# Patient Record
Sex: Male | Born: 1955 | Race: White | Hispanic: No | Smoking: Former smoker
Health system: Southern US, Community
[De-identification: ages and names within clinical notes are randomized; demographics above are authoritative.]

## PROBLEM LIST (undated history)

## (undated) DIAGNOSIS — I2510 Atherosclerotic heart disease of native coronary artery without angina pectoris: Secondary | ICD-10-CM

## (undated) DIAGNOSIS — G4730 Sleep apnea, unspecified: Secondary | ICD-10-CM

## (undated) DIAGNOSIS — I509 Heart failure, unspecified: Secondary | ICD-10-CM

## (undated) DIAGNOSIS — I10 Essential (primary) hypertension: Secondary | ICD-10-CM

## (undated) DIAGNOSIS — E7800 Pure hypercholesterolemia, unspecified: Secondary | ICD-10-CM

## (undated) DIAGNOSIS — K2270 Barrett's esophagus without dysplasia: Secondary | ICD-10-CM

## (undated) DIAGNOSIS — F32A Depression, unspecified: Secondary | ICD-10-CM

## (undated) DIAGNOSIS — E039 Hypothyroidism, unspecified: Secondary | ICD-10-CM

## (undated) DIAGNOSIS — F329 Major depressive disorder, single episode, unspecified: Secondary | ICD-10-CM

## (undated) HISTORY — DX: Depression, unspecified: F32.A

## (undated) HISTORY — PX: TONSILLECTOMY: SUR1361

## (undated) HISTORY — DX: Essential (primary) hypertension: I10

## (undated) HISTORY — PX: COLONOSCOPY WITH ESOPHAGOGASTRODUODENOSCOPY (EGD): SHX5779

## (undated) HISTORY — PX: MEDIAL PARTIAL KNEE REPLACEMENT: SHX5965

## (undated) HISTORY — DX: Barrett's esophagus without dysplasia: K22.70

## (undated) HISTORY — PX: OTHER SURGICAL HISTORY: SHX169

## (undated) HISTORY — PX: COLONOSCOPY: SHX174

## (undated) HISTORY — DX: Pure hypercholesterolemia, unspecified: E78.00

## (undated) HISTORY — DX: Major depressive disorder, single episode, unspecified: F32.9

## (undated) HISTORY — DX: Hypothyroidism, unspecified: E03.9

---

## 2010-10-18 ENCOUNTER — Encounter: Payer: Self-pay | Admitting: Internal Medicine

## 2018-04-21 ENCOUNTER — Encounter (INDEPENDENT_AMBULATORY_CARE_PROVIDER_SITE_OTHER): Payer: Self-pay | Admitting: Internal Medicine

## 2018-04-21 ENCOUNTER — Ambulatory Visit (INDEPENDENT_AMBULATORY_CARE_PROVIDER_SITE_OTHER): Payer: BC Managed Care – PPO | Admitting: Internal Medicine

## 2018-04-21 ENCOUNTER — Encounter (INDEPENDENT_AMBULATORY_CARE_PROVIDER_SITE_OTHER): Payer: Self-pay | Admitting: *Deleted

## 2018-04-21 ENCOUNTER — Telehealth (INDEPENDENT_AMBULATORY_CARE_PROVIDER_SITE_OTHER): Payer: Self-pay | Admitting: *Deleted

## 2018-04-21 VITALS — BP 160/78 | HR 56 | Temp 97.9°F | Ht 70.0 in | Wt 253.8 lb

## 2018-04-21 DIAGNOSIS — K625 Hemorrhage of anus and rectum: Secondary | ICD-10-CM | POA: Insufficient documentation

## 2018-04-21 DIAGNOSIS — K2270 Barrett's esophagus without dysplasia: Secondary | ICD-10-CM | POA: Insufficient documentation

## 2018-04-21 DIAGNOSIS — Z86010 Personal history of colonic polyps: Secondary | ICD-10-CM | POA: Insufficient documentation

## 2018-04-21 HISTORY — DX: Barrett's esophagus without dysplasia: K22.70

## 2018-04-21 NOTE — Telephone Encounter (Signed)
Patient needs trilyte 

## 2018-04-21 NOTE — Patient Instructions (Signed)
The risks of bleeding, perforation and infection were reviewed with patient.  

## 2018-04-21 NOTE — Progress Notes (Signed)
   Subjective:    Patient ID: Tony Alvarez, male    DOB: Jun 07, 1956, 62 y.o.   MRN: [id number]      PCP Servando Salina FNP-C HPI Referred by [facility] for rectal bleeding. States he had rectal bleeding x 6 day and now has resolved. Saw the blood in the commode and on the toilet tissue. Denies any abdominal pain.  No change in his bowel habits. Appetite is good. No weight loss. No family hx of colon cancer.  Last colonoscopy was in 10/18/2010. Dr. Donnie Mesa. Diverticulosis. Three 3-24mm polyps in sigmoid colon.  One 4 mm polyp in rectum. One 50mm polyp in rectum.   Adenomatous. Internal hemorrhoids. Repeat  2016. 10/18/2010 EGD: Irregular z line, gastric polyp, gastric erythema. Barrett's  Married, no children. 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 Barrett's esophagus 04/21/2018  . Depression   . High cholesterol   . Hypertension   . Hypothyroid       No Known Allergies  Current Outpatient Medications on File Prior to Visit  Medication Sig Dispense Refill  . amLODipine (NORVASC) 5 MG tablet Take 5 mg by mouth daily.    Marland Kitchen buPROPion (WELLBUTRIN XL) 150 MG 24 hr tablet Take 150 mg by mouth daily.    Marland Kitchen escitalopram (LEXAPRO) 20 MG tablet Take 20 mg by mouth daily.    Marland Kitchen levothyroxine (SYNTHROID, LEVOTHROID) 125 MCG tablet Take 125 mcg by mouth daily before breakfast.    . losartan (COZAAR) 100 MG tablet Take 100 mg by mouth daily.    . pravastatin (PRAVACHOL) 20 MG tablet Take 20 mg by mouth daily.     No current facility-administered medications on file prior to visit.              Objective:   Physical Exam Blood pressure (!) 160/78, pulse (!) 56, temperature 97.9 F (36.6 C), height 5\' 10"  (1.778 m), weight 253 lb 12.8 oz (115.1 kg). Alert and oriented. Skin warm and dry. Oral mucosa is moist.   . Sclera anicteric, conjunctivae is pink. Thyroid not enlarged. No cervical  lymphadenopathy. Lungs clear. Heart regular rate and rhythm.  Abdomen is soft. Bowel sounds are positive. No hepatomegaly. No abdominal masses felt. No tenderness.  No edema to lower extremities.          Assessment & Plan:  Colon polyps adenomas per records. Needs surveillance. Rectal bleeding: Hx of Barrett's esophagus: Needs surveillance.

## 2018-04-22 MED ORDER — PEG 3350-KCL-NA BICARB-NACL 420 G PO SOLR: 4000.0000 mL | Freq: Once | ORAL | 0 refills | Status: AC

## 2018-04-28 ENCOUNTER — Ambulatory Visit (INDEPENDENT_AMBULATORY_CARE_PROVIDER_SITE_OTHER): Payer: BC Managed Care – PPO | Admitting: Internal Medicine

## 2018-05-24 ENCOUNTER — Encounter (HOSPITAL_COMMUNITY)
Admission: RE | Disposition: A | Discharge status: Home / Self Care | Payer: Self-pay | Source: Ambulatory Visit | Attending: Internal Medicine

## 2018-05-24 ENCOUNTER — Ambulatory Visit (HOSPITAL_COMMUNITY)
Admission: RE | Admit: 2018-05-24 | Discharge: 2018-05-24 | Disposition: A | Discharge status: Home / Self Care | Payer: BC Managed Care – PPO | Source: Ambulatory Visit | Attending: Internal Medicine | Admitting: Internal Medicine

## 2018-05-24 ENCOUNTER — Encounter (HOSPITAL_COMMUNITY): Payer: Self-pay | Admitting: *Deleted

## 2018-05-24 ENCOUNTER — Other Ambulatory Visit: Payer: Self-pay

## 2018-05-24 DIAGNOSIS — G4730 Sleep apnea, unspecified: Secondary | ICD-10-CM | POA: Insufficient documentation

## 2018-05-24 DIAGNOSIS — K635 Polyp of colon: Secondary | ICD-10-CM | POA: Insufficient documentation

## 2018-05-24 DIAGNOSIS — K2270 Barrett's esophagus without dysplasia: Secondary | ICD-10-CM | POA: Diagnosis not present

## 2018-05-24 DIAGNOSIS — K2970 Gastritis, unspecified, without bleeding: Secondary | ICD-10-CM | POA: Diagnosis not present

## 2018-05-24 DIAGNOSIS — F329 Major depressive disorder, single episode, unspecified: Secondary | ICD-10-CM | POA: Diagnosis not present

## 2018-05-24 DIAGNOSIS — Z79890 Hormone replacement therapy (postmenopausal): Secondary | ICD-10-CM | POA: Diagnosis not present

## 2018-05-24 DIAGNOSIS — K210 Gastro-esophageal reflux disease with esophagitis: Secondary | ICD-10-CM | POA: Diagnosis not present

## 2018-05-24 DIAGNOSIS — K317 Polyp of stomach and duodenum: Secondary | ICD-10-CM | POA: Insufficient documentation

## 2018-05-24 DIAGNOSIS — D125 Benign neoplasm of sigmoid colon: Secondary | ICD-10-CM | POA: Diagnosis not present

## 2018-05-24 DIAGNOSIS — I10 Essential (primary) hypertension: Secondary | ICD-10-CM | POA: Insufficient documentation

## 2018-05-24 DIAGNOSIS — E039 Hypothyroidism, unspecified: Secondary | ICD-10-CM | POA: Insufficient documentation

## 2018-05-24 DIAGNOSIS — Z13810 Encounter for screening for upper gastrointestinal disorder: Secondary | ICD-10-CM | POA: Diagnosis present

## 2018-05-24 DIAGNOSIS — Z8719 Personal history of other diseases of the digestive system: Secondary | ICD-10-CM | POA: Insufficient documentation

## 2018-05-24 DIAGNOSIS — Z79899 Other long term (current) drug therapy: Secondary | ICD-10-CM | POA: Insufficient documentation

## 2018-05-24 DIAGNOSIS — Z860100 Personal history of colon polyps, unspecified: Secondary | ICD-10-CM

## 2018-05-24 DIAGNOSIS — K5140 Inflammatory polyps of colon without complications: Secondary | ICD-10-CM | POA: Insufficient documentation

## 2018-05-24 DIAGNOSIS — K625 Hemorrhage of anus and rectum: Secondary | ICD-10-CM

## 2018-05-24 DIAGNOSIS — K228 Other specified diseases of esophagus: Secondary | ICD-10-CM | POA: Diagnosis not present

## 2018-05-24 DIAGNOSIS — K644 Residual hemorrhoidal skin tags: Secondary | ICD-10-CM | POA: Insufficient documentation

## 2018-05-24 DIAGNOSIS — F17210 Nicotine dependence, cigarettes, uncomplicated: Secondary | ICD-10-CM | POA: Insufficient documentation

## 2018-05-24 DIAGNOSIS — E7800 Pure hypercholesterolemia, unspecified: Secondary | ICD-10-CM | POA: Diagnosis not present

## 2018-05-24 HISTORY — PX: POLYPECTOMY: SHX5525

## 2018-05-24 HISTORY — DX: Sleep apnea, unspecified: G47.30

## 2018-05-24 HISTORY — PX: BIOPSY: SHX5522

## 2018-05-24 HISTORY — PX: COLONOSCOPY: SHX5424

## 2018-05-24 HISTORY — PX: ESOPHAGOGASTRODUODENOSCOPY: SHX5428

## 2018-05-24 SURGERY — COLONOSCOPY
Anesthesia: Moderate Sedation

## 2018-05-24 MED ORDER — MIDAZOLAM HCL 5 MG/5ML IJ SOLN
INTRAMUSCULAR | Status: DC | PRN
  Administered 2018-05-24: 2 mg via INTRAVENOUS
  Administered 2018-05-24: 3 mg via INTRAVENOUS
  Administered 2018-05-24: 2 mg via INTRAVENOUS

## 2018-05-24 MED ORDER — MIDAZOLAM HCL 5 MG/5ML IJ SOLN
INTRAMUSCULAR | Status: AC
  Filled 2018-05-24: qty 10

## 2018-05-24 MED ORDER — STERILE WATER FOR IRRIGATION IR SOLN
Status: DC | PRN
  Administered 2018-05-24: 09:00:00

## 2018-05-24 MED ORDER — SODIUM CHLORIDE 0.9 % IV SOLN
INTRAVENOUS | Status: DC
  Administered 2018-05-24: 08:00:00 via INTRAVENOUS

## 2018-05-24 MED ORDER — LIDOCAINE VISCOUS HCL 2 % MT SOLN
OROMUCOSAL | Status: AC
  Filled 2018-05-24: qty 15

## 2018-05-24 MED ORDER — LIDOCAINE VISCOUS HCL 2 % MT SOLN
OROMUCOSAL | Status: DC | PRN
  Administered 2018-05-24: 4 mL via OROMUCOSAL

## 2018-05-24 MED ORDER — PANTOPRAZOLE SODIUM 40 MG PO TBEC: 40.0000 mg | DELAYED_RELEASE_TABLET | Freq: Every day | ORAL | 5 refills | Status: DC

## 2018-05-24 MED ORDER — MEPERIDINE HCL 50 MG/ML IJ SOLN
INTRAMUSCULAR | Status: DC | PRN
  Administered 2018-05-24 (×2): 25 mg via INTRAVENOUS

## 2018-05-24 MED ORDER — MEPERIDINE HCL 50 MG/ML IJ SOLN
INTRAMUSCULAR | Status: AC
  Filled 2018-05-24: qty 1

## 2018-05-24 NOTE — Op Note (Addendum)
[facility] Patient Name: Tony Alvarez Procedure Date: 05/24/2018 8:31 AM MRN: [id number] Date of Birth: [birth year] Attending MD: Hildred Laser , MD CSN: [id number] Age: 62 Admit Type: Outpatient Procedure:                Upper GI endoscopy Indications:              Follow-up of Barrett's esophagus Providers:                Hildred Laser, MD, Otis Peak B. Sharon Seller, RN, Nelma Rothman, Technician Referring MD:             Armstead Peaks, FNP Medicines:                Lidocaine spray, Meperidine 50 mg IV, Midazolam 7                            mg IV Complications:            No immediate complications. Estimated Blood Loss:     Estimated blood loss was minimal. Procedure:                Pre-Anesthesia Assessment:                           - Prior to the procedure, a History and Physical                            was performed, and patient medications and                            allergies were reviewed. The patient's tolerance of                            previous anesthesia was also reviewed. The risks                            and benefits of the procedure and the sedation                            options and risks were discussed with the patient.                            All questions were answered, and informed consent                            was obtained. Prior Anticoagulants: The patient has                            taken no previous anticoagulant or antiplatelet                            agents. ASA Grade Assessment: II - A patient with  mild systemic disease. After reviewing the risks                            and benefits, the patient was deemed in                            satisfactory condition to undergo the procedure.                           After obtaining informed consent, the endoscope was                            passed under direct vision. Throughout the                            procedure, the  patient's blood pressure, pulse, and                            oxygen saturations were monitored continuously. The                            GIF-H190 ([id number]) was introduced through the                            mouth, and advanced to the second part of duodenum.                            The upper GI endoscopy was accomplished without                            difficulty. The patient tolerated the procedure                            well. Scope In: 6:21:30 AM Scope Out: 9:01:10 AM Total Procedure Duration: 0 hours 15 minutes 2 seconds  Findings:      The upper third of the esophagus and middle third of the esophagus were       normal.      LA Grade B (one or more mucosal breaks greater than 5 mm, not extending       between the tops of two mucosal folds) esophagitis was found 43 to 45 cm       from the incisors.      The Z-line was irregular and was found 45 cm from the incisors. Biopsies       were taken with a cold forceps for histology. The pathology specimen was       placed into Bottle Number 2.      Three small sessile polyps were found in the gastric body and in the       gastric antrum. Biopsies were taken with a cold forceps for histology.       The pathology specimen was placed into Bottle Number 1.      Patchy mild inflammation characterized by congestion (edema) and       erythema was found in the gastric body and in the gastric antrum.      The exam of  the stomach was otherwise normal.      The duodenal bulb and second portion of the duodenum were normal. Impression:               - Normal upper third of esophagus and middle third                            of esophagus.                           - LA Grade B reflux esophagitis.                           - Z-line irregular with edema and erythema at 45 cm                            from the incisors. Biopsied.                           - Three gastric polyps. Biopsied.                           - Gastritis.                            - Normal duodenal bulb and second portion of the                            duodenum. Moderate Sedation:      Moderate (conscious) sedation was administered by the endoscopy nurse       and supervised by the endoscopist. The following parameters were       monitored: oxygen saturation, heart rate, blood pressure, CO2       capnography and response to care. Total physician intraservice time was       22 minutes. Recommendation:           - Patient has a contact number available for                            emergencies. The signs and symptoms of potential                            delayed complications were discussed with the                            patient. Return to normal activities tomorrow.                            Written discharge instructions were provided to the                            patient.                           - Resume previous diet today.                           -  Continue present medications.                           - Use Protonix (pantoprazole) 40 mg PO daily today.                           - Await pathology results.                           - Anti-reflux measures. Procedure Code(s):        --- Professional ---                           [phone], Esophagogastroduodenoscopy, flexible,                            transoral; with biopsy, single or multiple                           G0500, Moderate sedation services provided by the                            same physician or other qualified health care                            professional performing a gastrointestinal                            endoscopic service that sedation supports,                            requiring the presence of an independent trained                            observer to assist in the monitoring of the                            patient's level of consciousness and physiological                            status; initial 15 minutes of intra-service time;                             patient age 77 years or older (additional time may                            be reported with [phone], as appropriate) Diagnosis Code(s):        --- Professional ---                           K22.70, Barrett's esophagus without dysplasia                           K21.0, Gastro-esophageal reflux disease with  esophagitis                           K22.8, Other specified diseases of esophagus                           K31.7, Polyp of stomach and duodenum                           K29.70, Gastritis, unspecified, without bleeding CPT copyright 2017 American Medical Association. All rights reserved. The codes documented in this report are preliminary and upon coder review may  be revised to meet current compliance requirements. Hildred Laser, MD Hildred Laser, MD 05/24/2018 9:49:36 AM This report has been signed electronically. Number of Addenda: 0

## 2018-05-24 NOTE — H&P (Signed)
Tony Alvarez is an 62 y.o. male.   Chief Complaint: Patient is here for EGD and colonoscopy. HPI: Patient is 62 year old Caucasian male with history of Barrett's esophagus who is here for surveillance EGD.  He presently does not take any medicine for acid suppression.  He is watching his diet.  He denies nausea vomiting or dysphagia.  Last month he had 6 days of rectal bleeding occurring with his bowel movement and without his bowel movements.  He passed moderate amount of fresh blood each time.  He did not have diarrhea no constipation.  He is back to having normal stools.  Last colonoscopy was in December 2011 with removal of 3 small polyps.  He has not had a follow-up exam. Family history is negative for CRC.  Past Medical History:  Diagnosis Date  . Barrett's esophagus 04/21/2018  . Depression   . High cholesterol   . Hypertension   . Hypothyroid   . Sleep apnea     Past Surgical History:  Procedure Laterality Date  . COLONOSCOPY    . COLONOSCOPY WITH ESOPHAGOGASTRODUODENOSCOPY (EGD)    . MEDIAL PARTIAL KNEE REPLACEMENT     2015  . rt hand injury    . shoulder surgery for bursitis    . TONSILLECTOMY      Family History  Problem Relation Age of Onset  . Colon cancer Neg Hx    Social History:  reports that he has been smoking cigarettes.  He has a 10.00 pack-year smoking history. He has never used smokeless tobacco. He reports that he does not drink alcohol or use drugs.  Allergies: No Known Allergies  Medications Prior to Admission  Medication Sig Dispense Refill  . amLODipine (NORVASC) 5 MG tablet Take 5 mg by mouth daily.    Marland Kitchen buPROPion (WELLBUTRIN SR) 150 MG 12 hr tablet Take 150-300 mg by mouth See admin instructions. Take 300mg  by mouth in the morning and 150mg  at lunch  0  . escitalopram (LEXAPRO) 20 MG tablet Take 20 mg by mouth daily.    Marland Kitchen levothyroxine (SYNTHROID, LEVOTHROID) 125 MCG tablet Take 125 mcg by mouth daily before breakfast.    . losartan (COZAAR) 100 MG  tablet Take 100 mg by mouth daily.    . pravastatin (PRAVACHOL) 20 MG tablet Take 20 mg by mouth daily.      No results found for this or any previous visit (from the past 48 hour(s)). No results found.  ROS  Blood pressure 136/77, pulse 69, temperature 98.1 F (36.7 C), temperature source Oral, resp. rate 13, height 5\' 10"  (1.778 m), weight 250 lb (113.4 kg), SpO2 96 %. Physical Exam  Constitutional: He appears well-developed and well-nourished.  HENT:  Mouth/Throat: Oropharynx is clear and moist.  Eyes: Conjunctivae are normal. No scleral icterus.  Neck: No thyromegaly present.  Cardiovascular: Normal rate, regular rhythm and normal heart sounds.  No murmur heard. Respiratory: Effort normal and breath sounds normal.  GI: Soft. He exhibits no distension and no mass. There is no tenderness.  Musculoskeletal: He exhibits no edema.  Lymphadenopathy:    He has no cervical adenopathy.  Neurological: He is alert.  Skin: Skin is warm and dry.     Assessment/Plan History of Barrett's esophagus. Rectal bleeding.  History of colonic polyps.  Path not available. Surveillance EGD and diagnostic colonoscopy.  Hildred Laser, MD 05/24/2018, 8:35 AM

## 2018-05-24 NOTE — Op Note (Signed)
[facility] Patient Name: Tony Alvarez Procedure Date: 05/24/2018 9:06 AM MRN: [id number] Date of Birth: February 14, 1956 Attending MD: Hildred Laser , MD CSN: [id number] Age: 62 Admit Type: Outpatient Procedure:                Colonoscopy Indications:              Rectal bleeding Providers:                Hildred Laser, MD, Jeanann Lewandowsky. Sharon Seller, RN, Nelma Rothman, Technician Referring MD:             Maple Hudson, FNP Medicines:                None other than what was given for EGD. Complications:            No immediate complications. Estimated Blood Loss:     Estimated blood loss was minimal. Procedure:                Pre-Anesthesia Assessment:                           - Prior to the procedure, a History and Physical                            was performed, and patient medications and                            allergies were reviewed. The patient's tolerance of                            previous anesthesia was also reviewed. The risks                            and benefits of the procedure and the sedation                            options and risks were discussed with the patient.                            All questions were answered, and informed consent                            was obtained. Prior Anticoagulants: The patient has                            taken no previous anticoagulant or antiplatelet                            agents. ASA Grade Assessment: II - A patient with                            mild systemic disease. After reviewing the risks  and benefits, the patient was deemed in                            satisfactory condition to undergo the procedure.                           After obtaining informed consent, the colonoscope                            was passed under direct vision. Throughout the                            procedure, the patient's blood pressure, pulse, and   oxygen saturations were monitored continuously. The                            PCF-H190DL ([id number]) scope was introduced through                            the anus and advanced to the the cecum, identified                            by appendiceal orifice and ileocecal valve. The                            colonoscopy was performed without difficulty. The                            patient tolerated the procedure well. The quality                            of the bowel preparation was excellent. The                            ileocecal valve, appendiceal orifice, and rectum                            were photographed. Scope In: 9:08:26 AM Scope Out: 9:33:42 AM Scope Withdrawal Time: 0 hours 17 minutes 37 seconds  Total Procedure Duration: 0 hours 25 minutes 16 seconds  Findings:      The perianal and digital rectal examinations were normal.      A small polyp was found in the distal sigmoid colon. The polyp was       sessile. Biopsies were taken with a cold forceps for histology. The       pathology specimen was placed into Bottle Number 3.      The exam was otherwise normal throughout the examined colon.      External hemorrhoids were found during retroflexion. The hemorrhoids       were small. Impression:               - One small polyp in the distal sigmoid colon.                            Biopsied.                           -  External hemorrhoids. Moderate Sedation:      Moderate (conscious) sedation was administered by the endoscopy nurse       and supervised by the endoscopist. The following parameters were       monitored: oxygen saturation, heart rate, blood pressure, CO2       capnography and response to care. Total physician intraservice time was       34 minutes. Recommendation:           - Patient has a contact number available for                            emergencies. The signs and symptoms of potential                            delayed complications were discussed  with the                            patient. Return to normal activities tomorrow.                            Written discharge instructions were provided to the                            patient.                           - Resume previous diet today.                           - Continue present medications.                           - Await pathology results.                           - Repeat colonoscopy is recommended. The                            colonoscopy date will be determined after pathology                            results from today's exam become available for                            review. Procedure Code(s):        --- Professional ---                           [phone], Colonoscopy, flexible; with biopsy, single                            or multiple                           G0500, Moderate sedation services provided by the  same physician or other qualified health care                            professional performing a gastrointestinal                            endoscopic service that sedation supports,                            requiring the presence of an independent trained                            observer to assist in the monitoring of the                            patient's level of consciousness and physiological                            status; initial 15 minutes of intra-service time;                            patient age 87 years or older (additional time may                            be reported with [phone], as appropriate)                           [phone], Moderate sedation services provided by the                            same physician or other qualified health care                            professional performing the diagnostic or                            therapeutic service that the sedation supports,                            requiring the presence of an independent trained                             observer to assist in the monitoring of the                            patient's level of consciousness and physiological                            status; each additional 15 minutes intraservice                            time (List separately in addition to code for  primary service) Diagnosis Code(s):        --- Professional ---                           D12.5, Benign neoplasm of sigmoid colon                           K64.4, Residual hemorrhoidal skin tags                           K62.5, Hemorrhage of anus and rectum CPT copyright 2017 American Medical Association. All rights reserved. The codes documented in this report are preliminary and upon coder review may  be revised to meet current compliance requirements. Hildred Laser, MD Hildred Laser, MD 05/24/2018 9:53:04 AM This report has been signed electronically. Number of Addenda: 0

## 2018-05-24 NOTE — Discharge Instructions (Signed)
Hemorrhoids °Hemorrhoids are swollen veins in and around the rectum or anus. There are two types of hemorrhoids: °· Internal hemorrhoids. These occur in the veins that are just inside the rectum. They may poke through to the outside and become irritated and painful. °· External hemorrhoids. These occur in the veins that are outside of the anus and can be felt as a painful swelling or hard lump near the anus. ° °Most hemorrhoids do not cause serious problems, and they can be managed with home treatments such as diet and lifestyle changes. If home treatments do not help your symptoms, procedures can be done to shrink or remove the hemorrhoids. °What are the causes? °This condition is caused by increased pressure in the anal area. This pressure may result from various things, including: °· Constipation. °· Straining to have a bowel movement. °· Diarrhea. °· Pregnancy. °· Obesity. °· Sitting for long periods of time. °· Heavy lifting or other activity that causes you to strain. °· Anal sex. ° °What are the signs or symptoms? °Symptoms of this condition include: °· Pain. °· Anal itching or irritation. °· Rectal bleeding. °· Leakage of stool (feces). °· Anal swelling. °· One or more lumps around the anus. ° °How is this diagnosed? °This condition can often be diagnosed through a visual exam. Other exams or tests may also be done, such as: °· Examination of the rectal area with a gloved hand (digital rectal exam). °· Examination of the anal canal using a small tube (anoscope). °· A blood test, if you have lost a significant amount of blood. °· A test to look inside the colon (sigmoidoscopy or colonoscopy). ° °How is this treated? °This condition can usually be treated at home. However, various procedures may be done if dietary changes, lifestyle changes, and other home treatments do not help your symptoms. These procedures can help make the hemorrhoids smaller or remove them completely. Some of these procedures involve  surgery, and others do not. Common procedures include: °· Rubber band ligation. Rubber bands are placed at the base of the hemorrhoids to cut off the blood supply to them. °· Sclerotherapy. Medicine is injected into the hemorrhoids to shrink them. °· Infrared coagulation. A type of light energy is used to get rid of the hemorrhoids. °· Hemorrhoidectomy surgery. The hemorrhoids are surgically removed, and the veins that supply them are tied off. °· Stapled hemorrhoidopexy surgery. A circular stapling device is used to remove the hemorrhoids and use staples to cut off the blood supply to them. ° °Follow these instructions at home: °Eating and drinking °· Eat foods that have a lot of fiber in them, such as whole grains, beans, nuts, fruits, and vegetables. Ask your health care provider about taking products that have added fiber (fiber supplements). °· Drink enough fluid to keep your urine clear or pale yellow. °Managing pain and swelling °· Take warm sitz baths for 20 minutes, 3-4 times a day to ease pain and discomfort. °· If directed, apply ice to the affected area. Using ice packs between sitz baths may be helpful. °? Put ice in a plastic bag. °? Place a towel between your skin and the bag. °? Leave the ice on for 20 minutes, 2-3 times a day. °General instructions °· Take over-the-counter and prescription medicines only as told by your health care provider. °· Use medicated creams or suppositories as told. °· Exercise regularly. °· Go to the bathroom when you have the urge to have a bowel movement. Do not wait. °·   Avoid straining to have bowel movements.  Keep the anal area dry and clean. Use wet toilet paper or moist towelettes after a bowel movement.  Do not sit on the toilet for long periods of time. This increases blood pooling and pain. Contact a health care provider if:  You have increasing pain and swelling that are not controlled by treatment or medicine.  You have uncontrolled bleeding.  You  have difficulty having a bowel movement, or you are unable to have a bowel movement.  You have pain or inflammation outside the area of the hemorrhoids. This information is not intended to replace advice given to you by your health care provider. Make sure you discuss any questions you have with your health care provider. Document Released: 10/24/2000 Document Revised: 03/26/2016 Document Reviewed: 07/11/2015 Elsevier Interactive Patient Education  2018 [employer]. Esophagogastroduodenoscopy Esophagogastroduodenoscopy (EGD) is a procedure to examine the lining of the esophagus, stomach, and first part of the small intestine (duodenum). This procedure is done to check for problems such as inflammation, bleeding, ulcers, or growths. During this procedure, a long, flexible, lighted tube with a camera attached (endoscope) is inserted down the throat. Tell a health care provider about:  Any allergies you have.  All medicines you are taking, including vitamins, herbs, eye drops, creams, and over-the-counter medicines.  Any problems you or family members have had with anesthetic medicines.  Any blood disorders you have.  Any surgeries you have had.  Any medical conditions you have.  Whether you are pregnant or may be pregnant. What are the risks? Generally, this is a safe procedure. However, problems may occur, including:  Infection.  Bleeding.  A tear (perforation) in the esophagus, stomach, or duodenum.  Trouble breathing.  Excessive sweating.  Spasms of the larynx.  A slowed heartbeat.  Low blood pressure.  What happens before the procedure?  Follow instructions from your health care provider about eating or drinking restrictions.  Ask your health care provider about: ? Changing or stopping your regular medicines. This is especially important if you are taking diabetes medicines or blood thinners. ? Taking medicines such as aspirin and ibuprofen. These medicines can  thin your blood. Do not take these medicines before your procedure if your health care provider instructs you not to.  Plan to have someone take you home after the procedure.  If you wear dentures, be ready to remove them before the procedure. What happens during the procedure?  To reduce your risk of infection, your health care team will wash or sanitize their hands.  An IV tube will be put in a vein in your hand or arm. You will get medicines and fluids through this tube.  You will be given one or more of the following: ? A medicine to help you relax (sedative). ? A medicine to numb the area (local anesthetic). This medicine may be sprayed into your throat. It will make you feel more comfortable and keep you from gagging or coughing during the procedure. ? A medicine for pain.  A mouth guard may be placed in your mouth to protect your teeth and to keep you from biting on the endoscope.  You will be asked to lie on your left side.  The endoscope will be lowered down your throat into your esophagus, stomach, and duodenum.  Air will be put into the endoscope. This will help your health care provider see better.  The lining of your esophagus, stomach, and duodenum will be examined.  Your health  care provider may: ? Take a tissue sample so it can be looked at in a lab (biopsy). ? Remove growths. ? Remove objects (foreign bodies) that are stuck. ? Treat any bleeding with medicines or other devices that stop tissue from bleeding. ? Widen (dilate) or stretch narrowed areas of your esophagus and stomach.  The endoscope will be taken out. The procedure may vary among health care providers and hospitals. What happens after the procedure?  Your blood pressure, heart rate, breathing rate, and blood oxygen level will be monitored often until the medicines you were given have worn off.  Do not eat or drink anything until the numbing medicine has worn off and your gag reflex has  returned. This information is not intended to replace advice given to you by your health care provider. Make sure you discuss any questions you have with your health care provider. Document Released: 02/27/2005 Document Revised: 04/03/2016 Document Reviewed: 09/20/2015 Elsevier Interactive Patient Education  2018 [employer]. Colonoscopy, Adult, Care After This sheet gives you information about how to care for yourself after your procedure. Your health care provider may also give you more specific instructions. If you have problems or questions, contact your health care provider. What can I expect after the procedure? After the procedure, it is common to have:  A small amount of blood in your stool for 24 hours after the procedure.  Some gas.  Mild abdominal cramping or bloating.  Follow these instructions at home: General instructions   For the first 24 hours after the procedure: ? Do not drive or use machinery. ? Do not sign important documents. ? Do not drink alcohol. ? Do your regular daily activities at a slower pace than normal. ? Eat soft, easy-to-digest foods. ? Rest often.  Take over-the-counter or prescription medicines only as told by your health care provider.  It is up to you to get the results of your procedure. Ask your health care provider, or the department performing the procedure, when your results will be ready. Relieving cramping and bloating  Try walking around when you have cramps or feel bloated.  Apply heat to your abdomen as told by your health care provider. Use a heat source that your health care provider recommends, such as a moist heat pack or a heating pad. ? Place a towel between your skin and the heat source. ? Leave the heat on for 20-30 minutes. ? Remove the heat if your skin turns bright red. This is especially important if you are unable to feel pain, heat, or cold. You may have a greater risk of getting burned. Eating and drinking  Drink  enough fluid to keep your urine clear or pale yellow.  Resume your normal diet as instructed by your health care provider. Avoid heavy or fried foods that are hard to digest.  Avoid drinking alcohol for as long as instructed by your health care provider. Contact a health care provider if:  You have blood in your stool 2-3 days after the procedure. Get help right away if:  You have more than a small spotting of blood in your stool.  You pass large blood clots in your stool.  Your abdomen is swollen.  You have nausea or vomiting.  You have a fever.  You have increasing abdominal pain that is not relieved with medicine. This information is not intended to replace advice given to you by your health care provider. Make sure you discuss any questions you have with your health  care provider. Document Released: 06/10/2004 Document Revised: 07/21/2016 Document Reviewed: 01/08/2016 Elsevier Interactive Patient Education  2018 [employer]. No aspirin or NSAIDs for 24 hours. Resume usual medications as before. Pantoprazole 40 mg by mouth 30 minutes before breakfast daily. Anti-reflux measures. No driving for 24 hours. Physician will call with biopsy results.

## 2018-05-28 ENCOUNTER — Encounter (HOSPITAL_COMMUNITY): Payer: Self-pay | Admitting: Internal Medicine

## 2019-01-26 ENCOUNTER — Encounter (HOSPITAL_COMMUNITY): Payer: Self-pay

## 2019-01-26 ENCOUNTER — Inpatient Hospital Stay (HOSPITAL_COMMUNITY)
Admission: EM | Admit: 2019-01-26 | Discharge: 2019-01-30 | DRG: 246 | Disposition: A | Discharge status: Home Health | Payer: BC Managed Care – PPO | Attending: Cardiovascular Disease | Admitting: Cardiovascular Disease

## 2019-01-26 ENCOUNTER — Encounter (HOSPITAL_COMMUNITY)
Admission: EM | Disposition: A | Discharge status: Home Health | Payer: Self-pay | Source: Home / Self Care | Attending: Cardiovascular Disease

## 2019-01-26 ENCOUNTER — Other Ambulatory Visit: Payer: Self-pay

## 2019-01-26 ENCOUNTER — Emergency Department (HOSPITAL_COMMUNITY): Payer: BC Managed Care – PPO

## 2019-01-26 DIAGNOSIS — I2102 ST elevation (STEMI) myocardial infarction involving left anterior descending coronary artery: Principal | ICD-10-CM | POA: Diagnosis present

## 2019-01-26 DIAGNOSIS — I5023 Acute on chronic systolic (congestive) heart failure: Secondary | ICD-10-CM

## 2019-01-26 DIAGNOSIS — K2270 Barrett's esophagus without dysplasia: Secondary | ICD-10-CM | POA: Diagnosis not present

## 2019-01-26 DIAGNOSIS — I10 Essential (primary) hypertension: Secondary | ICD-10-CM

## 2019-01-26 DIAGNOSIS — I2510 Atherosclerotic heart disease of native coronary artery without angina pectoris: Secondary | ICD-10-CM | POA: Diagnosis not present

## 2019-01-26 DIAGNOSIS — E782 Mixed hyperlipidemia: Secondary | ICD-10-CM | POA: Diagnosis present

## 2019-01-26 DIAGNOSIS — I5021 Acute systolic (congestive) heart failure: Secondary | ICD-10-CM | POA: Diagnosis present

## 2019-01-26 DIAGNOSIS — R634 Abnormal weight loss: Secondary | ICD-10-CM | POA: Diagnosis present

## 2019-01-26 DIAGNOSIS — F10231 Alcohol dependence with withdrawal delirium: Secondary | ICD-10-CM

## 2019-01-26 DIAGNOSIS — I2129 ST elevation (STEMI) myocardial infarction involving other sites: Secondary | ICD-10-CM

## 2019-01-26 DIAGNOSIS — I255 Ischemic cardiomyopathy: Secondary | ICD-10-CM

## 2019-01-26 DIAGNOSIS — Z7982 Long term (current) use of aspirin: Secondary | ICD-10-CM | POA: Diagnosis not present

## 2019-01-26 DIAGNOSIS — E039 Hypothyroidism, unspecified: Secondary | ICD-10-CM | POA: Diagnosis present

## 2019-01-26 DIAGNOSIS — E785 Hyperlipidemia, unspecified: Secondary | ICD-10-CM | POA: Diagnosis not present

## 2019-01-26 DIAGNOSIS — F1010 Alcohol abuse, uncomplicated: Secondary | ICD-10-CM

## 2019-01-26 DIAGNOSIS — I110 Hypertensive heart disease with heart failure: Secondary | ICD-10-CM | POA: Diagnosis present

## 2019-01-26 DIAGNOSIS — Z79899 Other long term (current) drug therapy: Secondary | ICD-10-CM | POA: Diagnosis not present

## 2019-01-26 DIAGNOSIS — G4730 Sleep apnea, unspecified: Secondary | ICD-10-CM | POA: Diagnosis present

## 2019-01-26 DIAGNOSIS — Z79890 Hormone replacement therapy (postmenopausal): Secondary | ICD-10-CM

## 2019-01-26 DIAGNOSIS — Z96659 Presence of unspecified artificial knee joint: Secondary | ICD-10-CM | POA: Diagnosis present

## 2019-01-26 DIAGNOSIS — R410 Disorientation, unspecified: Secondary | ICD-10-CM | POA: Diagnosis present

## 2019-01-26 DIAGNOSIS — R748 Abnormal levels of other serum enzymes: Secondary | ICD-10-CM

## 2019-01-26 DIAGNOSIS — Z634 Disappearance and death of family member: Secondary | ICD-10-CM | POA: Diagnosis not present

## 2019-01-26 DIAGNOSIS — Z86010 Personal history of colonic polyps: Secondary | ICD-10-CM

## 2019-01-26 DIAGNOSIS — F329 Major depressive disorder, single episode, unspecified: Secondary | ICD-10-CM | POA: Diagnosis present

## 2019-01-26 DIAGNOSIS — F10931 Alcohol use, unspecified with withdrawal delirium: Secondary | ICD-10-CM

## 2019-01-26 DIAGNOSIS — I213 ST elevation (STEMI) myocardial infarction of unspecified site: Secondary | ICD-10-CM | POA: Diagnosis not present

## 2019-01-26 DIAGNOSIS — F17210 Nicotine dependence, cigarettes, uncomplicated: Secondary | ICD-10-CM | POA: Diagnosis present

## 2019-01-26 DIAGNOSIS — M549 Dorsalgia, unspecified: Secondary | ICD-10-CM | POA: Diagnosis present

## 2019-01-26 DIAGNOSIS — G9340 Encephalopathy, unspecified: Secondary | ICD-10-CM | POA: Diagnosis not present

## 2019-01-26 HISTORY — PX: CORONARY/GRAFT ACUTE MI REVASCULARIZATION: CATH118305

## 2019-01-26 HISTORY — PX: LEFT HEART CATH AND CORONARY ANGIOGRAPHY: CATH118249

## 2019-01-26 HISTORY — PX: CORONARY STENT INTERVENTION: CATH118234

## 2019-01-26 LAB — CBC WITH DIFFERENTIAL/PLATELET
ABS IMMATURE GRANULOCYTES: 0.09 10*3/uL — AB (ref 0.00–0.07)
Basophils Absolute: 0.1 10*3/uL (ref 0.0–0.1)
Basophils Relative: 1 %
Eosinophils Absolute: 0.1 10*3/uL (ref 0.0–0.5)
Eosinophils Relative: 1 %
HCT: 50.8 % (ref 39.0–52.0)
Hemoglobin: 17 g/dL (ref 13.0–17.0)
Immature Granulocytes: 1 %
Lymphocytes Relative: 12 %
Lymphs Abs: 1.8 10*3/uL (ref 0.7–4.0)
MCH: 31 pg (ref 26.0–34.0)
MCHC: 33.5 g/dL (ref 30.0–36.0)
MCV: 92.5 fL (ref 80.0–100.0)
MONO ABS: 0.6 10*3/uL (ref 0.1–1.0)
Monocytes Relative: 4 %
NEUTROS ABS: 12.6 10*3/uL — AB (ref 1.7–7.7)
NEUTROS PCT: 81 %
PLATELETS: 231 10*3/uL (ref 150–400)
RBC: 5.49 MIL/uL (ref 4.22–5.81)
RDW: 13.5 % (ref 11.5–15.5)
WBC: 15.3 10*3/uL — ABNORMAL HIGH (ref 4.0–10.5)
nRBC: 0.1 % (ref 0.0–0.2)

## 2019-01-26 LAB — I-STAT TROPONIN, ED: Troponin i, poc: 3.16 ng/mL (ref 0.00–0.08)

## 2019-01-26 LAB — LIPID PANEL
CHOLESTEROL: 209 mg/dL — AB (ref 0–200)
HDL: 37 mg/dL — ABNORMAL LOW (ref >40)
LDL Cholesterol: 134 mg/dL — ABNORMAL HIGH (ref 0–99)
Total CHOL/HDL Ratio: 5.6 RATIO
Triglycerides: 190 mg/dL — ABNORMAL HIGH (ref <150)
VLDL: 38 mg/dL (ref 0–40)

## 2019-01-26 LAB — COMPREHENSIVE METABOLIC PANEL
ALT: 45 U/L — AB (ref 0–44)
ANION GAP: 12 (ref 5–15)
AST: 66 U/L — ABNORMAL HIGH (ref 15–41)
Albumin: 4.9 g/dL (ref 3.5–5.0)
Alkaline Phosphatase: 67 U/L (ref 38–126)
BUN: 14 mg/dL (ref 6–20)
CALCIUM: 10 mg/dL (ref 8.9–10.3)
CO2: 23 mmol/L (ref 22–32)
CREATININE: 1.27 mg/dL — AB (ref 0.61–1.24)
Chloride: 105 mmol/L (ref 98–111)
GFR calc non Af Amer: >60 mL/min (ref >60)
Glucose, Bld: 128 mg/dL — ABNORMAL HIGH (ref 70–99)
Potassium: 3.6 mmol/L (ref 3.5–5.1)
Sodium: 140 mmol/L (ref 135–145)
Total Bilirubin: 1.4 mg/dL — ABNORMAL HIGH (ref 0.3–1.2)
Total Protein: 8 g/dL (ref 6.5–8.1)

## 2019-01-26 LAB — TROPONIN I: Troponin I: 5.54 ng/mL (ref <0.03)

## 2019-01-26 LAB — PROTIME-INR
INR: 1 (ref 0.8–1.2)
Prothrombin Time: 13.3 seconds (ref 11.4–15.2)

## 2019-01-26 LAB — APTT: aPTT: 28 seconds (ref 24–36)

## 2019-01-26 SURGERY — CORONARY/GRAFT ACUTE MI REVASCULARIZATION
Anesthesia: LOCAL

## 2019-01-26 MED ORDER — SODIUM CHLORIDE 0.9% FLUSH: 3.0000 mL | INTRAVENOUS | Status: DC | PRN

## 2019-01-26 MED ORDER — NITROGLYCERIN 1 MG/10 ML FOR IR/CATH LAB
INTRA_ARTERIAL | Status: AC
  Filled 2019-01-26: qty 10

## 2019-01-26 MED ORDER — SODIUM CHLORIDE 0.9 % IV SOLN: 250.0000 mL | INTRAVENOUS | Status: DC | PRN

## 2019-01-26 MED ORDER — HEPARIN SODIUM (PORCINE) 1000 UNIT/ML IJ SOLN
INTRAMUSCULAR | Status: AC
  Filled 2019-01-26: qty 1

## 2019-01-26 MED ORDER — VERAPAMIL HCL 2.5 MG/ML IV SOLN
INTRAVENOUS | Status: AC
  Filled 2019-01-26: qty 2

## 2019-01-26 MED ORDER — ONDANSETRON HCL 4 MG/2ML IJ SOLN: 4.0000 mg | Freq: Four times a day (QID) | INTRAMUSCULAR | Status: DC | PRN

## 2019-01-26 MED ORDER — TICAGRELOR 90 MG PO TABS
ORAL_TABLET | ORAL | Status: DC | PRN
  Administered 2019-01-26: 180 mg via ORAL

## 2019-01-26 MED ORDER — LIDOCAINE HCL (PF) 1 % IJ SOLN
INTRAMUSCULAR | Status: DC | PRN
  Administered 2019-01-26: 2 mL

## 2019-01-26 MED ORDER — ASPIRIN 81 MG PO CHEW
324.0000 mg | CHEWABLE_TABLET | Freq: Once | ORAL | Status: AC
  Administered 2019-01-26: 324 mg via ORAL
  Filled 2019-01-26: qty 4

## 2019-01-26 MED ORDER — FENTANYL CITRATE (PF) 100 MCG/2ML IJ SOLN
INTRAMUSCULAR | Status: DC | PRN
  Administered 2019-01-26: 25 ug via INTRAVENOUS
  Administered 2019-01-26: 50 ug via INTRAVENOUS

## 2019-01-26 MED ORDER — BUPROPION HCL ER (SR) 150 MG PO TB12
150.0000 mg | ORAL_TABLET | Freq: Every day | ORAL | Status: DC
  Administered 2019-01-27 – 2019-01-30 (×4): 150 mg via ORAL
  Filled 2019-01-26 (×4): qty 1

## 2019-01-26 MED ORDER — SODIUM CHLORIDE 0.9% FLUSH
3.0000 mL | Freq: Two times a day (BID) | INTRAVENOUS | Status: DC
  Administered 2019-01-27 – 2019-01-28 (×4): 3 mL via INTRAVENOUS
  Administered 2019-01-29: 6 mL via INTRAVENOUS
  Administered 2019-01-29: 3 mL via INTRAVENOUS
  Administered 2019-01-30: 6 mL via INTRAVENOUS

## 2019-01-26 MED ORDER — BIVALIRUDIN BOLUS VIA INFUSION - CUPID
INTRAVENOUS | Status: DC | PRN
  Administered 2019-01-26: 81.675 mg via INTRAVENOUS

## 2019-01-26 MED ORDER — VERAPAMIL HCL 2.5 MG/ML IV SOLN
INTRAVENOUS | Status: DC | PRN
  Administered 2019-01-26: 10 mL via INTRA_ARTERIAL

## 2019-01-26 MED ORDER — CARVEDILOL 3.125 MG PO TABS
3.1250 mg | ORAL_TABLET | Freq: Two times a day (BID) | ORAL | Status: DC
  Administered 2019-01-27 – 2019-01-30 (×8): 3.125 mg via ORAL
  Filled 2019-01-26 (×8): qty 1

## 2019-01-26 MED ORDER — HEPARIN SODIUM (PORCINE) 5000 UNIT/ML IJ SOLN
4000.0000 [IU] | Freq: Once | INTRAMUSCULAR | Status: AC
  Administered 2019-01-26: 4000 [IU] via INTRAVENOUS

## 2019-01-26 MED ORDER — HEPARIN SODIUM (PORCINE) 1000 UNIT/ML IJ SOLN
INTRAMUSCULAR | Status: DC | PRN
  Administered 2019-01-26: 5000 [IU] via INTRAVENOUS

## 2019-01-26 MED ORDER — ACETAMINOPHEN 325 MG PO TABS
650.0000 mg | ORAL_TABLET | ORAL | Status: DC | PRN
  Filled 2019-01-26: qty 2

## 2019-01-26 MED ORDER — BUPROPION HCL ER (SR) 150 MG PO TB12
300.0000 mg | ORAL_TABLET | Freq: Every day | ORAL | Status: DC
  Administered 2019-01-28 – 2019-01-30 (×3): 300 mg via ORAL
  Filled 2019-01-26 (×5): qty 2

## 2019-01-26 MED ORDER — ACETAMINOPHEN 325 MG PO TABS: 650.0000 mg | ORAL_TABLET | ORAL | Status: DC | PRN

## 2019-01-26 MED ORDER — FENTANYL CITRATE (PF) 100 MCG/2ML IJ SOLN
INTRAMUSCULAR | Status: AC
  Filled 2019-01-26: qty 2

## 2019-01-26 MED ORDER — ESCITALOPRAM OXALATE 10 MG PO TABS
20.0000 mg | ORAL_TABLET | Freq: Every day | ORAL | Status: DC
  Administered 2019-01-27 – 2019-01-30 (×4): 20 mg via ORAL
  Filled 2019-01-26 (×5): qty 2

## 2019-01-26 MED ORDER — IOHEXOL 350 MG/ML SOLN
INTRAVENOUS | Status: DC | PRN
  Administered 2019-01-26: 195 mL via INTRA_ARTERIAL

## 2019-01-26 MED ORDER — HYDRALAZINE HCL 20 MG/ML IJ SOLN: 5.0000 mg | INTRAMUSCULAR | Status: AC | PRN

## 2019-01-26 MED ORDER — MORPHINE SULFATE (PF) 4 MG/ML IV SOLN
INTRAVENOUS | Status: AC
  Administered 2019-01-26: 4 mg via INTRAVENOUS
  Filled 2019-01-26: qty 1

## 2019-01-26 MED ORDER — SODIUM CHLORIDE 0.9 % IV SOLN: 1.7500 mg/kg/h | INTRAVENOUS | Status: DC

## 2019-01-26 MED ORDER — HEPARIN (PORCINE) IN NACL 1000-0.9 UT/500ML-% IV SOLN
INTRAVENOUS | Status: AC
  Filled 2019-01-26: qty 1500

## 2019-01-26 MED ORDER — NITROGLYCERIN 1 MG/10 ML FOR IR/CATH LAB
INTRA_ARTERIAL | Status: DC | PRN
  Administered 2019-01-26 (×4): 200 ug via INTRACORONARY

## 2019-01-26 MED ORDER — MORPHINE SULFATE (PF) 4 MG/ML IV SOLN
4.0000 mg | Freq: Once | INTRAVENOUS | Status: AC
  Administered 2019-01-26: 4 mg via INTRAVENOUS

## 2019-01-26 MED ORDER — MIDAZOLAM HCL 2 MG/2ML IJ SOLN
INTRAMUSCULAR | Status: DC | PRN
  Administered 2019-01-26: 2 mg via INTRAVENOUS

## 2019-01-26 MED ORDER — TICAGRELOR 90 MG PO TABS
90.0000 mg | ORAL_TABLET | Freq: Two times a day (BID) | ORAL | Status: DC
  Administered 2019-01-27 (×2): 90 mg via ORAL
  Filled 2019-01-26 (×4): qty 1

## 2019-01-26 MED ORDER — DIAZEPAM 5 MG PO TABS
5.0000 mg | ORAL_TABLET | Freq: Four times a day (QID) | ORAL | Status: DC | PRN
  Administered 2019-01-27 (×2): 5 mg via ORAL
  Filled 2019-01-26 (×2): qty 1

## 2019-01-26 MED ORDER — NITROGLYCERIN 0.4 MG SL SUBL: 0.4000 mg | SUBLINGUAL_TABLET | SUBLINGUAL | Status: DC | PRN

## 2019-01-26 MED ORDER — AMLODIPINE BESYLATE 5 MG PO TABS: 5.0000 mg | ORAL_TABLET | Freq: Every day | ORAL | Status: DC

## 2019-01-26 MED ORDER — ATORVASTATIN CALCIUM 80 MG PO TABS: 80.0000 mg | ORAL_TABLET | Freq: Every day | ORAL | Status: DC

## 2019-01-26 MED ORDER — BUPROPION HCL ER (SR) 150 MG PO TB12: 150.0000 mg | ORAL_TABLET | ORAL | Status: DC

## 2019-01-26 MED ORDER — HEPARIN (PORCINE) 25000 UT/250ML-% IV SOLN
INTRAVENOUS | Status: AC
  Administered 2019-01-26: 4000 [IU]/kg/h
  Filled 2019-01-26: qty 250

## 2019-01-26 MED ORDER — LOSARTAN POTASSIUM 50 MG PO TABS
100.0000 mg | ORAL_TABLET | Freq: Every day | ORAL | Status: DC
  Administered 2019-01-27: 100 mg via ORAL
  Filled 2019-01-26 (×3): qty 2

## 2019-01-26 MED ORDER — HEPARIN (PORCINE) IN NACL 1000-0.9 UT/500ML-% IV SOLN
INTRAVENOUS | Status: DC | PRN
  Administered 2019-01-26 (×3): 500 mL

## 2019-01-26 MED ORDER — MIDAZOLAM HCL 2 MG/2ML IJ SOLN
INTRAMUSCULAR | Status: AC
  Filled 2019-01-26: qty 2

## 2019-01-26 MED ORDER — SODIUM CHLORIDE 0.9 % IV SOLN
INTRAVENOUS | Status: DC
  Administered 2019-01-26 – 2019-01-29 (×2): via INTRAVENOUS

## 2019-01-26 MED ORDER — SODIUM CHLORIDE 0.9 % IV SOLN
INTRAVENOUS | Status: AC | PRN
  Administered 2019-01-26: 109 mL/h via INTRAVENOUS

## 2019-01-26 MED ORDER — SODIUM CHLORIDE 0.9 % IV SOLN
INTRAVENOUS | Status: DC
  Administered 2019-01-27: 09:00:00 via INTRAVENOUS

## 2019-01-26 MED ORDER — ASPIRIN 81 MG PO CHEW
81.0000 mg | CHEWABLE_TABLET | Freq: Every day | ORAL | Status: DC
  Administered 2019-01-27 – 2019-01-30 (×4): 81 mg via ORAL
  Filled 2019-01-26 (×5): qty 1

## 2019-01-26 MED ORDER — BIVALIRUDIN TRIFLUOROACETATE 250 MG IV SOLR
INTRAVENOUS | Status: AC
  Filled 2019-01-26: qty 250

## 2019-01-26 MED ORDER — TICAGRELOR 90 MG PO TABS
ORAL_TABLET | ORAL | Status: AC
  Filled 2019-01-26: qty 2

## 2019-01-26 MED ORDER — SODIUM CHLORIDE 0.9 % IV SOLN
INTRAVENOUS | Status: AC | PRN
  Administered 2019-01-26: 10 mL/h via INTRAVENOUS

## 2019-01-26 MED ORDER — LIDOCAINE HCL (PF) 1 % IJ SOLN
INTRAMUSCULAR | Status: AC
  Filled 2019-01-26: qty 30

## 2019-01-26 MED ORDER — SODIUM CHLORIDE 0.9 % IV SOLN
INTRAVENOUS | Status: AC | PRN
  Administered 2019-01-26 (×2): 1.75 mg/kg/h via INTRAVENOUS

## 2019-01-26 MED ORDER — PANTOPRAZOLE SODIUM 40 MG PO TBEC
40.0000 mg | DELAYED_RELEASE_TABLET | Freq: Every day | ORAL | Status: DC
  Administered 2019-01-27 – 2019-01-30 (×4): 40 mg via ORAL
  Filled 2019-01-26 (×4): qty 1

## 2019-01-26 MED ORDER — ASPIRIN EC 81 MG PO TBEC: 81.0000 mg | DELAYED_RELEASE_TABLET | Freq: Every day | ORAL | Status: DC

## 2019-01-26 MED ORDER — LABETALOL HCL 5 MG/ML IV SOLN: 10.0000 mg | INTRAVENOUS | Status: AC | PRN

## 2019-01-26 MED ORDER — LEVOTHYROXINE SODIUM 25 MCG PO TABS
125.0000 ug | ORAL_TABLET | Freq: Every day | ORAL | Status: DC
  Administered 2019-01-27 – 2019-01-30 (×4): 125 ug via ORAL
  Filled 2019-01-26 (×4): qty 1

## 2019-01-26 SURGICAL SUPPLY — 20 items
BALLN SAPPHIRE 2.0X12 (BALLOONS) ×2
BALLN SAPPHIRE 2.5X15 (BALLOONS) ×2
BALLN SAPPHIRE ~~LOC~~ 3.25X15 (BALLOONS) ×2 IMPLANT
BALLOON SAPPHIRE 2.0X12 (BALLOONS) ×1 IMPLANT
BALLOON SAPPHIRE 2.5X15 (BALLOONS) ×1 IMPLANT
CATH INFINITI JR4 5F (CATHETERS) ×2 IMPLANT
CATH OPTITORQUE TIG 4.0 5F (CATHETERS) ×2 IMPLANT
CATH VISTA GUIDE 6FR XBLAD3.5 (CATHETERS) ×2 IMPLANT
DEVICE RAD COMP TR BAND LRG (VASCULAR PRODUCTS) ×2 IMPLANT
GLIDESHEATH SLEND SS 6F .021 (SHEATH) ×2 IMPLANT
GUIDEWIRE INQWIRE 1.5J.035X260 (WIRE) ×1 IMPLANT
INQWIRE 1.5J .035X260CM (WIRE) ×2
KIT ENCORE 26 ADVANTAGE (KITS) ×2 IMPLANT
KIT HEART LEFT (KITS) ×2 IMPLANT
PACK CARDIAC CATHETERIZATION (CUSTOM PROCEDURE TRAY) ×2 IMPLANT
SHEATH PROBE COVER 6X72 (BAG) ×2 IMPLANT
STENT RESOLUTE ONYX 3.0X22 (Permanent Stent) ×2 IMPLANT
TRANSDUCER W/STOPCOCK (MISCELLANEOUS) ×2 IMPLANT
TUBING CIL FLEX 10 FLL-RA (TUBING) ×2 IMPLANT
WIRE PT2 MS 185 (WIRE) ×2 IMPLANT

## 2019-01-26 NOTE — ED Provider Notes (Signed)
[facility] EMERGENCY DEPARTMENT Provider Note   CSN: [id number] Arrival date & time: 01/26/19  2029    History   Chief Complaint Chief Complaint  Patient presents with  . Back Pain    HPI Tony Alvarez is a 64 y.o. male.     HPI Patient presents to the emergency room for evaluation of acute onset of chest pain that started about 3 hours ago.  Is an aching pressure pain.  Patient states he had pain in his back that radiated to the front of his chest.  He has felt somewhat short of breath and diaphoretic.  He denies any nausea vomiting.  Denies any history of heart disease.  He does have history of hypercholesterolemia and hypertension.  He does smoke Past Medical History:  Diagnosis Date  . Barrett's esophagus 04/21/2018  . Depression   . High cholesterol   . Hypertension   . Hypothyroid   . Sleep apnea     Patient Active Problem List   Diagnosis Date Noted  . History of colonic polyps 04/21/2018  . Barrett's esophagus 04/21/2018  . Barrett's esophagus without dysplasia 04/21/2018  . Rectal bleeding 04/21/2018    Past Surgical History:  Procedure Laterality Date  . BIOPSY  05/24/2018   Procedure: BIOPSY;  Surgeon: Rogene Houston, MD;  Location: [facility];  Service: Endoscopy;;  gastric  . COLONOSCOPY    . COLONOSCOPY N/A 05/24/2018   Procedure: COLONOSCOPY;  Surgeon: Rogene Houston, MD;  Location: [facility];  Service: Endoscopy;  Laterality: N/A;  8:55  . COLONOSCOPY WITH ESOPHAGOGASTRODUODENOSCOPY (EGD)    . ESOPHAGOGASTRODUODENOSCOPY N/A 05/24/2018   Procedure: ESOPHAGOGASTRODUODENOSCOPY (EGD);  Surgeon: Rogene Houston, MD;  Location: [facility];  Service: Endoscopy;  Laterality: N/A;  . MEDIAL PARTIAL KNEE REPLACEMENT     2015  . POLYPECTOMY  05/24/2018   Procedure: POLYPECTOMY;  Surgeon: Rogene Houston, MD;  Location: [facility];  Service: Endoscopy;;  colon  . rt hand injury    . shoulder surgery for bursitis    . TONSILLECTOMY           Home Medications    Prior to Admission medications   Medication Sig Start Date End Date Taking? Authorizing Provider  amLODipine (NORVASC) 5 MG tablet Take 5 mg by mouth daily.    [provider]  buPROPion (WELLBUTRIN SR) 150 MG 12 hr tablet Take 150-300 mg by mouth See admin instructions. Take 300mg  by mouth in the morning and 150mg  at lunch 03/27/18   [provider]  escitalopram (LEXAPRO) 20 MG tablet Take 20 mg by mouth daily.    [provider]  levothyroxine (SYNTHROID, LEVOTHROID) 125 MCG tablet Take 125 mcg by mouth daily before breakfast.    [provider]  losartan (COZAAR) 100 MG tablet Take 100 mg by mouth daily.    [provider]  pantoprazole (PROTONIX) 40 MG tablet Take 1 tablet (40 mg total) by mouth daily before breakfast. 05/24/18   Rehman, Mechele Dawley, MD  pravastatin (PRAVACHOL) 20 MG tablet Take 20 mg by mouth daily.    [provider]    Family History Family History  Problem Relation Age of Onset  . Colon cancer Neg Hx     Social History Social History   Tobacco Use  . Smoking status: Current Every Day Smoker    Packs/day: 0.50    Years: 40.00    Pack years: 20.00    Types: Cigarettes  . Smokeless  tobacco: Never Used  . Tobacco comment: 8-9 cigarettes a day since age 32  Substance Use Topics  . Alcohol use: Never    Frequency: Never  . Drug use: Never     Allergies   Patient has no known allergies.   Review of Systems Review of Systems  All other systems reviewed and are negative.    Physical Exam Updated Vital Signs BP (!) 149/91   Pulse 88   Temp 98 F (36.7 C) (Oral)   Resp 20   Ht 1.778 m (5\' 10" )   Wt 108.9 kg Comment: Simultaneous filing. User may not have seen previous data.  SpO2 95%   BMI 34.44 kg/m   Physical Exam Vitals signs and nursing note reviewed.  Constitutional:      General: He is not in acute distress.    Appearance: He is well-developed. He is  diaphoretic.     Comments: Overweight  HENT:     Head: Normocephalic and atraumatic.     Right Ear: External ear normal.     Left Ear: External ear normal.  Eyes:     General: No scleral icterus.       Right eye: No discharge.        Left eye: No discharge.     Conjunctiva/sclera: Conjunctivae normal.  Neck:     Musculoskeletal: Neck supple.     Trachea: No tracheal deviation.  Cardiovascular:     Rate and Rhythm: Normal rate and regular rhythm.  Pulmonary:     Effort: Pulmonary effort is normal. No respiratory distress.     Breath sounds: Normal breath sounds. No stridor. No wheezing or rales.  Abdominal:     General: Bowel sounds are normal. There is no distension.     Palpations: Abdomen is soft.     Tenderness: There is no abdominal tenderness. There is no guarding or rebound.  Musculoskeletal:        General: No tenderness.  Skin:    General: Skin is warm.     Findings: No rash.  Neurological:     Mental Status: He is alert.     Cranial Nerves: No cranial nerve deficit (no facial droop, extraocular movements intact, no slurred speech).     Sensory: No sensory deficit.     Motor: No abnormal muscle tone or seizure activity.     Coordination: Coordination normal.      ED Treatments / Results  Labs (all labs ordered are listed, but only abnormal results are displayed) Labs Reviewed  CBC WITH DIFFERENTIAL/PLATELET - Abnormal; Notable for the following components:      Result Value   WBC 15.3 (*)    Neutro Abs 12.6 (*)    Abs Immature Granulocytes 0.09 (*)    All other components within normal limits  I-STAT TROPONIN, ED - Abnormal; Notable for the following components:   Troponin i, poc 3.16 (*)    All other components within normal limits  PROTIME-INR  APTT  COMPREHENSIVE METABOLIC PANEL  TROPONIN I  LIPID PANEL    EKG EKG Normal sinus rhythm Rate 84 ST elevation anterior lateral leads Acute ST elevation myocardial infarction No prior EKG for  comparison Radiology No results found.  Procedures .Critical Care Performed by: Dorie Rank, MD Authorized by: Dorie Rank, MD   Critical care provider statement:    Critical care time (minutes):  30   Critical care was time spent personally by me on the following activities:  Discussions with consultants, evaluation of  patient's response to treatment, examination of patient, ordering and performing treatments and interventions, ordering and review of laboratory studies, ordering and review of radiographic studies, pulse oximetry, re-evaluation of patient's condition, obtaining history from patient or surrogate and review of old charts   (including critical care time)  Medications Ordered in ED Medications  0.9 %  sodium chloride infusion ( Intravenous New Bag/Given 01/26/19 2107)  heparin injection 60 Units/kg (has no administration in time range)  aspirin chewable tablet 324 mg (324 mg Oral Given 01/26/19 2104)  heparin 25000-0.45 UT/250ML-% infusion (6,534 Units/kg/hr  Bolus from Bag 01/26/19 2111)  morphine 4 MG/ML injection 4 mg (4 mg Intravenous Given 01/26/19 2108)     Initial Impression / Assessment and Plan / ED Course  I have reviewed the triage vital signs and the nursing notes.  Pertinent labs & imaging results that were available during my care of the patient were reviewed by me and considered in my medical decision making (see chart for details).  Clinical Course as of Jan 25 2113  Wed Jan 26, 2019  2112 Code STEMI activated.  Case was discussed with [name].  Emergent transport to [facility]   [JK]    Clinical Course User Index [JK] Dorie Rank, MD   Patient presented to the emergency room with acute onset of chest pain.  His EKG on arrival is concerning for an acute anterior lateral myocardial infarction.  Treatment per STEMI protocol.  Aspirin and heparin bolus have been given.  Patient will be transferred to [facility] for cardiac catheterization  and further treatment.  Pt has remained hemodynamically stable.  Will continue to monitor closely.  Final Clinical Impressions(s) / ED Diagnoses   Final diagnoses:  ST elevation myocardial infarction (STEMI), unspecified artery [facility])     Dorie Rank, MD 01/26/19 2115

## 2019-01-26 NOTE — ED Notes (Signed)
Pt has had 1200 mg Ibu 600mg  about 3pm and 600mg  about 1930. MD Tomi Bamberger MD made aware

## 2019-01-26 NOTE — ED Notes (Signed)
Pt being transported by EMS to [facility].

## 2019-01-26 NOTE — ED Notes (Signed)
Date and time results received: 01/26/19 1545  Test: troponin  Critical Value: 5.54  Name of Provider Notified: Tomi Bamberger  Orders Received? Or Actions Taken?: Pt in route emergency traffic to another facility for cath

## 2019-01-26 NOTE — H&P (Addendum)
Cardiology History & Physical    Patient ID: Tony Alvarez MRN: [id number], DOB: [birth year] Date of Encounter: 01/26/2019, 9:54 PM Primary Physician: [facility]  Chief Complaint: Chest pain   HPI: Tony Alvarez is a 63 y.o. male with history of HTN, HLD, who presents with acute CP.  Pt was in [place] until this afternoon, when he felt the acute onset of chest pain radiating to the back while raking leaves.  This pain persisted and he presented to the [facility] ED for evaluation.  There, he was hemodynamically stable;  12 lead ECG showed anterior/anterolateral Q waves with STE and inferior Q waves.  He was given full dose ASA, IV morphine, 4000 units IV heparin and transferred directly to the [facility] cath lab as a code STEMI activation.  Past Medical History:  Diagnosis Date  . Barrett's esophagus 04/21/2018  . Depression   . High cholesterol   . Hypertension   . Hypothyroid   . Sleep apnea      Surgical History:  Past Surgical History:  Procedure Laterality Date  . BIOPSY  05/24/2018   Procedure: BIOPSY;  Surgeon: Rogene Houston, MD;  Location: [facility] ENDO SUITE;  Service: Endoscopy;;  gastric  . COLONOSCOPY    . COLONOSCOPY N/A 05/24/2018   Procedure: COLONOSCOPY;  Surgeon: Rogene Houston, MD;  Location: [facility] END[facility];  Service: Endoscopy;  Laterality: N/A;  8:55  . COLONOSCOPY WITH ESOPHAGOGASTRODUODENOSCOPY (EGD)    . ESOPHAGOGASTRODUODENOSCOPY N/A 05/24/2018   Procedure: ESOPHAGOGASTRODUODENOSCOPY (EGD);  Surgeon: Rogene Houston, MD;  Location: [facility];  Service: Endoscopy;  Laterality: N/A;  . MEDIAL PARTIAL KNEE REPLACEMENT     2015  . POLYPECTOMY  05/24/2018   Procedure: POLYPECTOMY;  Surgeon: Rogene Houston, MD;  Location: [facility];  Service: Endoscopy;;  colon  . rt hand injury    . shoulder surgery for bursitis    . TONSILLECTOMY       Home Meds: Prior to Admission medications   Medication Sig Start Date End Date Taking? Authorizing Provider   amLODipine (NORVASC) 5 MG tablet Take 5 mg by mouth daily.    [provider]  buPROPion (WELLBUTRIN SR) 150 MG 12 hr tablet Take 150-300 mg by mouth See admin instructions. Take 300mg  by mouth in the morning and 150mg  at lunch 03/27/18   [provider]  escitalopram (LEXAPRO) 20 MG tablet Take 20 mg by mouth daily.    [provider]  levothyroxine (SYNTHROID, LEVOTHROID) 125 MCG tablet Take 125 mcg by mouth daily before breakfast.    [provider]  losartan (COZAAR) 100 MG tablet Take 100 mg by mouth daily.    [provider]  pantoprazole (PROTONIX) 40 MG tablet Take 1 tablet (40 mg total) by mouth daily before breakfast. 05/24/18   Rehman, Mechele Dawley, MD  pravastatin (PRAVACHOL) 20 MG tablet Take 20 mg by mouth daily.    [provider]    Allergies: No Known Allergies  Social History   Socioeconomic History  . Marital status: Married    Spouse name: Not on file  . Number of children: Not on file  . Years of education: Not on file  . Highest education level: Not on file  Occupational History  . Not on file  Social Needs  . Financial resource strain: Not on file  . Food insecurity:    Worry: Not on file    Inability: Not on file  . Transportation needs:  Medical: Not on file    Non-medical: Not on file  Tobacco Use  . Smoking status: Current Every Day Smoker    Packs/day: 0.50    Years: 40.00    Pack years: 20.00    Types: Cigarettes  . Smokeless tobacco: Never Used  . Tobacco comment: 8-9 cigarettes a day since age 55  Substance and Sexual Activity  . Alcohol use: Never    Frequency: Never  . Drug use: Never  . Sexual activity: Not on file  Lifestyle  . Physical activity:    Days per week: Not on file    Minutes per session: Not on file  . Stress: Not on file  Relationships  . Social connections:    Talks on phone: Not on file    Gets together: Not on file    Attends religious service: Not on file     Active member of club or organization: Not on file    Attends meetings of clubs or organizations: Not on file    Relationship status: Not on file  . Intimate partner violence:    Fear of current or ex partner: Not on file    Emotionally abused: Not on file    Physically abused: Not on file    Forced sexual activity: Not on file  Other Topics Concern  . Not on file  Social History Narrative  . Not on file     Family History  Problem Relation Age of Onset  . Colon cancer Neg Hx     Review of Systems: All other systems reviewed and are otherwise negative except as noted above.  Labs:   Lab Results  Component Value Date   WBC 15.3 (H) 01/26/2019   HGB 17.0 01/26/2019   HCT 50.8 01/26/2019   MCV 92.5 01/26/2019   PLT 231 01/26/2019    Recent Labs  Lab 01/26/19 2056  NA 140  K 3.6  CL 105  CO2 23  BUN 14  CREATININE 1.27*  CALCIUM 10.0  PROT 8.0  BILITOT 1.4*  ALKPHOS 67  ALT 45*  AST 66*  GLUCOSE 128*   Recent Labs    01/26/19 2056  TROPONINI 5.54*   Lab Results  Component Value Date   CHOL 209 (H) 01/26/2019   HDL 37 (L) 01/26/2019   LDLCALC 134 (H) 01/26/2019   TRIG 190 (H) 01/26/2019   No results found for: DDIMER  Radiology/Studies:  Dg Chest Port 1 View  Result Date: 01/26/2019 CLINICAL DATA:  Patient states thoracic back pain that radiates into mid chest since 1500HRS today. Hx of HTN and current smoker. EXAM: PORTABLE CHEST 1 VIEW COMPARISON:  None. FINDINGS: Cardiac silhouette is normal in size. No mediastinal masses. There is irregular opacity extending inferiorly from the right hilum toward the central right lower lobe. Left hilar contours normal. Lungs are hyperexpanded. Remainder of the lungs is clear. No pleural effusion or pneumothorax. Skeletal structures are grossly intact. IMPRESSION: 1. Opacity extending inferiorly from the right hilum. This could reflect a mass or be due to infection or inflammation. Recommend follow-up chest CT with  contrast for further assessment. Electronically Signed   By: Lajean Manes M.D.   On: 01/26/2019 21:23   Wt Readings from Last 3 Encounters:  01/26/19 108.9 kg  05/24/18 113.4 kg  04/21/18 115.1 kg    EKG: Anterior/anterolateral STEMI  Physical Exam: Blood pressure 137/86, pulse 81, temperature 98 F (36.7 C), temperature source Oral, resp. rate 17, height 5\' 10"  (1.778 m),  weight 108.9 kg, SpO2 93 %. Body mass index is 34.44 kg/m. General: Well developed, well nourished, in no acute distress, endorses mild ongoing CP Head: Normocephalic, atraumatic, sclera non-icteric, no xanthomas, nares are without discharge.  Neck: Negative for carotid bruits. JVD not elevated. Lungs: Clear bilaterally to auscultation without wheezes, rales, or rhonchi. Breathing is unlabored. Heart: RRR with S1 S2. No murmurs, rubs, or gallops appreciated. Abdomen: Soft, non-tender, non-distended with normoactive bowel sounds. No hepatomegaly. No rebound/guarding. No obvious abdominal masses. Msk:  Strength and tone appear normal for age. Extremities: No clubbing or cyanosis. No edema.  Distal pedal pulses are 2+ and equal bilaterally. Neuro: Alert and oriented X 3. No focal deficit. No facial asymmetry. Moves all extremities spontaneously. Psych:  Responds to questions appropriately with a normal affect.    Assessment and Plan  57M who presents with acute anterior/anterolateral STEMI.  Plan for emergent coronary angiography and possible PCI.  Signed, Doylene Canning, MD 01/26/2019, 9:54 PM    Patient seen and examined. Agree with assessment and plan.  Mr. Vera Wishart is a 63 year old male who has a history of hypertension, hyperlipidemia, and depression.  His wife had recently died 39 month ago after complications from brain aneurysm.  Today he began to notice significant substernal chest pressure while waking leaves at approximately 3 PM.  His chest pain persisted ultimately leading to presentation to any  pain emergency room.  ECG showed Q waves inferiorly with ST elevation V2 through V6 and in lead I.  A code STEMI was activated.  He received heparin, and morphine with some improvement in his chest pain.  He presented directly to the cardiac catheterization laboratory by EMS.  Upon arrival he is hypertensive.  He still has residual 3-4 over 10 chest tightness.  His ECG shows up to 6 mm ST elevation in the anterolateral leads with 1 mm in aVL and Q waves inferiorly with Q waves across the entire precordium.  I discussed the need for emergent catheterization.  We will proceed with emergent catheterization.  Suspect large LAD occlusion.   Troy Sine, MD, [facility] 01/26/2019 11:20 PM

## 2019-01-26 NOTE — ED Notes (Signed)
Date and time results received: 01/26/19 2109  Test: I-Stat Troponin Critical Value: 3.16  Name of Provider Notified: Hillard Danker MD

## 2019-01-26 NOTE — ED Notes (Signed)
Pt verbally reports his wife passed away last month. [facility] currently arrived to transfer Pt to [facility]. Pt aware on transport.

## 2019-01-26 NOTE — ED Notes (Signed)
Call to [facility] to verify Heparin order, advised STEMI pt going to [facility] for cath lab

## 2019-01-26 NOTE — ED Triage Notes (Signed)
Pt reports back pain that started around 3 pm this afternoon after raking leaves. Pain is localized to thoracic area described as sharp, constant that radiates through to chest. No other symptoms reported.

## 2019-01-27 ENCOUNTER — Encounter (HOSPITAL_COMMUNITY): Payer: Self-pay

## 2019-01-27 ENCOUNTER — Inpatient Hospital Stay (HOSPITAL_COMMUNITY): Payer: BC Managed Care – PPO

## 2019-01-27 DIAGNOSIS — I213 ST elevation (STEMI) myocardial infarction of unspecified site: Secondary | ICD-10-CM

## 2019-01-27 LAB — ECHOCARDIOGRAM LIMITED
Height: 70 in
Weight: 3840 oz

## 2019-01-27 LAB — HIV ANTIBODY (ROUTINE TESTING W REFLEX): HIV Screen 4th Generation wRfx: NONREACTIVE

## 2019-01-27 LAB — CBC
HCT: 45.8 % (ref 39.0–52.0)
Hemoglobin: 15.7 g/dL (ref 13.0–17.0)
MCH: 31 pg (ref 26.0–34.0)
MCHC: 34.3 g/dL (ref 30.0–36.0)
MCV: 90.5 fL (ref 80.0–100.0)
Platelets: 193 10*3/uL (ref 150–400)
RBC: 5.06 MIL/uL (ref 4.22–5.81)
RDW: 13.6 % (ref 11.5–15.5)
WBC: 14 10*3/uL — ABNORMAL HIGH (ref 4.0–10.5)
nRBC: 0.1 % (ref 0.0–0.2)

## 2019-01-27 LAB — GLUCOSE, CAPILLARY: GLUCOSE-CAPILLARY: 142 mg/dL — AB (ref 70–99)

## 2019-01-27 LAB — HEPATIC FUNCTION PANEL
ALT: 77 U/L — ABNORMAL HIGH (ref 0–44)
AST: 342 U/L — ABNORMAL HIGH (ref 15–41)
Albumin: 3.9 g/dL (ref 3.5–5.0)
Alkaline Phosphatase: 66 U/L (ref 38–126)
Bilirubin, Direct: 0.2 mg/dL (ref 0.0–0.2)
Indirect Bilirubin: 1.1 mg/dL — ABNORMAL HIGH (ref 0.3–0.9)
Total Bilirubin: 1.3 mg/dL — ABNORMAL HIGH (ref 0.3–1.2)
Total Protein: 6.2 g/dL — ABNORMAL LOW (ref 6.5–8.1)

## 2019-01-27 LAB — TROPONIN I: Troponin I: >65 ng/mL (ref <0.03)

## 2019-01-27 LAB — BASIC METABOLIC PANEL
Anion gap: 8 (ref 5–15)
BUN: 12 mg/dL (ref 6–20)
CO2: 21 mmol/L — ABNORMAL LOW (ref 22–32)
Calcium: 9.3 mg/dL (ref 8.9–10.3)
Chloride: 110 mmol/L (ref 98–111)
Creatinine, Ser: 1.14 mg/dL (ref 0.61–1.24)
GFR calc Af Amer: >60 mL/min (ref >60)
GFR calc non Af Amer: >60 mL/min (ref >60)
Glucose, Bld: 128 mg/dL — ABNORMAL HIGH (ref 70–99)
Potassium: 3.9 mmol/L (ref 3.5–5.1)
Sodium: 139 mmol/L (ref 135–145)

## 2019-01-27 LAB — MRSA PCR SCREENING: MRSA by PCR: NEGATIVE

## 2019-01-27 LAB — POCT ACTIVATED CLOTTING TIME: Activated Clotting Time: 637 seconds

## 2019-01-27 MED ORDER — ADULT MULTIVITAMIN W/MINERALS CH
1.0000 | ORAL_TABLET | Freq: Every day | ORAL | Status: DC
  Administered 2019-01-27 – 2019-01-30 (×4): 1 via ORAL
  Filled 2019-01-27 (×4): qty 1

## 2019-01-27 MED ORDER — ATORVASTATIN CALCIUM 80 MG PO TABS
80.0000 mg | ORAL_TABLET | Freq: Every day | ORAL | Status: DC
  Administered 2019-01-28 – 2019-01-30 (×3): 80 mg via ORAL
  Filled 2019-01-27 (×3): qty 1

## 2019-01-27 MED ORDER — LORAZEPAM 1 MG PO TABS: 1.0000 mg | ORAL_TABLET | Freq: Four times a day (QID) | ORAL | Status: DC | PRN

## 2019-01-27 MED ORDER — LORAZEPAM 2 MG/ML IJ SOLN
2.0000 mg | INTRAMUSCULAR | Status: DC | PRN
  Administered 2019-01-27: 3 mg via INTRAVENOUS
  Administered 2019-01-28 – 2019-01-29 (×2): 2 mg via INTRAVENOUS
  Filled 2019-01-27 (×3): qty 1
  Filled 2019-01-27: qty 2

## 2019-01-27 MED ORDER — VITAMIN B-1 100 MG PO TABS
100.0000 mg | ORAL_TABLET | Freq: Every day | ORAL | Status: DC
  Administered 2019-01-27 – 2019-01-29 (×3): 100 mg via ORAL
  Filled 2019-01-27 (×3): qty 1

## 2019-01-27 MED ORDER — MORPHINE SULFATE (PF) 4 MG/ML IV SOLN
4.0000 mg | Freq: Once | INTRAVENOUS | Status: AC
  Administered 2019-01-27: 4 mg via INTRAVENOUS
  Filled 2019-01-27: qty 1

## 2019-01-27 MED ORDER — THIAMINE HCL 100 MG/ML IJ SOLN: 100.0000 mg | Freq: Every day | INTRAMUSCULAR | Status: DC

## 2019-01-27 MED ORDER — OXYCODONE HCL 5 MG PO TABS
5.0000 mg | ORAL_TABLET | Freq: Once | ORAL | Status: AC
  Administered 2019-01-27: 5 mg via ORAL

## 2019-01-27 MED ORDER — FOLIC ACID 1 MG PO TABS
1.0000 mg | ORAL_TABLET | Freq: Every day | ORAL | Status: DC
  Administered 2019-01-27 – 2019-01-30 (×4): 1 mg via ORAL
  Filled 2019-01-27 (×4): qty 1

## 2019-01-27 MED ORDER — LORAZEPAM 2 MG/ML IJ SOLN
1.0000 mg | Freq: Four times a day (QID) | INTRAMUSCULAR | Status: DC | PRN
  Administered 2019-01-27: 1 mg via INTRAVENOUS
  Filled 2019-01-27: qty 1

## 2019-01-27 MED ORDER — OXYCODONE HCL 5 MG PO TABS
ORAL_TABLET | ORAL | Status: AC
  Administered 2019-01-27: 5 mg via ORAL
  Filled 2019-01-27: qty 1

## 2019-01-27 NOTE — Progress Notes (Signed)
2D limited echocardiogram has been performed. Patient could not lie in bed due to pain in back and some chest pain,  Tony Alvarez 01/27/2019, 2:13 PM

## 2019-01-27 NOTE — Progress Notes (Signed)
RT set up CPAP and placed on patient with 4L O2 bled into circuit. Patient tolerating well at this time. No respiratory distress noted. RT will monitor as needed.

## 2019-01-27 NOTE — Progress Notes (Signed)
Spoke with Rosaria Ferries NP and clarified that she does want 3mg  of ativan to be given and to monitor respirations closely. Told to call her if no improvement by 8pm. Oncoming RN notified of these orders

## 2019-01-27 NOTE — Progress Notes (Signed)
Page to Rosaria Ferries to notify pt continues to have escalating anxiety. Pt has been given a bath linens changed and music of his choice playing in room. Nothing has improved the situation. Pt is now in low bed and fall mats are on each side of the bed. Pt is in direct line of  Vision of nurse. Sitter has been requested

## 2019-01-27 NOTE — Progress Notes (Signed)
Message sent to [facility] NP regarding pt's condition. Discussed at great length pt's continued progressive anxiety and restlessness, meds given, history and tests performed. New order received for ekg. Ekg done.

## 2019-01-27 NOTE — Progress Notes (Signed)
Responded to spiritual Care consult to assist with Advance Directives.  Unit staff gave paperwork to patient. Currently  Spiritual Care Dept are not doing AD except for urgent emergency matters.  Jaclynn Major, [place], [facility], Pager [phone]

## 2019-01-27 NOTE — Progress Notes (Signed)
Pt. Already wearing cpap. Tolerating well at this time. 

## 2019-01-27 NOTE — Progress Notes (Signed)
Progress Note  Patient Name: Tony Alvarez Date of Encounter: 01/27/2019  Primary Cardiologist: No primary care provider on file.   Subjective   Patient reported to have some back pain overnight.  He is more comfortable this morning.  Describes a dull ache in the chest that is mild.  No shortness of breath at present.  Inpatient Medications    Scheduled Meds: . amLODipine  5 mg Oral Daily  . aspirin  81 mg Oral Daily  . atorvastatin  80 mg Oral q1800  . buPROPion  150 mg Oral QAC lunch  . buPROPion  300 mg Oral Daily  . carvedilol  3.125 mg Oral BID WC  . escitalopram  20 mg Oral Daily  . levothyroxine  125 mcg Oral QAC breakfast  . losartan  100 mg Oral Daily  . pantoprazole  40 mg Oral QAC breakfast  . sodium chloride flush  3 mL Intravenous Q12H  . ticagrelor  90 mg Oral BID   Continuous Infusions: . sodium chloride Stopped (01/26/19 2117)  . sodium chloride 10 mL/hr at 01/27/19 0900  . sodium chloride    . bivalirudin (ANGIOMAX) infusion 5 mg/mL (Cath Lab,ACS,PCI indication) Stopped (01/27/19 0030)   PRN Meds: sodium chloride, acetaminophen, diazepam, nitroGLYCERIN, ondansetron (ZOFRAN) IV, sodium chloride flush   Vital Signs    Vitals:   01/27/19 0645 01/27/19 0700 01/27/19 0809 01/27/19 0837  BP: 112/61 102/76  103/87  Pulse: 82 83  79  Resp: 14 16  16   Temp:   98 F (36.7 C)   TempSrc:   Oral   SpO2: 92% 92%  96%  Weight:      Height:        Intake/Output Summary (Last 24 hours) at 01/27/2019 0941 Last data filed at 01/27/2019 0900 Gross per 24 hour  Intake 1148.08 ml  Output 250 ml  Net 898.08 ml   Last 3 Weights 01/26/2019 05/24/2018 04/21/2018  Weight (lbs) 240 lb 250 lb 253 lb 12.8 oz  Weight (kg) 108.863 kg 113.399 kg 115.123 kg      Telemetry    NSR - Personally Reviewed  ECG    Normal sinus rhythm with left bundle branch block pattern, anterolateral ST elevation consistent with acute injury, ST segment elevation improved from  presentation - Personally Reviewed  Physical Exam  Alert, oriented male, sitting in a chair at the bedside. GEN: No acute distress.   Neck: No JVD Cardiac: RRR, no murmurs, rubs, or gallops.  Respiratory: Clear to auscultation bilaterally. GI: Soft, nontender, non-distended  MS: No edema; No deformity.  Right radial site is clear. Neuro:  Nonfocal  Psych: Normal affect   Labs    Chemistry Recent Labs  Lab 01/26/19 2056 01/27/19 0049  NA 140 139  K 3.6 3.9  CL 105 110  CO2 23 21*  GLUCOSE 128* 128*  BUN 14 12  CREATININE 1.27* 1.14  CALCIUM 10.0 9.3  PROT 8.0 6.2*  ALBUMIN 4.9 3.9  AST 66* 342*  ALT 45* 77*  ALKPHOS 67 66  BILITOT 1.4* 1.3*  GFRNONAA >60 >60  GFRAA >60 >60  ANIONGAP 12 8     Hematology Recent Labs  Lab 01/26/19 2056 01/27/19 0049  WBC 15.3* 14.0*  RBC 5.49 5.06  HGB 17.0 15.7  HCT 50.8 45.8  MCV 92.5 90.5  MCH 31.0 31.0  MCHC 33.5 34.3  RDW 13.5 13.6  PLT 231 193    Cardiac Enzymes Recent Labs  Lab 01/26/19 2056 01/27/19 0049  TROPONINI 5.54* >65.00*    Recent Labs  Lab 01/26/19 2059  TROPIPOC 3.16*     BNPNo results for input(s): BNP, PROBNP in the last 168 hours.   DDimer No results for input(s): DDIMER in the last 168 hours.   Radiology    Dg Chest Port 1 View  Result Date: 01/26/2019 CLINICAL DATA:  Patient states thoracic back pain that radiates into mid chest since 1500HRS today. Hx of HTN and current smoker. EXAM: PORTABLE CHEST 1 VIEW COMPARISON:  None. FINDINGS: Cardiac silhouette is normal in size. No mediastinal masses. There is irregular opacity extending inferiorly from the right hilum toward the central right lower lobe. Left hilar contours normal. Lungs are hyperexpanded. Remainder of the lungs is clear. No pleural effusion or pneumothorax. Skeletal structures are grossly intact. IMPRESSION: 1. Opacity extending inferiorly from the right hilum. This could reflect a mass or be due to infection or inflammation.  Recommend follow-up chest CT with contrast for further assessment. Electronically Signed   By: Lajean Manes M.D.   On: 01/26/2019 21:23    Cardiac Studies   Cath: Conclusion     Prox RCA lesion is 20% stenosed.  Prox Cx lesion is 20% stenosed.  Prox LAD-1 lesion is 100% stenosed.  Post intervention, there is a 0% residual stenosis.  Prox LAD-2 lesion is 20% stenosed.  Mid LAD lesion is 40% stenosed.  LV end diastolic pressure is mildly elevated.  There is moderate left ventricular systolic dysfunction.  A stent was successfully placed.   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  Almost a common ostium left main with immediate trifurcation into the LAD, small ramus intermediate vessel and moderate size circumflex vessel.  The ramus vessel is normal.  The circumflex vessel has 20% proximal narrowing and gave rise to 2 marginal branches.  The RCA is a dominant vessel with moderate luminal irregularity with 20% narrowing proximally 40% mid stenosis and 30% narrowing proximal to the acute margin.  Probable moderate acute LV dysfunction with hypocontractility involving the anterolateral wall and apex.  LVEDP 22 mm.  Successful PCI to the totally occluded LAD with PTCA and ultimate insertion of a 3.0 x 22 mm Resolute Onyx stent postdilated to 3.26 mm with the 100% occlusion being reduced to 0% and TIMI 0 flow improving to TIMI-3 flow.  Once reperfusion was established, LAD had 20% mid LAD smooth narrowing and 40% distal smooth stenosis.  The LAD wrapped around the apex and supplied the distal third of the inferior wall.  RECOMMENDATION: DAPT for minimum of 1 year.  Optimal blood pressure control with ideal blood pressure less than 120/80.  High potency statin therapy with target LDL less than 70.  Medical therapy for concomitant CAD.  A 2D  echo Doppler study will be ordered improved LV function assessment.      Patient Profile     63 y.o. male with history of hypertension and mixed hyperlipidemia presents with acute anterolateral STEMI and undergoes primary PCI of the LAD with a drug-eluting stent.  Assessment & Plan    1.  Acute anterolateral STEMI: Patient status post primary PCI the LAD.  Appropriately treated with dual antiplatelet therapy using aspirin and ticagrelor.  Await 2D echocardiogram to assess LV function.  He is on an ARB.  Will increase his carvedilol as tolerated.  Pending evaluation of LVEF, he might benefit from Spironolactone.  2.  Mixed hyperlipidemia: Transition from pravastatin to high intensity atorvastatin 80 mg.  Disposition: Keep in CCU today.  I would be okay with him ambulating a short distance with cardiac rehab.  Await 2D echo result.  Anticipate transfer to a cardiac telemetry bed tomorrow and possible discharge Saturday if he remains stable.  For questions or updates, please contact [place] Please consult [email or web address] for contact info under        Signed, Sherren Mocha, MD  01/27/2019, 9:41 AM

## 2019-01-27 NOTE — Progress Notes (Signed)
Spoke with Aida Raider NP regarding the increasing aggitation and anxiety. Discussed at great length the patient's behavior and interventions done. She is to discuss with physician and return call. Pt at this time is requiring 1:1 to maintain a safe environment.

## 2019-01-27 NOTE — Progress Notes (Signed)
    Called by RN with patient continuing to c/o chest pain, though appears very anxious as well. Was ordered for Valium which does not seem to be helping much. In talking with patient he is restless at one moment and falling asleep shortly after. EKG stable from this am with continued ST elevation in anterior leads. Reports he does drink some ETOH at home, but has not had a drink in several days. Denies any drug use.  -- start CIWA protocol  -- UDS -- hesitant to add additional pain meds at this time as he is lethargic at times as well.   SignedReino Bellis, NP-C 01/27/2019, 3:13 PM Pager: [phone]

## 2019-01-27 NOTE — Progress Notes (Signed)
..  EKG CRITICAL VALUE     12 lead EKG performed.  Critical value noted.  Hoyt Koch, RN notified.   Ihor Gully, CCT 01/27/2019 7:25 AM

## 2019-01-27 NOTE — Care Management (Signed)
Brilinta cost sent and pending.  Midge Minium RN, BSN, NCM-BC, ACM-RN [phone]

## 2019-01-27 NOTE — Progress Notes (Addendum)
Cardiac Rehab at bedside to assess pt. Pt has had some restlessness stating he just can't get comfortable. C/O generalized discomfort in back and states he thinks its related to a strain acquired while raking leaves prior to MI. Pt does close eyes and have a rather "long blink" while conversing then startles and is quite alert. Very closely monitoring. No change in cardiac rhythm. When questioned about chest pain states "its more my back and flank area" Has had intervention for discomfort.

## 2019-01-27 NOTE — Progress Notes (Signed)
CARDIAC REHAB PHASE I   PRE:  Rate/Rhythm: 90 SR  BP:  Sitting: 121/79      SaO2: 96 RA  MODE:  Ambulation: 295 ft   POST:  Rate/Rhythm: 100  BP:  Sitting: 112/65    SaO2: 94 RA   Pt restless upon entering room. Pt agreeable to ambulate, then ambulated 27ft in hallway standby assist with steady gait. Pt took one standing rest break due to CP, pt rated 3/10. Upon return to room, pt c/o continued CP and SOB. Sats stable on RA, pt coached through purse lipped breathing. Pt placed on 2L [place] for comfort. After some rest, pt c/o CP 5/10. RN aware. Pt very restless. Pt goes from nodding off to jumping up numerous times throughout session. Pt given MI book, heart healthy diet, and smoking cessation sheet. Educated pt on ASA, Brilinta, and NTG. Will probably need education reinforced. Will continue to follow.  [phone] Rufina Falco, RN BSN 01/27/2019 2:39 PM

## 2019-01-27 NOTE — Plan of Care (Signed)
  Problem: Spiritual Needs Goal: Ability to function at adequate level Outcome: Progressing   Problem: Education: Goal: Understanding of CV disease, CV risk reduction, and recovery process will improve Outcome: Progressing   Problem: Activity: Goal: Ability to return to baseline activity level will improve Outcome: Progressing   Problem: Cardiovascular: Goal: Ability to achieve and maintain adequate cardiovascular perfusion will improve Outcome: Progressing Goal: Vascular access site(s) Level 0-1 will be maintained Outcome: Progressing   Problem: Health Behavior/Discharge Planning: Goal: Ability to safely manage health-related needs after discharge will improve Outcome: Progressing

## 2019-01-27 NOTE — TOC Benefit Eligibility Note (Signed)
Transition of Care [facility]) Benefit Eligibility Note    Patient Details  Name: Tony Alvarez MRN: [id number] Date of Birth: [birth year]   Medication/Dose: Kary Kos 90mg  BID  Covered?: Yes  Prescription Coverage Preferred Pharmacy: [place]  Spoke with Person/Company/Phone Number:: [facility] [employer]/ [phone]  Co-Pay: 90 day: $141.00/ 30 day: $47.00  Prior Approval: No     [place] Phone Number: 01/27/2019, 12:04 PM

## 2019-01-28 DIAGNOSIS — I2129 ST elevation (STEMI) myocardial infarction involving other sites: Secondary | ICD-10-CM

## 2019-01-28 LAB — HEPATIC FUNCTION PANEL
ALT: 65 U/L — AB (ref 0–44)
AST: 223 U/L — ABNORMAL HIGH (ref 15–41)
Albumin: 3.6 g/dL (ref 3.5–5.0)
Alkaline Phosphatase: 48 U/L (ref 38–126)
Bilirubin, Direct: 0.4 mg/dL — ABNORMAL HIGH (ref 0.0–0.2)
Indirect Bilirubin: 1.6 mg/dL — ABNORMAL HIGH (ref 0.3–0.9)
Total Bilirubin: 2 mg/dL — ABNORMAL HIGH (ref 0.3–1.2)
Total Protein: 6.2 g/dL — ABNORMAL LOW (ref 6.5–8.1)

## 2019-01-28 MED ORDER — SPIRONOLACTONE 25 MG PO TABS
25.0000 mg | ORAL_TABLET | Freq: Every day | ORAL | Status: DC
  Administered 2019-01-28 – 2019-01-30 (×3): 25 mg via ORAL
  Filled 2019-01-28 (×3): qty 1

## 2019-01-28 MED ORDER — RIVAROXABAN 20 MG PO TABS
20.0000 mg | ORAL_TABLET | Freq: Every day | ORAL | Status: DC
  Administered 2019-01-28 – 2019-01-30 (×3): 20 mg via ORAL
  Filled 2019-01-28 (×3): qty 1

## 2019-01-28 MED ORDER — LOSARTAN POTASSIUM 50 MG PO TABS
50.0000 mg | ORAL_TABLET | Freq: Every day | ORAL | Status: DC
  Administered 2019-01-28 – 2019-01-30 (×3): 50 mg via ORAL
  Filled 2019-01-28 (×2): qty 1

## 2019-01-28 MED ORDER — CLOPIDOGREL BISULFATE 300 MG PO TABS
600.0000 mg | ORAL_TABLET | Freq: Once | ORAL | Status: AC
  Administered 2019-01-28: 600 mg via ORAL
  Filled 2019-01-28: qty 2

## 2019-01-28 MED ORDER — CLOPIDOGREL BISULFATE 75 MG PO TABS
75.0000 mg | ORAL_TABLET | Freq: Every day | ORAL | Status: DC
  Administered 2019-01-29 – 2019-01-30 (×2): 75 mg via ORAL
  Filled 2019-01-28 (×4): qty 1

## 2019-01-28 NOTE — Discharge Instructions (Signed)
Information on my medicine - XARELTO (Rivaroxaban)  Why was Xarelto prescribed for you? Xarelto was prescribed for you to for blood clot in left ventricle of heart.  What do you need to know about xarelto ? Take your Xarelto ONCE DAILY at the same time every day with your evening meal. If you have difficulty swallowing the tablet whole, you may crush it and mix in applesauce just prior to taking your dose.  Take Xarelto exactly as prescribed by your doctor and DO NOT stop taking Xarelto without talking to the doctor who prescribed the medication.  Stopping without other stroke prevention medication to take the place of Xarelto may increase your risk of developing a clot that causes a stroke.  Refill your prescription before you run out.  After discharge, you should have regular check-up appointments with your healthcare provider that is prescribing your Xarelto.  In the future your dose may need to be changed if your kidney function or weight changes by a significant amount.  What do you do if you miss a dose? If you are taking Xarelto ONCE DAILY and you miss a dose, take it as soon as you remember on the same day then continue your regularly scheduled once daily regimen the next day. Do not take two doses of Xarelto at the same time or on the same day.   Important Safety Information A possible side effect of Xarelto is bleeding. You should call your healthcare provider right away if you experience any of the following: ? Bleeding from an injury or your nose that does not stop. ? Unusual colored urine (red or dark brown) or unusual colored stools (red or black). ? Unusual bruising for unknown reasons. ? A serious fall or if you hit your head (even if there is no bleeding).  Some medicines may interact with Xarelto and might increase your risk of bleeding while on Xarelto. To help avoid this, consult your healthcare provider or pharmacist prior to using any new prescription or  non-prescription medications, including herbals, vitamins, non-steroidal anti-inflammatory drugs (NSAIDs) and supplements.  This website has more information on Xarelto: [email or web address]  ----------------------------------------------------------------------------------------------------------------------  Information about your medication: Plavix (anti-platelet agent)  Generic Name (Brand): clopidogrel (Plavix), once daily medication  PURPOSE: You are taking this medication along with aspirin to lower your chance of having a heart attack, stroke, or blood clots in your heart stent. These can be fatal. Brilinta and aspirin help prevent platelets from sticking together and forming a clot that can block an artery or your stent.   Common SIDE EFFECTS you may experience include: bruising or bleeding more easily, shortness of breath  Do not stop taking PLAVIX without talking to the doctor who prescribes it for you. People who are treated with a stent and stop taking Plavix too soon, have a higher risk of getting a blood clot in the stent, having a heart attack, or dying. If you stop Plavix because of bleeding, or for other reasons, your risk of a heart attack or stroke may increase.   Tell all of your doctors and dentists that you are taking Plavix. They should talk to the doctor who prescribed plavix for you before you have any surgery or invasive procedure.   Contact your health care provider if you experience: severe or uncontrollable bleeding, pink/red/brown urine, vomiting blood or vomit that looks like "coffee grounds", red or black stools (looks like tar), coughing up blood or blood clots ----------------------------------------------------------------------------------------------------------------------

## 2019-01-28 NOTE — Care Management (Signed)
CM consult acknowledged. Xarelto / rivaroxaban 20mg  once daily AND Eliquis/Apixaban 5 mg twice daily twice daily benefits check sent and pending.  Midge Minium RN, BSN, NCM-BC, ACM-RN [phone]

## 2019-01-28 NOTE — Progress Notes (Signed)
1515 Came to see pt to walk. Pt sleeping soundly. Talked with RN. Pt walked with her earlier so will hold ambulation and continue to follow. Graylon Good RN BSN 01/28/2019 3:18 PM

## 2019-01-28 NOTE — TOC Benefit Eligibility Note (Signed)
Transition of Care [facility]) Benefit Eligibility Note    Patient Details  Name: Tony Alvarez MRN: [id number] Date of Birth: [birth year]   Medication/Dose: Both Eliquis and Xarelto  Covered?: Yes  Prescription Coverage [facility]  Spoke with Person/Company/Phone Number:: [facility]/ [phone]  Co-Pay: 30 day retail- $47.00 / 90 day mail order- $141.00  Prior Approval: No  [place] Phone Number: 01/28/2019, 11:31 AM

## 2019-01-28 NOTE — Plan of Care (Signed)
  Problem: Spiritual Needs Goal: Ability to function at adequate level Outcome: Progressing   Problem: Education: Goal: Understanding of CV disease, CV risk reduction, and recovery process will improve Outcome: Progressing Goal: Individualized Educational Video(s) Outcome: Progressing   Problem: Activity: Goal: Ability to return to baseline activity level will improve Outcome: Progressing   Problem: Cardiovascular: Goal: Ability to achieve and maintain adequate cardiovascular perfusion will improve Outcome: Progressing Goal: Vascular access site(s) Level 0-1 will be maintained Outcome: Progressing   Problem: Health Behavior/Discharge Planning: Goal: Ability to safely manage health-related needs after discharge will improve Outcome: Progressing   Problem: Education: Goal: Understanding of cardiac disease, CV risk reduction, and recovery process will improve Outcome: Progressing Goal: Understanding of medication regimen will improve Outcome: Progressing Goal: Individualized Educational Video(s) Outcome: Progressing   Problem: Activity: Goal: Ability to tolerate increased activity will improve Outcome: Progressing   Problem: Cardiac: Goal: Ability to achieve and maintain adequate cardiopulmonary perfusion will improve Outcome: Progressing Goal: Vascular access site(s) Level 0-1 will be maintained Outcome: Progressing   Problem: Health Behavior/Discharge Planning: Goal: Ability to safely manage health-related needs after discharge will improve Outcome: Progressing

## 2019-01-28 NOTE — Progress Notes (Signed)
Progress Note  Patient Name: Tony Alvarez Date of Encounter: 01/28/2019  Primary Cardiologist: No primary care provider on file.   Subjective   No chest pain or shortness of breath.  Patient is in and out of sleep but he responds appropriately when he is awake.  Inpatient Medications    Scheduled Meds: . aspirin  81 mg Oral Daily  . atorvastatin  80 mg Oral q1800  . buPROPion  150 mg Oral QAC lunch  . buPROPion  300 mg Oral Daily  . carvedilol  3.125 mg Oral BID WC  . escitalopram  20 mg Oral Daily  . folic acid  1 mg Oral Daily  . levothyroxine  125 mcg Oral QAC breakfast  . losartan  100 mg Oral Daily  . multivitamin with minerals  1 tablet Oral Daily  . pantoprazole  40 mg Oral QAC breakfast  . sodium chloride flush  3 mL Intravenous Q12H  . thiamine  100 mg Oral Daily  . ticagrelor  90 mg Oral BID   Continuous Infusions: . sodium chloride Stopped (01/26/19 2117)  . sodium chloride     PRN Meds: sodium chloride, acetaminophen, LORazepam, nitroGLYCERIN, ondansetron (ZOFRAN) IV, sodium chloride flush   Vital Signs    Vitals:   01/28/19 0600 01/28/19 0800 01/28/19 0801 01/28/19 0824  BP: (!) 98/55 119/75 119/75   Pulse: 85  82   Resp:      Temp:    98.5 F (36.9 C)  TempSrc:    Oral  SpO2: 92% 93%    Weight:      Height:        Intake/Output Summary (Last 24 hours) at 01/28/2019 0856 Last data filed at 01/28/2019 0800 Gross per 24 hour  Intake 191.21 ml  Output 625 ml  Net -433.79 ml   Last 3 Weights 01/26/2019 05/24/2018 04/21/2018  Weight (lbs) 240 lb 250 lb 253 lb 12.8 oz  Weight (kg) 108.863 kg 113.399 kg 115.123 kg      Telemetry    Normal sinus rhythm- Personally Reviewed   Physical Exam  Sedated male, arousable without difficulty GEN: No acute distress.   Neck: No JVD Cardiac: RRR, no murmurs, rubs, or gallops.  Respiratory: Clear to auscultation bilaterally. GI: Soft, nontender, non-distended  MS:  Trace bilateral ankle edema; No  deformity. Neuro:  Nonfocal  Psych: Normal affect   Labs    Chemistry Recent Labs  Lab 01/26/19 2056 01/27/19 0049 01/28/19 0429  NA 140 139  --   K 3.6 3.9  --   CL 105 110  --   CO2 23 21*  --   GLUCOSE 128* 128*  --   BUN 14 12  --   CREATININE 1.27* 1.14  --   CALCIUM 10.0 9.3  --   PROT 8.0 6.2* 6.2*  ALBUMIN 4.9 3.9 3.6  AST 66* 342* 223*  ALT 45* 77* 65*  ALKPHOS 67 66 48  BILITOT 1.4* 1.3* 2.0*  GFRNONAA >60 >60  --   GFRAA >60 >60  --   ANIONGAP 12 8  --      Hematology Recent Labs  Lab 01/26/19 2056 01/27/19 0049  WBC 15.3* 14.0*  RBC 5.49 5.06  HGB 17.0 15.7  HCT 50.8 45.8  MCV 92.5 90.5  MCH 31.0 31.0  MCHC 33.5 34.3  RDW 13.5 13.6  PLT 231 193    Cardiac Enzymes Recent Labs  Lab 01/26/19 2056 01/27/19 0049  TROPONINI 5.54* >65.00*    Recent  Labs  Lab 01/26/19 2059  TROPIPOC 3.16*     BNPNo results for input(s): BNP, PROBNP in the last 168 hours.   DDimer No results for input(s): DDIMER in the last 168 hours.   Radiology    Dg Chest Port 1 View  Result Date: 01/26/2019 CLINICAL DATA:  Patient states thoracic back pain that radiates into mid chest since 1500HRS today. Hx of HTN and current smoker. EXAM: PORTABLE CHEST 1 VIEW COMPARISON:  None. FINDINGS: Cardiac silhouette is normal in size. No mediastinal masses. There is irregular opacity extending inferiorly from the right hilum toward the central right lower lobe. Left hilar contours normal. Lungs are hyperexpanded. Remainder of the lungs is clear. No pleural effusion or pneumothorax. Skeletal structures are grossly intact. IMPRESSION: 1. Opacity extending inferiorly from the right hilum. This could reflect a mass or be due to infection or inflammation. Recommend follow-up chest CT with contrast for further assessment. Electronically Signed   By: Lajean Manes M.D.   On: 01/26/2019 21:23    Cardiac Studies   2D Echo: IMPRESSIONS    1. The left ventricle has moderately  reduced systolic function, with an ejection fraction of 35-40%. There is mildly increased left ventricular wall thickness.  2. Severe akinesis of the left ventricular, mid-apical anteroseptal wall, anterior wall, apical segment and inferoapical wall.  3. Small, fixed thrombus on the anteroapical wall of the left ventricle.  SUMMARY   Limited images due to patient non-cooperation during the exam. LVEF 35-40%, LAD territory infarct/ischemia, Definity contrast images demonstrate small anteroapical filling defect suggestive of possible thrombus  FINDINGS  Left Ventricle: The left ventricle has moderately reduced systolic function, with an ejection fraction of 35-40%. There is mildly increased left ventricular wall thickness. Severe akinesis of the left ventricular, mid-apical anteroseptal wall, anterior  wall, apical segment and inferoapical wall. Definity contrast agent was given IV to delineate the left ventricular endocardial borders. There is a small, fixed, anteroapical left ventricular thrombus. The thrombus appears protruding in shape and solid in  texture.  Patient Profile     63 y.o. male with history of hypertension and mixed hyperlipidemia presents with acute anterolateral STEMI and undergoes primary PCI of the LAD with a drug-eluting stent.  Assessment & Plan    1.  Acute anterolateral STEMI: Patient currently on aspirin and ticagrelor.  Unfortunately he has LV apical thrombus confirmed with Definity echo.  I think he will be best treated with a short period of "triple therapy."  Will transition ticagrelor to clopidogrel with a loading dose of clopidogrel today.  Will add a direct oral anticoagulant drug to treat his LV apical thrombus.  Will add Spironolactone today as well.  2.  LV apical thrombus: As above, will add a DOAC today.  3.  Mixed hyperlipidemia: Started on atorvastatin 80 mg.  Elevated transaminases secondary to STEMI with a high AST pattern as is typically seen.  4.   Delirium: The patient is calm this morning but he has received benzodiazepines per the CIWA protocol.  He is fairly sedated at the time of my evaluation and there is a Actuary at the bedside.  He will need to remain in the CCU today.  5.  Acute systolic heart failure: Patient with LVEF 35 to 40% secondary to anterior infarct.  Medications reviewed.  On an ARB and beta-blocker.  Add Spironolactone today.  For questions or updates, please contact [place] Please consult [email or web address] for contact info under  Signed, Sherren Mocha, MD  01/28/2019, 8:56 AM

## 2019-01-29 ENCOUNTER — Inpatient Hospital Stay (HOSPITAL_COMMUNITY): Payer: BC Managed Care – PPO

## 2019-01-29 DIAGNOSIS — G9340 Encephalopathy, unspecified: Secondary | ICD-10-CM

## 2019-01-29 DIAGNOSIS — R410 Disorientation, unspecified: Secondary | ICD-10-CM

## 2019-01-29 LAB — TSH: TSH: 4.386 u[IU]/mL (ref 0.350–4.500)

## 2019-01-29 LAB — VITAMIN B12: Vitamin B-12: 348 pg/mL (ref 180–914)

## 2019-01-29 MED ORDER — THIAMINE HCL 100 MG/ML IJ SOLN
500.0000 mg | Freq: Three times a day (TID) | INTRAVENOUS | Status: DC
  Administered 2019-01-29 – 2019-01-30 (×3): 500 mg via INTRAVENOUS
  Filled 2019-01-29 (×5): qty 5

## 2019-01-29 NOTE — Progress Notes (Signed)
Again attempted to call [name] ( brother of patient) as he has not yet returned my phone calls from this morning.  Again no answer.  Will try again at a later date.  Laurey Morale, MSN, NP-C [facility] [phone]

## 2019-01-29 NOTE — Progress Notes (Signed)
Placed pt on CPAP. Pt tolerating well.

## 2019-01-29 NOTE — Consult Note (Addendum)
NEURO HOSPITALIST  CONSULT   Requesting Physician: [name]    Chief Complaint: Chest pain  History obtained from:  Patient  Tony Alvarez    HPI:                                                                                                                                         Tony Alvarez is an 63 y.o. male with PMH HTN, HLD, sleep apnea ( with cpap) who presented to  APM with c/o chest pain on 3/18 and was transferred as a STEMI to [facility]. Neurology consulted for confusion and unsteady gait.  Per chart: patient came in for Chest pain  And had  A STEMI ( s/p cath) on 3/18. Since that time he has had some agitation and confusion. He was placed on CIWA protocol and continues to have some delirium. Per chart he only drinks two 12 oz beers per day. Patient denies ETOH usage with this examiner. He endorses 1/3 a pack of cigarettes per day. Denies drug use, diplopia. Patient stated that he did feel unsteady while walking and he says that he is stressed because of all the things that are happening at home. His wife died 01/02/2023 and he has lost 20 ibs since that time.  Sitter at bedside stated: that he was unsteady when transferring from bed to chair. This morning he was given ativan. Per patient he uses a walker at baseline at home. Attempted to call patient's brother twice to establish a better baseline, but brother Deniel Mcquiston did not answer.  Hospital course:  3/18 admitted; transferred for STEMI s/p PCI with drug eluting stent.  3/19 CIWA protocol started 3/20: exhibited unsteady gait, in addition to confusion.  NIHSS: 0    Past Medical History:  Diagnosis Date  . Barrett's esophagus 04/21/2018  . Depression   . High cholesterol   . Hypertension   . Hypothyroid   . Sleep apnea    wears CPAP at home    Past Surgical History:  Procedure Laterality Date  . BIOPSY  05/24/2018   Procedure: BIOPSY;  Surgeon: Rogene Houston, MD;  [facility];  Service: Endoscopy;;  gastric  . COLONOSCOPY    . COLONOSCOPY N/A 05/24/2018   Procedure: COLONOSCOPY;  Surgeon: Rogene Houston, MD;  [facility];  Service: Endoscopy;  Laterality: N/A;  8:55  . COLONOSCOPY WITH ESOPHAGOGASTRODUODENOSCOPY (EGD)    . CORONARY STENT INTERVENTION N/A 01/26/2019   Procedure: CORONARY STENT INTERVENTION;  Surgeon: Troy Sine, MD;  Location: [place] [facility] LAB;  Service: Cardiovascular;  Laterality: N/A;  . CORONARY/GRAFT ACUTE  MI REVASCULARIZATION N/A 01/26/2019   Procedure: Coronary/Graft Acute MI Revascularization;  Surgeon: Troy Sine, MD;  Location: [place] [facility] LAB;  Service: Cardiovascular;  Laterality: N/A;  . ESOPHAGOGASTRODUODENOSCOPY N/A 05/24/2018   Procedure: ESOPHAGOGASTRODUODENOSCOPY (EGD);  Surgeon: Rogene Houston, MD;  [facility];  Service: Endoscopy;  Laterality: N/A;  . LEFT HEART CATH AND CORONARY ANGIOGRAPHY N/A 01/26/2019   Procedure: LEFT HEART CATH AND CORONARY ANGIOGRAPHY;  Surgeon: Troy Sine, MD;  Location: [place] [facility];  Service: Cardiovascular;  Laterality: N/A;  . MEDIAL PARTIAL KNEE REPLACEMENT     2015  . POLYPECTOMY  05/24/2018   Procedure: POLYPECTOMY;  Surgeon: Rogene Houston, MD;  [facility];  Service: Endoscopy;;  colon  . rt hand injury    . shoulder surgery for bursitis    . TONSILLECTOMY      Family History  Problem Relation Age of Onset  . Colon cancer Neg Hx      Social History:  reports that he has been smoking cigarettes. He has a 30.00 pack-year smoking history. He has never used smokeless tobacco. He reports that he does not drink alcohol or use drugs.  Allergies: No Known Allergies  Medications:                                                                                                                           Scheduled: . aspirin  81 mg Oral Daily  . atorvastatin  80 mg Oral q1800  . buPROPion  150 mg Oral QAC lunch  .  buPROPion  300 mg Oral Daily  . carvedilol  3.125 mg Oral BID WC  . clopidogrel  75 mg Oral Daily  . escitalopram  20 mg Oral Daily  . folic acid  1 mg Oral Daily  . levothyroxine  125 mcg Oral QAC breakfast  . losartan  50 mg Oral Daily  . multivitamin with minerals  1 tablet Oral Daily  . pantoprazole  40 mg Oral QAC breakfast  . rivaroxaban  20 mg Oral Daily  . sodium chloride flush  3 mL Intravenous Q12H  . spironolactone  25 mg Oral Daily  . thiamine  100 mg Oral Daily   Continuous: . sodium chloride Stopped (01/26/19 2117)  . sodium chloride     ZRA:QTMAUQ chloride, acetaminophen, LORazepam, nitroGLYCERIN, ondansetron (ZOFRAN) IV, sodium chloride flush   ROS:  ROS was performed and is negative except as noted in HPI    General Examination:                                                                                                      Blood pressure 135/77, pulse 79, temperature 97.8 F (36.6 C), temperature source Oral, resp. rate 20, height 5\' 10"  (1.778 m), weight 108.9 kg, SpO2 95 %.  HEENT-  Normocephalic, no lesions, without obvious abnormality.  Normal external eye and conjunctiva. Cardiovascular- S1-S2 audible, pulses palpable throughout  Lungs-no rhonchi or wheezing noted, no excessive working breathing.  Saturations within normal limits on RA, sounds SOB. Abdomen- All 4 quadrants palpated and non tender Skin-warm and dry,intact  Neurological Examination Mental Status: Alert, oriented name/age/year/president/place. Thought month was 2023/01/21. Naming intact. Did display a bit of confusion during exam. he started talking about pills. Speech fluent without evidence of aphasia.  Able to follow commands without difficulty. Cranial Nerves: II:  Visual fields grossly normal,  III,IV, VI: ptosis not present, extra-ocular motions  intact bilaterally, pupils equal, round, reactive to light and accommodation V,VII: smile symmetric, facial light touch sensation normal bilaterally VIII: hearing normal bilaterally IX,X: uvula rises midline XI: bilateral shoulder shrug XII: midline tongue extension Motor: Right : Upper extremity   5/5  Left:     Upper extremity   5/5  Lower extremity   5/5   Lower extremity   5/5 Tone and bulk:normal tone throughout; no atrophy noted While he does score 5/5;  He is not exhibiting his full strength. Sensory:  light touch intact throughout, bilaterally Deep Tendon Reflexes: 2+ and symmetric biceps and patella Plantars: Right: downgoing   Left: downgoing Cerebellar: normal finger-to-nose,  normal heel-to-shin test Gait: deferred   Lab Results: Basic Metabolic Panel: Recent Labs  Lab 01/26/19 2056 01/27/19 0049  NA 140 139  K 3.6 3.9  CL 105 110  CO2 23 21*  GLUCOSE 128* 128*  BUN 14 12  CREATININE 1.27* 1.14  CALCIUM 10.0 9.3    CBC: Recent Labs  Lab 01/26/19 2056 01/27/19 0049  WBC 15.3* 14.0*  NEUTROABS 12.6*  --   HGB 17.0 15.7  HCT 50.8 45.8  MCV 92.5 90.5  PLT 231 193    Lipid Panel: Recent Labs  Lab 01/26/19 2056  CHOL 209*  TRIG 190*  HDL 37*  CHOLHDL 5.6  VLDL 38  LDLCALC 134*    CBG: Recent Labs  Lab 01/27/19 0239  GLUCAP 142*    AST: 223 ALT: 65  Imaging: No results found.     Laurey Morale, MSN, NP-C [facility] [phone]  01/29/2019, 11:00 AM   Attending physician note to follow with Assessment and plan . I have seen the patient and reviewed the above note.  Though there was some question about alcoholism, he emphatically denies that he drinks more than once or twice a week.  Of concern, he states that he has lost about 20 pounds since his wife died in early 2023/01/21 indicating significant decrease in p.o. intake.  On exam, though he has  mildly increased latency of finger-nose-finger on the right, not clearly  abnormal.  His gait is favoring his right leg, antalgic on the left.  He does not give good effort with holding himself up, so not clearly ataxic but definitely unsteady.  Assessment: 63 y.o. male with PMH HTN, HLD, sleep apnea ( with cpap) who presented to  APM with c/o chest pain on 3/18 and was transferred as a STEMI to [facility].  Over the course of his hospitalization he is been clear that he has had some confusion.  It is unclear how much of his unsteadiness is actually chronic as he tells me that he uses a walker at home.  With recent cardiac thrombus, I do think that an MRI to assess for small emboli, particularly midline cerebellar would be prudent.  Also, with his recent weight loss and the combination of gait unsteadiness and confusion, thiamine deficiency would have to be considered and I would favor high-dose thiamine.  Given the confusion, TSH, B12, RPR would also be prudent to check.  Stroke Risk Factors - hyperlipidemia and hypertension    Recommendations: --MRI Brain   -- thiamine 500 mg  TID x 2 days -- PT consult, OT consult,  --B12, TSH.  --please page stroke NP  Or  PA  Or MD from 8am -4 pm  as this patient from this time will be  followed by the stroke.   You can look them up on [email or web address]  Password [email or web address]  Roland Rack, MD [facility] [phone]  If 7pm- 7am, please page neurology on call as listed in [place].

## 2019-01-29 NOTE — Progress Notes (Addendum)
Progress Note  Patient Name: Tony Alvarez Date of Encounter: 01/29/2019  Primary Cardiologist: Shelva Majestic, MD   Subjective   Sleepy but easily arousable.  Did receive 2 mg of Ativan earlier this morning.  Yesterday had some trouble ambulating arms outstretched with walker according to nursing.  Coronary history only drinks 2 beers a day.  Brother from [place] is living with him currently.  Denies any chest pain, no shortness of breath.  Inpatient Medications    Scheduled Meds: . aspirin  81 mg Oral Daily  . atorvastatin  80 mg Oral q1800  . buPROPion  150 mg Oral QAC lunch  . buPROPion  300 mg Oral Daily  . carvedilol  3.125 mg Oral BID WC  . clopidogrel  75 mg Oral Daily  . escitalopram  20 mg Oral Daily  . folic acid  1 mg Oral Daily  . levothyroxine  125 mcg Oral QAC breakfast  . losartan  50 mg Oral Daily  . multivitamin with minerals  1 tablet Oral Daily  . pantoprazole  40 mg Oral QAC breakfast  . rivaroxaban  20 mg Oral Daily  . sodium chloride flush  3 mL Intravenous Q12H  . spironolactone  25 mg Oral Daily  . thiamine  100 mg Oral Daily   Continuous Infusions: . sodium chloride Stopped (01/26/19 2117)  . sodium chloride     PRN Meds: sodium chloride, acetaminophen, LORazepam, nitroGLYCERIN, ondansetron (ZOFRAN) IV, sodium chloride flush   Vital Signs    Vitals:   01/29/19 0728 01/29/19 0800 01/29/19 0802 01/29/19 0900  BP: 112/70 118/84 118/84 135/77  Pulse: 83 (!) 102 96 79  Resp: 20 20    Temp: 97.8 F (36.6 C)     TempSrc: Oral     SpO2: 98% 96%  95%  Weight:      Height:        Intake/Output Summary (Last 24 hours) at 01/29/2019 1016 Last data filed at 01/29/2019 0800 Gross per 24 hour  Intake 480 ml  Output 175 ml  Net 305 ml   Last 3 Weights 01/26/2019 05/24/2018 04/21/2018  Weight (lbs) 240 lb 250 lb 253 lb 12.8 oz  Weight (kg) 108.863 kg 113.399 kg 115.123 kg      Telemetry    Sinus rhythm/sinus bradycardia, no ventricular  tachycardia- Personally Reviewed  ECG    Anterolateral ST elevation persistent.- Personally Reviewed  Physical Exam   GEN: No acute distress.  Sleeping but easily arousable able to answer questions.  Asking when he is going to be able to go home. Neck: No JVD Cardiac: RRR, no murmurs, rubs, or gallops.  Respiratory: Clear to auscultation bilaterally. GI: Soft, nontender, non-distended  MS: No edema; No deformity. Neuro:  Nonfocal appearing currently. Psych: Normal affect   Labs    Chemistry Recent Labs  Lab 01/26/19 2056 01/27/19 0049 01/28/19 0429  NA 140 139  --   K 3.6 3.9  --   CL 105 110  --   CO2 23 21*  --   GLUCOSE 128* 128*  --   BUN 14 12  --   CREATININE 1.27* 1.14  --   CALCIUM 10.0 9.3  --   PROT 8.0 6.2* 6.2*  ALBUMIN 4.9 3.9 3.6  AST 66* 342* 223*  ALT 45* 77* 65*  ALKPHOS 67 66 48  BILITOT 1.4* 1.3* 2.0*  GFRNONAA >60 >60  --   GFRAA >60 >60  --   ANIONGAP 12 8  --  Hematology Recent Labs  Lab 01/26/19 2056 01/27/19 0049  WBC 15.3* 14.0*  RBC 5.49 5.06  HGB 17.0 15.7  HCT 50.8 45.8  MCV 92.5 90.5  MCH 31.0 31.0  MCHC 33.5 34.3  RDW 13.5 13.6  PLT 231 193    Cardiac Enzymes Recent Labs  Lab 01/26/19 2056 01/27/19 0049  TROPONINI 5.54* >65.00*    Recent Labs  Lab 01/26/19 2059  TROPIPOC 3.16*     BNPNo results for input(s): BNP, PROBNP in the last 168 hours.   DDimer No results for input(s): DDIMER in the last 168 hours.   Radiology    No results found.  Cardiac Studies   Echocardiogram - EF 35 to 40%, small fixed thrombus anteroapical wall  Diagnostic  Dominance: Right    Intervention         Patient Profile     63 y.o. male with acute anterolateral STEMI with PCI to the LAD with DES on 01/26/2019, [name].  Has small apical thrombus confirmed by [facility].  Mixed hyperlipidemia, recent delirium.  Acute systolic heart failure EF 35 to 40%  Assessment & Plan    ST elevation myocardial infarction  anterior lateral - Aggressive secondary risk factor prevention -PCI to LAD with DES.  Triple therapy with aspirin, Plavix (transition from ticagrelor 3/20), Xarelto 20. -Large MI troponin greater than 65.  Increased risk.  No adverse arrhythmias seen on telemetry, no ventricular tachycardia.  Acute systolic heart failure, ischemic cardiomyopathy - Carvedilol 3.125 twice a day, losartan 50 mg a day.  Spironolactone low-dose, potassium 3.9.  Currently appears euvolemic.  Hyperlipidemia -Atorvastatin 80, high intensity statin  Elevated liver enzymes - AST peak 342, ALT peak 77, trending downward.  Setting of MI.  Delirium - Yesterday was extremely anxious, rocking according to nurse.  Received 3 mg of Ativan yesterday.  Received 2 mg of Ativan earlier today.  Yesterday when ambulating hallway with nurse, his arms were outstretched with a walker and his gait was very unstable.  I will have neurology as well as PT assess him.  I have asked his nursing team to contact his brother who is here from [place] staying with him to ask about potential alcohol use.  The patient stated that he drinks about 2 beers a day.  He currently does not appear to have any focal neurologic abnormalities i.e. sequelae from apical thrombus.  However could his symptoms be somehow cerebellar in nature?  Dr. Leonel Ramsay with neurology will be seeing him.  Appreciate his assistance.  Will need PT evaluation after neurology sees him.  For questions or updates, please contact [place] Please consult [email or web address] for contact info under        Signed, Candee Furbish, MD  01/29/2019, 10:16 AM

## 2019-01-29 NOTE — Plan of Care (Signed)
  Problem: Spiritual Needs Goal: Ability to function at adequate level Outcome: Progressing   Problem: Education: Goal: Understanding of CV disease, CV risk reduction, and recovery process will improve Outcome: Progressing   Problem: Activity: Goal: Ability to return to baseline activity level will improve Outcome: Progressing   Problem: Cardiovascular: Goal: Ability to achieve and maintain adequate cardiovascular perfusion will improve Outcome: Progressing Goal: Vascular access site(s) Level 0-1 will be maintained Outcome: Progressing   Problem: Activity: Goal: Ability to tolerate increased activity will improve Outcome: Progressing

## 2019-01-30 DIAGNOSIS — F1010 Alcohol abuse, uncomplicated: Secondary | ICD-10-CM

## 2019-01-30 DIAGNOSIS — I5023 Acute on chronic systolic (congestive) heart failure: Secondary | ICD-10-CM

## 2019-01-30 DIAGNOSIS — I5021 Acute systolic (congestive) heart failure: Secondary | ICD-10-CM

## 2019-01-30 DIAGNOSIS — F10931 Alcohol use, unspecified with withdrawal delirium: Secondary | ICD-10-CM

## 2019-01-30 DIAGNOSIS — I255 Ischemic cardiomyopathy: Secondary | ICD-10-CM

## 2019-01-30 DIAGNOSIS — E785 Hyperlipidemia, unspecified: Secondary | ICD-10-CM

## 2019-01-30 DIAGNOSIS — F10231 Alcohol dependence with withdrawal delirium: Secondary | ICD-10-CM

## 2019-01-30 DIAGNOSIS — I2129 ST elevation (STEMI) myocardial infarction involving other sites: Secondary | ICD-10-CM

## 2019-01-30 DIAGNOSIS — R748 Abnormal levels of other serum enzymes: Secondary | ICD-10-CM

## 2019-01-30 LAB — BASIC METABOLIC PANEL
Anion gap: 8 (ref 5–15)
BUN: 21 mg/dL — ABNORMAL HIGH (ref 6–20)
CO2: 22 mmol/L (ref 22–32)
Calcium: 9.4 mg/dL (ref 8.9–10.3)
Chloride: 109 mmol/L (ref 98–111)
Creatinine, Ser: 1.24 mg/dL (ref 0.61–1.24)
GFR calc Af Amer: >60 mL/min (ref >60)
GFR calc non Af Amer: >60 mL/min (ref >60)
Glucose, Bld: 114 mg/dL — ABNORMAL HIGH (ref 70–99)
Potassium: 3.4 mmol/L — ABNORMAL LOW (ref 3.5–5.1)
Sodium: 139 mmol/L (ref 135–145)

## 2019-01-30 LAB — CBC
HCT: 39.8 % (ref 39.0–52.0)
HEMOGLOBIN: 13.1 g/dL (ref 13.0–17.0)
MCH: 30.1 pg (ref 26.0–34.0)
MCHC: 32.9 g/dL (ref 30.0–36.0)
MCV: 91.5 fL (ref 80.0–100.0)
Platelets: 184 10*3/uL (ref 150–400)
RBC: 4.35 MIL/uL (ref 4.22–5.81)
RDW: 13.8 % (ref 11.5–15.5)
WBC: 11.4 10*3/uL — ABNORMAL HIGH (ref 4.0–10.5)
nRBC: 0 % (ref 0.0–0.2)

## 2019-01-30 LAB — MAGNESIUM: Magnesium: 2.1 mg/dL (ref 1.7–2.4)

## 2019-01-30 MED ORDER — RIVAROXABAN 20 MG PO TABS: 20.0000 mg | ORAL_TABLET | Freq: Every day | ORAL | 0 refills | Status: DC

## 2019-01-30 MED ORDER — ATORVASTATIN CALCIUM 80 MG PO TABS: 80.0000 mg | ORAL_TABLET | Freq: Every day | ORAL | 3 refills | Status: DC

## 2019-01-30 MED ORDER — ASPIRIN 81 MG PO CHEW: 81.0000 mg | CHEWABLE_TABLET | Freq: Every day | ORAL | Status: DC

## 2019-01-30 MED ORDER — NITROGLYCERIN 0.4 MG SL SUBL: 0.4000 mg | SUBLINGUAL_TABLET | SUBLINGUAL | 3 refills | Status: AC | PRN

## 2019-01-30 MED ORDER — LOSARTAN POTASSIUM 50 MG PO TABS: 50.0000 mg | ORAL_TABLET | Freq: Every day | ORAL | 3 refills | Status: DC

## 2019-01-30 MED ORDER — SPIRONOLACTONE 25 MG PO TABS: 25.0000 mg | ORAL_TABLET | Freq: Every day | ORAL | 3 refills | Status: DC

## 2019-01-30 MED ORDER — CLOPIDOGREL BISULFATE 75 MG PO TABS: 75.0000 mg | ORAL_TABLET | Freq: Every day | ORAL | 3 refills | Status: DC

## 2019-01-30 MED ORDER — RIVAROXABAN 20 MG PO TABS: 20.0000 mg | ORAL_TABLET | Freq: Every day | ORAL | 11 refills | Status: DC

## 2019-01-30 MED ORDER — CARVEDILOL 3.125 MG PO TABS: 3.1250 mg | ORAL_TABLET | Freq: Two times a day (BID) | ORAL | 5 refills | Status: DC

## 2019-01-30 NOTE — Progress Notes (Signed)
Progress Note  Patient Name: Tony Alvarez Date of Encounter: 01/30/2019  Primary Cardiologist: Shelva Majestic, MD   Subjective   Sitting on edge of bed, comfortable.  Appreciate neurology consult yesterday.  MRI is negative.  He is going to have a PT assessment assessment soon.  Brother from [place] is currently living with him.  Stated previously that he drank 2 beers a day.  He has not had any further anxiety episodes.  Inpatient Medications    Scheduled Meds: . aspirin  81 mg Oral Daily  . atorvastatin  80 mg Oral q1800  . buPROPion  150 mg Oral QAC lunch  . buPROPion  300 mg Oral Daily  . carvedilol  3.125 mg Oral BID WC  . clopidogrel  75 mg Oral Daily  . escitalopram  20 mg Oral Daily  . folic acid  1 mg Oral Daily  . levothyroxine  125 mcg Oral QAC breakfast  . losartan  50 mg Oral Daily  . multivitamin with minerals  1 tablet Oral Daily  . pantoprazole  40 mg Oral QAC breakfast  . rivaroxaban  20 mg Oral Daily  . sodium chloride flush  3 mL Intravenous Q12H  . spironolactone  25 mg Oral Daily   Continuous Infusions: . sodium chloride Stopped (01/30/19 0949)  . sodium chloride    . thiamine injection 100 mL/hr at 01/30/19 1000   PRN Meds: sodium chloride, acetaminophen, LORazepam, nitroGLYCERIN, ondansetron (ZOFRAN) IV, sodium chloride flush   Vital Signs    Vitals:   01/30/19 0700 01/30/19 0800 01/30/19 0900 01/30/19 1000  BP:  111/75  92/63  Pulse: 93 79 85 83  Resp:      Temp:  98 F (36.7 C) 98.9 F (37.2 C)   TempSrc:  Oral Oral   SpO2: 94% 93% 95% 92%  Weight:      Height:        Intake/Output Summary (Last 24 hours) at 01/30/2019 1100 Last data filed at 01/30/2019 1000 Gross per 24 hour  Intake 1088.75 ml  Output -  Net 1088.75 ml   Last 3 Weights 01/26/2019 05/24/2018 04/21/2018  Weight (lbs) 240 lb 250 lb 253 lb 12.8 oz  Weight (kg) 108.863 kg 113.399 kg 115.123 kg      Telemetry    Normal sinus rhythm with interventricular conduction  delay, no adverse arrhythmias- Personally Reviewed  ECG    No new EKG, prior EKG showed anterolateral ST elevation persistent- Personally Reviewed  Physical Exam   GEN: No acute distress.  Sitting on edge of bed, comfortable Cardiac:  Cardiac telemetry shows sinus rhythm interventricular conduction delay.  Respiratory: Normal respiratory effort GI: non-distended  MS: No deformity.  Prior cath site has been assessed and is normal Neuro:  Nonfocal  Psych: Normal affect   Labs    Chemistry Recent Labs  Lab 01/26/19 2056 01/27/19 0049 01/28/19 0429 01/30/19 0325  NA 140 139  --  139  K 3.6 3.9  --  3.4*  CL 105 110  --  109  CO2 23 21*  --  22  GLUCOSE 128* 128*  --  114*  BUN 14 12  --  21*  CREATININE 1.27* 1.14  --  1.24  CALCIUM 10.0 9.3  --  9.4  PROT 8.0 6.2* 6.2*  --   ALBUMIN 4.9 3.9 3.6  --   AST 66* 342* 223*  --   ALT 45* 77* 65*  --   ALKPHOS 67 66 48  --  BILITOT 1.4* 1.3* 2.0*  --   GFRNONAA >60 >60  --  >60  GFRAA >60 >60  --  >60  ANIONGAP 12 8  --  8     Hematology Recent Labs  Lab 01/26/19 2056 01/27/19 0049 01/30/19 0325  WBC 15.3* 14.0* 11.4*  RBC 5.49 5.06 4.35  HGB 17.0 15.7 13.1  HCT 50.8 45.8 39.8  MCV 92.5 90.5 91.5  MCH 31.0 31.0 30.1  MCHC 33.5 34.3 32.9  RDW 13.5 13.6 13.8  PLT 231 193 184    Cardiac Enzymes Recent Labs  Lab 01/26/19 2056 01/27/19 0049  TROPONINI 5.54* >65.00*    Recent Labs  Lab 01/26/19 2059  TROPIPOC 3.16*     BNPNo results for input(s): BNP, PROBNP in the last 168 hours.   DDimer No results for input(s): DDIMER in the last 168 hours.   Radiology    Ct Head Wo Contrast  Result Date: 01/29/2019 CLINICAL DATA:  Status post STEMI.  Encephalopathy. EXAM: CT HEAD WITHOUT CONTRAST TECHNIQUE: Contiguous axial images were obtained from the base of the skull through the vertex without intravenous contrast. COMPARISON:  None. FINDINGS: Brain: No acute infarct, hemorrhage, or mass lesion is present.  The ventricles are of normal size. No significant white matter lesions are present. No significant extraaxial fluid collection is present. The brainstem and cerebellum are within normal limits. Vascular: No hyperdense vessel or unexpected calcification. Skull: Calvarium is intact. No focal lytic or blastic lesions are present. Sinuses/Orbits: The paranasal sinuses and mastoid air cells are clear. The globes and orbits are within normal limits. IMPRESSION: Negative CT of the head. Electronically Signed   By: San Morelle M.D.   On: 01/29/2019 19:55   Mr Brain Wo Contrast  Result Date: 01/29/2019 CLINICAL DATA:  Confusion and unsteady gait following STEMI. EXAM: MRI HEAD WITHOUT CONTRAST TECHNIQUE: Multiplanar, multiecho pulse sequences of the brain and surrounding structures were obtained without intravenous contrast. COMPARISON:  CT head without contrast 01/29/2019 FINDINGS: Brain: No acute infarct, hemorrhage, or mass lesion is present. No significant white matter lesions are present. The ventricles are of normal size. No significant extraaxial fluid collection is present. The brainstem and cerebellum are within normal limits. Vascular: Flow is present in the major intracranial arteries. Skull and upper cervical spine: The craniocervical junction is normal. Upper cervical spine is within normal limits. Marrow signal is unremarkable. Sinuses/Orbits: The paranasal sinuses and mastoid air cells are clear. The globes and orbits are within normal limits. IMPRESSION: Negative MRI of brain. Electronically Signed   By: San Morelle M.D.   On: 01/29/2019 20:08    Cardiac Studies   Diagnostic  Dominance: Right    Intervention         Patient Profile     63 y.o. male acute anterolateral STEMI with PCI to the LAD with DES on 01/26/2019, [name].  Has small apical thrombus confirmed by [facility].  Mixed hyperlipidemia, recent delirium.  Acute systolic heart failure EF 35 to 40%   Assessment & Plan    ST elevation myocardial infarction anterior lateral with PCI to LAD - Currently on triple therapy with Xarelto, aspirin, Plavix because of apical thrombus noted on echocardiogram. -Troponin was greater than 65, large MI.    Acute systolic heart failure with ischemic cardiomyopathy -On losartan 50 mg, low-dose carvedilol 3.125 twice a day a day low-dose spironolactone, potassium 3.4, creatinine 1.24 -Unable to increase carvedilol at this point secondary to blood pressures that are ranging from 92/63-139/84 -Hopefully  this can be done at a later visit.  Chronic anticoagulation in the setting of apical thrombus -Triple therapy noted.  Xarelto.  No bleeding.  Hemoglobin is stable.  In 6 months may be helpful to repeat echocardiogram to assess for apical thrombus and to assess his ejection fraction.  Hyperlipidemia -Atorvastatin 80 high intensity statin  Elevated liver enzymes - Increased in the setting of MI.  AST was greater than ALT.  Trending downward.  Delirium - 2 days ago was extremely anxious rocking, received 3 mg of Ativan then 2 mg of Ativan after that.  Felt somewhat unstable with ambulation.  Neurology saw him.  Apparently he does use a walker at home.  Unfortunately his wife recently died. - MRI overall was reassuring. -Neurology recommends supportive care at this time.  Overall neurologically intact.  If PT is okay with him going home, we will try to discharge later on today.   For questions or updates, please contact [place] Please consult [email or web address] for contact info under        Signed, Candee Furbish, MD  01/30/2019, 11:00 AM

## 2019-01-30 NOTE — Discharge Summary (Addendum)
Discharge Summary    Patient ID: Tony Alvarez MRN: [id number]; DOB: Feb 27, 1956  Admit date: 01/26/2019 Discharge date: 01/30/2019  Primary Care Provider: [facility]  Primary Cardiologist: Shelva Majestic, MD  Primary Electrophysiologist:  None   Discharge Diagnoses    Principal Problem:   STEMI involving left anterior descending coronary artery [facility]) Active Problems:   STEMI (ST elevation myocardial infarction) ([place])   Acute ST elevation myocardial infarction (STEMI) involving left anterior descending (LAD) coronary artery (HCC)   Hyperlipidemia   LV (left ventricular) mural thrombus with acute MI [facility])   Alcohol abuse   Alcohol withdrawal delirium ([place])   Elevated liver enzymes   Ischemic cardiomyopathy   Acute systolic heart failure (HCC)   Allergies No Known Allergies  Diagnostic Studies/Procedures    Cath 01/26/2019  Prox RCA lesion is 20% stenosed.  Prox Cx lesion is 20% stenosed.  Prox LAD-1 lesion is 100% stenosed.  Post intervention, there is a 0% residual stenosis.  Prox LAD-2 lesion is 20% stenosed.  Mid LAD lesion is 40% stenosed.  LV end diastolic pressure is mildly elevated.  There is moderate left ventricular systolic dysfunction.  A stent was successfully placed.   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  Almost a common ostium left main with immediate trifurcation into the LAD, small ramus intermediate vessel and moderate size circumflex vessel.  The ramus vessel is normal.  The circumflex vessel has 20% proximal narrowing and gave rise to 2 marginal branches.  The RCA is a dominant vessel with moderate luminal irregularity with 20% narrowing proximally 40% mid stenosis and 30% narrowing proximal to the acute margin.  Probable moderate acute LV dysfunction with hypocontractility involving  the anterolateral wall and apex.  LVEDP 22 mm.  Successful PCI to the totally occluded LAD with PTCA and ultimate insertion of a 3.0 x 22 mm Resolute Onyx stent postdilated to 3.26 mm with the 100% occlusion being reduced to 0% and TIMI 0 flow improving to TIMI-3 flow.  Once reperfusion was established, LAD had 20% mid LAD smooth narrowing and 40% distal smooth stenosis.  The LAD wrapped around the apex and supplied the distal third of the inferior wall.  RECOMMENDATION: DAPT for minimum of 1 year.  Optimal blood pressure control with ideal blood pressure less than 120/80.  High potency statin therapy with target LDL less than 70.  Medical therapy for concomitant CAD.  A 2D echo Doppler study will be ordered improved LV function assessment.   Echo 01/27/2019 IMPRESSIONS    1. The left ventricle has moderately reduced systolic function, with an ejection fraction of 35-40%. There is mildly increased left ventricular wall thickness.  2. Severe akinesis of the left ventricular, mid-apical anteroseptal wall, anterior wall, apical segment and inferoapical wall.  3. Small, fixed thrombus on the anteroapical wall of the left ventricle.  _____________   History of Present Illness     Tony Alvarez is a 63 y.o. male with history of HTN, HLD, who presents with acute CP.  Pt was in [place] until this afternoon, when he felt the acute onset of chest pain radiating to the back while raking leaves.  This pain persisted and he presented to [facility] for evaluation.  There, he was hemodynamically stable;  12 lead ECG showed anterior/anterolateral Q waves with STE and inferior Q waves.  He was given full dose  ASA, IV morphine, 4000 units IV heparin and transferred directly to the [facility] cath lab as a code STEMI activation.  Hospital Course     Consultants: Dr. Leonel Ramsay of neurology  Patient underwent urgent cardiac catheterization on 01/26/2019 which revealed 20% proximal RCA, 20% proximal left circumflex  lesion, 100% proximal LAD occlusion treated with DES, 40% mid LAD lesion.  LVEDP was 22 mmHg.  There was moderate left ventricular systolic dysfunction.  Postprocedure patient was placed on aspirin and Brilinta.  Echocardiogram obtained on 01/27/2019 showed EF 35 to 40%, severe akinesis of the left ventricular, mid apical anteroseptal wall, anterior wall, apical segment and inferoapical wall, small fixed thrombus in the anterior apical wall of the left ventricle.  He was placed on Xarelto for the LV thrombus. He had a delirium concerning for alcohol withdrawal during this hospitalization and received benzodiazepine.  He also had elevated liver enzyme in the setting of acute MI.  AST was greater than ALT.  Liver enzyme was downtrending.  MRI of the brain obtained during this hospitalization was negative.  Neurology recommended supportive care.   With regard to his LV thrombus, the current plan is to repeat echocardiogram in 6 months.  Patient was evaluated by physical therapy and Occupational Therapy who recommended a home health OT and PT.  He also require 24-hour supervision.  He is living with his brother at this point.   _____________  Discharge Vitals Blood pressure 105/84, pulse 80, temperature 98.1 F (36.7 C), temperature source Oral, resp. rate 20, height 5\' 10"  (1.778 m), weight 108.9 kg, SpO2 96 %.  Filed Weights   01/26/19 2059  Weight: 108.9 kg    Labs & Radiologic Studies    CBC Recent Labs    01/30/19 0325  WBC 11.4*  HGB 13.1  HCT 39.8  MCV 91.5  PLT 409   Basic Metabolic Panel Recent Labs    01/30/19 0325  NA 139  K 3.4*  CL 109  CO2 22  GLUCOSE 114*  BUN 21*  CREATININE 1.24  CALCIUM 9.4  MG 2.1   Liver Function Tests Recent Labs    01/28/19 0429  AST 223*  ALT 65*  ALKPHOS 48  BILITOT 2.0*  PROT 6.2*  ALBUMIN 3.6   No results for input(s): LIPASE, AMYLASE in the last 72 hours. Cardiac Enzymes No results for input(s): CKTOTAL, CKMB, CKMBINDEX,  TROPONINI in the last 72 hours. BNP Invalid input(s): POCBNP D-Dimer No results for input(s): DDIMER in the last 72 hours. Hemoglobin A1C No results for input(s): HGBA1C in the last 72 hours. Fasting Lipid Panel No results for input(s): CHOL, HDL, LDLCALC, TRIG, CHOLHDL, LDLDIRECT in the last 72 hours. Thyroid Function Tests Recent Labs    01/29/19 1555  TSH 4.386   _____________  Ct Head Wo Contrast  Result Date: 01/29/2019 CLINICAL DATA:  Status post STEMI.  Encephalopathy. EXAM: CT HEAD WITHOUT CONTRAST TECHNIQUE: Contiguous axial images were obtained from the base of the skull through the vertex without intravenous contrast. COMPARISON:  None. FINDINGS: Brain: No acute infarct, hemorrhage, or mass lesion is present. The ventricles are of normal size. No significant white matter lesions are present. No significant extraaxial fluid collection is present. The brainstem and cerebellum are within normal limits. Vascular: No hyperdense vessel or unexpected calcification. Skull: Calvarium is intact. No focal lytic or blastic lesions are present. Sinuses/Orbits: The paranasal sinuses and mastoid air cells are clear. The globes and orbits are within normal limits. IMPRESSION: Negative CT of the head.  Electronically Signed   By: San Morelle M.D.   On: 01/29/2019 19:55   Mr Brain Wo Contrast  Result Date: 01/29/2019 CLINICAL DATA:  Confusion and unsteady gait following STEMI. EXAM: MRI HEAD WITHOUT CONTRAST TECHNIQUE: Multiplanar, multiecho pulse sequences of the brain and surrounding structures were obtained without intravenous contrast. COMPARISON:  CT head without contrast 01/29/2019 FINDINGS: Brain: No acute infarct, hemorrhage, or mass lesion is present. No significant white matter lesions are present. The ventricles are of normal size. No significant extraaxial fluid collection is present. The brainstem and cerebellum are within normal limits. Vascular: Flow is present in the major  intracranial arteries. Skull and upper cervical spine: The craniocervical junction is normal. Upper cervical spine is within normal limits. Marrow signal is unremarkable. Sinuses/Orbits: The paranasal sinuses and mastoid air cells are clear. The globes and orbits are within normal limits. IMPRESSION: Negative MRI of brain. Electronically Signed   By: San Morelle M.D.   On: 01/29/2019 20:08   Dg Chest Port 1 View  Result Date: 01/26/2019 CLINICAL DATA:  Patient states thoracic back pain that radiates into mid chest since 1500HRS today. Hx of HTN and current smoker. EXAM: PORTABLE CHEST 1 VIEW COMPARISON:  None. FINDINGS: Cardiac silhouette is normal in size. No mediastinal masses. There is irregular opacity extending inferiorly from the right hilum toward the central right lower lobe. Left hilar contours normal. Lungs are hyperexpanded. Remainder of the lungs is clear. No pleural effusion or pneumothorax. Skeletal structures are grossly intact. IMPRESSION: 1. Opacity extending inferiorly from the right hilum. This could reflect a mass or be due to infection or inflammation. Recommend follow-up chest CT with contrast for further assessment. Electronically Signed   By: Lajean Manes M.D.   On: 01/26/2019 21:23   Disposition   Pt is being discharged home today in good condition.  Follow-up Plans & Appointments    Follow-up Information    Troy Sine, MD Follow up.   Specialty:  Cardiology Why:  office scheduler will contact you to arrange followup, please give [place] a call if you do not hear from [place] in 3 business days.  Contact information: [address] [place] [address] [phone]        A, [place] Schedule an appointment as soon as possible for a visit.   Contact information: [address] [place] [address] [phone]          Discharge Instructions    AMB Referral to Cardiac Rehabilitation - Phase II   Complete by:  As directed     Diagnosis:  STEMI   Diet - low sodium heart healthy   Complete by:  As directed    Increase activity slowly   Complete by:  As directed       Discharge Medications   Allergies as of 01/30/2019   No Known Allergies     Medication List    STOP taking these medications   amLODipine 5 MG tablet Commonly known as:  NORVASC   pravastatin 20 MG tablet Commonly known as:  PRAVACHOL     TAKE these medications   ARIPiprazole 10 MG tablet Commonly known as:  ABILIFY Take 10 mg by mouth every evening.   aspirin 81 MG chewable tablet Chew 1 tablet (81 mg total) by mouth daily. Start taking on:  January 31, 2019   atorvastatin 80 MG tablet Commonly known as:  LIPITOR Take 1 tablet (80 mg total) by mouth daily at 6 PM.  buPROPion 150 MG 12 hr tablet Commonly known as:  WELLBUTRIN SR Take 150-300 mg by mouth See admin instructions. Take 300mg  by mouth in the morning and 150mg  at lunch   carvedilol 3.125 MG tablet Commonly known as:  COREG Take 1 tablet (3.125 mg total) by mouth 2 (two) times daily with a meal.   clopidogrel 75 MG tablet Commonly known as:  PLAVIX Take 1 tablet (75 mg total) by mouth daily. Start taking on:  January 31, 2019   escitalopram 20 MG tablet Commonly known as:  LEXAPRO Take 20 mg by mouth daily.   levothyroxine 125 MCG tablet Commonly known as:  SYNTHROID, LEVOTHROID Take 125 mcg by mouth daily before breakfast.   losartan 50 MG tablet Commonly known as:  COZAAR Take 1 tablet (50 mg total) by mouth daily. Start taking on:  January 31, 2019 What changed:    medication strength  how much to take   nitroGLYCERIN 0.4 MG SL tablet Commonly known as:  NITROSTAT Place 1 tablet (0.4 mg total) under the tongue every 5 (five) minutes x 3 doses as needed for chest pain.   pantoprazole 40 MG tablet Commonly known as:  PROTONIX Take 1 tablet (40 mg total) by mouth daily before breakfast.   rivaroxaban 20 MG Tabs tablet Commonly known as:   XARELTO Take 1 tablet (20 mg total) by mouth daily. Start taking on:  January 31, 2019   spironolactone 25 MG tablet Commonly known as:  ALDACTONE Take 1 tablet (25 mg total) by mouth daily. Start taking on:  January 31, 2019        Acute coronary syndrome (MI, NSTEMI, STEMI, etc) this admission?: Yes.     AHA/ACC Clinical Performance & Quality Measures: 1. Aspirin prescribed? - Yes 2. ADP Receptor Inhibitor (Plavix/Clopidogrel, Brilinta/Ticagrelor or Effient/Prasugrel) prescribed (includes medically managed patients)? - Yes 3. Beta Blocker prescribed? - Yes 4. High Intensity Statin (Lipitor 40-80mg  or Crestor 20-40mg ) prescribed? - Yes 5. EF assessed during THIS hospitalization? - Yes 6. For EF <40%, was ACEI/ARB prescribed? - Yes 7. For EF <40%, Aldosterone Antagonist (Spironolactone or Eplerenone) prescribed? - Yes 8. Cardiac Rehab Phase II ordered (Included Medically managed Patients)? - Yes     Outstanding Labs/Studies   FLP/LFT and BMET  Duration of Discharge Encounter   Greater than 30 minutes including physician time.  Hilbert Corrigan, [place] 01/30/2019, 3:13 PM  Cardiologist: Shelva Majestic, MD   Subjective   Sitting on edge of bed, comfortable.  Appreciate neurology consult yesterday.  MRI is negative.  He is going to have a PT assessment assessment soon.  Brother from [place] is currently living with him.  Stated previously that he drank 2 beers a day.  He has not had any further anxiety episodes.  Inpatient Medications    Scheduled Meds: . aspirin  81 mg Oral Daily  . atorvastatin  80 mg Oral q1800  . buPROPion  150 mg Oral QAC lunch  . buPROPion  300 mg Oral Daily  . carvedilol  3.125 mg Oral BID WC  . clopidogrel  75 mg Oral Daily  . escitalopram  20 mg Oral Daily  . folic acid  1 mg Oral Daily  . levothyroxine  125 mcg Oral QAC breakfast  . losartan  50 mg Oral Daily  . multivitamin with minerals  1 tablet Oral Daily  . pantoprazole  40 mg Oral QAC  breakfast  . rivaroxaban  20 mg Oral Daily  . sodium chloride flush  3 mL Intravenous Q12H  . spironolactone  25 mg Oral Daily   Continuous Infusions: . sodium chloride Stopped (01/30/19 0949)  . sodium chloride    . thiamine injection 100 mL/hr at 01/30/19 1000   PRN Meds: sodium chloride, acetaminophen, LORazepam, nitroGLYCERIN, ondansetron (ZOFRAN) IV, sodium chloride flush   Vital Signs          Vitals:   01/30/19 0700 01/30/19 0800 01/30/19 0900 01/30/19 1000  BP:  111/75  92/63  Pulse: 93 79 85 83  Resp:      Temp:  98 F (36.7 C) 98.9 F (37.2 C)   TempSrc:  Oral Oral   SpO2: 94% 93% 95% 92%  Weight:      Height:        Intake/Output Summary (Last 24 hours) at 01/30/2019 1100 Last data filed at 01/30/2019 1000    Gross per 24 hour  Intake 1088.75 ml  Output -  Net 1088.75 ml   Last 3 Weights 01/26/2019 05/24/2018 04/21/2018  Weight (lbs) 240 lb 250 lb 253 lb 12.8 oz  Weight (kg) 108.863 kg 113.399 kg 115.123 kg      Telemetry    Normal sinus rhythm with interventricular conduction delay, no adverse arrhythmias- Personally Reviewed  ECG    No new EKG, prior EKG showed anterolateral ST elevation persistent- Personally Reviewed  Physical Exam   GEN:No acute distress.  Sitting on edge of bed, comfortable Cardiac: Cardiac telemetry shows sinus rhythm interventricular conduction delay.  Respiratory: Normal respiratory effort GI:non-distended  MS:No deformity.  Prior cath site has been assessed and is normal Neuro:Nonfocal  Psych: Normal affect   Labs    Chemistry LastLabs        Recent Labs  Lab 01/26/19 2056 01/27/19 0049 01/28/19 0429 01/30/19 0325  NA 140 139  --  139  K 3.6 3.9  --  3.4*  CL 105 110  --  109  CO2 23 21*  --  22  GLUCOSE 128* 128*  --  114*  BUN 14 12  --  21*  CREATININE 1.27* 1.14  --  1.24  CALCIUM 10.0 9.3  --  9.4  PROT 8.0 6.2* 6.2*  --   ALBUMIN 4.9 3.9 3.6  --   AST 66* 342*  223*  --   ALT 45* 77* 65*  --   ALKPHOS 67 66 48  --   BILITOT 1.4* 1.3* 2.0*  --   GFRNONAA >60 >60  --  >60  GFRAA >60 >60  --  >60  ANIONGAP 12 8  --  8       Hematology LastLabs  Recent Labs  Lab 01/26/19 2056 01/27/19 0049 01/30/19 0325  WBC 15.3* 14.0* 11.4*  RBC 5.49 5.06 4.35  HGB 17.0 15.7 13.1  HCT 50.8 45.8 39.8  MCV 92.5 90.5 91.5  MCH 31.0 31.0 30.1  MCHC 33.5 34.3 32.9  RDW 13.5 13.6 13.8  PLT 231 193 184      Cardiac Enzymes LastLabs      Recent Labs  Lab 01/26/19 2056 01/27/19 0049  TROPONINI 5.54* >65.00*      LastLabs     Recent Labs  Lab 01/26/19 2059  TROPIPOC 3.16*       BNP LastLabs  No results for input(s): BNP, PROBNP in the last 168 hours.     DDimer  Elie Confer  No results for input(s): DDIMER in the last 168 hours.     Radiology     ImagingResults(Last48hours)  Ct Head Wo Contrast  Result Date: 01/29/2019 CLINICAL DATA:  Status post STEMI.  Encephalopathy. EXAM: CT HEAD WITHOUT CONTRAST TECHNIQUE: Contiguous axial images were obtained from the base of the skull through the vertex without intravenous contrast. COMPARISON:  None. FINDINGS: Brain: No acute infarct, hemorrhage, or mass lesion is present. The ventricles are of normal size. No significant white matter lesions are present. No significant extraaxial fluid collection is present. The brainstem and cerebellum are within normal limits. Vascular: No hyperdense vessel or unexpected calcification. Skull: Calvarium is intact. No focal lytic or blastic lesions are present. Sinuses/Orbits: The paranasal sinuses and mastoid air cells are clear. The globes and orbits are within normal limits. IMPRESSION: Negative CT of the head. Electronically Signed   By: San Morelle M.D.   On: 01/29/2019 19:55   Mr Brain Wo Contrast  Result Date: 01/29/2019 CLINICAL DATA:  Confusion and unsteady gait following STEMI. EXAM: MRI HEAD WITHOUT CONTRAST TECHNIQUE:  Multiplanar, multiecho pulse sequences of the brain and surrounding structures were obtained without intravenous contrast. COMPARISON:  CT head without contrast 01/29/2019 FINDINGS: Brain: No acute infarct, hemorrhage, or mass lesion is present. No significant white matter lesions are present. The ventricles are of normal size. No significant extraaxial fluid collection is present. The brainstem and cerebellum are within normal limits. Vascular: Flow is present in the major intracranial arteries. Skull and upper cervical spine: The craniocervical junction is normal. Upper cervical spine is within normal limits. Marrow signal is unremarkable. Sinuses/Orbits: The paranasal sinuses and mastoid air cells are clear. The globes and orbits are within normal limits. IMPRESSION: Negative MRI of brain. Electronically Signed   By: San Morelle M.D.   On: 01/29/2019 20:08     Cardiac Studies   Diagnostic  Dominance: Right   Intervention        Patient Profile     63 y.o. male acute anterolateral STEMI with PCI to the LAD with DES on 01/26/2019, [name].Has small apical thrombus confirmed by [facility]. Mixed hyperlipidemia, recent delirium. Acute systolic heart failure EF 35 to 40%  Assessment & Plan    ST elevation myocardial infarction anterior lateral with PCI to LAD - Currently on triple therapy with Xarelto, aspirin, Plavix because of apical thrombus noted on echocardiogram. -Troponin was greater than 65, large MI.    Acute systolic heart failure with ischemic cardiomyopathy -On losartan 50 mg, low-dose carvedilol 3.125 twice a day a day low-dose spironolactone, potassium 3.4, creatinine 1.24 -Unable to increase carvedilol at this point secondary to blood pressures that are ranging from 92/63-139/84 -Hopefully this can be done at a later visit.  Chronic anticoagulation in the setting of apical thrombus -Triple therapy noted.  Xarelto.  No bleeding.  Hemoglobin is  stable.  In 6 months may be helpful to repeat echocardiogram to assess for apical thrombus and to assess his ejection fraction.  Hyperlipidemia -Atorvastatin 80 high intensity statin  Elevated liver enzymes - Increased in the setting of MI.  AST was greater than ALT.  Trending downward.  Delirium - 2 days ago was extremely anxious rocking, received 3 mg of Ativan then 2 mg of Ativan after that.  Felt somewhat unstable with ambulation.  Neurology saw him.  Apparently he does use a walker at home.  Unfortunately his wife recently died. - MRI overall was reassuring. -Neurology recommends supportive care at this time.  Overall neurologically intact.  Appreciate PT and OT assessment-24-hour assistance with home health.  Candee Furbish, MD

## 2019-01-30 NOTE — Evaluation (Signed)
Physical Therapy Evaluation Patient Details Name: Tony Alvarez MRN: [id number] DOB: February 02, 1956 Today's Date: 01/30/2019   History of Present Illness  Pt is a 63 year old man with PMH of HTN, sleep apnea with CPAP, depression admitted to [facility] on 3/18 with chest pain/STEMI, transferred to [facility] Hospital course complicated by confusion and agitation, MRI negative for acute changes. Of note: pt's wife died in 01/31/2023   Clinical Impression  Orders received for PT evaluation. Patient demonstrates deficits in functional mobility as indicated below. Will benefit from continued skilled PT to address deficits and maximize function. Will see as indicated and progress as tolerated.  OF NOTE: Patient still with very concerning RLE deficits during mobility increased patient risks for fall and injury. Patient also with some concerning cognitive awareness. Feel patient will need post acute follow up and supervision/assist for mobility at home.    Follow Up Recommendations Home health PT;Supervision/Assistance - 24 hour    Equipment Recommendations  Cane    Recommendations for Other Services       Precautions / Restrictions Precautions Precautions: Fall Precaution Comments: difficulty clearing R foot Restrictions Weight Bearing Restrictions: No      Mobility  Bed Mobility Overal bed mobility: Independent             General bed mobility comments: from flat bed  Transfers Overall transfer level: Needs assistance Equipment used: None Transfers: Sit to/from Stand Sit to Stand: Supervision         General transfer comment: Supervision for safety  Ambulation/Gait Ambulation/Gait assistance: Min assist(2 episodes of moderate assist) Gait Distance (Feet): 180 Feet Assistive device: 1 person hand held assist Gait Pattern/deviations: Step-through pattern;Decreased stride length;Decreased dorsiflexion - right;Decreased step length - right;Shuffle;Ataxic;Trunk flexed Gait velocity:  decreased Gait velocity interpretation: <1.8 ft/sec, indicate of risk for recurrent falls General Gait Details: patient with significant impairment in ability to safely control RLE upon fatigue, felxed posture with RLE lag noted  Stairs            Wheelchair Mobility    Modified Rankin (Stroke Patients Only)       Balance Overall balance assessment: Needs assistance   Sitting balance-Leahy Scale: Good       Standing balance-Leahy Scale: Fair               High level balance activites: Head turns High Level Balance Comments: min assist Standardized Balance Assessment Standardized Balance Assessment : Dynamic Gait Index   Dynamic Gait Index Level Surface: Mild Impairment Change in Gait Speed: Moderate Impairment Gait with Horizontal Head Turns: Mild Impairment Gait with Vertical Head Turns: Mild Impairment Gait and Pivot Turn: Moderate Impairment Step Over Obstacle: Moderate Impairment Step Around Obstacles: Mild Impairment Steps: Moderate Impairment Total Score: 12       Pertinent Vitals/Pain Pain Assessment: No/denies pain    Home Living Family/patient expects to be discharged to:: Private residence Living Arrangements: Alone Available Help at Discharge: Family;Available 24 hours/day(brother is in town from [place]) Type of Home: House Home Access: Stairs to enter Entrance Stairs-Rails: Right Entrance Stairs-Number of Steps: 5 Home Layout: One level;Other (Comment)(one step down into den) Home Equipment: Kasandra Knudsen - single point      Prior Function Level of Independence: Independent         Comments: retired, likes to [employer], reports he has been sedentary x 2 months     Hand Dominance   Dominant Hand: Right    Extremity/Trunk Assessment   Upper Extremity Assessment Upper Extremity Assessment: Overall  WFL for tasks assessed    Lower Extremity Assessment Lower Extremity Assessment: RLE deficits/detail RLE Deficits / Details: functional  weakness with limited hip flexion, knee flexion and dorsiflex during mobility, RLE lag. However, upon testing, remains 5/5 RLE Coordination: decreased gross motor    Cervical / Trunk Assessment Cervical / Trunk Assessment: Normal  Communication   Communication: No difficulties  Cognition Arousal/Alertness: Awake/alert Behavior During Therapy: WFL for tasks assessed/performed Overall Cognitive Status: Impaired/Different from baseline Area of Impairment: Orientation;Safety/judgement                 Orientation Level: Time       Safety/Judgement: Decreased awareness of deficits     General Comments: pt tearful when speaking about the death of his wife      General Comments      Exercises     Assessment/Plan    PT Assessment Patient needs continued PT services  PT Problem List Decreased activity tolerance;Decreased balance;Decreased mobility;Decreased coordination;Decreased cognition       PT Treatment Interventions DME instruction;Gait training;Stair training;Functional mobility training;Therapeutic activities;Therapeutic exercise;Balance training;Neuromuscular re-education;Cognitive remediation;Patient/family education    PT Goals (Current goals can be found in the Care Plan section)  Acute Rehab PT Goals Patient Stated Goal: return home PT Goal Formulation: With patient Time For Goal Achievement: 02/13/19 Potential to Achieve Goals: Good    Frequency Min 3X/week   Barriers to discharge        Co-evaluation               AM-PAC PT "6 Clicks" Mobility  Outcome Measure Help needed turning from your back to your side while in a flat bed without using bedrails?: None Help needed moving from lying on your back to sitting on the side of a flat bed without using bedrails?: None Help needed moving to and from a bed to a chair (including a wheelchair)?: None Help needed standing up from a chair using your arms (e.g., wheelchair or bedside chair)?:  None Help needed to walk in hospital room?: A Little Help needed climbing 3-5 steps with a railing? : A Lot 6 Click Score: 21    End of Session Equipment Utilized During Treatment: Gait belt;Oxygen Activity Tolerance: Patient tolerated treatment well;Patient limited by fatigue Patient left: in chair;with call bell/phone within reach;with chair alarm set Nurse Communication: Mobility status PT Visit Diagnosis: Unsteadiness on feet (R26.81);Other abnormalities of gait and mobility (R26.89)    Time: 1021-1173 PT Time Calculation (min) (ACUTE ONLY): 18 min   Charges:   PT Evaluation $PT Eval Moderate Complexity: 1 Mod          Alben Deeds, PT DPT  Board Certified Neurologic Specialist Acute Rehabilitation Services Pager [phone] Office [place] 01/30/2019, 1:32 PM

## 2019-01-30 NOTE — Evaluation (Signed)
Occupational Therapy Evaluation Patient Details Name: Tony Alvarez MRN: [id number] DOB: [birth year] Today's Date: 01/30/2019    History of Present Illness Pt is a 63 year old man with PMH of HTN, sleep apnea with CPAP, depression admitted to [facility] on 3/18 with chest pain/STEMI, transferred to [facility]. Hospital course complicated by confusion and agitation, MRI negative for acute changes. Of note: pt's wife died in 01/30/2023 and pt uses RW at baseline.   Clinical Impression   Pt was independent prior to admission. Tearful when speaking of his wife who recently died of a brain aneurysm. Reports he has been sedentary since her death in 2023/01/30. Pt tests with with normal strength in all extremities, but noted to have difficulty clearing R LE with ambulation and B LEs became weak as pt was completing his walk around unit. Pt is at risk for falling. Will follow acutely. Recommending home with 24 hour assist of his brother. Recommending HHOT to address IADL and home safety.    Follow Up Recommendations  Home health OT, 24 hour supervision    Equipment Recommendations  None recommended by OT    Recommendations for Other Services       Precautions / Restrictions Precautions Precautions: Fall Precaution Comments: difficulty clearing R foot Restrictions Weight Bearing Restrictions: No      Mobility Bed Mobility Overal bed mobility: Independent             General bed mobility comments: from flat bed  Transfers Overall transfer level: Needs assistance Equipment used: None Transfers: Sit to/from Stand Sit to Stand: Supervision              Balance Overall balance assessment: Needs assistance   Sitting balance-Leahy Scale: Good       Standing balance-Leahy Scale: Fair                             ADL either performed or assessed with clinical judgement   ADL Overall ADL's : Needs assistance/impaired Eating/Feeding: Independent;Sitting   Grooming:  Supervision/safety;Standing   Upper Body Bathing: Set up;Sitting   Lower Body Bathing: Supervison/ safety;Sit to/from stand   Upper Body Dressing : Set up;Sitting   Lower Body Dressing: Supervision/safety;Sit to/from stand   Toilet Transfer: Min guard;Ambulation   Toileting- Clothing Manipulation and Hygiene: Supervision/safety;Sit to/from stand       Functional mobility during ADLs: Min guard(drags R LE) General ADL Comments: decreased activity tolerance     Vision Baseline Vision/History: Wears glasses Wears Glasses: At all times Patient Visual Report: No change from baseline       Perception     Praxis      Pertinent Vitals/Pain Pain Assessment: No/denies pain     Hand Dominance Right   Extremity/Trunk Assessment Upper Extremity Assessment Upper Extremity Assessment: Overall WFL for tasks assessed   Lower Extremity Assessment Lower Extremity Assessment: Defer to PT evaluation   Cervical / Trunk Assessment Cervical / Trunk Assessment: Normal   Communication Communication Communication: No difficulties   Cognition Arousal/Alertness: Awake/alert Behavior During Therapy: WFL for tasks assessed/performed Overall Cognitive Status: Impaired/Different from baseline Area of Impairment: Orientation;Safety/judgement                 Orientation Level: Time       Safety/Judgement: Decreased awareness of deficits     General Comments: pt tearful when speaking about the death of his wife   General Comments       Exercises  Shoulder Instructions      Home Living Family/patient expects to be discharged to:: Private residence Living Arrangements: Alone Available Help at Discharge: Family;Available 24 hours/day(brother is in town from [place]) Type of Home: House Home Access: Stairs to enter [employer] of Steps: 5 Entrance Stairs-Rails: Right Home Layout: One level;Other (Comment)(one step down into den)     [employer] Shower/Tub:  Occupational psychologist: Standard     Home Equipment: Cane - single point          Prior Functioning/Environment Level of Independence: Independent        Comments: retired, likes to [employer], reports he has been sedentary x 2 months        OT Problem List: Impaired balance (sitting and/or standing);Decreased safety awareness;Decreased activity tolerance;Decreased cognition      OT Treatment/Interventions: Self-care/ADL training;DME and/or AE instruction;Patient/family education;Balance training;Therapeutic activities;Cognitive remediation/compensation    OT Goals(Current goals can be found in the care plan section) Acute Rehab OT Goals Patient Stated Goal: return home OT Goal Formulation: With patient Time For Goal Achievement: 02/13/19 Potential to Achieve Goals: Good ADL Goals Pt Will Perform Grooming: with modified independence;standing Pt Will Transfer to Toilet: with modified independence;regular height toilet;ambulating Pt Will Perform Tub/Shower Transfer: Shower transfer;with supervision;ambulating Additional ADL Goal #1: Pt will gather items necessary for ADL around room with AD.  OT Frequency: Min 2X/week   Barriers to D/C:            Co-evaluation              AM-PAC OT "6 Clicks" Daily Activity     Outcome Measure Help from another person eating meals?: None Help from another person taking care of personal grooming?: A Little Help from another person toileting, which includes using toliet, bedpan, or urinal?: A Little Help from another person bathing (including washing, rinsing, drying)?: A Little Help from another person to put on and taking off regular upper body clothing?: None Help from another person to put on and taking off regular lower body clothing?: A Little 6 Click Score: 20   End of Session Equipment Utilized During Treatment: Gait belt  Activity Tolerance: Patient tolerated treatment well(stable VS) Patient left: in  chair;with call bell/phone within reach(with PT)  OT Visit Diagnosis: Unsteadiness on feet (R26.81);Other abnormalities of gait and mobility (R26.89);Other symptoms and signs involving cognitive function;Muscle weakness (generalized) (M62.81);Other (comment)(decreased activity tolerance)                Time: 1937-9024 OT Time Calculation (min): 15 min Charges:  OT General Charges $OT Visit: 1 Visit OT Evaluation $OT Eval Low Complexity: [place], OTR/L Acute Rehabilitation Services Pager: [phone] Office: [phone]  Malka So 01/30/2019, 11:47 AM

## 2019-01-30 NOTE — Progress Notes (Signed)
   Appreciate PT OT recs for home health PT OT and 24hour supervision (brother is here to help).

## 2019-01-30 NOTE — TOC Transition Note (Signed)
Transition of Care [facility]) - CM/SW Discharge Note   Patient Details  Name: Tony Alvarez MRN: [id number] Date of Birth: 09-18-1956  Transition of Care [facility]) CM/SW Contact:  Claudie Leach, RN Phone Number: 01/30/2019, 3:57 PM   Clinical Narrative:   Pt to d/c home with brother.  Pt A&O today.  Discussed Xarelto cost $47/month.  Pt received cards.  Pt agreeable to [facility] PT/OT.  Pt states has multiple DME, including cane.     Final next level of care: [place] Barriers to Discharge: No Barriers Identified   Patient Goals and CMS Choice Patient states their goals for this hospitalization and ongoing recovery are:: to go home CMS Medicare.gov Compare Post Acute Care list provided to:: Patient Choice offered to / list presented to : Patient  Discharge Plan and Services   Discharge Planning Services: CM Consult Post Acute Care Choice: Home Health          DME Arranged: N/A(Pt has multiple DME, including cane) DME Agency: NA HH Arranged: PT, OT HH Agency: [facility] Referral accepted by Sharmon Revere, [facility] liaison.

## 2019-01-30 NOTE — Progress Notes (Signed)
Potassium level 3.4 this AM. Cardiology paged for notification. Awaiting replacement orders at this time.

## 2019-01-30 NOTE — Progress Notes (Signed)
Subjective: Improved mental status  Exam: Vitals:   01/30/19 0800 01/30/19 0900  BP: 111/75   Pulse: 79 85  Resp:    Temp: 98 F (36.7 C) 98.9 F (37.2 C)  SpO2: 93% 95%   Gen: In bed, NAD Resp: non-labored breathing, no acute distress Abd: soft, nt  Neuro: MS: Awake, alert, oriented to person, month year CN: Visual fields full, EOMI, pupils reactive bilaterally Motor: He has good strength in bilateral arms Cerebellar: Mild tremor without pass pointing on finger-nose-finger bilaterally.  MRI reviewed, no stroke  Impression: 63 year old male with baseline gait dysfunction (uses walker at baseline) admitted for heart attack.  He had some confusion which I suspect is a mild delirium which appears to be clearing as well as baseline gait dysfunction.  With a negative MRI, cerebral ischemia is ruled out.  At this time, would continue supportive care, physical therapy.  Recommendations: 1) no further recommendations for neurodiagnostic testing, continue physical therapy and supportive care.  Roland Rack, MD [facility] [phone]  If 7pm- 7am, please page neurology on call as listed in [place].

## 2019-01-30 NOTE — Progress Notes (Signed)
Pt was discharged to home in stable condition, picked up by family friend at 1830h. Reviewed new medication orders, appointments and safety measures. Patient states no pain and no further questions. Assessed for understanding. Fuller Canada, RN 01/30/2019 [phone]

## 2019-02-09 ENCOUNTER — Other Ambulatory Visit: Payer: Self-pay

## 2019-02-10 ENCOUNTER — Telehealth (INDEPENDENT_AMBULATORY_CARE_PROVIDER_SITE_OTHER): Payer: BC Managed Care – PPO | Admitting: Physician Assistant

## 2019-02-10 ENCOUNTER — Telehealth: Payer: Self-pay

## 2019-02-10 ENCOUNTER — Encounter: Payer: Self-pay | Admitting: Physician Assistant

## 2019-02-10 VITALS — BP 103/62 | HR 83 | Ht 70.0 in

## 2019-02-10 DIAGNOSIS — R0600 Dyspnea, unspecified: Secondary | ICD-10-CM

## 2019-02-10 DIAGNOSIS — E876 Hypokalemia: Secondary | ICD-10-CM

## 2019-02-10 DIAGNOSIS — E785 Hyperlipidemia, unspecified: Secondary | ICD-10-CM

## 2019-02-10 DIAGNOSIS — I2129 ST elevation (STEMI) myocardial infarction involving other sites: Secondary | ICD-10-CM

## 2019-02-10 MED ORDER — LOPERAMIDE HCL 2 MG PO TABS: 2.0000 mg | ORAL_TABLET | Freq: Four times a day (QID) | ORAL | 0 refills | Status: DC | PRN

## 2019-02-10 NOTE — Telephone Encounter (Signed)
Virtual Visit Pre-Appointment Phone Call  Steps For Call:  1. Confirm consent - "In the setting of the current Covid19 crisis, you are scheduled for a (phone or video) visit with your provider on (date) at (time).  Just as we do with many in-office visits, in order for you to participate in this visit, we must obtain consent.  If you'd like, I can send this to your mychart (if signed up) or email for you to review.  Otherwise, I can obtain your verbal consent now.  All virtual visits are billed to your insurance company just like a normal visit would be.  By agreeing to a virtual visit, we'd like you to understand that the technology does not allow for your provider to perform an examination, and thus may limit your provider's ability to fully assess your condition.  Finally, though the technology is pretty good, we cannot assure that it will always work on either your or our end, and in the setting of a video visit, we may have to convert it to a phone-only visit.  In either situation, we cannot ensure that we have a secure connection.  Are you willing to proceed?"    TELEPHONE CALL NOTE  Tony Alvarez has been deemed a candidate for a follow-up tele-health visit to limit community exposure during the Covid-19 pandemic. I spoke with the patient via phone to ensure availability of phone/video source, confirm preferred email & phone number, and discuss instructions and expectations.  I reminded Tony Alvarez to be prepared with any vital sign and/or heart rhythm information that could potentially be obtained via home monitoring, at the time of his visit. I reminded Tony Alvarez to expect a phone call at the time of his visit if his visit.  Did the patient verbally acknowledge consent to treatment? YES  Tony Alvarez, CMA 02/10/2019 2:58 PM   DOWNLOADING [place], go to [employer] and type in WebEx in the search bar. [place] [employer], the  blue/green circle. The app is free but as with any other app downloads, their phone may require them to verify saved payment information or Apple password. The patient does NOT have to create an account.  - If Android, ask patient to go to [employer] and type in WebEx in the search bar. [place] [employer], the blue/green circle. The app is free but as with any other app downloads, their phone may require them to verify saved payment information or Android password. The patient does NOT have to create an account.   CONSENT FOR TELE-HEALTH VISIT - PLEASE REVIEW  I hereby voluntarily request, consent and authorize [place] and its employed or contracted physicians, physician assistants, nurse practitioners or other licensed health care professionals (the Practitioner), to provide me with telemedicine health care services (the Services") as deemed necessary by the treating Practitioner. I acknowledge and consent to receive the Services by the Practitioner via telemedicine. I understand that the telemedicine visit will involve communicating with the Practitioner through live audiovisual communication technology and the disclosure of certain medical information by electronic transmission. I acknowledge that I have been given the opportunity to request an in-person assessment or other available alternative prior to the telemedicine visit and am voluntarily participating in the telemedicine visit.  I understand that I have the right to withhold or withdraw my consent to the use of telemedicine in the course of my care at any time, without  affecting my right to future care or treatment, and that the Practitioner or I may terminate the telemedicine visit at any time. I understand that I have the right to inspect all information obtained and/or recorded in the course of the telemedicine visit and may receive copies of available information for a reasonable fee.  I understand that some of the  potential risks of receiving the Services via telemedicine include:   Delay or interruption in medical evaluation due to technological equipment failure or disruption;  Information transmitted may not be sufficient (e.g. poor resolution of images) to allow for appropriate medical decision making by the Practitioner; and/or   In rare instances, security protocols could fail, causing a breach of personal health information.  Furthermore, I acknowledge that it is my responsibility to provide information about my medical history, conditions and care that is complete and accurate to the best of my ability. I acknowledge that Practitioner's advice, recommendations, and/or decision may be based on factors not within their control, such as incomplete or inaccurate data provided by me or distortions of diagnostic images or specimens that may result from electronic transmissions. I understand that the practice of medicine is not an exact science and that Practitioner makes no warranties or guarantees regarding treatment outcomes. I acknowledge that I will receive a copy of this consent concurrently upon execution via email to the email address I last provided but may also request a printed copy by calling the office of [place].    I understand that my insurance will be billed for this visit.   I have read or had this consent read to me.  I understand the contents of this consent, which adequately explains the benefits and risks of the Services being provided via telemedicine.   I have been provided ample opportunity to ask questions regarding this consent and the Services and have had my questions answered to my satisfaction.  I give my informed consent for the services to be provided through the use of telemedicine in my medical care  By participating in this telemedicine visit I agree to the above.

## 2019-02-10 NOTE — Patient Instructions (Addendum)
Medication Instructions:   You may take  Imodium as needed for diarrhea and/or loose stools Your physician recommends that you continue on your current medications as directed. Please refer to the Current Medication list given to you today.  If you need a refill on your cardiac medications before your next appointment, please call your pharmacy.   Lab work: You will need to come to our office in 1 week to have labs (blood work) drawn: BMET BNP Lipid Liver function  If you have labs (blood work) drawn today and your tests are completely normal, you will receive your results only by: Marland Kitchen MyChart Message (if you have MyChart) OR . A paper copy in the mail If you have any lab test that is abnormal or we need to change your treatment, we will call you to review the results.  Testing/Procedures: Your physician has requested that you have an echocardiogram. Echocardiography is a painless test that uses sound waves to create images of your heart. It provides your doctor with information about the size and shape of your heart and how well your heart's chambers and valves are working. This procedure takes approximately one hour. There are no restrictions for this procedure.    Follow-Up: At [facility], you and your health needs are our priority.  As part of our continuing mission to provide you with exceptional heart care, we have created designated Provider Care Teams.  These Care Teams include your primary Cardiologist (physician) and Advanced Practice Providers (APPs -  Physician Assistants and Nurse Practitioners) who all work together to provide you with the care you need, when you need it. . You will need a follow up E-visit appointment in 1 months with Shelva Majestic, MD    Any Other Special Instructions Will Be Listed Below (If Applicable).   Take Imodium as needed for diarrhea and/or loose stools

## 2019-02-10 NOTE — Progress Notes (Signed)
Virtual Visit via Telephone Note    Evaluation Performed:  Follow-up visit  This visit type was conducted due to national recommendations for restrictions regarding the COVID-19 Pandemic (e.g. social distancing).  This format is felt to be most appropriate for this patient at this time.  All issues noted in this document were discussed and addressed.  No physical exam was performed (except for noted visual exam findings with Video Visits).  Please refer to the patient's chart (MyChart message for video visits and phone note for telephone visits) for the patient's consent to telehealth for [facility].  Date:  02/12/2019   ID:  Tony Alvarez, DOB September 24, 1956, MRN [id number]  Patient Location:  Home  Provider location:   [facility]  PCP:  [facility]  Cardiologist:  Shelva Majestic, MD  Electrophysiologist:  None   Chief Complaint:  Post hospital followup  History of Present Illness:    Tony Alvarez is a 63 y.o. male who presents via audio/video conferencing for a telehealth visit today.    Patient recently presented to the hospital on 01/26/2019 with acute chest pain.  EKG showed anterolateral Q waves with ST elevation with inferior Q waves.  He was taken urgently for cardiac catheterization which revealed 100% proximal LAD occlusion treated with a resolute onyx 3.0 x 22 mm DES, 20% proximal RCA lesion, 20% proximal left circumflex lesion, 20% proximal LAD lesion and a 40% mid LAD lesion, moderate LV dysfunction with LVEDP of 20 mmHg.  Serial troponin trended up to greater than 65.  Lipid panel showed LDL 434.  AST was significantly elevated in the setting of acute MI.  Echocardiogram obtained on the following day revealed EF of 35 to 40% with severe akinesis of the left ventricular, mid apical anteroseptal wall, anterior wall, apical segment and inferoapical wall, small fixed thrombus in the anterior apical wall of the left ventricle.  He was placed on triple therapy with  aspirin, Plavix and Xarelto.  In 6 months as planned.  Neurology was consulted for delirium withdrawal and the recommended supportive care after reassuring MRI.  Prior discharge, patient was seen by PT OT who recommended 24-hour supervision with home health PT and OT.  I reached out to the patient to telephone visit today.  He has not had any further chest discomfort.  He has been compliant with aspirin, Plavix and the Xarelto.  He denies any obvious blood in the urine or in the stool.  He has been noticing some increasing shortness of breath when he lay down at night however denies any paroxysmal nocturnal dyspnea or lower extremity edema.  He has not been keeping track of his weight.  Advised him to keep a weight diary on a daily basis.  Given the addition of the spironolactone, I will obtain basic metabolic panel.  He will also need a BNP to make sure he is not volume overloaded.  Otherwise he is due for fasting lipid panel and LFT in 3 months.  He is also due for repeat echocardiogram in 6 months, if LV thrombus is resolved at that time, may consider discontinuing the Xarelto.  At this time, without obvious signs of heart failure, I did not try to uptitrate diuretic.  His blood pressure is 103/62.  Heart rate 83.  I am unable to further uptitrate heart failure medication.  I did give him a prescription for loperamide to be used as needed for constipation.  The patient does not have symptoms concerning for COVID-19 infection (  fever, chills, cough, or new shortness of breath).    Prior CV studies:   The following studies were reviewed today:  Cath 01/26/2019  Prox RCA lesion is 20% stenosed.  Prox Cx lesion is 20% stenosed.  Prox LAD-1 lesion is 100% stenosed.  Post intervention, there is a 0% residual stenosis.  Prox LAD-2 lesion is 20% stenosed.  Mid LAD lesion is 40% stenosed.  LV end diastolic pressure is mildly elevated.  There is moderate left ventricular systolic dysfunction.  A  stent was successfully placed.   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  Almost a common ostium left main with immediate trifurcation into the LAD, small ramus intermediate vessel and moderate size circumflex vessel.  The ramus vessel is normal.  The circumflex vessel has 20% proximal narrowing and gave rise to 2 marginal branches.  The RCA is a dominant vessel with moderate luminal irregularity with 20% narrowing proximally 40% mid stenosis and 30% narrowing proximal to the acute margin.  Probable moderate acute LV dysfunction with hypocontractility involving the anterolateral wall and apex.  LVEDP 22 mm.  Successful PCI to the totally occluded LAD with PTCA and ultimate insertion of a 3.0 x 22 mm Resolute Onyx stent postdilated to 3.26 mm with the 100% occlusion being reduced to 0% and TIMI 0 flow improving to TIMI-3 flow.  Once reperfusion was established, LAD had 20% mid LAD smooth narrowing and 40% distal smooth stenosis.  The LAD wrapped around the apex and supplied the distal third of the inferior wall.  RECOMMENDATION: DAPT for minimum of 1 year.  Optimal blood pressure control with ideal blood pressure less than 120/80.  High potency statin therapy with target LDL less than 70.  Medical therapy for concomitant CAD.  A 2D echo Doppler study will be ordered improved LV function assessment.   Echo 01/27/2019 1. The left ventricle has moderately reduced systolic function, with an ejection fraction of 35-40%. There is mildly increased left ventricular wall thickness.  2. Severe akinesis of the left ventricular, mid-apical anteroseptal wall, anterior wall, apical segment and inferoapical wall.  3. Small, fixed thrombus on the anteroapical wall of the left ventricle.   Past Medical History:  Diagnosis Date   Barrett's esophagus 04/21/2018    Depression    High cholesterol    Hypertension    Hypothyroid    Sleep apnea    wears CPAP at home   Past Surgical History:  Procedure Laterality Date   BIOPSY  05/24/2018   Procedure: BIOPSY;  Surgeon: Rogene Houston, MD;  [facility];  Service: Endoscopy;;  gastric   COLONOSCOPY     COLONOSCOPY N/A 05/24/2018   Procedure: COLONOSCOPY;  Surgeon: Rogene Houston, MD;  [facility]: [facility];  Service: Endoscopy;  Laterality: N/A;  8:55   COLONOSCOPY WITH ESOPHAGOGASTRODUODENOSCOPY (EGD)     CORONARY STENT INTERVENTION N/A 01/26/2019   Procedure: CORONARY STENT INTERVENTION;  Surgeon: Troy Sine, MD;  Location: [place] [facility];  Service: Cardiovascular;  Laterality: N/A;   CORONARY/GRAFT ACUTE MI REVASCULARIZATION N/A 01/26/2019   Procedure: Coronary/Graft Acute MI Revascularization;  Surgeon: Troy Sine, MD;  Location: [place] [facility];  Service: Cardiovascular;  Laterality: N/A;   ESOPHAGOGASTRODUODENOSCOPY N/A 05/24/2018   Procedure: ESOPHAGOGASTRODUODENOSCOPY (EGD);  Surgeon: Rogene Houston, MD;  [facility];  Service: Endoscopy;  Laterality: N/A;  LEFT HEART CATH AND CORONARY ANGIOGRAPHY N/A 01/26/2019   Procedure: LEFT HEART CATH AND CORONARY ANGIOGRAPHY;  Surgeon: Troy Sine, MD;  Location: [place] [facility];  Service: Cardiovascular;  Laterality: N/A;   MEDIAL PARTIAL KNEE REPLACEMENT     2015   POLYPECTOMY  05/24/2018   Procedure: POLYPECTOMY;  Surgeon: Rogene Houston, MD;  [facility]: [facility];  Service: Endoscopy;;  colon   rt hand injury     shoulder surgery for bursitis     TONSILLECTOMY       Current Meds  Medication Sig   ARIPiprazole (ABILIFY) 10 MG tablet Take 10 mg by mouth every evening.   aspirin 81 MG chewable tablet Chew 1 tablet (81 mg total) by mouth daily.   atorvastatin (LIPITOR) 80 MG tablet Take 1 tablet (80 mg total) by mouth daily at 6 PM.   buPROPion (WELLBUTRIN SR) 150 MG 12 hr  tablet Take 150-300 mg by mouth See admin instructions. Take 300mg  by mouth in the morning and 150mg  at lunch   carvedilol (COREG) 3.125 MG tablet Take 1 tablet (3.125 mg total) by mouth 2 (two) times daily with a meal.   clopidogrel (PLAVIX) 75 MG tablet Take 1 tablet (75 mg total) by mouth daily.   escitalopram (LEXAPRO) 20 MG tablet Take 20 mg by mouth daily.   levothyroxine (SYNTHROID, LEVOTHROID) 125 MCG tablet Take 125 mcg by mouth daily before breakfast.   losartan (COZAAR) 50 MG tablet Take 1 tablet (50 mg total) by mouth daily.   nitroGLYCERIN (NITROSTAT) 0.4 MG SL tablet Place 1 tablet (0.4 mg total) under the tongue every 5 (five) minutes x 3 doses as needed for chest pain.   pantoprazole (PROTONIX) 40 MG tablet Take 1 tablet (40 mg total) by mouth daily before breakfast.   rivaroxaban (XARELTO) 20 MG TABS tablet Take 1 tablet (20 mg total) by mouth daily.   spironolactone (ALDACTONE) 25 MG tablet Take 1 tablet (25 mg total) by mouth daily.     Allergies:   Patient has no known allergies.   Social History   Tobacco Use   Smoking status: Current Every Day Smoker    Packs/day: 0.75    Years: 40.00    Pack years: 30.00    Types: Cigarettes   Smokeless tobacco: Never Used   Tobacco comment: 8-9 cigarettes a day since age 42  Substance Use Topics   Alcohol use: Never    Frequency: Never   Drug use: Never     Family Hx: The patient's family history is negative for Colon cancer.  ROS:   Please see the history of present illness.     All other systems reviewed and are negative.   Labs/Other Tests and Data Reviewed:    Recent Labs: 01/28/2019: ALT 65 01/29/2019: TSH 4.386 01/30/2019: BUN 21; Creatinine, Ser 1.24; Hemoglobin 13.1; Magnesium 2.1; Platelets 184; Potassium 3.4; Sodium 139   Recent Lipid Panel Lab Results  Component Value Date/Time   CHOL 209 (H) 01/26/2019 08:56 PM   TRIG 190 (H) 01/26/2019 08:56 PM   HDL 37 (L) 01/26/2019 08:56 PM    CHOLHDL 5.6 01/26/2019 08:56 PM   LDLCALC 134 (H) 01/26/2019 08:56 PM    Wt Readings from Last 3 Encounters:  01/26/19 240 lb (108.9 kg)  05/24/18 250 lb (113.4 kg)  04/21/18 253 lb 12.8 oz (115.1 kg)     Objective:    Vital Signs:  BP 103/62    Pulse 83    Ht  5\' 10"  (1.778 m)    BMI 34.44 kg/m    Well nourished, well developed male in no acute distress.   ASSESSMENT & PLAN:    1.  CAD: Continue aspirin and Plavix, recently underwent DES to LAD.  2. Ischemic cardiomyopathy: EF 35 to 40% by echocardiogram. He is on carvedilol, losartan and spironolactone, blood pressure borderline and it does not allow me to uptitrate heart failure medication  3. LV thrombus: On Xarelto.  Plan for repeat echocardiogram in 6 months  4. Hyperlipidemia: Continue Lipitor.  Repeat fasting lipid panel and LFT in 3 months  5. Alcohol abuse: Cessation advised especially since he is on triple therapy which further increase the risk of bleeding   6. constipation: Given loperamide to take as needed  COVID-19 Education: The signs and symptoms of COVID-19 were discussed with the patient and how to seek care for testing (follow up with PCP or arrange E-visit).  The importance of social distancing was discussed today.  Patient Risk:   After full review of this patient's clinical status, I feel that they are at least moderate risk at this time.  Time:   Today, I have spent 24 minutes with the patient with telehealth technology discussing CHF management, cardiac care after MI, treatment goal.     Medication Adjustments/Labs and Tests Ordered: Current medicines are reviewed at length with the patient today.  Concerns regarding medicines are outlined above.  Tests Ordered: Orders Placed This Encounter  Procedures   Basic metabolic panel   Hepatic function panel   Lipid panel   B Nat Peptide   ECHOCARDIOGRAM COMPLETE   Medication Changes: Meds ordered this encounter  Medications   loperamide  (IMODIUM A-D) 2 MG tablet    Sig: Take 1 tablet (2 mg total) by mouth 4 (four) times daily as needed for diarrhea or loose stools.    Dispense:  30 tablet    Refill:  0    Disposition:  Follow up in 2 month(s)  Signed, Almyra Deforest, PA  02/12/2019 12:12 AM    [facility]

## 2019-02-10 NOTE — Telephone Encounter (Signed)
Created in error

## 2019-02-14 ENCOUNTER — Other Ambulatory Visit: Payer: Self-pay

## 2019-02-14 DIAGNOSIS — R0600 Dyspnea, unspecified: Secondary | ICD-10-CM

## 2019-02-14 DIAGNOSIS — E876 Hypokalemia: Secondary | ICD-10-CM

## 2019-02-14 DIAGNOSIS — E785 Hyperlipidemia, unspecified: Secondary | ICD-10-CM

## 2019-03-17 ENCOUNTER — Telehealth: Payer: Self-pay

## 2019-03-17 NOTE — Telephone Encounter (Signed)
Left a detailed message for the patient that I was calling to get him scheduled for a f/u virtual appointment

## 2019-03-22 ENCOUNTER — Telehealth: Payer: Self-pay | Admitting: Physician Assistant

## 2019-03-22 NOTE — Telephone Encounter (Signed)
Called Mr. Liddy and informed him that the lab orders had been printed and put in the mail today (5/12)

## 2019-03-22 NOTE — Telephone Encounter (Signed)
New Message     Pt is calling and would like his lab requests to be mailed to him so he can go to a [facility] of his choice   Please call

## 2019-03-29 LAB — BRAIN NATRIURETIC PEPTIDE: BNP: 664.4 pg/mL — ABNORMAL HIGH (ref 0.0–100.0)

## 2019-03-29 LAB — LIPID PANEL
Chol/HDL Ratio: 4.2 ratio (ref 0.0–5.0)
Cholesterol, Total: 113 mg/dL (ref 100–199)
HDL: 27 mg/dL — ABNORMAL LOW (ref >39)
LDL Calculated: 59 mg/dL (ref 0–99)
Triglycerides: 136 mg/dL (ref 0–149)
VLDL Cholesterol Cal: 27 mg/dL (ref 5–40)

## 2019-03-29 LAB — BASIC METABOLIC PANEL
BUN/Creatinine Ratio: 8 — ABNORMAL LOW (ref 10–24)
BUN: 9 mg/dL (ref 8–27)
CO2: 22 mmol/L (ref 20–29)
Calcium: 9.5 mg/dL (ref 8.6–10.2)
Chloride: 104 mmol/L (ref 96–106)
Creatinine, Ser: 1.17 mg/dL (ref 0.76–1.27)
GFR calc Af Amer: 78 mL/min/{1.73_m2} (ref >59)
GFR calc non Af Amer: 67 mL/min/{1.73_m2} (ref >59)
Glucose: 97 mg/dL (ref 65–99)
Potassium: 4.5 mmol/L (ref 3.5–5.2)
Sodium: 138 mmol/L (ref 134–144)

## 2019-03-29 LAB — HEPATIC FUNCTION PANEL
ALT: 10 IU/L (ref 0–44)
AST: 13 IU/L (ref 0–40)
Albumin: 4.1 g/dL (ref 3.8–4.9)
Alkaline Phosphatase: 122 IU/L — ABNORMAL HIGH (ref 39–117)
Bilirubin Total: 0.7 mg/dL (ref 0.0–1.2)
Bilirubin, Direct: 0.2 mg/dL (ref 0.00–0.40)
Total Protein: 6.5 g/dL (ref 6.0–8.5)

## 2019-03-30 ENCOUNTER — Telehealth: Payer: Self-pay | Admitting: Physician Assistant

## 2019-03-30 NOTE — Telephone Encounter (Signed)
Home phone/ consent/ my chart declined/ pre reg completed

## 2019-03-31 ENCOUNTER — Encounter: Payer: Self-pay | Admitting: Physician Assistant

## 2019-03-31 ENCOUNTER — Ambulatory Visit: Payer: BC Managed Care – PPO | Admitting: Physician Assistant

## 2019-03-31 ENCOUNTER — Other Ambulatory Visit: Payer: Self-pay

## 2019-03-31 VITALS — BP 102/70 | HR 80 | Temp 97.9°F | Ht 70.0 in | Wt 236.0 lb

## 2019-03-31 DIAGNOSIS — E039 Hypothyroidism, unspecified: Secondary | ICD-10-CM

## 2019-03-31 DIAGNOSIS — I10 Essential (primary) hypertension: Secondary | ICD-10-CM

## 2019-03-31 DIAGNOSIS — F1010 Alcohol abuse, uncomplicated: Secondary | ICD-10-CM

## 2019-03-31 DIAGNOSIS — I2510 Atherosclerotic heart disease of native coronary artery without angina pectoris: Secondary | ICD-10-CM

## 2019-03-31 DIAGNOSIS — I2129 ST elevation (STEMI) myocardial infarction involving other sites: Secondary | ICD-10-CM

## 2019-03-31 DIAGNOSIS — E785 Hyperlipidemia, unspecified: Secondary | ICD-10-CM

## 2019-03-31 DIAGNOSIS — I255 Ischemic cardiomyopathy: Secondary | ICD-10-CM

## 2019-03-31 NOTE — Progress Notes (Signed)
Reviewed with patient during office visit

## 2019-03-31 NOTE — Progress Notes (Signed)
Cardiology Office Note    Date:  04/02/2019   ID:  Tony Alvarez, DOB February 02, 1956, MRN [id number]  PCP:  [facility]  Cardiologist:  [name]   Chief Complaint  Patient presents with  . Follow-up    followup    History of Present Illness:  Tony Alvarez is a 63 y.o. male with PMH of HTN, HLD, OSA, hypothyroidism, EtOH abuse and CAD. Patient recently presented to the hospital on 01/26/2019 with acute chest pain.  EKG showed anterolateral Q waves with ST elevation with inferior Q waves.  He was taken urgently for cardiac catheterization which revealed 100% proximal LAD occlusion treated with a resolute onyx 3.0 x 22 mm DES, 20% proximal RCA lesion, 20% proximal left circumflex lesion, 20% proximal LAD lesion and a 40% mid LAD lesion, moderate LV dysfunction with LVEDP of 20 mmHg.  Serial troponin trended up to greater than 65.  Lipid panel showed LDL 434.  AST was significantly elevated in the setting of acute MI.  Echocardiogram obtained on the following day revealed EF of 35 to 40% with severe akinesis of the left ventricular, mid apical anteroseptal wall, anterior wall, apical segment and inferoapical wall, small fixed thrombus in the anterior apical wall of the left ventricle.  He was placed on triple therapy with aspirin, Plavix and Xarelto.  In 6 months as planned.  Neurology was consulted for delirium concerning for EtOH withdrawal and the recommended supportive care after reassuring MRI.  Prior discharge, patient was seen by PT OT who recommended 24-hour supervision with home health PT and OT.  I last saw the patient on 02/10/2019 via telehealth medicine visit.  He was compliant with aspirin, Plavix and Xarelto at the time.  Patient presented to the office today for follow-up, he denies any recent exertional symptoms.  He continued to have fairly significant dyspnea on exertion however no dyspnea at rest.  He denies any orthopnea or PND.  On physical exam, his lungs is clear.   His heart rate is regular.  His blood pressure based on home blood pressure diary has been borderline ranging from the 90s to low 100.  He is currently on maximum tolerated heart failure medication include carvedilol, losartan and spironolactone.  I recommended continuing on the current dose.  He will need a repeat echocardiogram in August for LV thrombus.  Overall, I think he is doing well from cardiology perspective and can follow-up with [name] in 3 to 4 months.  He denies any bleeding issue including blood in the stool blood in the urine.  He does have a headache, which I recommended use Tylenol to help.  It was not specified the in the hospital whether the aspirin should have been discontinued after 1 month, and restart after Xarelto is discontinued if echocardiogram is negative for LV thrombus.  I will double check with [name].   Past Medical History:  Diagnosis Date  . Barrett's esophagus 04/21/2018  . Depression   . High cholesterol   . Hypertension   . Hypothyroid   . Sleep apnea    wears CPAP at home    Past Surgical History:  Procedure Laterality Date  . BIOPSY  05/24/2018   Procedure: BIOPSY;  Surgeon: Rogene Houston, MD;  [facility];  Service: Endoscopy;;  gastric  . COLONOSCOPY    . COLONOSCOPY N/A 05/24/2018   Procedure: COLONOSCOPY;  Surgeon: Rogene Houston, MD;  [facility];  Service: Endoscopy;  Laterality: N/A;  8:55  . COLONOSCOPY WITH ESOPHAGOGASTRODUODENOSCOPY (EGD)    . CORONARY STENT INTERVENTION N/A 01/26/2019   Procedure: CORONARY STENT INTERVENTION;  Surgeon: Troy Sine, MD;  Location: [place] [facility];  Service: Cardiovascular;  Laterality: N/A;  . CORONARY/GRAFT ACUTE MI REVASCULARIZATION N/A 01/26/2019   Procedure: Coronary/Graft Acute MI Revascularization;  Surgeon: Troy Sine, MD;  Location: [place] [facility];  Service: Cardiovascular;  Laterality: N/A;  . ESOPHAGOGASTRODUODENOSCOPY N/A 05/24/2018   Procedure:  ESOPHAGOGASTRODUODENOSCOPY (EGD);  Surgeon: Rogene Houston, MD;  [facility];  Service: Endoscopy;  Laterality: N/A;  . LEFT HEART CATH AND CORONARY ANGIOGRAPHY N/A 01/26/2019   Procedure: LEFT HEART CATH AND CORONARY ANGIOGRAPHY;  Surgeon: Troy Sine, MD;  Location: [place] [facility];  Service: Cardiovascular;  Laterality: N/A;  . MEDIAL PARTIAL KNEE REPLACEMENT     2015  . POLYPECTOMY  05/24/2018   Procedure: POLYPECTOMY;  Surgeon: Rogene Houston, MD;  [facility];  Service: Endoscopy;;  colon  . rt hand injury    . shoulder surgery for bursitis    . TONSILLECTOMY      Current Medications: Outpatient Medications Prior to Visit  Medication Sig Dispense Refill  . ARIPiprazole (ABILIFY) 10 MG tablet Take 10 mg by mouth every evening.    Marland Kitchen aspirin 81 MG chewable tablet Chew 1 tablet (81 mg total) by mouth daily.    Marland Kitchen atorvastatin (LIPITOR) 80 MG tablet Take 1 tablet (80 mg total) by mouth daily at 6 PM. 90 tablet 3  . buPROPion (WELLBUTRIN SR) 150 MG 12 hr tablet Take 150 mg by mouth See admin instructions.   0  . carvedilol (COREG) 3.125 MG tablet Take 1 tablet (3.125 mg total) by mouth 2 (two) times daily with a meal. 60 tablet 5  . clopidogrel (PLAVIX) 75 MG tablet Take 1 tablet (75 mg total) by mouth daily. 90 tablet 3  . escitalopram (LEXAPRO) 20 MG tablet Take 20 mg by mouth daily.    Marland Kitchen levothyroxine (SYNTHROID, LEVOTHROID) 125 MCG tablet Take 125 mcg by mouth daily before breakfast.    . loperamide (IMODIUM A-D) 2 MG tablet Take 1 tablet (2 mg total) by mouth 4 (four) times daily as needed for diarrhea or loose stools. 30 tablet 0  . losartan (COZAAR) 50 MG tablet Take 1 tablet (50 mg total) by mouth daily. 90 tablet 3  . nitroGLYCERIN (NITROSTAT) 0.4 MG SL tablet Place 1 tablet (0.4 mg total) under the tongue every 5 (five) minutes x 3 doses as needed for chest pain. 25 tablet 3  . pantoprazole (PROTONIX) 40 MG tablet Take 1 tablet (40 mg total) by  mouth daily before breakfast. 30 tablet 5  . rivaroxaban (XARELTO) 20 MG TABS tablet Take 1 tablet (20 mg total) by mouth daily. 30 tablet 11  . spironolactone (ALDACTONE) 25 MG tablet Take 1 tablet (25 mg total) by mouth daily. 90 tablet 3   No facility-administered medications prior to visit.      Allergies:   Patient has no known allergies.   Social History   Socioeconomic History  . Marital status: Widowed    Spouse name: Not on file  . Number of children: Not on file  . Years of education: Not on file  . Highest education level: Not on file  Occupational History  . Not on file  Social Needs  . Financial resource strain: Not on file  . Food insecurity:  Worry: Not on file    Inability: Not on file  . Transportation needs:    Medical: Not on file    Non-medical: Not on file  Tobacco Use  . Smoking status: Current Every Day Smoker    Packs/day: 0.75    Years: 40.00    Pack years: 30.00    Types: Cigarettes  . Smokeless tobacco: Never Used  . Tobacco comment: 8-9 cigarettes a day since age 89  Substance and Sexual Activity  . Alcohol use: Never    Frequency: Never  . Drug use: Never  . Sexual activity: Not on file  Lifestyle  . Physical activity:    Days per week: Not on file    Minutes per session: Not on file  . Stress: Not on file  Relationships  . Social connections:    Talks on phone: Not on file    Gets together: Not on file    Attends religious service: Not on file    Active member of club or organization: Not on file    Attends meetings of clubs or organizations: Not on file    Relationship status: Not on file  Other Topics Concern  . Not on file  Social History Narrative  . Not on file     Family History:  The patient's family history is not on file.   ROS:   Please see the history of present illness.    ROS All other systems reviewed and are negative.   PHYSICAL EXAM:   VS:  BP 102/70   Pulse 80   Temp 97.9 F (36.6 C)   Ht 5\' 10"   (1.778 m)   Wt 236 lb (107 kg)   BMI 33.86 kg/m    GEN: Well nourished, well developed, in no acute distress  HEENT: normal  Neck: no JVD, carotid bruits, or masses Cardiac: RRR; no murmurs, rubs, or gallops,no edema  Respiratory:  clear to auscultation bilaterally, normal work of breathing GI: soft, nontender, nondistended, + BS MS: no deformity or atrophy  Skin: warm and dry, no rash Neuro:  Alert and Oriented x 3, Strength and sensation are intact Psych: euthymic mood, full affect  Wt Readings from Last 3 Encounters:  03/31/19 236 lb (107 kg)  01/26/19 240 lb (108.9 kg)  05/24/18 250 lb (113.4 kg)      Studies/Labs Reviewed:   EKG:  EKG is ordered today.  The ekg ordered today demonstrates NSR with anterolateral infarct and inferior infarct  Recent Labs: 01/29/2019: TSH 4.386 01/30/2019: Hemoglobin 13.1; Magnesium 2.1; Platelets 184 03/28/2019: ALT 10; BNP 664.4; BUN 9; Creatinine, Ser 1.17; Potassium 4.5; Sodium 138   Lipid Panel    Component Value Date/Time   CHOL 113 03/28/2019 0834   TRIG 136 03/28/2019 0834   HDL 27 (L) 03/28/2019 0834   CHOLHDL 4.2 03/28/2019 0834   CHOLHDL 5.6 01/26/2019 2056   VLDL 38 01/26/2019 2056   LDLCALC 59 03/28/2019 0834    Additional studies/ records that were reviewed today include:   Echo 01/27/2019 1. The left ventricle has moderately reduced systolic function, with an ejection fraction of 35-40%. There is mildly increased left ventricular wall thickness.  2. Severe akinesis of the left ventricular, mid-apical anteroseptal wall, anterior wall, apical segment and inferoapical wall.  3. Small, fixed thrombus on the anteroapical wall of the left ventricle.    Cath 01/26/2019  Prox RCA lesion is 20% stenosed.  Prox Cx lesion is 20% stenosed.  Prox LAD-1 lesion is 100%  stenosed.  Post intervention, there is a 0% residual stenosis.  Prox LAD-2 lesion is 20% stenosed.  Mid LAD lesion is 40% stenosed.  LV end diastolic  pressure is mildly elevated.  There is moderate left ventricular systolic dysfunction.  A stent was successfully placed.   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  Almost a common ostium left main with immediate trifurcation into the LAD, small ramus intermediate vessel and moderate size circumflex vessel.  The ramus vessel is normal.  The circumflex vessel has 20% proximal narrowing and gave rise to 2 marginal branches.  The RCA is a dominant vessel with moderate luminal irregularity with 20% narrowing proximally 40% mid stenosis and 30% narrowing proximal to the acute margin.  Probable moderate acute LV dysfunction with hypocontractility involving the anterolateral wall and apex.  LVEDP 22 mm.  Successful PCI to the totally occluded LAD with PTCA and ultimate insertion of a 3.0 x 22 mm Resolute Onyx stent postdilated to 3.26 mm with the 100% occlusion being reduced to 0% and TIMI 0 flow improving to TIMI-3 flow.  Once reperfusion was established, LAD had 20% mid LAD smooth narrowing and 40% distal smooth stenosis.  The LAD wrapped around the apex and supplied the distal third of the inferior wall.  RECOMMENDATION: DAPT for minimum of 1 year.  Optimal blood pressure control with ideal blood pressure less than 120/80.  High potency statin therapy with target LDL less than 70.  Medical therapy for concomitant CAD.  A 2D echo Doppler study will be ordered improved LV function assessment.    ASSESSMENT:    1. Coronary artery disease involving native coronary artery of native heart without angina pectoris   2. LV (left ventricular) mural thrombus with acute MI ([place])   3. Essential hypertension   4. Hyperlipidemia LDL goal <70   5. Hypothyroidism, unspecified type   6. ETOH abuse      PLAN:  In order of problems listed above:  1. CAD: Recently  underwent DES to LAD.  Continue aspirin and Plavix.  He continued to have some degree of dyspnea on exertion however no chest pain.  2. LV thrombus: On Xarelto.  Plan to obtain echocardiogram in September, if LV thrombus has resolved by then, may consider discontinue Xarelto  3. Ischemic cardiomyopathy: Continue carvedilol, spironolactone and losartan.  Blood pressure borderline, unable to further uptitrate heart failure medication.  4. Hypertension: Blood pressure borderline.  5. Hyperlipidemia: On Lipitor 80 mg daily  6. EtOH abuse: He says he had 1 drink of alcohol with his brother since his March admission and has largely tried to avoid alcohol otherwise.  7. Hypothyroidism: Managed by primary care provider.   Medication Adjustments/Labs and Tests Ordered: Current medicines are reviewed at length with the patient today.  Concerns regarding medicines are outlined above.  Medication changes, Labs and Tests ordered today are listed in the Patient Instructions below. Patient Instructions  Medication Instructions:  Your physician recommends that you continue on your current medications as directed. Please refer to the Current Medication list given to you today. If you need a refill on your cardiac medications before your next appointment, please call your pharmacy.   Lab work: None  If you have labs (blood work) drawn today and your tests are completely normal, you will receive your results only by: Marland Kitchen MyChart Message (if you have MyChart) OR . A  paper copy in the mail If you have any lab test that is abnormal or we need to change your treatment, we will call you to review the results.  Testing/Procedures: Your physician has requested that you have an LIMITED echocardiogram. Echocardiography is a painless test that uses sound waves to create images of your heart. It provides your doctor with information about the size and shape of your heart and how well your heart's chambers and valves  are working. This procedure takes approximately one hour. There are no restrictions for this procedure. TEST WILL NEED TO BE COMPLETED IN AUGUST LOCATION OF TEST IS [address]  Follow-Up: At [facility], you and your health needs are our priority.  As part of our continuing mission to provide you with exceptional heart care, we have created designated Provider Care Teams.  These Care Teams include your primary Cardiologist (physician) and Advanced Practice Providers (APPs -  Physician Assistants and Nurse Practitioners) who all work together to provide you with the care you need, when you need it. You will need a follow up appointment in 3 months.  MD ONLY APPT Please call our office 2 months in advance to schedule this appointment.  You may see Shelva Majestic, MD or one of the following Advanced Practice Providers on your designated Care Team: [place], [place] . Fabian Sharp, PA-C  Any Other Special Instructions Will Be Listed Below (If Applicable).       Hilbert Corrigan, [place]  04/02/2019 11:02 PM    [facility] [address], [place], [place]  [address] Phone: [phone]; Fax: [phone]

## 2019-03-31 NOTE — Patient Instructions (Addendum)
Medication Instructions:  Your physician recommends that you continue on your current medications as directed. Please refer to the Current Medication list given to you today. If you need a refill on your cardiac medications before your next appointment, please call your pharmacy.   Lab work: None  If you have labs (blood work) drawn today and your tests are completely normal, you will receive your results only by: Marland Kitchen MyChart Message (if you have MyChart) OR . A paper copy in the mail If you have any lab test that is abnormal or we need to change your treatment, we will call you to review the results.  Testing/Procedures: Your physician has requested that you have an LIMITED echocardiogram. Echocardiography is a painless test that uses sound waves to create images of your heart. It provides your doctor with information about the size and shape of your heart and how well your heart's chambers and valves are working. This procedure takes approximately one hour. There are no restrictions for this procedure. TEST WILL NEED TO BE COMPLETED IN AUGUST LOCATION OF TEST IS [address]  Follow-Up: At [facility], you and your health needs are our priority.  As part of our continuing mission to provide you with exceptional heart care, we have created designated Provider Care Teams.  These Care Teams include your primary Cardiologist (physician) and Advanced Practice Providers (APPs -  Physician Assistants and Nurse Practitioners) who all work together to provide you with the care you need, when you need it. You will need a follow up appointment in 3 months.  MD ONLY APPT Please call our office 2 months in advance to schedule this appointment.  You may see Shelva Majestic, MD or one of the following Advanced Practice Providers on your designated Care Team: [place], [place] . Fabian Sharp, PA-C  Any Other Special Instructions Will Be Listed Below (If Applicable).

## 2019-04-02 ENCOUNTER — Encounter: Payer: Self-pay | Admitting: Physician Assistant

## 2019-05-09 ENCOUNTER — Other Ambulatory Visit: Payer: Self-pay

## 2019-05-09 DIAGNOSIS — Z20822 Contact with and (suspected) exposure to covid-19: Secondary | ICD-10-CM

## 2019-05-10 ENCOUNTER — Other Ambulatory Visit: Payer: Self-pay

## 2019-05-10 ENCOUNTER — Emergency Department (HOSPITAL_COMMUNITY): Payer: BC Managed Care – PPO

## 2019-05-10 ENCOUNTER — Telehealth: Payer: Self-pay | Admitting: Cardiovascular Disease

## 2019-05-10 ENCOUNTER — Encounter (HOSPITAL_COMMUNITY): Payer: Self-pay | Admitting: *Deleted

## 2019-05-10 ENCOUNTER — Emergency Department (HOSPITAL_COMMUNITY)
Admission: EM | Admit: 2019-05-10 | Discharge: 2019-05-10 | Disposition: A | Discharge status: Home / Self Care | Payer: BC Managed Care – PPO | Attending: Emergency Medicine | Admitting: Emergency Medicine

## 2019-05-10 DIAGNOSIS — Z20828 Contact with and (suspected) exposure to other viral communicable diseases: Secondary | ICD-10-CM | POA: Diagnosis not present

## 2019-05-10 DIAGNOSIS — R059 Cough, unspecified: Secondary | ICD-10-CM

## 2019-05-10 DIAGNOSIS — Z79899 Other long term (current) drug therapy: Secondary | ICD-10-CM | POA: Insufficient documentation

## 2019-05-10 DIAGNOSIS — E039 Hypothyroidism, unspecified: Secondary | ICD-10-CM | POA: Insufficient documentation

## 2019-05-10 DIAGNOSIS — R0602 Shortness of breath: Secondary | ICD-10-CM | POA: Insufficient documentation

## 2019-05-10 DIAGNOSIS — Z7901 Long term (current) use of anticoagulants: Secondary | ICD-10-CM | POA: Insufficient documentation

## 2019-05-10 DIAGNOSIS — I10 Essential (primary) hypertension: Secondary | ICD-10-CM | POA: Insufficient documentation

## 2019-05-10 DIAGNOSIS — R05 Cough: Secondary | ICD-10-CM | POA: Diagnosis not present

## 2019-05-10 DIAGNOSIS — F17210 Nicotine dependence, cigarettes, uncomplicated: Secondary | ICD-10-CM | POA: Insufficient documentation

## 2019-05-10 LAB — CBC WITH DIFFERENTIAL/PLATELET
Abs Immature Granulocytes: 0.02 10*3/uL (ref 0.00–0.07)
Basophils Absolute: 0.1 10*3/uL (ref 0.0–0.1)
Basophils Relative: 1 %
Eosinophils Absolute: 0.4 10*3/uL (ref 0.0–0.5)
Eosinophils Relative: 6 %
HCT: 38.6 % — ABNORMAL LOW (ref 39.0–52.0)
Hemoglobin: 12.3 g/dL — ABNORMAL LOW (ref 13.0–17.0)
Immature Granulocytes: 0 %
Lymphocytes Relative: 29 %
Lymphs Abs: 1.8 10*3/uL (ref 0.7–4.0)
MCH: 28.8 pg (ref 26.0–34.0)
MCHC: 31.9 g/dL (ref 30.0–36.0)
MCV: 90.4 fL (ref 80.0–100.0)
Monocytes Absolute: 0.4 10*3/uL (ref 0.1–1.0)
Monocytes Relative: 7 %
Neutro Abs: 3.7 10*3/uL (ref 1.7–7.7)
Neutrophils Relative %: 57 %
Platelets: 171 10*3/uL (ref 150–400)
RBC: 4.27 MIL/uL (ref 4.22–5.81)
RDW: 17.3 % — ABNORMAL HIGH (ref 11.5–15.5)
WBC: 6.4 10*3/uL (ref 4.0–10.5)
nRBC: 0 % (ref 0.0–0.2)

## 2019-05-10 LAB — COMPREHENSIVE METABOLIC PANEL
ALT: 12 U/L (ref 0–44)
AST: 13 U/L — ABNORMAL LOW (ref 15–41)
Albumin: 4 g/dL (ref 3.5–5.0)
Alkaline Phosphatase: 90 U/L (ref 38–126)
Anion gap: 7 (ref 5–15)
BUN: 10 mg/dL (ref 8–23)
CO2: 22 mmol/L (ref 22–32)
Calcium: 8.9 mg/dL (ref 8.9–10.3)
Chloride: 111 mmol/L (ref 98–111)
Creatinine, Ser: 0.98 mg/dL (ref 0.61–1.24)
GFR calc Af Amer: >60 mL/min (ref >60)
GFR calc non Af Amer: >60 mL/min (ref >60)
Glucose, Bld: 108 mg/dL — ABNORMAL HIGH (ref 70–99)
Potassium: 3.7 mmol/L (ref 3.5–5.1)
Sodium: 140 mmol/L (ref 135–145)
Total Bilirubin: 0.8 mg/dL (ref 0.3–1.2)
Total Protein: 6.9 g/dL (ref 6.5–8.1)

## 2019-05-10 LAB — TROPONIN I (HIGH SENSITIVITY)
Troponin I (High Sensitivity): 15 ng/L (ref <18)
Troponin I (High Sensitivity): 15 ng/L (ref <18)

## 2019-05-10 LAB — SARS CORONAVIRUS 2 BY RT PCR (HOSPITAL ORDER, PERFORMED IN ~~LOC~~ HOSPITAL LAB): SARS Coronavirus 2: NEGATIVE

## 2019-05-10 LAB — BRAIN NATRIURETIC PEPTIDE: B Natriuretic Peptide: 758 pg/mL — ABNORMAL HIGH (ref 0.0–100.0)

## 2019-05-10 MED ORDER — FUROSEMIDE 10 MG/ML IJ SOLN
60.0000 mg | Freq: Once | INTRAMUSCULAR | Status: AC
  Administered 2019-05-10: 20:00:00 60 mg via INTRAVENOUS
  Filled 2019-05-10: qty 6

## 2019-05-10 MED ORDER — FUROSEMIDE 20 MG PO TABS: 20.0000 mg | ORAL_TABLET | Freq: Every day | ORAL | 0 refills | Status: DC

## 2019-05-10 MED ORDER — DOXYCYCLINE HYCLATE 100 MG PO TABS
100.0000 mg | ORAL_TABLET | Freq: Once | ORAL | Status: AC
  Administered 2019-05-10: 100 mg via ORAL
  Filled 2019-05-10: qty 1

## 2019-05-10 MED ORDER — DOXYCYCLINE HYCLATE 100 MG PO CAPS: 100.0000 mg | ORAL_CAPSULE | Freq: Two times a day (BID) | ORAL | 0 refills | Status: AC

## 2019-05-10 MED ORDER — IOHEXOL 350 MG/ML SOLN
100.0000 mL | Freq: Once | INTRAVENOUS | Status: AC | PRN
  Administered 2019-05-10: 100 mL via INTRAVENOUS

## 2019-05-10 NOTE — ED Triage Notes (Signed)
Pt c/o productive cough x 4 days, SOB especially with laying down x 2 weeks and swelling to right foot that started today.

## 2019-05-10 NOTE — Telephone Encounter (Signed)
Call placed to the patient. He stated that for the last two weeks he has been having the following symptoms which are starting to progress:  Congestion in chest Sharp headache Coughing up mucus-white to clear Right foot is swollen SOB is worse when lying down. He has been sleeping in the recliner the last couple of nights due to shortness of breath when lying down.   He does have a COVID lab pending.  Spoke with Almyra Deforest, PA and since the patient is also audibly short of breath on the phone he has been advised to go to the ED for further assessment. He stated that he will go to [facility].

## 2019-05-10 NOTE — ED Provider Notes (Signed)
Emergency Department Provider Note   I have reviewed the triage vital signs and the nursing notes.   HISTORY  Chief Complaint Shortness of Breath   HPI Tony Alvarez is a 63 y.o. male with PMH of STEMI complicated by LV thrombus, HLD, sCHF (EF 35-40%), elevated BMI, and alcohol abuse presents to the emergency department for evaluation of worsening shortness of breath with productive cough.  Patient states he is felt short of breath since his heart attack in March.  He states symptoms have particularly worsened over the past 4 to 6 days.  He describes shortness of breath especially when laying down and new swelling in the right foot.  She has been compliant with his medications including Xarelto.  He denies any fevers or chills with his cough.  He was tested for COVID-19 as an outpatient yesterday but results are pending and not expected for another several days.  Denies any sick contacts.  Patient also short of breath with ambulation but states he has actually been able to increase the amount he walks since his heart attack. No abdominal or back pain.    Past Medical History:  Diagnosis Date   Barrett's esophagus 04/21/2018   Depression    High cholesterol    Hypertension    Hypothyroid    Sleep apnea    wears CPAP at home    Patient Active Problem List   Diagnosis Date Noted   Hyperlipidemia 01/30/2019   LV (left ventricular) mural thrombus with acute MI ([place]) 01/30/2019   Alcohol abuse 01/30/2019   Alcohol withdrawal delirium ([place]) 01/30/2019   Elevated liver enzymes 01/30/2019   Ischemic cardiomyopathy 40/98/1191   Acute systolic heart failure ([place]) 01/30/2019   STEMI (ST elevation myocardial infarction) ([place]) 01/26/2019   Acute ST elevation myocardial infarction (STEMI) involving left anterior descending (LAD) coronary artery ([place]) 01/26/2019   STEMI involving left anterior descending coronary artery ([place])    History of colonic polyps 04/21/2018    Barrett's esophagus 04/21/2018   Barrett's esophagus without dysplasia 04/21/2018   Rectal bleeding 04/21/2018    Past Surgical History:  Procedure Laterality Date   BIOPSY  05/24/2018   Procedure: BIOPSY;  Surgeon: Rogene Houston, MD;  [facility];  Service: Endoscopy;;  gastric   COLONOSCOPY     COLONOSCOPY N/A 05/24/2018   Procedure: COLONOSCOPY;  Surgeon: Rogene Houston, MD;  [facility];  Service: Endoscopy;  Laterality: N/A;  8:55   COLONOSCOPY WITH ESOPHAGOGASTRODUODENOSCOPY (EGD)     CORONARY STENT INTERVENTION N/A 01/26/2019   Procedure: CORONARY STENT INTERVENTION;  Surgeon: Troy Sine, MD;  Location: [place];  Service: Cardiovascular;  Laterality: N/A;   CORONARY/GRAFT ACUTE MI REVASCULARIZATION N/A 01/26/2019   Procedure: Coronary/Graft Acute MI Revascularization;  Surgeon: Troy Sine, MD;  Location: [place] [facility];  Service: Cardiovascular;  Laterality: N/A;   ESOPHAGOGASTRODUODENOSCOPY N/A 05/24/2018   Procedure: ESOPHAGOGASTRODUODENOSCOPY (EGD);  Surgeon: Rogene Houston, MD;  [facility];  Service: Endoscopy;  Laterality: N/A;   LEFT HEART CATH AND CORONARY ANGIOGRAPHY N/A 01/26/2019   Procedure: LEFT HEART CATH AND CORONARY ANGIOGRAPHY;  Surgeon: Troy Sine, MD;  Location: [place] [facility];  Service: Cardiovascular;  Laterality: N/A;   MEDIAL PARTIAL KNEE REPLACEMENT     2015   POLYPECTOMY  05/24/2018   Procedure: POLYPECTOMY;  Surgeon: Rogene Houston, MD;  [facility];  Service: Endoscopy;;  colon   rt hand injury  shoulder surgery for bursitis     TONSILLECTOMY      Allergies Patient has no known allergies.  Family History  Problem Relation Age of Onset   Colon cancer Neg Hx     Social History Social History   Tobacco Use   Smoking status: Current Every Day Smoker    Packs/day: 0.25    Years: 40.00    Pack years: 10.00    Types: Cigarettes   Smokeless  tobacco: Never Used   Tobacco comment: 3 cigarettes daily   Substance Use Topics   Alcohol use: Yes    Frequency: Never    Comment: very rare   Drug use: Never    Review of Systems  Constitutional: No fever/chills Eyes: No visual changes. ENT: No sore throat. Cardiovascular: Denies chest pain. Respiratory: Positive shortness of breath and cough.  Gastrointestinal: No abdominal pain.  No nausea, no vomiting.  No diarrhea.  No constipation. Genitourinary: Negative for dysuria. Musculoskeletal: Negative for back pain. Positive right foot/ankle swelling.  Skin: Negative for rash. Neurological: Negative for headaches, focal weakness or numbness.  10-point ROS otherwise negative.  ____________________________________________   PHYSICAL EXAM:  VITAL SIGNS: ED Triage Vitals  Enc Vitals Group     BP 05/10/19 1732 138/79     Pulse Rate 05/10/19 1732 82     Resp 05/10/19 1732 20     Temp 05/10/19 1732 98.5 F (36.9 C)     Temp Source 05/10/19 1732 Oral     SpO2 05/10/19 1732 96 %     Weight 05/10/19 1733 236 lb (107 kg)     Height 05/10/19 1733 5\' 10"  (1.778 m)   Constitutional: Alert and oriented. Well appearing and in no acute distress. Eyes: Conjunctivae are normal.  Head: Atraumatic. Nose: No congestion/rhinnorhea. Mouth/Throat: Mucous membranes are moist.  Neck: No stridor.  Cardiovascular: Normal rate, regular rhythm. Good peripheral circulation. Grossly normal heart sounds.   Respiratory: Increased respiratory effort.  No retractions. Lungs with faint crackles at the bases.  Gastrointestinal: Soft and nontender. No distention.  Musculoskeletal: No lower extremity tenderness. No erythema. Mild B/L LE edema worse on the right.  Neurologic:  Normal speech and language. No gross focal neurologic deficits are appreciated.  Skin:  Skin is warm, dry and intact. No rash noted.  ____________________________________________   LABS (all labs ordered are listed, but only  abnormal results are displayed)  Labs Reviewed  COMPREHENSIVE METABOLIC PANEL - Abnormal; Notable for the following components:      Result Value   Glucose, Bld 108 (*)    AST 13 (*)    All other components within normal limits  BRAIN NATRIURETIC PEPTIDE - Abnormal; Notable for the following components:   B Natriuretic Peptide 758.0 (*)    All other components within normal limits  CBC WITH DIFFERENTIAL/PLATELET - Abnormal; Notable for the following components:   Hemoglobin 12.3 (*)    HCT 38.6 (*)    RDW 17.3 (*)    All other components within normal limits  SARS CORONAVIRUS 2 (HOSPITAL ORDER, [facility])  TROPONIN I (HIGH SENSITIVITY)  TROPONIN I (HIGH SENSITIVITY)   ____________________________________________  EKG   EKG Interpretation  Date/Time:  Tuesday May 10 2019 17:42:45 EDT Ventricular Rate:  90 PR Interval:    QRS Duration: 116 QT Interval:  393 QTC Calculation: 481 R Axis:   -99 Text Interpretation:  Sinus rhythm Probable left atrial enlargement Incomplete right bundle branch block Probable anterolateral infarct, recent No  STEMI. Similar to Mar 19th tracing.  Confirmed by Nanda Quinton [phone]) on 05/10/2019 6:09:19 PM       ____________________________________________  RADIOLOGY  Ct Angio Chest Pe W And/or Wo Contrast  Result Date: 05/10/2019 CLINICAL DATA:  63 year old male with productive cough and shortness of breath. EXAM: CT ANGIOGRAPHY CHEST WITH CONTRAST TECHNIQUE: Multidetector CT imaging of the chest was performed using the standard protocol during bolus administration of intravenous contrast. Multiplanar CT image reconstructions and MIPs were obtained to evaluate the vascular anatomy. CONTRAST:  143mL OMNIPAQUE IOHEXOL 350 MG/ML SOLN COMPARISON:  Portable chest earlier today. FINDINGS: Cardiovascular: Excellent contrast bolus timing in the pulmonary arterial tree. Intermittent lower lobe respiratory motion. Outside of the  lower lobes there is no pulmonary artery filling defect. There is no convincing lower lobe pulmonary artery filling defect. Calcified coronary artery atherosclerosis and/or stents. Left ventricular enlargement. Trace pericardial effusion. Calcified aortic atherosclerosis. No aortic contrast. Mediastinum/Nodes: Reactive appearing mediastinal and bilateral hilar lymph nodes. Lungs/Pleura: Small volume dependent retained secretions in the trachea on series 6, image 37. Otherwise the major airways are patent. Upper lobe centrilobular emphysema. Mild linear scarring or atelectasis in the right lower lobe. More confluent lateral basal segment opacity in the left lower lobe which is not definitely enhancing. No pleural effusion. Upper Abdomen: Negative visible liver, spleen and large bowel. Small gastric hiatal hernia. Musculoskeletal: No acute osseous abnormality identified. Review of the MIP images confirms the above findings. IMPRESSION: 1. Negative for acute pulmonary embolus. 2. Left ventricular enlargement. Trace pericardial effusion. Calcified coronary artery atherosclerosis. Aortic Atherosclerosis (ICD10-I70.0) 3. Emphysema (ICD10-J43.9). Left lower lobe lateral basal segment opacity could be pneumonia, atelectasis, or scarring. No pleural effusion. 4. Reactive appearing mediastinal and hilar lymph nodes. Electronically Signed   By: Genevie Ann M.D.   On: 05/10/2019 21:39   Dg Chest Portable 1 View  Result Date: 05/10/2019 CLINICAL DATA:  63 year old male with productive cough for 4 days and shortness of breath. Smoker. EXAM: PORTABLE CHEST 1 VIEW COMPARISON:  Portable chest 01/26/2019. FINDINGS: Portable AP upright view at 1800 hours. Coarse bilateral increased interstitial markings are stable since March. Cardiac size is at the upper limits of normal to mildly enlarged. Other mediastinal contours are within normal limits. Visualized tracheal air column is within normal limits. No pneumothorax, pleural effusion or  acute pulmonary opacity. No acute osseous abnormality identified. IMPRESSION: Suspect chronic pulmonary interstitial disease. No acute cardiopulmonary abnormality. Electronically Signed   By: Genevie Ann M.D.   On: 05/10/2019 18:33    ____________________________________________   PROCEDURES  Procedure(s) performed:   Procedures  None ____________________________________________   INITIAL IMPRESSION / ASSESSMENT AND PLAN / ED COURSE  Pertinent labs & imaging results that were available during my care of the patient were reviewed by me and considered in my medical decision making (see chart for details).   Patient presents to the emergency department with dyspnea both with exertion and at rest now.  Has developed productive cough over the past 4 days.  Outpatient COVID-19 test is pending but given patient's worsening symptoms plan for rapid test.  Lower suspicion for this clinically, however, without fever.  Patient appears moderately volume overloaded which I suspect is the reason for his shortness of breath.  Low suspicion for VTE.  Patient is anticoagulated and compliant with his medications.   08:00 PM  Reevaluated the patient after reviewing his lab work and chest x-ray.  No fluid on chest x-ray but suspect this is CHF related.  Ordered IV  Lasix and plan to admit with increased work of breathing although patient is not hypoxemic.  Discussing the plan for admit and diuresis the patient is very resistant.  He states he has animals at home that need to be fed.  We had a Tony Alvarez discussion about his health and my concern regarding potential deterioration at home and that I would advise that he be admitted to the hospital.  Patient understands this but states there is no one who can care for his animals.  Offered to help him find contact information for neighbors and/or friends but he states he left his cell phone at home and cannot recall any numbers.  He agrees to stay for second troponin, IV Lasix.   I added on a CT scan of the chest the patient is unsure regarding his home medications.  He states he is taking them but cannot recall the names and so want to further evaluate for PE prior to discharge.   CT with questionable early PNA. Will cover with abx. No other significant findings. No PE. Will add 4 days of lasix and have the patient f/u closely with PCP and Cardiology.  ____________________________________________  FINAL CLINICAL IMPRESSION(S) / ED DIAGNOSES  Final diagnoses:  SOB (shortness of breath)  Cough     MEDICATIONS GIVEN DURING THIS VISIT:  Medications  furosemide (LASIX) injection 60 mg (60 mg Intravenous Given 05/10/19 2026)  iohexol (OMNIPAQUE) 350 MG/ML injection 100 mL (100 mLs Intravenous Contrast Given 05/10/19 2112)  doxycycline (VIBRA-TABS) tablet 100 mg (100 mg Oral Given 05/10/19 2221)     NEW OUTPATIENT MEDICATIONS STARTED DURING THIS VISIT:  Discharge Medication List as of 05/10/2019  9:46 PM    START taking these medications   Details  doxycycline (VIBRAMYCIN) 100 MG capsule Take 1 capsule (100 mg total) by mouth 2 (two) times daily for 7 days., Starting Tue 05/10/2019, Until Tue 05/17/2019, Print    furosemide (LASIX) 20 MG tablet Take 1 tablet (20 mg total) by mouth daily for 5 days., Starting Tue 05/10/2019, Until Sun 05/15/2019, Print        Note:  This document was prepared using Dragon voice recognition software and may include unintentional dictation errors.  Nanda Quinton, MD Emergency Medicine    Lilyanne [place], [facility], MD 05/11/19 [phone]

## 2019-05-10 NOTE — Discharge Instructions (Signed)
The emergency department today with cough and shortness of breath.  I am starting you on 5 days of Lasix and 7 days of antibiotics.  There was a possible developing pneumonia on your CT scan.  Please call your primary care doctor first thing tomorrow morning to schedule the next available appointment.  Return to the emergency department immediately with any new or suddenly worsening symptoms.

## 2019-05-10 NOTE — Telephone Encounter (Signed)
Janett Billow from [facility] called. She states the patient called her office to report a white frothy cough, nasal congestion , runny nose and a headache. Due to the patient history of heart failure, CMA wants to make sure they are not overlooking possible cardiac issues. CMA also reported that the patient has had SOB since a MI .   Janett Billow also states that the patient has a pending COVID test that was done yesterday. She wants to know if the patient should be seen.  Please do not hesitate to contact her if necessary.

## 2019-05-12 LAB — NOVEL CORONAVIRUS, NAA: SARS-CoV-2, NAA: NOT DETECTED

## 2019-05-16 NOTE — Progress Notes (Signed)
Cardiology Office Note   Date:  05/17/2019   ID:  Tony Alvarez, DOB 11/08/1956, MRN [id number]  PCP:  [facility]  Cardiologist: Dr.  Claiborne Billings  Chief complaint: [place] ED follow-up pneumonia  History of Present Illness: Tony Alvarez is a 63 y.o. male who presents for ongoing assessment of hypertension, HLD, OSA, hypothyroidism., CAD. Cardiac cath 01/26/2019 revealed 100% proximal LAD occlusion treated with a resolute onyx 3.0 x 31mm DES,20% proximal RCA lesion, 20% proximal left circumflex lesion, 20% proximal LAD lesion and a 40% mid LAD lesion, moderate LV dysfunction with LVEDP of 20 mmHg.Echocardiogram 01/27/2019,  EF of 35%-40% with severe akinesis of the LV, mid apical anteroseptal wall, anterior, wall, apical segment and inferoseptal wall,  small fixed thrombus in the anterior apical wall of the left ventricle.   The patient was seen in the emergency room on 05/10/2019 with complaints of dyspnea.  He was negative COVID-19.  He had had worsening shortness of breath and cough for approximately 4 to 6 days prior to seeing ED physician.  CT scan was negative for PE, he did have some LVH, emphysema was noted, with left lower lobe opacity suggestive of pneumonia versus atelectasis or scarring.  Chest x-ray revealed increased interstitial markings, chronic pulmonary interstitial disease.  The patient was started on doxycycline.    He reports that his breathing has improved some.  He was also given Lasix 20 mg daily to take for 5 days.  Lower extremity edema is eliminated.  He continues to have little bit of coughing, with specks of blood in sputum but no overt hemoptysis.Marland Kitchen  He was last seen by Almyra Deforest. PA on 03/31/2019. At that time, he was doing well, and was tolerating his medications. He was do for echocardiogram in August.   Past Medical History:  Diagnosis Date  . Barrett's esophagus 04/21/2018  . Depression   . High cholesterol   . Hypertension   . Hypothyroid   . Sleep  apnea    wears CPAP at home    Past Surgical History:  Procedure Laterality Date  . BIOPSY  05/24/2018   Procedure: BIOPSY;  Surgeon: Rogene Houston, MD;  [facility];  Service: Endoscopy;;  gastric  . COLONOSCOPY    . COLONOSCOPY N/A 05/24/2018   Procedure: COLONOSCOPY;  Surgeon: Rogene Houston, MD;  [facility];  Service: Endoscopy;  Laterality: N/A;  8:55  . COLONOSCOPY WITH ESOPHAGOGASTRODUODENOSCOPY (EGD)    . CORONARY STENT INTERVENTION N/A 01/26/2019   Procedure: CORONARY STENT INTERVENTION;  Surgeon: Troy Sine, MD;  Location: [place] [facility];  Service: Cardiovascular;  Laterality: N/A;  . CORONARY/GRAFT ACUTE MI REVASCULARIZATION N/A 01/26/2019   Procedure: Coronary/Graft Acute MI Revascularization;  Surgeon: Troy Sine, MD;  Location: [place] [facility];  Service: Cardiovascular;  Laterality: N/A;  . ESOPHAGOGASTRODUODENOSCOPY N/A 05/24/2018   Procedure: ESOPHAGOGASTRODUODENOSCOPY (EGD);  Surgeon: Rogene Houston, MD;  [facility];  Service: Endoscopy;  Laterality: N/A;  . LEFT HEART CATH AND CORONARY ANGIOGRAPHY N/A 01/26/2019   Procedure: LEFT HEART CATH AND CORONARY ANGIOGRAPHY;  Surgeon: Troy Sine, MD;  Location: [place] [facility];  Service: Cardiovascular;  Laterality: N/A;  . MEDIAL PARTIAL KNEE REPLACEMENT     2015  . POLYPECTOMY  05/24/2018   Procedure: POLYPECTOMY;  Surgeon: Rogene Houston, MD;  [facility];  Service: Endoscopy;;  colon  . rt hand injury    . shoulder surgery for  bursitis    . TONSILLECTOMY       Current Outpatient Medications  Medication Sig Dispense Refill  . acetaminophen (TYLENOL) 500 MG tablet Take 500-1,000 mg by mouth daily as needed for mild pain or moderate pain.    . ARIPiprazole (ABILIFY) 10 MG tablet Take 10 mg by mouth every evening.    Marland Kitchen aspirin 81 MG chewable tablet Chew 1 tablet (81 mg total) by mouth daily.    Marland Kitchen atorvastatin (LIPITOR) 80 MG tablet Take 1 tablet  (80 mg total) by mouth daily at 6 PM. 90 tablet 3  . buPROPion (WELLBUTRIN SR) 150 MG 12 hr tablet Take 150-300 mg by mouth See admin instructions. 300mg  in the morning and 150mg  in the afternoon  0  . carvedilol (COREG) 3.125 MG tablet Take 1 tablet (3.125 mg total) by mouth 2 (two) times daily with a meal. (Patient taking differently: Take 3.125 mg by mouth every morning. ) 60 tablet 5  . clopidogrel (PLAVIX) 75 MG tablet Take 1 tablet (75 mg total) by mouth daily. 90 tablet 3  . doxycycline (VIBRAMYCIN) 100 MG capsule Take 1 capsule (100 mg total) by mouth 2 (two) times daily for 7 days. 14 capsule 0  . escitalopram (LEXAPRO) 20 MG tablet Take 20 mg by mouth daily.    Marland Kitchen levothyroxine (SYNTHROID) 100 MCG tablet Take 100 mcg by mouth daily before breakfast.     . losartan (COZAAR) 100 MG tablet Take 100 mg by mouth daily.    . nitroGLYCERIN (NITROSTAT) 0.4 MG SL tablet Place 1 tablet (0.4 mg total) under the tongue every 5 (five) minutes x 3 doses as needed for chest pain. 25 tablet 3  . rivaroxaban (XARELTO) 20 MG TABS tablet Take 1 tablet (20 mg total) by mouth daily. 30 tablet 11  . spironolactone (ALDACTONE) 25 MG tablet Take 1 tablet (25 mg total) by mouth daily. 90 tablet 3  . furosemide (LASIX) 20 MG tablet Take 1 tablet (20 mg total) by mouth as needed. 30 tablet 6   No current facility-administered medications for this visit.     Allergies:   Patient has no known allergies.    Social History:  The patient  reports that he has been smoking cigarettes. He has a 10.00 pack-year smoking history. He has never used smokeless tobacco. He reports current alcohol use. He reports that he does not use drugs.   Family History:  The patient's family history is not on file.    ROS: All other systems are reviewed and negative. Unless otherwise mentioned in H&P    PHYSICAL EXAM: VS:  BP 118/82 (BP Location: Left Arm, Patient Position: Sitting, Cuff Size: Normal)   Pulse 89   Temp 98.2 F  (36.8 C)   Ht 5\' 10"  (1.778 m)   Wt 241 lb (109.3 kg)   SpO2 97%   BMI 34.58 kg/m  , BMI Body mass index is 34.58 kg/m. GEN: Well nourished, well developed, in no acute distress HEENT: normal Neck: no JVD, carotid bruits, or masses Cardiac: RRR; no murmurs, rubs, or gallops,no edema  Respiratory: No wheezing, or coughing, he does have some crackling in the left base, scattered crackling in the right base. GI: soft, nontender, nondistended, + BS MS: no deformity or atrophy Skin: warm and dry, no rash Neuro:  Strength and sensation are intact Psych: euthymic mood, full affect   EKG: Not completed during this office visit.  Recent Labs: 01/29/2019: TSH 4.386 01/30/2019: Magnesium 2.1 05/10/2019: ALT 12;  B Natriuretic Peptide 758.0; BUN 10; Creatinine, Ser 0.98; Hemoglobin 12.3; Platelets 171; Potassium 3.7; Sodium 140    Lipid Panel    Component Value Date/Time   CHOL 113 03/28/2019 0834   TRIG 136 03/28/2019 0834   HDL 27 (L) 03/28/2019 0834   CHOLHDL 4.2 03/28/2019 0834   CHOLHDL 5.6 01/26/2019 2056   VLDL 38 01/26/2019 2056   LDLCALC 59 03/28/2019 0834      Wt Readings from Last 3 Encounters:  05/17/19 241 lb (109.3 kg)  05/10/19 236 lb (107 kg)  03/31/19 236 lb (107 kg)      Other studies Reviewed: Echo 01/27/2019 1. The left ventricle has moderately reduced systolic function, with an ejection fraction of 35-40%. There is mildly increased left ventricular wall thickness. 2. Severe akinesis of the left ventricular, mid-apical anteroseptal wall, anterior wall, apical segment and inferoapical wall. 3. Small, fixed thrombus on the anteroapical wall of the left ventricle.   Cath 01/26/2019  Prox RCA lesion is 20% stenosed.  Prox Cx lesion is 20% stenosed.  Prox LAD-1 lesion is 100% stenosed.  Post intervention, there is a 0% residual stenosis.  Prox LAD-2 lesion is 20% stenosed.  Mid LAD lesion is 40% stenosed.  LV end diastolic pressure is mildly  elevated.  There is moderate left ventricular systolic dysfunction.  A stent was successfully placed.  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  Almost a common ostium left main with immediate trifurcation into the LAD, small ramus intermediate vessel and moderate size circumflex vessel. The ramus vessel is normal. The circumflex vessel has 20% proximal narrowing and gave rise to 2 marginal branches. The RCA is a dominant vessel with moderate luminal irregularity with 20% narrowing proximally 40% mid stenosis and 30% narrowing proximal to the acute margin.  Probable moderate acute LV dysfunction with hypocontractility involving the anterolateral wall and apex. LVEDP 22 mm.  Successful PCI to the totally occluded LAD with PTCA and ultimate insertion of a 3.0 x 22 mm Resolute Onyx stent postdilated to 3.26 mm with the 100% occlusion being reduced to 0% and TIMI 0 flow improving to TIMI-3 flow. Once reperfusion was established, LAD had 20% mid LAD smooth narrowing and 40% distal smooth stenosis. The LAD wrapped around the apex and supplied the distal third of the inferior wall.  RECOMMENDATION: DAPT for minimum of 1 year. Optimal blood pressure control with ideal blood pressure less than 120/80. High potency statin therapy with target LDL less than 70. Medical therapy for concomitant CAD. A 2D echo Doppler study will be ordered improved LV function assessment.    ASSESSMENT AND PLAN:  1.  Pneumonia with interstitial pulmonary disease: Recently treated in the ED on 05/10/2019 with doxycycline.  He is not connected with a pulmonologist.  I will refer him to Dr. at [facility] in [place] as that is where he currently resides.  If unable to be seen by Dr. Luan Pulling will refer him to [facility] pulmonologist in [place].  CT scan revealed emphysema as well.   He will follow-up with PCP for ongoing management.  2.  Coronary artery disease: Most recent cardiac cath, March 2020 revealed a 100% proximal LAD occlusion treated with resolute Onyx 3.1 x 22 mm drug-eluting stent, 20% proximal RCA lesion, 20% proximal left circumflex lesion, 20% proximal LAD lesion and 40% mid LAD lesion with moderate LV dysfunction.  He will continue on  current medication regimen he has not had any recurrent chest pain.  Carvedilol 3.125 mg twice daily.  No wheezing is noted on beta-blocker.  3.  Ischemic cardiomyopathy: EF of 35% per recent catheterization March 2020.  He is on beta-blocker, spironolactone, renewing Lasix at 20 mg daily as needed, and losartan 100 mg daily.  Follow-up echo is pending.  Current medicines are reviewed at length with the patient today.    Labs/ tests ordered today include: Echocardiogram Phill Myron. [place] Pugh, ANP, AACC   05/17/2019 10:06 AM    [facility] [place] [address] [phone] Fax [phone]

## 2019-05-17 ENCOUNTER — Ambulatory Visit: Payer: BC Managed Care – PPO | Admitting: Adult Health

## 2019-05-17 ENCOUNTER — Telehealth: Payer: Self-pay | Admitting: *Deleted

## 2019-05-17 ENCOUNTER — Other Ambulatory Visit: Payer: Self-pay

## 2019-05-17 ENCOUNTER — Encounter: Payer: Self-pay | Admitting: Adult Health

## 2019-05-17 ENCOUNTER — Ambulatory Visit
Admission: RE | Admit: 2019-05-17 | Discharge: 2019-05-17 | Disposition: A | Discharge status: Home / Self Care | Payer: BC Managed Care – PPO | Source: Ambulatory Visit | Attending: Adult Health | Admitting: Adult Health

## 2019-05-17 VITALS — BP 118/82 | HR 89 | Temp 98.2°F | Ht 70.0 in | Wt 241.0 lb

## 2019-05-17 DIAGNOSIS — I43 Cardiomyopathy in diseases classified elsewhere: Secondary | ICD-10-CM

## 2019-05-17 DIAGNOSIS — R0602 Shortness of breath: Secondary | ICD-10-CM | POA: Diagnosis not present

## 2019-05-17 DIAGNOSIS — J129 Viral pneumonia, unspecified: Secondary | ICD-10-CM

## 2019-05-17 DIAGNOSIS — J439 Emphysema, unspecified: Secondary | ICD-10-CM

## 2019-05-17 DIAGNOSIS — I2510 Atherosclerotic heart disease of native coronary artery without angina pectoris: Secondary | ICD-10-CM | POA: Diagnosis not present

## 2019-05-17 MED ORDER — FUROSEMIDE 20 MG PO TABS: 20.0000 mg | ORAL_TABLET | ORAL | 6 refills | Status: DC | PRN

## 2019-05-17 NOTE — Telephone Encounter (Signed)
Referral send to [place], pt made aware

## 2019-05-17 NOTE — Patient Instructions (Signed)
Medication Instructions:  Continue current medications  If you need a refill on your cardiac medications before your next appointment, please call your pharmacy.  Labwork: None Ordered   Testing/Procedures: A chest x-ray takes a picture of the organs and structures inside the chest, including the heart, lungs, and blood vessels. This test can show several things, including, whether the heart is enlarges; whether fluid is building up in the lungs; and whether pacemaker / defibrillator leads are still in place.  Follow-Up: You have been referred to [facility] pulmonologist  Your physician recommends that you schedule a follow-up appointment in: Keep follow up appointment with Billey Chang in August  At [facility], you and your health needs are our priority.  As part of our continuing mission to provide you with exceptional heart care, we have created designated Provider Care Teams.  These Care Teams include your primary Cardiologist (physician) and Advanced Practice Providers (APPs -  Physician Assistants and Nurse Practitioners) who all work together to provide you with the care you need, when you need it.  Thank you for choosing [facility] at [facility]!!

## 2019-05-17 NOTE — Telephone Encounter (Signed)
-----   Message from Lendon Colonel, NP sent at 05/17/2019 12:57 PM EDT ----- No evidence of pneumonia. He has minimal fluid in the left lower lung. Continue to take lasix 20 mg daily. Dr. Luan Pulling is retiring. We referred him to Dr. Luan Pulling for pulmonary evaluation. He will need to be seen by [facility] instead.

## 2019-05-18 ENCOUNTER — Telehealth: Payer: Self-pay | Admitting: *Deleted

## 2019-05-18 NOTE — Telephone Encounter (Signed)
Spoke with patient regarding pulmonary consult --scheduled for Monday 05/23/19 at 3:00pm---patient was given providername, address and pone number---he voiced his understanding.

## 2019-05-23 ENCOUNTER — Ambulatory Visit (HOSPITAL_COMMUNITY)
Admission: RE | Admit: 2019-05-23 | Discharge: 2019-05-23 | Disposition: A | Discharge status: Home / Self Care | Payer: BC Managed Care – PPO | Source: Ambulatory Visit | Attending: Physician Assistant | Admitting: Physician Assistant

## 2019-05-23 ENCOUNTER — Institutional Professional Consult (permissible substitution): Payer: BC Managed Care – PPO | Admitting: Pulmonary Disease

## 2019-05-23 ENCOUNTER — Other Ambulatory Visit: Payer: Self-pay

## 2019-05-23 DIAGNOSIS — R0602 Shortness of breath: Secondary | ICD-10-CM | POA: Diagnosis not present

## 2019-05-23 DIAGNOSIS — I2129 ST elevation (STEMI) myocardial infarction involving other sites: Secondary | ICD-10-CM | POA: Insufficient documentation

## 2019-05-23 MED ORDER — PERFLUTREN LIPID MICROSPHERE
1.0000 mL | INTRAVENOUS | Status: AC | PRN
  Administered 2019-05-23: 2 mL via INTRAVENOUS
  Administered 2019-05-23 (×2): 1 mL via INTRAVENOUS
  Filled 2019-05-23: qty 10

## 2019-05-23 NOTE — Progress Notes (Signed)
*  PRELIMINARY RESULTS* Echocardiogram 2D Echocardiogram has been performed with [facility].  Samuel Germany 05/23/2019, 11:31 AM

## 2019-05-25 ENCOUNTER — Ambulatory Visit: Payer: BC Managed Care – PPO | Admitting: Pulmonary Disease

## 2019-05-25 ENCOUNTER — Encounter: Payer: Self-pay | Admitting: Pulmonary Disease

## 2019-05-25 ENCOUNTER — Other Ambulatory Visit: Payer: Self-pay

## 2019-05-25 VITALS — BP 108/72 | HR 103 | Temp 98.3°F | Ht 70.0 in | Wt 237.8 lb

## 2019-05-25 DIAGNOSIS — R0602 Shortness of breath: Secondary | ICD-10-CM | POA: Diagnosis not present

## 2019-05-25 NOTE — Patient Instructions (Signed)
Shortness of breath/COPD emphysema  We will get a breathing study Will start you on albuterol to be used as needed  We may put you on other inhalers  Encourage you to quit smoking  Exercise as tolerated  Call with significant concerns

## 2019-05-25 NOTE — Progress Notes (Signed)
Subjective:    Patient ID: Tony Alvarez, male    DOB: 07-14-1956, 63 y.o.   MRN: [id number]  Patient being seen for shortness of breath  Shortness of breath usually more significant when he is laying flat Shortness of breath with any activity  Recent MI in March Active smoker about 7 sticks of cigarettes a day  No protracted cough Shortness of breath with activity Denies any chest pains or discomfort    Review of Systems  Constitutional: Negative for fever and unexpected weight change.  HENT: Negative for congestion, dental problem, ear pain, nosebleeds, postnasal drip, rhinorrhea, sinus pressure, sneezing, sore throat and trouble swallowing.   Eyes: Negative for redness and itching.  Respiratory: Positive for cough, chest tightness and shortness of breath. Negative for wheezing.   Cardiovascular: Positive for leg swelling. Negative for palpitations.  Gastrointestinal: Negative for nausea and vomiting.  Genitourinary: Negative for dysuria.  Musculoskeletal: Negative for joint swelling.  Skin: Negative for rash.  Allergic/Immunologic: Negative.  Negative for environmental allergies, food allergies and immunocompromised state.  Neurological: Positive for headaches.  Hematological: Does not bruise/bleed easily.  Psychiatric/Behavioral: Negative for dysphoric mood.   Past Medical History:  Diagnosis Date  . Barrett's esophagus 04/21/2018  . Depression   . High cholesterol   . Hypertension   . Hypothyroid   . Sleep apnea    wears CPAP at home   Social History   Socioeconomic History  . Marital status: Widowed    Spouse name: Not on file  . Number of children: Not on file  . Years of education: Not on file  . Highest education level: Not on file  Occupational History  . Not on file  Social Needs  . Financial resource strain: Not on file  . Food insecurity    Worry: Not on file    Inability: Not on file  . Transportation needs    Medical: Not on file   Non-medical: Not on file  Tobacco Use  . Smoking status: Current Every Day Smoker    Packs/day: 0.25    Years: 40.00    Pack years: 10.00    Types: Cigarettes  . Smokeless tobacco: Never Used  . Tobacco comment: 7 cigarettes per day 05/25/19  Substance and Sexual Activity  . Alcohol use: Yes    Frequency: Never    Comment: very rare  . Drug use: Never  . Sexual activity: Not on file  Lifestyle  . Physical activity    Days per week: Not on file    Minutes per session: Not on file  . Stress: Not on file  Relationships  . Social Herbalist on phone: Not on file    Gets together: Not on file    Attends religious service: Not on file    Active member of club or organization: Not on file    Attends meetings of clubs or organizations: Not on file    Relationship status: Not on file  . Intimate partner violence    Fear of current or ex partner: Not on file    Emotionally abused: Not on file    Physically abused: Not on file    Forced sexual activity: Not on file  Other Topics Concern  . Not on file  Social History Narrative  . Not on file   Family History  Problem Relation Age of Onset  . Colon cancer Neg Hx       Objective:   Physical Exam Constitutional:  Appearance: Normal appearance.  Neck:     Musculoskeletal: Normal range of motion and neck supple.  Cardiovascular:     Rate and Rhythm: Normal rate and regular rhythm.  Pulmonary:     Effort: Pulmonary effort is normal. No respiratory distress.     Breath sounds: Normal breath sounds. No stridor. No wheezing.  Abdominal:     General: There is no distension.     Palpations: Abdomen is soft. There is no mass.  Musculoskeletal: Normal range of motion.        General: No swelling.  Skin:    General: Skin is warm and dry.     Coloration: Skin is not pale.  Neurological:     General: No focal deficit present.     Mental Status: He is oriented to person, place, and time.  Psychiatric:        Mood  and Affect: Mood normal.        Behavior: Behavior normal.    Vitals:   05/25/19 1103  BP: 108/72  Pulse: (!) 103  Temp: 98.3 F (36.8 C)  SpO2: 97%   .  CT scan of the chest reviewed showing extensive emphysema .  Recent echocardiogram with an ejection fraction of 25%     Assessment & Plan:  .  Multifactorial shortness of breath  .  Abnormal CT showing extensive emphysema  .  Cardiomyopathy  .  Deconditioning  .  Active smoker  Plan: .  Smoking cessation counseling .  Initiate albuterol use .  Demonstrated how to use inhaler-by pharmacist .  Graded exercise as tolerated .  We will plan on escalating inhalers once PFT available -Order pulmonary function study -Weight loss and increased activity discussed .

## 2019-05-26 ENCOUNTER — Telehealth: Payer: Self-pay | Admitting: *Deleted

## 2019-05-26 DIAGNOSIS — Z79899 Other long term (current) drug therapy: Secondary | ICD-10-CM

## 2019-05-26 MED ORDER — ENTRESTO 24-26 MG PO TABS: 1.0000 | ORAL_TABLET | Freq: Two times a day (BID) | ORAL | 6 refills | Status: DC

## 2019-05-26 NOTE — Telephone Encounter (Signed)
Pt aware of his Echo, pt was advised to stop losartan and start Entresto 24/26 mg and have BMP in 2 weeks, will try to get appt with Dr Claiborne Billings

## 2019-05-26 NOTE — Telephone Encounter (Signed)
-----   Message from Lendon Colonel, NP sent at 05/24/2019  4:48 PM EDT ----- Due to worsening EF from 35% to 25%. He will need to stop losartan and begin Entresto 24 mg/26 mg BID. He needs follow up with [name] for further titration of his medications. He may need to be referred for ICD for prevention of ventricular tachycardia. Please make sure he is seen on follow up by [name]. He will need a repeat BMET in 2 weeks after starting the [facility] for kidney function.

## 2019-06-01 ENCOUNTER — Other Ambulatory Visit: Payer: Self-pay

## 2019-06-01 ENCOUNTER — Telehealth: Payer: Self-pay

## 2019-06-01 MED ORDER — ENTRESTO 24-26 MG PO TABS: 1.0000 | ORAL_TABLET | Freq: Two times a day (BID) | ORAL | 6 refills | Status: DC

## 2019-06-01 NOTE — Telephone Encounter (Signed)
Completed PA for patient Tony Alvarez-  It was approved through insurance from 06/01/2019- 05/31/2020.

## 2019-06-23 ENCOUNTER — Telehealth: Payer: Self-pay | Admitting: Pulmonary Disease

## 2019-06-23 MED ORDER — ALBUTEROL SULFATE HFA 108 (90 BASE) MCG/ACT IN AERS: 2.0000 | INHALATION_SPRAY | Freq: Four times a day (QID) | RESPIRATORY_TRACT | 1 refills | Status: DC | PRN

## 2019-06-23 NOTE — Telephone Encounter (Signed)
Call returned to Dr/ Roberson's office, Spoke with Janett Billow.   Made aware of AVS summary:  Instructions  Shortness of breath/COPD emphysema  We will get a breathing study Will start you on albuterol to be used as needed     Voiced understanding. She asked that I give him a call and remind him of this.   Call made to patient, made aware of the above. Albuterol inhaler sent in, pharmacy confirmed. Instructed on use of inhaler. Voice understanding. Nothing further needed at this time.

## 2019-06-24 ENCOUNTER — Other Ambulatory Visit: Payer: Self-pay

## 2019-06-24 ENCOUNTER — Encounter (HOSPITAL_COMMUNITY)
Admission: EM | Disposition: A | Discharge status: Home / Self Care | Payer: Self-pay | Source: Home / Self Care | Attending: Emergency Medicine

## 2019-06-24 ENCOUNTER — Observation Stay (HOSPITAL_COMMUNITY)
Admission: EM | Admit: 2019-06-24 | Discharge: 2019-06-25 | Disposition: A | Discharge status: Home / Self Care | Payer: BC Managed Care – PPO | Attending: Internal Medicine | Admitting: Internal Medicine

## 2019-06-24 ENCOUNTER — Encounter (HOSPITAL_COMMUNITY): Payer: Self-pay

## 2019-06-24 ENCOUNTER — Emergency Department (HOSPITAL_COMMUNITY): Payer: BC Managed Care – PPO

## 2019-06-24 DIAGNOSIS — I110 Hypertensive heart disease with heart failure: Secondary | ICD-10-CM | POA: Insufficient documentation

## 2019-06-24 DIAGNOSIS — Z20828 Contact with and (suspected) exposure to other viral communicable diseases: Secondary | ICD-10-CM | POA: Insufficient documentation

## 2019-06-24 DIAGNOSIS — I5023 Acute on chronic systolic (congestive) heart failure: Secondary | ICD-10-CM | POA: Diagnosis not present

## 2019-06-24 DIAGNOSIS — F329 Major depressive disorder, single episode, unspecified: Secondary | ICD-10-CM | POA: Diagnosis not present

## 2019-06-24 DIAGNOSIS — R0602 Shortness of breath: Principal | ICD-10-CM | POA: Insufficient documentation

## 2019-06-24 DIAGNOSIS — J449 Chronic obstructive pulmonary disease, unspecified: Secondary | ICD-10-CM | POA: Insufficient documentation

## 2019-06-24 DIAGNOSIS — Z79890 Hormone replacement therapy (postmenopausal): Secondary | ICD-10-CM | POA: Diagnosis not present

## 2019-06-24 DIAGNOSIS — F17200 Nicotine dependence, unspecified, uncomplicated: Secondary | ICD-10-CM | POA: Insufficient documentation

## 2019-06-24 DIAGNOSIS — K2270 Barrett's esophagus without dysplasia: Secondary | ICD-10-CM | POA: Diagnosis not present

## 2019-06-24 DIAGNOSIS — I2510 Atherosclerotic heart disease of native coronary artery without angina pectoris: Secondary | ICD-10-CM | POA: Insufficient documentation

## 2019-06-24 DIAGNOSIS — R918 Other nonspecific abnormal finding of lung field: Secondary | ICD-10-CM | POA: Insufficient documentation

## 2019-06-24 DIAGNOSIS — Z79899 Other long term (current) drug therapy: Secondary | ICD-10-CM | POA: Diagnosis not present

## 2019-06-24 DIAGNOSIS — G4733 Obstructive sleep apnea (adult) (pediatric): Secondary | ICD-10-CM | POA: Insufficient documentation

## 2019-06-24 DIAGNOSIS — Z955 Presence of coronary angioplasty implant and graft: Secondary | ICD-10-CM | POA: Diagnosis not present

## 2019-06-24 DIAGNOSIS — Z7902 Long term (current) use of antithrombotics/antiplatelets: Secondary | ICD-10-CM | POA: Insufficient documentation

## 2019-06-24 DIAGNOSIS — I2129 ST elevation (STEMI) myocardial infarction involving other sites: Secondary | ICD-10-CM

## 2019-06-24 DIAGNOSIS — E7800 Pure hypercholesterolemia, unspecified: Secondary | ICD-10-CM | POA: Insufficient documentation

## 2019-06-24 DIAGNOSIS — Z7982 Long term (current) use of aspirin: Secondary | ICD-10-CM | POA: Insufficient documentation

## 2019-06-24 DIAGNOSIS — R0902 Hypoxemia: Secondary | ICD-10-CM

## 2019-06-24 DIAGNOSIS — E039 Hypothyroidism, unspecified: Secondary | ICD-10-CM | POA: Diagnosis not present

## 2019-06-24 DIAGNOSIS — R9431 Abnormal electrocardiogram [ECG] [EKG]: Secondary | ICD-10-CM | POA: Diagnosis not present

## 2019-06-24 DIAGNOSIS — I252 Old myocardial infarction: Secondary | ICD-10-CM | POA: Insufficient documentation

## 2019-06-24 DIAGNOSIS — J189 Pneumonia, unspecified organism: Secondary | ICD-10-CM

## 2019-06-24 DIAGNOSIS — F32A Depression, unspecified: Secondary | ICD-10-CM | POA: Diagnosis present

## 2019-06-24 DIAGNOSIS — F1010 Alcohol abuse, uncomplicated: Secondary | ICD-10-CM | POA: Diagnosis present

## 2019-06-24 DIAGNOSIS — I255 Ischemic cardiomyopathy: Secondary | ICD-10-CM | POA: Insufficient documentation

## 2019-06-24 DIAGNOSIS — E8770 Fluid overload, unspecified: Secondary | ICD-10-CM

## 2019-06-24 DIAGNOSIS — Z7901 Long term (current) use of anticoagulants: Secondary | ICD-10-CM | POA: Diagnosis not present

## 2019-06-24 LAB — COMPREHENSIVE METABOLIC PANEL
ALT: 22 U/L (ref 0–44)
AST: 22 U/L (ref 15–41)
Albumin: 3.7 g/dL (ref 3.5–5.0)
Alkaline Phosphatase: 68 U/L (ref 38–126)
Anion gap: 12 (ref 5–15)
BUN: 13 mg/dL (ref 8–23)
CO2: 19 mmol/L — ABNORMAL LOW (ref 22–32)
Calcium: 9.2 mg/dL (ref 8.9–10.3)
Chloride: 110 mmol/L (ref 98–111)
Creatinine, Ser: 1.23 mg/dL (ref 0.61–1.24)
GFR calc Af Amer: >60 mL/min (ref >60)
GFR calc non Af Amer: >60 mL/min (ref >60)
Glucose, Bld: 144 mg/dL — ABNORMAL HIGH (ref 70–99)
Potassium: 3.9 mmol/L (ref 3.5–5.1)
Sodium: 141 mmol/L (ref 135–145)
Total Bilirubin: 1.2 mg/dL (ref 0.3–1.2)
Total Protein: 6.5 g/dL (ref 6.5–8.1)

## 2019-06-24 LAB — CBC WITH DIFFERENTIAL/PLATELET
Abs Immature Granulocytes: 0.02 10*3/uL (ref 0.00–0.07)
Basophils Absolute: 0.1 10*3/uL (ref 0.0–0.1)
Basophils Relative: 1 %
Eosinophils Absolute: 0.3 10*3/uL (ref 0.0–0.5)
Eosinophils Relative: 4 %
HCT: 41.9 % (ref 39.0–52.0)
Hemoglobin: 13.6 g/dL (ref 13.0–17.0)
Immature Granulocytes: 0 %
Lymphocytes Relative: 25 %
Lymphs Abs: 1.7 10*3/uL (ref 0.7–4.0)
MCH: 28.9 pg (ref 26.0–34.0)
MCHC: 32.5 g/dL (ref 30.0–36.0)
MCV: 89.1 fL (ref 80.0–100.0)
Monocytes Absolute: 0.3 10*3/uL (ref 0.1–1.0)
Monocytes Relative: 5 %
Neutro Abs: 4.3 10*3/uL (ref 1.7–7.7)
Neutrophils Relative %: 65 %
Platelets: 176 10*3/uL (ref 150–400)
RBC: 4.7 MIL/uL (ref 4.22–5.81)
RDW: 17.9 % — ABNORMAL HIGH (ref 11.5–15.5)
WBC: 6.6 10*3/uL (ref 4.0–10.5)
nRBC: 0 % (ref 0.0–0.2)

## 2019-06-24 LAB — D-DIMER, QUANTITATIVE: D-Dimer, Quant: 0.29 ug/mL-FEU (ref 0.00–0.50)

## 2019-06-24 LAB — TROPONIN I (HIGH SENSITIVITY): Troponin I (High Sensitivity): 17 ng/L (ref <18)

## 2019-06-24 LAB — MAGNESIUM: Magnesium: 1.8 mg/dL (ref 1.7–2.4)

## 2019-06-24 LAB — LIPASE, BLOOD: Lipase: 25 U/L (ref 11–51)

## 2019-06-24 LAB — BRAIN NATRIURETIC PEPTIDE: B Natriuretic Peptide: 943.6 pg/mL — ABNORMAL HIGH (ref 0.0–100.0)

## 2019-06-24 LAB — SARS CORONAVIRUS 2 BY RT PCR (HOSPITAL ORDER, PERFORMED IN ~~LOC~~ HOSPITAL LAB): SARS Coronavirus 2: NEGATIVE

## 2019-06-24 LAB — LACTIC ACID, PLASMA: Lactic Acid, Venous: 1.3 mmol/L (ref 0.5–1.9)

## 2019-06-24 SURGERY — INVASIVE LAB ABORTED CASE

## 2019-06-24 MED ORDER — CARVEDILOL 3.125 MG PO TABS
3.1250 mg | ORAL_TABLET | Freq: Two times a day (BID) | ORAL | Status: DC
  Administered 2019-06-25: 3.125 mg via ORAL
  Filled 2019-06-24: qty 1

## 2019-06-24 MED ORDER — SODIUM CHLORIDE 0.9 % IV SOLN: 500.0000 mg | Freq: Once | INTRAVENOUS | Status: DC

## 2019-06-24 MED ORDER — MAGNESIUM SULFATE IN D5W 1-5 GM/100ML-% IV SOLN
1.0000 g | Freq: Once | INTRAVENOUS | Status: AC
  Administered 2019-06-25: 1 g via INTRAVENOUS
  Filled 2019-06-24 (×2): qty 100

## 2019-06-24 MED ORDER — SACUBITRIL-VALSARTAN 24-26 MG PO TABS
1.0000 | ORAL_TABLET | Freq: Two times a day (BID) | ORAL | Status: DC
  Administered 2019-06-25: 1 via ORAL
  Filled 2019-06-24: qty 1

## 2019-06-24 MED ORDER — SODIUM CHLORIDE 0.9 % IV SOLN
1.0000 g | Freq: Once | INTRAVENOUS | Status: AC
  Administered 2019-06-25: 1 g via INTRAVENOUS
  Filled 2019-06-24: qty 10

## 2019-06-24 MED ORDER — ATORVASTATIN CALCIUM 80 MG PO TABS: 80.0000 mg | ORAL_TABLET | Freq: Every day | ORAL | Status: DC

## 2019-06-24 MED ORDER — BUPROPION HCL ER (SR) 150 MG PO TB12: 150.0000 mg | ORAL_TABLET | ORAL | Status: DC

## 2019-06-24 MED ORDER — ASPIRIN 81 MG PO CHEW
81.0000 mg | CHEWABLE_TABLET | Freq: Every day | ORAL | Status: DC
  Administered 2019-06-25: 81 mg via ORAL
  Filled 2019-06-24: qty 1

## 2019-06-24 MED ORDER — FUROSEMIDE 10 MG/ML IJ SOLN
20.0000 mg | Freq: Once | INTRAMUSCULAR | Status: AC
  Administered 2019-06-25: 20 mg via INTRAVENOUS
  Filled 2019-06-24: qty 2

## 2019-06-24 MED ORDER — ESCITALOPRAM OXALATE 10 MG PO TABS: 20.0000 mg | ORAL_TABLET | Freq: Every morning | ORAL | Status: DC

## 2019-06-24 MED ORDER — FUROSEMIDE 10 MG/ML IJ SOLN
20.0000 mg | Freq: Two times a day (BID) | INTRAMUSCULAR | Status: DC
  Administered 2019-06-25: 20 mg via INTRAVENOUS
  Filled 2019-06-24: qty 2

## 2019-06-24 MED ORDER — ALBUTEROL SULFATE HFA 108 (90 BASE) MCG/ACT IN AERS: 2.0000 | INHALATION_SPRAY | Freq: Four times a day (QID) | RESPIRATORY_TRACT | Status: DC | PRN

## 2019-06-24 MED ORDER — SPIRONOLACTONE 25 MG PO TABS
25.0000 mg | ORAL_TABLET | Freq: Every day | ORAL | Status: DC
  Administered 2019-06-25: 25 mg via ORAL
  Filled 2019-06-24: qty 1

## 2019-06-24 MED ORDER — CLOPIDOGREL BISULFATE 75 MG PO TABS
75.0000 mg | ORAL_TABLET | Freq: Every day | ORAL | Status: DC
  Administered 2019-06-25: 75 mg via ORAL
  Filled 2019-06-24: qty 1

## 2019-06-24 MED ORDER — SODIUM CHLORIDE 0.9 % IV SOLN
100.0000 mg | Freq: Once | INTRAVENOUS | Status: AC
  Administered 2019-06-25: 100 mg via INTRAVENOUS
  Filled 2019-06-24: qty 100

## 2019-06-24 MED ORDER — POTASSIUM CHLORIDE CRYS ER 20 MEQ PO TBCR
20.0000 meq | EXTENDED_RELEASE_TABLET | Freq: Once | ORAL | Status: AC
  Administered 2019-06-25: 20 meq via ORAL
  Filled 2019-06-24: qty 1

## 2019-06-24 MED ORDER — ARIPIPRAZOLE 10 MG PO TABS: 10.0000 mg | ORAL_TABLET | Freq: Every evening | ORAL | Status: DC

## 2019-06-24 SURGICAL SUPPLY — 8 items
GLIDESHEATH SLEND A-KIT 6F 22G (SHEATH) IMPLANT
GUIDEWIRE INQWIRE 1.5J.035X260 (WIRE) IMPLANT
INQWIRE 1.5J .035X260CM (WIRE)
KIT ENCORE 26 ADVANTAGE (KITS) IMPLANT
KIT HEART LEFT (KITS) IMPLANT
PACK CARDIAC CATHETERIZATION (CUSTOM PROCEDURE TRAY) IMPLANT
TRANSDUCER W/STOPCOCK (MISCELLANEOUS) IMPLANT
TUBING CIL FLEX 10 FLL-RA (TUBING) IMPLANT

## 2019-06-24 NOTE — ED Provider Notes (Signed)
[facility] Provider Note   CSN: [id number] Arrival date & time: 06/24/19  1959     History   Chief Complaint Chief Complaint  Patient presents with  . Code STEMI    HPI Tony Alvarez is a 63 y.o. male.     The history is provided by the patient, the EMS personnel and medical records. No language interpreter was used.  Shortness of Breath Severity:  Severe Onset quality:  Gradual Duration:  2 days Timing:  Constant Progression:  Worsening Chronicity:  New Relieved by:  Nothing Worsened by:  Deep breathing Ineffective treatments:  None tried Associated symptoms: cough, diaphoresis and sputum production   Associated symptoms: no abdominal pain, no chest pain, no fever, no headaches, no hemoptysis, no neck pain, no rash, no sore throat, no syncope, no vomiting and no wheezing   Risk factors: no hx of PE/DVT     Past Medical History:  Diagnosis Date  . Barrett's esophagus 04/21/2018  . Depression   . High cholesterol   . Hypertension   . Hypothyroid   . Sleep apnea    wears CPAP at home    Patient Active Problem List   Diagnosis Date Noted  . Hyperlipidemia 01/30/2019  . LV (left ventricular) mural thrombus with acute MI ([place]) 01/30/2019  . Alcohol abuse 01/30/2019  . Alcohol withdrawal delirium ([place]) 01/30/2019  . Elevated liver enzymes 01/30/2019  . Ischemic cardiomyopathy 01/30/2019  . Acute systolic heart failure ([place]) 01/30/2019  . STEMI (ST elevation myocardial infarction) ([place]) 01/26/2019  . Acute ST elevation myocardial infarction (STEMI) involving left anterior descending (LAD) coronary artery ([place]) 01/26/2019  . STEMI involving left anterior descending coronary artery ([place])   . History of colonic polyps 04/21/2018  . Barrett's esophagus 04/21/2018  . Barrett's esophagus without dysplasia 04/21/2018  . Rectal bleeding 04/21/2018    Past Surgical History:  Procedure Laterality Date  . BIOPSY  05/24/2018   Procedure: BIOPSY;  Surgeon: Rogene Houston, MD;  Location: [facility];  Service: Endoscopy;;  gastric  . COLONOSCOPY    . COLONOSCOPY N/A 05/24/2018   Procedure: COLONOSCOPY;  Surgeon: Rogene Houston, MD;  Location: [facility];  Service: Endoscopy;  Laterality: N/A;  8:55  . COLONOSCOPY WITH ESOPHAGOGASTRODUODENOSCOPY (EGD)    . CORONARY STENT INTERVENTION N/A 01/26/2019   Procedure: CORONARY STENT INTERVENTION;  Surgeon: Troy Sine, MD;  Location: [facility];  Service: Cardiovascular;  Laterality: N/A;  . CORONARY/GRAFT ACUTE MI REVASCULARIZATION N/A 01/26/2019   Procedure: Coronary/Graft Acute MI Revascularization;  Surgeon: Troy Sine, MD;  Location: [facility];  Service: Cardiovascular;  Laterality: N/A;  . ESOPHAGOGASTRODUODENOSCOPY N/A 05/24/2018   Procedure: ESOPHAGOGASTRODUODENOSCOPY (EGD);  Surgeon: Rogene Houston, MD;  Location: [facility];  Service: Endoscopy;  Laterality: N/A;  . LEFT HEART CATH AND CORONARY ANGIOGRAPHY N/A 01/26/2019   Procedure: LEFT HEART CATH AND CORONARY ANGIOGRAPHY;  Surgeon: Troy Sine, MD;  Location: [facility];  Service: Cardiovascular;  Laterality: N/A;  . MEDIAL PARTIAL KNEE REPLACEMENT     2015  . POLYPECTOMY  05/24/2018   Procedure: POLYPECTOMY;  Surgeon: Rogene Houston, MD;  Location: [facility];  Service: Endoscopy;;  colon  . rt hand injury    . shoulder surgery for bursitis    . TONSILLECTOMY          Home Medications    Prior to Admission medications   Medication Sig Start Date  End Date Taking? Authorizing Provider  acetaminophen (TYLENOL) 500 MG tablet Take 500-1,000 mg by mouth daily as needed for mild pain or moderate pain.    [provider]  albuterol (VENTOLIN HFA) 108 (90 Base) MCG/ACT inhaler Inhale 2 puffs into the lungs every 6 (six) hours as needed for wheezing or shortness of breath. 06/23/19   Olalere, Cicero Duck A, MD  ARIPiprazole (ABILIFY) 10 MG tablet Take 10 mg by  mouth every evening. 12/29/18   [provider]  aspirin 81 MG chewable tablet Chew 1 tablet (81 mg total) by mouth daily. 01/31/19   Almyra Deforest, PA  atorvastatin (LIPITOR) 80 MG tablet Take 1 tablet (80 mg total) by mouth daily at 6 PM. 01/30/19   Almyra Deforest, PA  bu[facility]ion [facility] SR) 150 MG 12 hr tablet Take 150-300 mg by mouth See admin instructions. 300mg  in the morning and 150mg  in the afternoon 03/27/18   [provider]  carvedilol (COREG) 3.125 MG tablet Take 1 tablet (3.125 mg total) by mouth 2 (two) times daily with a meal. Patient taking differently: Take 3.125 mg by mouth every morning.  01/30/19   Almyra Deforest, PA  clopidogrel (PLAVIX) 75 MG tablet Take 1 tablet (75 mg total) by mouth daily. 01/31/19   Almyra Deforest, PA  escitalopram (LEXAPRO) 20 MG tablet Take 20 mg by mouth daily.    [provider]  furosemide (LASIX) 20 MG tablet Take 1 tablet (20 mg total) by mouth as needed. 05/17/19   Lendon Colonel, NP  levothyroxine (SYNTHROID) 100 MCG tablet Take 100 mcg by mouth daily before breakfast.     [provider]  nitroGLYCERIN (NITROSTAT) 0.4 MG SL tablet Place 1 tablet (0.4 mg total) under the tongue every 5 (five) minutes x 3 doses as needed for chest pain. 01/30/19   Almyra Deforest, PA  rivaroxaban (XARELTO) 20 MG TABS tablet Take 1 tablet (20 mg total) by mouth daily. 01/31/19   Almyra Deforest, PA  sacubitril-valsartan (ENTRESTO) 24-26 MG Take 1 tablet by mouth 2 (two) times daily. 06/01/19   Troy Sine, MD  spironolactone (ALDACTONE) 25 MG tablet Take 1 tablet (25 mg total) by mouth daily. 01/31/19   Almyra Deforest, PA    Family History Family History  Problem Relation Age of Onset  . Colon cancer Neg Hx     Social History Social History   Tobacco Use  . Smoking status: Current Every Day Smoker    Packs/day: 0.25    Years: 40.00    Pack years: 10.00    Types: Cigarettes  . Smokeless tobacco: Never Used  . Tobacco comment: 7 cigarettes per day  05/25/19  Substance Use Topics  . Alcohol use: Yes    Frequency: Never    Comment: very rare  . Drug use: Never     Allergies   Patient has no known allergies.   Review of Systems Review of Systems  Constitutional: Positive for diaphoresis and fatigue. Negative for chills and fever.  HENT: Negative for congestion and sore throat.   Respiratory: Positive for cough, sputum production and shortness of breath. Negative for hemoptysis, chest tightness, wheezing and stridor.   Cardiovascular: Positive for leg swelling (bilateral edema over last week). Negative for chest pain, palpitations and syncope.  Gastrointestinal: Negative for abdominal pain, constipation, diarrhea, nausea and vomiting.  Genitourinary: Negative for dysuria and frequency.  Musculoskeletal: Negative for back pain, neck pain and neck stiffness.  Skin: Negative for rash.  Neurological: Negative for light-headedness  and headaches.  Psychiatric/Behavioral: Negative for agitation.  All other systems reviewed and are negative.    Physical Exam Updated Vital Signs There were no vitals taken for this visit.  Physical Exam Vitals signs and nursing note reviewed.  Constitutional:      General: He is not in acute distress.    Appearance: He is well-developed. He is not ill-appearing, toxic-appearing or diaphoretic.  HENT:     Head: Normocephalic and atraumatic.  Eyes:     Conjunctiva/sclera: Conjunctivae normal.     Pupils: Pupils are equal, round, and reactive to light.  Neck:     Musculoskeletal: Neck supple.  Cardiovascular:     Rate and Rhythm: Regular rhythm. Tachycardia present.     Heart sounds: No murmur.  Pulmonary:     Effort: Pulmonary effort is normal. Tachypnea present. No respiratory distress.     Breath sounds: Examination of the right-middle field reveals rhonchi and rales. Examination of the right-lower field reveals rhonchi and rales. Rhonchi and rales present.  Chest:     Chest wall: No  tenderness.  Abdominal:     Palpations: Abdomen is soft.     Tenderness: There is no abdominal tenderness.  Musculoskeletal:     Right lower leg: He exhibits no tenderness. Edema (mild) present.     Left lower leg: He exhibits no tenderness. Edema (mild) present.  Skin:    General: Skin is warm and dry.     Capillary Refill: Capillary refill takes less than 2 seconds.     Findings: No erythema.  Neurological:     General: No focal deficit present.     Mental Status: He is alert.      ED Treatments / Results  Labs (all labs ordered are listed, but only abnormal results are displayed) Labs Reviewed  CBC WITH DIFFERENTIAL/PLATELET - Abnormal; Notable for the following components:      Result Value   RDW 17.9 (*)    All other components within normal limits  COMPREHENSIVE METABOLIC PANEL - Abnormal; Notable for the following components:   CO2 19 (*)    Glucose, Bld 144 (*)    All other components within normal limits  BRAIN NATRIURETIC PEPTIDE - Abnormal; Notable for the following components:   B Natriuretic Peptide 943.6 (*)    All other components within normal limits  SARS CORONAVIRUS 2 (HOSPITAL ORDER, [place] [facility])  CULTURE, BLOOD (ROUTINE X 2)  CULTURE, BLOOD (ROUTINE X 2)  LIPASE, BLOOD  LACTIC ACID, PLASMA  MAGNESIUM  D-DIMER, QUANTITATIVE (NOT AT [facility])  LACTIC ACID, PLASMA  MAGNESIUM  PROCALCITONIN  TROPONIN I (HIGH SENSITIVITY)  TROPONIN I (HIGH SENSITIVITY)    EKG EKG Interpretation  Date/Time:  Friday June 24 2019 20:00:33 EDT Ventricular Rate:  97 PR Interval:    QRS Duration: 145 QT Interval:  429 QTC Calculation: 545 R Axis:   -81 Text Interpretation:  Sinus rhythm  Nonspecific IVCD with LAD Left ventricular hypertrophy Probable anterolateral infarct, recent When compared to prior, longer QTC.  NO STEMI Confirmed by Antony Blackbird [phone]) on 06/24/2019 8:24:16 PM   Radiology Dg Chest Portable 1 View  Result Date:  06/24/2019 CLINICAL DATA:  Shortness of breath, cough and fatigue EXAM: PORTABLE CHEST 1 VIEW COMPARISON:  05/17/2019 FINDINGS: Stable cardiomegaly. Diffuse bilateral interstitial opacity. Possible tiny pleural effusions. Aortic atherosclerosis. No pneumothorax. IMPRESSION: 1. Development of diffuse bilateral interstitial opacity, suspect for acute interstitial inflammatory process, possible atypical or viral infection 2. Cardiomegaly.  Possible  small pleural effusions. Electronically Signed   By: Donavan Foil M.D.   On: 06/24/2019 20:49    Procedures Procedures (including critical care time)  Medications Ordered in ED Medications  furosemide (LASIX) injection 20 mg (has no administration in time range)  cefTRIAXone (ROCEPHIN) 1 g in sodium chloride 0.9 % 100 mL IVPB (has no administration in time range)  aspirin chewable tablet 81 mg (has no administration in time range)  atorvastatin (LIPITOR) tablet 80 mg (has no administration in time range)  carvedilol (COREG) tablet 3.125 mg (has no administration in time range)  sacubitril-valsartan (ENTRESTO) 24-26 mg per tablet (has no administration in time range)  spironolactone (ALDACTONE) tablet 25 mg (has no administration in time range)  buPROPion (WELLBUTRIN SR) 12 hr tablet 150-300 mg (has no administration in time range)  clopidogrel (PLAVIX) tablet 75 mg (has no administration in time range)  furosemide (LASIX) injection 20 mg (has no administration in time range)  magnesium sulfate IVPB 1 g 100 mL (has no administration in time range)  potassium chloride SA (K-DUR) CR tablet 20 mEq (has no administration in time range)  doxycycline (VIBRAMYCIN) 100 mg in sodium chloride 0.9 % 250 mL IVPB (has no administration in time range)     Initial Impression / Assessment and Plan / ED Course  I have reviewed the triage vital signs and the nursing notes.  Pertinent labs & imaging results that were available during my care of the patient were  reviewed by me and considered in my medical decision making (see chart for details).        Tony Alvarez is a 63 y.o. male with a past medical history significant for prior STEMI with PCI, Barrett's esophagus, CHF, hypertension, hypothyroidism, and depression who presents with hypoxia, shortness of breath, cough, fatigue, diaphoresis.  Patient was activated as a code STEMI in the field for abnormal EKG in the setting of shortness of breath and diaphoresis.  Patient says that when he had his heart attack several months ago back in March, he had a chest aching which he is not having now.  He reports for the last several days he has had a cough with a productive sputum.  He reports no blood in the sputum.  He denies a history of DVT or PE and is currently on Xarelto.  He reports that he is having shortness of breath that has been intermittent since last night worsening today.  He reports that it is worse when he takes a deep breath.  He reports diaphoresis but denies nausea, vomiting, any discomfort whatsoever.  He denies any neurologic deficits, urinary symptoms or GI symptoms.  On exam, patient has crackles and rhonchi in his right lung worse than left.  Chest is nontender.  Patient has pulses in all extremities.  No murmur was appreciated.  Legs have very mild edema which he reports is new over the last week.  Patient was hypoxic on arrival and is on oxygen supplementation via nasal cannula to maintain oxygen saturations.  On arrival EKG showed a sinus rhythm but I do not see STEMI.  Will defer to cardiology team as to further management in this regard.  With his reported cough, fatigue, hypoxia, and lung sounds asymmetric, will work-up for possible pneumonia, will check for coronavirus, and will even look for PE with a d-dimer given the hypoxia and shortness of breath.  Anticipate admission for the hypoxia however will wait for further work-up.  10:50 PM Work-up is returned showing evidence of  both  fluid overload and possible pneumonia.  Opacities were seen on chest x-ray and given the patient's cough and hypoxia with fatigue, patient be treated for pneumonia.  The BNP is over 900 when it was in the 700s before.  Given the patient's bilateral lower extremity edema and this finding with hypoxia, patient will be given Lasix for likely mild fluid overload.  Coronavirus test was negative.  Patient is not septic.  Patient is still on several liters nasal cannula oxygen for hypoxia.  Patient will be admitted for further management, diuresis, and treatment of his likely pneumonia.    Final Clinical Impressions(s) / ED Diagnoses   Final diagnoses:  Community acquired pneumonia, unspecified laterality  Shortness of breath  Hypoxia  Hypervolemia, unspecified hypervolemia type    Clinical Impression: 1. Community acquired pneumonia, unspecified laterality   2. Shortness of breath   3. Hypoxia   4. Hypervolemia, unspecified hypervolemia type     Disposition: Admit  This note was prepared with assistance of Dragon voice recognition software. Occasional wrong-word or sound-a-like substitutions may have occurred due to the inherent limitations of voice recognition software.       Mattison Stuckey, Gwenyth Allegra, MD 06/25/19 [phone]

## 2019-06-24 NOTE — H&P (Signed)
History and Physical    Tony Alvarez WJX:[id number] DOB: November 18, 1955 DOA: 06/24/2019  PCP: A, [facility]   Patient coming from: Home   Chief Complaint: SOB   HPI: Tony Alvarez is a 63 y.o. male with medical history significant for coronary artery disease with STEMI in March treated with DES to LAD, ischemic cardiomyopathy with EF 25% in July 2020, alcohol abuse in remission, OSA, depression, and hypothyroidism, now presenting to the emergency department for evaluation of shortness of breath.  Patient reports that he recently developed some bilateral ankle swelling, has had a mild nonproductive cough, was more short of breath than usual yesterday, and then had worsening dyspnea today that became severe.  He has not been having any chest pain, but with the acute onset severe shortness of breath, he took 2 doses of nitroglycerin without any appreciable change.  He called EMS and was brought into the ED.  Patient denies any fevers or chills.  Reports that he quit drinking after the heart attack in March.  Denies sick contacts.  ED Course: Upon arrival to the ED, patient is found to be afebrile, saturating 99% on room air, tachypneic, and with blood pressure 92/59.  EKG features sinus rhythm with nonspecific IVCD, LAD, LVH, and QTc interval of 545 ms.  Chest x-ray reveals diffuse bilateral interstitial opacity suggestive of possible viral infection, as well as cardiomegaly and possibly small pleural effusion.  Chemistry panel is notable for bicarbonate of 19.  CBC is unremarkable.  D-dimer normal.  Initial troponin normal.  BNP elevated to 944.  Lactate normal.  COVID-19 negative.  Blood cultures were collected and the patient was treated with Lasix, Rocephin, and azithromycin.  Hospitalists are asked to admit.  Review of Systems:  All other systems reviewed and apart from HPI, are negative.  Past Medical History:  Diagnosis Date  . Barrett's esophagus 04/21/2018  . Depression   .  High cholesterol   . Hypertension   . Hypothyroid   . Sleep apnea    wears CPAP at home    Past Surgical History:  Procedure Laterality Date  . BIOPSY  05/24/2018   Procedure: BIOPSY;  Surgeon: Rogene Houston, MD;  [facility];  Service: Endoscopy;;  gastric  . COLONOSCOPY    . COLONOSCOPY N/A 05/24/2018   Procedure: COLONOSCOPY;  Surgeon: Rogene Houston, MD;  [facility]: [facility];  Service: Endoscopy;  Laterality: N/A;  8:55  . COLONOSCOPY WITH ESOPHAGOGASTRODUODENOSCOPY (EGD)    . CORONARY STENT INTERVENTION N/A 01/26/2019   Procedure: CORONARY STENT INTERVENTION;  Surgeon: Troy Sine, MD;  Location: [place] [facility];  Service: Cardiovascular;  Laterality: N/A;  . CORONARY/GRAFT ACUTE MI REVASCULARIZATION N/A 01/26/2019   Procedure: Coronary/Graft Acute MI Revascularization;  Surgeon: Troy Sine, MD;  Location: [place] [facility];  Service: Cardiovascular;  Laterality: N/A;  . ESOPHAGOGASTRODUODENOSCOPY N/A 05/24/2018   Procedure: ESOPHAGOGASTRODUODENOSCOPY (EGD);  Surgeon: Rogene Houston, MD;  [facility];  Service: Endoscopy;  Laterality: N/A;  . LEFT HEART CATH AND CORONARY ANGIOGRAPHY N/A 01/26/2019   Procedure: LEFT HEART CATH AND CORONARY ANGIOGRAPHY;  Surgeon: Troy Sine, MD;  Location: [place] [facility];  Service: Cardiovascular;  Laterality: N/A;  . MEDIAL PARTIAL KNEE REPLACEMENT     2015  . POLYPECTOMY  05/24/2018   Procedure: POLYPECTOMY;  Surgeon: Rogene Houston, MD;  [facility];  Service: Endoscopy;;  colon  . rt hand injury    .  shoulder surgery for bursitis    . TONSILLECTOMY       reports that he has been smoking cigarettes. He has a 10.00 pack-year smoking history. He has never used smokeless tobacco. He reports current alcohol use. He reports that he does not use drugs.  No Known Allergies  Family History  Problem Relation Age of Onset  . Colon cancer Neg Hx      Prior to Admission medications    Medication Sig Start Date End Date Taking? Authorizing Provider  acetaminophen (TYLENOL) 500 MG tablet Take 500-1,000 mg by mouth daily as needed for mild pain or moderate pain.   Yes [provider]  albuterol (VENTOLIN HFA) 108 (90 Base) MCG/ACT inhaler Inhale 2 puffs into the lungs every 6 (six) hours as needed for wheezing or shortness of breath. 06/23/19  Yes Olalere, Adewale A, MD  ARIPiprazole (ABILIFY) 10 MG tablet Take 10 mg by mouth every evening. 12/29/18  Yes [provider]  aspirin 81 MG chewable tablet Chew 1 tablet (81 mg total) by mouth daily. Patient taking differently: Chew 81 mg by mouth every morning.  01/31/19  Yes Almyra Deforest, PA  atorvastatin (LIPITOR) 80 MG tablet Take 1 tablet (80 mg total) by mouth daily at 6 PM. 01/30/19  Yes Almyra Deforest, PA  buPROPion (WELLBUTRIN SR) 150 MG 12 hr tablet Take 150-300 mg by mouth See admin instructions. Take two tablets (300 mg) by mouth every morning and one tablet (150 mg) with lunch 03/27/18  Yes [provider]  carvedilol (COREG) 3.125 MG tablet Take 1 tablet (3.125 mg total) by mouth 2 (two) times daily with a meal. 01/30/19  Yes Almyra Deforest, PA  clopidogrel (PLAVIX) 75 MG tablet Take 1 tablet (75 mg total) by mouth daily. 01/31/19  Yes Almyra Deforest, PA  escitalopram (LEXAPRO) 20 MG tablet Take 20 mg by mouth every morning.    Yes [provider]  furosemide (LASIX) 20 MG tablet Take 1 tablet (20 mg total) by mouth as needed. Patient taking differently: Take 20 mg by mouth every morning.  05/17/19  Yes Lendon Colonel, NP  levothyroxine (SYNTHROID) 125 MCG tablet Take 125 mcg by mouth daily before breakfast.    Yes [provider]  Multiple Vitamin (MULTIVITAMIN WITH MINERALS) TABS tablet Take 1 tablet by mouth every morning.   Yes [provider]  nitroGLYCERIN (NITROSTAT) 0.4 MG SL tablet Place 1 tablet (0.4 mg total) under the tongue every 5 (five) minutes x 3 doses as needed for chest pain.  01/30/19  Yes Almyra Deforest, PA  rivaroxaban (XARELTO) 20 MG TABS tablet Take 1 tablet (20 mg total) by mouth daily. 01/31/19  Yes Meng, Isaac Laud, PA  sacubitril-valsartan (ENTRESTO) 24-26 MG Take 1 tablet by mouth 2 (two) times daily. 06/01/19  Yes Troy Sine, MD  spironolactone (ALDACTONE) 25 MG tablet Take 1 tablet (25 mg total) by mouth daily. 01/31/19  Yes Almyra Deforest, [place]    Physical Exam: Vitals:   06/24/19 2004 06/24/19 2005 06/24/19 2030 06/24/19 2145  BP:  105/68 95/61 (!) 92/59  Pulse:  100 92 83  Resp:  (!) 26 20 (!) 22  Temp:  98.3 F (36.8 C)    TempSrc:  Oral    SpO2: (!) 89% 92% 92% 93%    Constitutional: NAD, calm  Eyes: PERTLA, lids and conjunctivae normal ENMT: Mucous membranes are moist. Posterior pharynx clear of any exudate or lesions.   Neck: normal, supple, no masses, no thyromegaly Respiratory:  fine rales bilaterally, no wheezing. Tachypnea. No accessory muscle use.  Cardiovascular: S1 & S2 heard, regular rate and rhythm. Bilateral pedal edema. Abdomen: No distension, no tenderness, soft. Bowel sounds active.  Musculoskeletal: no clubbing / cyanosis. No joint deformity upper and lower extremities.   Skin: no significant rashes, lesions, ulcers. Warm, dry, well-perfused. Neurologic: CN 2-12 grossly intact. Sensation intact. Strength 5/5 in all 4 limbs.  Psychiatric: Alert and oriented to person, place, and situation. Calm, cooperative.    Labs on Admission: I have personally reviewed following labs and imaging studies  CBC: Recent Labs  Lab 06/24/19 2005  WBC 6.6  NEUTROABS 4.3  HGB 13.6  HCT 41.9  MCV 89.1  PLT 219   Basic Metabolic Panel: Recent Labs  Lab 06/24/19 2005  NA 141  K 3.9  CL 110  CO2 19*  GLUCOSE 144*  BUN 13  CREATININE 1.23  CALCIUM 9.2  MG 1.8   GFR: CrCl cannot be calculated (Unknown ideal weight.). Liver Function Tests: Recent Labs  Lab 06/24/19 2005  AST 22  ALT 22  ALKPHOS 68  BILITOT 1.2  PROT 6.5  ALBUMIN 3.7    Recent Labs  Lab 06/24/19 2005  LIPASE 25   No results for input(s): AMMONIA in the last 168 hours. Coagulation Profile: No results for input(s): INR, PROTIME in the last 168 hours. Cardiac Enzymes: No results for input(s): CKTOTAL, CKMB, CKMBINDEX, TROPONINI in the last 168 hours. BNP (last 3 results) No results for input(s): PROBNP in the last 8760 hours. HbA1C: No results for input(s): HGBA1C in the last 72 hours. CBG: No results for input(s): GLUCAP in the last 168 hours. Lipid Profile: No results for input(s): CHOL, HDL, LDLCALC, TRIG, CHOLHDL, LDLDIRECT in the last 72 hours. Thyroid Function Tests: No results for input(s): TSH, T4TOTAL, FREET4, T3FREE, THYROIDAB in the last 72 hours. Anemia Panel: No results for input(s): VITAMINB12, FOLATE, FERRITIN, TIBC, IRON, RETICCTPCT in the last 72 hours. Urine analysis: No results found for: COLORURINE, APPEARANCEUR, LABSPEC, PHURINE, GLUCOSEU, HGBUR, BILIRUBINUR, KETONESUR, PROTEINUR, UROBILINOGEN, NITRITE, LEUKOCYTESUR Sepsis Labs: @LABRCNTIP (procalcitonin:4,lacticidven:4) ) Recent Results (from the past 240 hour(s))  SARS Coronavirus 2 [facility] order, Performed in [facility] hospital lab) Nasopharyngeal Nasopharyngeal Swab     Status: None   Collection Time: 06/24/19  8:17 PM   Specimen: Nasopharyngeal Swab  Result Value Ref Range Status   SARS Coronavirus 2 NEGATIVE NEGATIVE Final    Comment: (NOTE) If result is NEGATIVE SARS-CoV-2 target nucleic acids are NOT DETECTED. The SARS-CoV-2 RNA is generally detectable in upper and lower  respiratory specimens during the acute phase of infection. The lowest  concentration of SARS-CoV-2 viral copies this assay can detect is 250  copies / mL. A negative result does not preclude SARS-CoV-2 infection  and should not be used as the sole basis for treatment or other  patient management decisions.  A negative result may occur with  improper specimen collection / handling, submission  of specimen other  than nasopharyngeal swab, presence of viral mutation(s) within the  areas targeted by this assay, and inadequate number of viral copies  (<250 copies / mL). A negative result must be combined with clinical  observations, patient history, and epidemiological information. If result is POSITIVE SARS-CoV-2 target nucleic acids are DETECTED. The SARS-CoV-2 RNA is generally detectable in upper and lower  respiratory specimens dur ing the acute phase of infection.  Positive  results are indicative of active infection with SARS-CoV-2.  Clinical  correlation with patient history  and other diagnostic information is  necessary to determine patient infection status.  Positive results do  not rule out bacterial infection or co-infection with other viruses. If result is PRESUMPTIVE POSTIVE SARS-CoV-2 nucleic acids MAY BE PRESENT.   A presumptive positive result was obtained on the submitted specimen  and confirmed on repeat testing.  While 2019 novel coronavirus  (SARS-CoV-2) nucleic acids may be present in the submitted sample  additional confirmatory testing may be necessary for epidemiological  and / or clinical management purposes  to differentiate between  SARS-CoV-2 and other Sarbecovirus currently known to infect humans.  If clinically indicated additional testing with an alternate test  methodology [phone]) is advised. The SARS-CoV-2 RNA is generally  detectable in upper and lower respiratory sp ecimens during the acute  phase of infection. The expected result is Negative. Fact Sheet for Patients:  StrictlyIdeas.no Fact Sheet for Healthcare Providers: BankingDealers.co.za This test is not yet approved or cleared by the [place] FDA and has been authorized for detection and/or diagnosis of SARS-CoV-2 by FDA under an Emergency Use Authorization (EUA).  This EUA will remain in effect (meaning this test can be used) for  the duration of the COVID-19 declaration under Section 564(b)(1) of the Act, 21 U.S.C. section 360bbb-3(b)(1), unless the authorization is terminated or revoked sooner. Performed at [facility], [place] [address] [place], [place] [address]      Radiological Exams on Admission: Dg Chest Portable 1 View  Result Date: 06/24/2019 CLINICAL DATA:  Shortness of breath, cough and fatigue EXAM: PORTABLE CHEST 1 VIEW COMPARISON:  05/17/2019 FINDINGS: Stable cardiomegaly. Diffuse bilateral interstitial opacity. Possible tiny pleural effusions. Aortic atherosclerosis. No pneumothorax. IMPRESSION: 1. Development of diffuse bilateral interstitial opacity, suspect for acute interstitial inflammatory process, possible atypical or viral infection 2. Cardiomegaly.  Possible small pleural effusions. Electronically Signed   By: Donavan Foil M.D.   On: 06/24/2019 20:49    EKG: Independently reviewed. Sinus rhythm, non-specific IVCD, LVH, QTc 545 ms.   Assessment/Plan   1. Acute on chronic systolic CHF  - Presents with SOB and ankle edema, found to have increased BNP, CXR with diffuse interstitial opacity that could also reflect atypical infection but there has not been any f/c or sick contacts and COVID is negative  - EF was 20% in July 2020  - Treated with Lasix 20 mg IV in ED  - Continue diuresis with Lasix 20 mg IV q12h, follow daily wt and I/O's, continue Entresto and Coreg, continue Aldactone, check procalcitonin, sputum culture, and strep pneumo and legionella antigens, monitor electrolytes and renal function during diuresis   2. CAD  - No chest pain - High-sensitivity troponin wnl in ED  - Continue DAPT, statin, beta-blocker    3. COPD  - No wheezing on admission and albuterol held in light of prolonged QT    4. Prolonged QT interval  - QTc is 545 ms in ED  - Continue cardiac monitoring, minimize QT-prolonging medications, replace potassium to 4 and mag to 2, repeat EKG in am   5.  Depression   - Hold Abilify and Lexapro in light of prolonged QT    6. Hypothyroid  - Continue Synthroid    PPE: Mask, face shield  DVT prophylaxis: Xarelto  Code Status: Full  Family Communication: Discussed with patient  Consults called: None Admission status: Observation     Vianne Bulls, MD [facility] Pager [phone]  If 7PM-7AM, please contact night-coverage [email or web address] Password [facility]  06/24/2019, 11:41  PM    

## 2019-06-24 NOTE — ED Triage Notes (Signed)
Patient arrived via [facility].  Patient was sitting on his couch watching TV when he complained of extreme SOB un relieved by 2 nitroglycerin.   Patient has a history of STEMI this past March. Stents were placed with no signs of blockages per patient.

## 2019-06-25 ENCOUNTER — Other Ambulatory Visit: Payer: Self-pay

## 2019-06-25 DIAGNOSIS — R0602 Shortness of breath: Secondary | ICD-10-CM | POA: Diagnosis not present

## 2019-06-25 DIAGNOSIS — I5023 Acute on chronic systolic (congestive) heart failure: Secondary | ICD-10-CM | POA: Diagnosis not present

## 2019-06-25 DIAGNOSIS — I110 Hypertensive heart disease with heart failure: Secondary | ICD-10-CM | POA: Diagnosis not present

## 2019-06-25 DIAGNOSIS — E039 Hypothyroidism, unspecified: Secondary | ICD-10-CM | POA: Diagnosis not present

## 2019-06-25 LAB — BASIC METABOLIC PANEL
Anion gap: 10 (ref 5–15)
BUN: 13 mg/dL (ref 8–23)
CO2: 22 mmol/L (ref 22–32)
Calcium: 9.3 mg/dL (ref 8.9–10.3)
Chloride: 109 mmol/L (ref 98–111)
Creatinine, Ser: 1.19 mg/dL (ref 0.61–1.24)
GFR calc Af Amer: >60 mL/min (ref >60)
GFR calc non Af Amer: >60 mL/min (ref >60)
Glucose, Bld: 102 mg/dL — ABNORMAL HIGH (ref 70–99)
Potassium: 4.2 mmol/L (ref 3.5–5.1)
Sodium: 141 mmol/L (ref 135–145)

## 2019-06-25 LAB — LACTIC ACID, PLASMA: Lactic Acid, Venous: 1.4 mmol/L (ref 0.5–1.9)

## 2019-06-25 LAB — STREP PNEUMONIAE URINARY ANTIGEN: Strep Pneumo Urinary Antigen: NEGATIVE

## 2019-06-25 LAB — PROCALCITONIN: Procalcitonin: <0.1 ng/mL

## 2019-06-25 LAB — MAGNESIUM: Magnesium: 2.3 mg/dL (ref 1.7–2.4)

## 2019-06-25 LAB — TROPONIN I (HIGH SENSITIVITY)
Troponin I (High Sensitivity): 34 ng/L — ABNORMAL HIGH (ref <18)
Troponin I (High Sensitivity): 28 ng/L — ABNORMAL HIGH (ref <18)

## 2019-06-25 MED ORDER — PNEUMOCOCCAL VAC POLYVALENT 25 MCG/0.5ML IJ INJ: 0.5000 mL | INJECTION | INTRAMUSCULAR | Status: DC

## 2019-06-25 MED ORDER — ACETAMINOPHEN 325 MG PO TABS: 650.0000 mg | ORAL_TABLET | ORAL | Status: DC | PRN

## 2019-06-25 MED ORDER — RIVAROXABAN 20 MG PO TABS: 20.0000 mg | ORAL_TABLET | Freq: Every day | ORAL | Status: DC

## 2019-06-25 MED ORDER — BUPROPION HCL ER (SR) 150 MG PO TB12
300.0000 mg | ORAL_TABLET | Freq: Every day | ORAL | Status: DC
  Administered 2019-06-25: 300 mg via ORAL
  Filled 2019-06-25: qty 2

## 2019-06-25 MED ORDER — FUROSEMIDE 20 MG PO TABS: 20.0000 mg | ORAL_TABLET | Freq: Every morning | ORAL | Status: DC

## 2019-06-25 MED ORDER — ONDANSETRON HCL 4 MG/2ML IJ SOLN: 4.0000 mg | Freq: Four times a day (QID) | INTRAMUSCULAR | Status: DC | PRN

## 2019-06-25 MED ORDER — SODIUM CHLORIDE 0.9 % IV SOLN: 250.0000 mL | INTRAVENOUS | Status: DC | PRN

## 2019-06-25 MED ORDER — LEVOTHYROXINE SODIUM 25 MCG PO TABS
125.0000 ug | ORAL_TABLET | Freq: Every day | ORAL | Status: DC
  Administered 2019-06-25: 125 ug via ORAL
  Filled 2019-06-25: qty 1

## 2019-06-25 MED ORDER — BUPROPION HCL ER (SR) 150 MG PO TB12
150.0000 mg | ORAL_TABLET | Freq: Every day | ORAL | Status: DC
  Administered 2019-06-25: 150 mg via ORAL
  Filled 2019-06-25: qty 1

## 2019-06-25 MED ORDER — SODIUM CHLORIDE 0.9% FLUSH
3.0000 mL | Freq: Two times a day (BID) | INTRAVENOUS | Status: DC
  Administered 2019-06-25 (×2): 3 mL via INTRAVENOUS

## 2019-06-25 MED ORDER — SODIUM CHLORIDE 0.9% FLUSH: 3.0000 mL | INTRAVENOUS | Status: DC | PRN

## 2019-06-25 NOTE — Discharge Instructions (Addendum)

## 2019-06-25 NOTE — Plan of Care (Signed)

## 2019-06-25 NOTE — Progress Notes (Signed)
Admit pt from ed, pt is stable, mild sob when exertion,  Better when rest, v/s stable, refused any pain, assessment, disscussed care plan, equiptment and policy, verbalized understanding, rest in bed

## 2019-06-25 NOTE — Discharge Summary (Signed)
Physician Discharge Summary  Tony Alvarez HYI:[id number] DOB: 1956-08-06 DOA: 06/24/2019  PCP: [facility]  Admit date: 06/24/2019 Discharge date: 06/25/2019  Admitted From: home Discharge disposition: home   Recommendations for Outpatient Follow-Up:   1. Daily weights 2. Fluid restriction   Discharge Diagnosis:   Principal Problem:   Acute on chronic systolic CHF (congestive heart failure) (HCC) Active Problems:   Barrett's esophagus without dysplasia   LV (left ventricular) mural thrombus with acute MI (HCC)   Alcohol abuse   Ischemic cardiomyopathy   CAD (coronary artery disease)   Hypothyroid   COPD (chronic obstructive pulmonary disease) (HCC)   Prolonged QT interval   Depression    Discharge Condition: Improved.  Diet recommendation: Low sodium, heart healthy.   Wound care: None.  Code status: Full.   History of Present Illness:   Tony Alvarez is a 63 y.o. male with medical history significant for coronary artery disease with STEMI in March treated with DES to LAD, ischemic cardiomyopathy with EF 25% in July 2020, alcohol abuse in remission, OSA, depression, and hypothyroidism, now presenting to the emergency department for evaluation of shortness of breath.  Patient reports that he recently developed some bilateral ankle swelling, has had a mild nonproductive cough, was more short of breath than usual yesterday, and then had worsening dyspnea today that became severe.  He has not been having any chest pain, but with the acute onset severe shortness of breath, he took 2 doses of nitroglycerin without any appreciable change.  He called EMS and was brought into the ED.  Patient denies any fevers or chills.  Reports that he quit drinking after the heart attack in March.  Denies sick contacts.   Hospital Course by Problem:   1. Acute on chronic systolic CHF  - Presents with SOB and ankle edema, found to have increased BNP, CXR with diffuse  interstitial opacity - EF was 20% in July 2020  - Treated with Lasix 20 mg IV and had good response - Continue diuresis with Lasix 20 mg IV q12h, follow daily wt and I/O's, continue Entresto and Coreg, continue Aldactone Per patient he was not fluid restricting nor weighting daily No need for O2  2. CAD  - No chest pain - Continue DAPT, statin, beta-blocker    3. COPD  - No wheezing on admission and albuterol held in light of prolonged QT    4. Prolonged QT interval  - QTc is 545 ms in ED  - Continue cardiac monitoring, minimize QT-prolonging medications, replace potassium to 4 and mag to 2  5. Depression   - Hold Abilify and Lexapro in light of prolonged QT -outpatient EKG prior to resumption    6. Hypothyroid  - Continue Synthroid      Medical Consultants:      Discharge Exam:   Vitals:   06/25/19 0834 06/25/19 0933  BP: (!) 95/57 94/64  Pulse: 79 79  Resp: 18   Temp: 98 F (36.7 C)   SpO2: 92% 96%   Vitals:   06/25/19 0149 06/25/19 0521 06/25/19 0834 06/25/19 0933  BP: 100/70 105/73 (!) 95/57 94/64  Pulse: 74 79 79 79  Resp: 18 18 18    Temp: 97.7 F (36.5 C) 97.8 F (36.6 C) 98 F (36.7 C)   TempSrc: Oral Oral Oral   SpO2: 97% 90% 92% 96%  Weight: 107.6 kg     Height: 5\' 10"  (1.778 m)  General exam: Appears calm and comfortable. No wheezing, no crackles The results of significant diagnostics from this hospitalization (including imaging, microbiology, ancillary and laboratory) are listed below for reference.     Procedures and Diagnostic Studies:   Dg Chest Portable 1 View  Result Date: 06/24/2019 CLINICAL DATA:  Shortness of breath, cough and fatigue EXAM: PORTABLE CHEST 1 VIEW COMPARISON:  05/17/2019 FINDINGS: Stable cardiomegaly. Diffuse bilateral interstitial opacity. Possible tiny pleural effusions. Aortic atherosclerosis. No pneumothorax. IMPRESSION: 1. Development of diffuse bilateral interstitial opacity, suspect for acute  interstitial inflammatory process, possible atypical or viral infection 2. Cardiomegaly.  Possible small pleural effusions. Electronically Signed   By: Donavan Foil M.D.   On: 06/24/2019 20:49     Labs:   Basic Metabolic Panel: Recent Labs  Lab 06/24/19 2005 06/25/19 0731  NA 141 141  K 3.9 4.2  CL 110 109  CO2 19* 22  GLUCOSE 144* 102*  BUN 13 13  CREATININE 1.23 1.19  CALCIUM 9.2 9.3  MG 1.8 2.3   GFR Estimated Creatinine Clearance: 80 mL/min (by C-G formula based on SCr of 1.19 mg/dL). Liver Function Tests: Recent Labs  Lab 06/24/19 2005  AST 22  ALT 22  ALKPHOS 68  BILITOT 1.2  PROT 6.5  ALBUMIN 3.7   Recent Labs  Lab 06/24/19 2005  LIPASE 25   No results for input(s): AMMONIA in the last 168 hours. Coagulation profile No results for input(s): INR, PROTIME in the last 168 hours.  CBC: Recent Labs  Lab 06/24/19 2005  WBC 6.6  NEUTROABS 4.3  HGB 13.6  HCT 41.9  MCV 89.1  PLT 176   Cardiac Enzymes: No results for input(s): CKTOTAL, CKMB, CKMBINDEX, TROPONINI in the last 168 hours. BNP: Invalid input(s): POCBNP CBG: No results for input(s): GLUCAP in the last 168 hours. D-Dimer Recent Labs    06/24/19 2005  DDIMER 0.29   Hgb A1c No results for input(s): HGBA1C in the last 72 hours. Lipid Profile No results for input(s): CHOL, HDL, LDLCALC, TRIG, CHOLHDL, LDLDIRECT in the last 72 hours. Thyroid function studies No results for input(s): TSH, T4TOTAL, T3FREE, THYROIDAB in the last 72 hours.  Invalid input(s): FREET3 Anemia work up No results for input(s): VITAMINB12, FOLATE, FERRITIN, TIBC, IRON, RETICCTPCT in the last 72 hours. Microbiology Recent Results (from the past 240 hour(s))  SARS Coronavirus 2 [facility] order, Performed in [facility] hospital lab) Nasopharyngeal Nasopharyngeal Swab     Status: None   Collection Time: 06/24/19  8:17 PM   Specimen: Nasopharyngeal Swab  Result Value Ref Range Status   SARS Coronavirus 2 NEGATIVE  NEGATIVE Final    Comment: (NOTE) If result is NEGATIVE SARS-CoV-2 target nucleic acids are NOT DETECTED. The SARS-CoV-2 RNA is generally detectable in upper and lower  respiratory specimens during the acute phase of infection. The lowest  concentration of SARS-CoV-2 viral copies this assay can detect is 250  copies / mL. A negative result does not preclude SARS-CoV-2 infection  and should not be used as the sole basis for treatment or other  patient management decisions.  A negative result may occur with  improper specimen collection / handling, submission of specimen other  than nasopharyngeal swab, presence of viral mutation(s) within the  areas targeted by this assay, and inadequate number of viral copies  (<250 copies / mL). A negative result must be combined with clinical  observations, patient history, and epidemiological information. If result is POSITIVE SARS-CoV-2 target nucleic acids are DETECTED. The SARS-CoV-2  RNA is generally detectable in upper and lower  respiratory specimens dur ing the acute phase of infection.  Positive  results are indicative of active infection with SARS-CoV-2.  Clinical  correlation with patient history and other diagnostic information is  necessary to determine patient infection status.  Positive results do  not rule out bacterial infection or co-infection with other viruses. If result is PRESUMPTIVE POSTIVE SARS-CoV-2 nucleic acids MAY BE PRESENT.   A presumptive positive result was obtained on the submitted specimen  and confirmed on repeat testing.  While 2019 novel coronavirus  (SARS-CoV-2) nucleic acids may be present in the submitted sample  additional confirmatory testing may be necessary for epidemiological  and / or clinical management purposes  to differentiate between  SARS-CoV-2 and other Sarbecovirus currently known to infect humans.  If clinically indicated additional testing with an alternate test  methodology [phone]) is  advised. The SARS-CoV-2 RNA is generally  detectable in upper and lower respiratory sp ecimens during the acute  phase of infection. The expected result is Negative. Fact Sheet for Patients:  StrictlyIdeas.no Fact Sheet for Healthcare Providers: BankingDealers.co.za This test is not yet approved or cleared by the [place] FDA and has been authorized for detection and/or diagnosis of SARS-CoV-2 by FDA under an Emergency Use Authorization (EUA).  This EUA will remain in effect (meaning this test can be used) for the duration of the COVID-19 declaration under Section 564(b)(1) of the Act, 21 U.S.C. section 360bbb-3(b)(1), unless the authorization is terminated or revoked sooner. Performed at [facility], [place] [address] [place], [place] [address]      Discharge Instructions:   Discharge Instructions    (HEART FAILURE PATIENTS) Call MD:  Anytime you have any of the following symptoms: 1) 3 pound weight gain in 24 hours or 5 pounds in 1 week 2) shortness of breath, with or without a dry hacking cough 3) swelling in the hands, feet or stomach 4) if you have to sleep on extra pillows at night in order to breathe.   Complete by: As directed    Diet - low sodium heart healthy   Complete by: As directed    Discharge instructions   Complete by: As directed    Part of the electrical activity in your heart (QTc) is longer than normal-- some of your medications can affect this-- Abilify and lexapro-- please hold these for now and follow up with PCP for repeat EKG and possible resumption of these medicaitons   Heart Failure patients record your daily weight using the same scale at the same time of day   Complete by: As directed    Increase activity slowly   Complete by: As directed      Allergies as of 06/25/2019   No Known Allergies     Medication List    STOP taking these medications   ARIPiprazole 10 MG tablet Commonly known  as: ABILIFY   escitalopram 20 MG tablet Commonly known as: LEXAPRO     TAKE these medications   acetaminophen 500 MG tablet Commonly known as: TYLENOL Take 500-1,000 mg by mouth daily as needed for mild pain or moderate pain.   albuterol 108 (90 Base) MCG/ACT inhaler Commonly known as: VENTOLIN HFA Inhale 2 puffs into the lungs every 6 (six) hours as needed for wheezing or shortness of breath.   aspirin 81 MG chewable tablet Chew 1 tablet (81 mg total) by mouth daily. What changed: when to take this   atorvastatin 80  MG tablet Commonly known as: LIPITOR Take 1 tablet (80 mg total) by mouth daily at 6 PM.   buPROPion 150 MG 12 hr tablet Commonly known as: WELLBUTRIN SR Take 150-300 mg by mouth See admin instructions. Take two tablets (300 mg) by mouth every morning and one tablet (150 mg) with lunch   carvedilol 3.125 MG tablet Commonly known as: COREG Take 1 tablet (3.125 mg total) by mouth 2 (two) times daily with a meal.   clopidogrel 75 MG tablet Commonly known as: PLAVIX Take 1 tablet (75 mg total) by mouth daily.   Entresto 24-26 MG Generic drug: sacubitril-valsartan Take 1 tablet by mouth 2 (two) times daily.   furosemide 20 MG tablet Commonly known as: LASIX Take 1 tablet (20 mg total) by mouth every morning.   levothyroxine 125 MCG tablet Commonly known as: SYNTHROID Take 125 mcg by mouth daily before breakfast.   multivitamin with minerals Tabs tablet Take 1 tablet by mouth every morning.   nitroGLYCERIN 0.4 MG SL tablet Commonly known as: NITROSTAT Place 1 tablet (0.4 mg total) under the tongue every 5 (five) minutes x 3 doses as needed for chest pain.   rivaroxaban 20 MG Tabs tablet Commonly known as: XARELTO Take 1 tablet (20 mg total) by mouth daily.   spironolactone 25 MG tablet Commonly known as: ALDACTONE Take 1 tablet (25 mg total) by mouth daily.      Follow-up Information    A, [facility] Follow up in 1 week(s).    Why: needs EKG Contact information: [place] [address] [phone]        Troy Sine, MD .   Specialty: Cardiology Contact information: [address] [place] [address] [phone]            Time coordinating discharge: 25 min  Signed:  Geradine Girt DO  [facility] 06/25/2019, 11:46 AM

## 2019-06-25 NOTE — Progress Notes (Signed)
SATURATION QUALIFICATIONS: (This note is used to comply with regulatory documentation for home oxygen)  Patient Saturations on Room Air at Rest = 95%  Patient Saturations on Room Air while Ambulating = 94%  Patient Saturations on 0 Liters of oxygen while Ambulating = 94% 

## 2019-06-25 NOTE — ED Notes (Signed)
ED TO INPATIENT HANDOFF REPORT  ED Nurse Name and Phone #: [phone] Tony Alvarez  S Name/Age/Gender Tony Alvarez 63 y.o. male Room/Bed: 015C/015C  Code Status   Code Status: Prior  Home/SNF/Other Home Patient oriented to: self, place, time and situation Is this baseline? Yes   Triage Complete: Triage complete  Chief Complaint STEMI  Triage Note Patient arrived via [facility].  Patient was sitting on his couch watching TV when he complained of extreme SOB un relieved by 2 nitroglycerin.   Patient has a history of STEMI this past March. Stents were placed with no signs of blockages per patient.    Allergies No Known Allergies  Level of Care/Admitting Diagnosis ED Disposition    ED Disposition Condition [facility] Area: [place] [[id number]]  Level of Care: Telemetry Cardiac [103]  I expect the patient will be discharged within 24 hours: Yes  LOW acuity---Tx typically complete <24 hrs---ACUTE conditions typically can be evaluated <24 hours---LABS likely to return to acceptable levels <24 hours---IS near functional baseline---EXPECTED to return to current living arrangement---NOT newly hypoxic: Does not meet criteria for 5C-Observation unit  Covid Evaluation: Confirmed COVID Negative  Diagnosis: Acute on chronic systolic CHF (congestive heart failure) [facility]) [[id number]]  Admitting Physician: Vianne Bulls [[id number]]  Attending Physician: Vianne Bulls [[id number]]  PT Class (Do Not Modify): Observation [104]  PT Acc Code (Do Not Modify): Observation [10022]       B Medical/Surgery History Past Medical History:  Diagnosis Date  . Barrett's esophagus 04/21/2018  . Depression   . High cholesterol   . Hypertension   . Hypothyroid   . Sleep apnea    wears CPAP at home   Past Surgical History:  Procedure Laterality Date  . BIOPSY  05/24/2018   Procedure: BIOPSY;  Surgeon: Rogene Houston, MD;  [facility];  Service: Endoscopy;;  gastric   . COLONOSCOPY    . COLONOSCOPY N/A 05/24/2018   Procedure: COLONOSCOPY;  Surgeon: Rogene Houston, MD;  [facility];  Service: Endoscopy;  Laterality: N/A;  8:55  . COLONOSCOPY WITH ESOPHAGOGASTRODUODENOSCOPY (EGD)    . CORONARY STENT INTERVENTION N/A 01/26/2019   Procedure: CORONARY STENT INTERVENTION;  Surgeon: Troy Sine, MD;  Location: [place] [facility];  Service: Cardiovascular;  Laterality: N/A;  . CORONARY/GRAFT ACUTE MI REVASCULARIZATION N/A 01/26/2019   Procedure: Coronary/Graft Acute MI Revascularization;  Surgeon: Troy Sine, MD;  Location: [place] [facility];  Service: Cardiovascular;  Laterality: N/A;  . ESOPHAGOGASTRODUODENOSCOPY N/A 05/24/2018   Procedure: ESOPHAGOGASTRODUODENOSCOPY (EGD);  Surgeon: Rogene Houston, MD;  [facility];  Service: Endoscopy;  Laterality: N/A;  . LEFT HEART CATH AND CORONARY ANGIOGRAPHY N/A 01/26/2019   Procedure: LEFT HEART CATH AND CORONARY ANGIOGRAPHY;  Surgeon: Troy Sine, MD;  Location: [place] [facility] LAB;  Service: Cardiovascular;  Laterality: N/A;  . MEDIAL PARTIAL KNEE REPLACEMENT     2015  . POLYPECTOMY  05/24/2018   Procedure: POLYPECTOMY;  Surgeon: Rogene Houston, MD;  [facility];  Service: Endoscopy;;  colon  . rt hand injury    . shoulder surgery for bursitis    . TONSILLECTOMY       A IV Location/Drains/Wounds Patient Lines/Drains/Airways Status   Active Line/Drains/Airways    Name:   Placement date:   Placement time:   Site:   Days:   Peripheral IV 06/24/19 Left Antecubital   06/24/19    2108  Antecubital   1          Intake/Output Last 24 hours No intake or output data in the 24 hours ending 06/25/19 0030  Labs/Imaging Results for orders placed or performed during the hospital encounter of 06/24/19 (from the past 48 hour(s))  CBC with Differential     Status: Abnormal   Collection Time: 06/24/19  8:05 PM  Result Value Ref Range   WBC 6.6 4.0 - 10.5 K/uL   RBC  4.70 4.22 - 5.81 MIL/uL   Hemoglobin 13.6 13.0 - 17.0 g/dL   HCT 41.9 39.0 - 52.0 %   MCV 89.1 80.0 - 100.0 fL   MCH 28.9 26.0 - 34.0 pg   MCHC 32.5 30.0 - 36.0 g/dL   RDW 17.9 (H) 11.5 - 15.5 %   Platelets 176 150 - 400 K/uL   nRBC 0.0 0.0 - 0.2 %   Neutrophils Relative % 65 %   Neutro Abs 4.3 1.7 - 7.7 K/uL   Lymphocytes Relative 25 %   Lymphs Abs 1.7 0.7 - 4.0 K/uL   Monocytes Relative 5 %   Monocytes Absolute 0.3 0.1 - 1.0 K/uL   Eosinophils Relative 4 %   Eosinophils Absolute 0.3 0.0 - 0.5 K/uL   Basophils Relative 1 %   Basophils Absolute 0.1 0.0 - 0.1 K/uL   Immature Granulocytes 0 %   Abs Immature Granulocytes 0.02 0.00 - 0.07 K/uL    Comment: Performed at [facility], [address], [place], [place] [address]  Comprehensive metabolic panel     Status: Abnormal   Collection Time: 06/24/19  8:05 PM  Result Value Ref Range   Sodium 141 135 - 145 mmol/L   Potassium 3.9 3.5 - 5.1 mmol/L   Chloride 110 98 - 111 mmol/L   CO2 19 (L) 22 - 32 mmol/L   Glucose, Bld 144 (H) 70 - 99 mg/dL   BUN 13 8 - 23 mg/dL   Creatinine, Ser 1.23 0.61 - 1.24 mg/dL   Calcium 9.2 8.9 - 10.3 mg/dL   Total Protein 6.5 6.5 - 8.1 g/dL   Albumin 3.7 3.5 - 5.0 g/dL   AST 22 15 - 41 U/L   ALT 22 0 - 44 U/L   Alkaline Phosphatase 68 38 - 126 U/L   Total Bilirubin 1.2 0.3 - 1.2 mg/dL   GFR calc non Af Amer >60 >60 mL/min   GFR calc Af Amer >60 >60 mL/min   Anion gap 12 5 - 15    Comment: Performed at [facility] [address] [place], [place] [address]  Troponin I (High Sensitivity)     Status: None   Collection Time: 06/24/19  8:05 PM  Result Value Ref Range   Troponin I (High Sensitivity) 17 <18 ng/L    Comment: (NOTE) Elevated high sensitivity troponin I (hsTnI) values and significant  changes across serial measurements may suggest ACS but many other  chronic and acute conditions are known to elevate hsTnI results.  Refer to the "Links" section for chest pain algorithms and  additional  guidance. Performed at [facility], [place] [address], [place], [place] [address]   Lipase, blood     Status: None   Collection Time: 06/24/19  8:05 PM  Result Value Ref Range   Lipase 25 11 - 51 U/L    Comment: Performed at [facility] [address], [place],  [address]  Magnesium     Status:  None   Collection Time: 06/24/19  8:05 PM  Result Value Ref Range   Magnesium 1.8 1.7 - 2.4 mg/dL    Comment: Performed at [facility], [place] [address], [place], [place] [address]  D-dimer, quantitative (not at [facility])     Status: None   Collection Time: 06/24/19  8:05 PM  Result Value Ref Range   D-Dimer, Quant 0.29 0.00 - 0.50 ug/mL-FEU    Comment: (NOTE) At the manufacturer cut-off of 0.50 ug/mL FEU, this assay has been documented to exclude PE with a sensitivity and negative predictive value of 97 to 99%.  At this time, this assay has not been approved by the FDA to exclude DVT/VTE. Results should be correlated with clinical presentation. Performed at [facility], [place] [address] [place], [place] [address]   Lactic acid, plasma     Status: None   Collection Time: 06/24/19  8:06 PM  Result Value Ref Range   Lactic Acid, Venous 1.3 0.5 - 1.9 mmol/L    Comment: Performed at [facility] [address] [place], [place] [address]  Brain natriuretic peptide     Status: Abnormal   Collection Time: 06/24/19  8:06 PM  Result Value Ref Range   B Natriuretic Peptide 943.6 (H) 0.0 - 100.0 pg/mL    Comment: Performed at [facility] [address], [place], [place] [address]  SARS Coronavirus 2 [facility] order, Performed in [facility] hospital lab) Nasopharyngeal Nasopharyngeal Swab     Status: None   Collection Time: 06/24/19  8:17 PM   Specimen: Nasopharyngeal Swab  Result Value Ref Range   SARS Coronavirus 2 NEGATIVE NEGATIVE    Comment: (NOTE) If result is NEGATIVE SARS-CoV-2 target nucleic acids are NOT DETECTED. The SARS-CoV-2  RNA is generally detectable in upper and lower  respiratory specimens during the acute phase of infection. The lowest  concentration of SARS-CoV-2 viral copies this assay can detect is 250  copies / mL. A negative result does not preclude SARS-CoV-2 infection  and should not be used as the sole basis for treatment or other  patient management decisions.  A negative result may occur with  improper specimen collection / handling, submission of specimen other  than nasopharyngeal swab, presence of viral mutation(s) within the  areas targeted by this assay, and inadequate number of viral copies  (<250 copies / mL). A negative result must be combined with clinical  observations, patient history, and epidemiological information. If result is POSITIVE SARS-CoV-2 target nucleic acids are DETECTED. The SARS-CoV-2 RNA is generally detectable in upper and lower  respiratory specimens dur ing the acute phase of infection.  Positive  results are indicative of active infection with SARS-CoV-2.  Clinical  correlation with patient history and other diagnostic information is  necessary to determine patient infection status.  Positive results do  not rule out bacterial infection or co-infection with other viruses. If result is PRESUMPTIVE POSTIVE SARS-CoV-2 nucleic acids MAY BE PRESENT.   A presumptive positive result was obtained on the submitted specimen  and confirmed on repeat testing.  While 2019 novel coronavirus  (SARS-CoV-2) nucleic acids may be present in the submitted sample  additional confirmatory testing may be necessary for epidemiological  and / or clinical management purposes  to differentiate between  SARS-CoV-2 and other Sarbecovirus currently known to infect humans.  If clinically indicated additional testing with an alternate test  methodology [phone]) is advised. The SARS-CoV-2 RNA is generally  detectable  in upper and lower respiratory sp ecimens during the acute  phase of  infection. The expected result is Negative. Fact Sheet for Patients:  StrictlyIdeas.no Fact Sheet for Healthcare Providers: BankingDealers.co.za This test is not yet approved or cleared by the [place] FDA and has been authorized for detection and/or diagnosis of SARS-CoV-2 by FDA under an Emergency Use Authorization (EUA).  This EUA will remain in effect (meaning this test can be used) for the duration of the COVID-19 declaration under Section 564(b)(1) of the Act, 21 U.S.C. section 360bbb-3(b)(1), unless the authorization is terminated or revoked sooner. Performed at [facility], [place] [address] [place], [place] [address]    Dg Chest Portable 1 View  Result Date: 06/24/2019 CLINICAL DATA:  Shortness of breath, cough and fatigue EXAM: PORTABLE CHEST 1 VIEW COMPARISON:  05/17/2019 FINDINGS: Stable cardiomegaly. Diffuse bilateral interstitial opacity. Possible tiny pleural effusions. Aortic atherosclerosis. No pneumothorax. IMPRESSION: 1. Development of diffuse bilateral interstitial opacity, suspect for acute interstitial inflammatory process, possible atypical or viral infection 2. Cardiomegaly.  Possible small pleural effusions. Electronically Signed   By: Donavan Foil M.D.   On: 06/24/2019 20:49    Pending Labs Unresulted Labs (From admission, onward)    Start     Ordered   06/25/19 0500  Magnesium  Tomorrow morning,   R     06/24/19 2341   06/24/19 2346  Procalcitonin - Baseline  Add-on,   AD     06/24/19 2345   06/24/19 2250  Blood culture (routine x 2)  BLOOD CULTURE X 2,   STAT     06/24/19 2250   06/24/19 2006  Lactic acid, plasma  Now then every 2 hours,   STAT     06/24/19 2006   Signed and Held  Basic metabolic panel  Daily,   R     Signed and Held   Signed and Held  HIV antibody (Routine Screening)  Tomorrow morning,   R     Signed and Held   Signed and Held  Strep pneumoniae urinary antigen  Once,   R      Signed and Held   Signed and Held  Legionella Pneumophila Serogp 1 Ur Ag  Once,   R     Signed and Held   Signed and Held  Culture, sputum-assessment  Once,   R     Signed and Held          Vitals/Pain Today's Vitals   06/24/19 2008 06/24/19 2030 06/24/19 2145 06/25/19 0000  BP:  95/61 (!) 92/59 90/63  Pulse:  92 83 71  Resp:  20 (!) 22 20  Temp:      TempSrc:      SpO2:  92% 93% 94%  PainSc: 0-No pain       Isolation Precautions No active isolations  Medications Medications  furosemide (LASIX) injection 20 mg (has no administration in time range)  cefTRIAXone (ROCEPHIN) 1 g in sodium chloride 0.9 % 100 mL IVPB (has no administration in time range)  aspirin chewable tablet 81 mg (has no administration in time range)  atorvastatin (LIPITOR) tablet 80 mg (has no administration in time range)  carvedilol (COREG) tablet 3.125 mg (has no administration in time range)  sacubitril-valsartan (ENTRESTO) 24-26 mg per tablet (has no administration in time range)  spironolactone (ALDACTONE) tablet 25 mg (has no administration in time range)  buPROPion (WELLBUTRIN SR) 12 hr tablet 150-300 mg (has no administration in time range)  clopidogrel (PLAVIX)  tablet 75 mg (has no administration in time range)  furosemide (LASIX) injection 20 mg (has no administration in time range)  magnesium sulfate IVPB 1 g 100 mL (has no administration in time range)  potassium chloride SA (K-DUR) CR tablet 20 mEq (has no administration in time range)  doxycycline (VIBRAMYCIN) 100 mg in sodium chloride 0.9 % 250 mL IVPB (has no administration in time range)    Mobility walks Low fall risk   Focused Assessments Cardiac Assessment Handoff:    Lab Results  Component Value Date   TROPONINI >65.00 ([address]) 01/27/2019   Lab Results  Component Value Date   DDIMER 0.29 06/24/2019   Does the Patient currently have chest pain? No     R Recommendations: See Admitting Provider Note  Report given to:    Additional Notes: antbx and lasix  will be given

## 2019-06-26 LAB — HIV ANTIBODY (ROUTINE TESTING W REFLEX): HIV Screen 4th Generation wRfx: NONREACTIVE

## 2019-06-27 LAB — LEGIONELLA PNEUMOPHILA SEROGP 1 UR AG: L. pneumophila Serogp 1 Ur Ag: NEGATIVE

## 2019-06-28 ENCOUNTER — Other Ambulatory Visit (HOSPITAL_COMMUNITY): Payer: BC Managed Care – PPO

## 2019-06-30 LAB — CULTURE, BLOOD (ROUTINE X 2)
Culture: NO GROWTH
Culture: NO GROWTH

## 2019-07-08 ENCOUNTER — Other Ambulatory Visit: Payer: Self-pay

## 2019-07-08 ENCOUNTER — Ambulatory Visit (INDEPENDENT_AMBULATORY_CARE_PROVIDER_SITE_OTHER): Payer: BC Managed Care – PPO | Admitting: Cardiovascular Disease

## 2019-07-08 DIAGNOSIS — Z7901 Long term (current) use of anticoagulants: Secondary | ICD-10-CM

## 2019-07-08 DIAGNOSIS — I255 Ischemic cardiomyopathy: Secondary | ICD-10-CM

## 2019-07-08 DIAGNOSIS — I2102 ST elevation (STEMI) myocardial infarction involving left anterior descending coronary artery: Secondary | ICD-10-CM | POA: Diagnosis not present

## 2019-07-08 DIAGNOSIS — E039 Hypothyroidism, unspecified: Secondary | ICD-10-CM

## 2019-07-08 DIAGNOSIS — I2510 Atherosclerotic heart disease of native coronary artery without angina pectoris: Secondary | ICD-10-CM

## 2019-07-08 DIAGNOSIS — I236 Thrombosis of atrium, auricular appendage, and ventricle as current complications following acute myocardial infarction: Secondary | ICD-10-CM | POA: Diagnosis not present

## 2019-07-08 DIAGNOSIS — Z5181 Encounter for therapeutic drug level monitoring: Secondary | ICD-10-CM

## 2019-07-08 DIAGNOSIS — Z79899 Other long term (current) drug therapy: Secondary | ICD-10-CM | POA: Diagnosis not present

## 2019-07-08 DIAGNOSIS — E785 Hyperlipidemia, unspecified: Secondary | ICD-10-CM

## 2019-07-08 MED ORDER — ENTRESTO 49-51 MG PO TABS: 1.0000 | ORAL_TABLET | Freq: Two times a day (BID) | ORAL | 6 refills | Status: DC

## 2019-07-08 NOTE — Progress Notes (Signed)
Cardiology Office Note    Date:  07/10/2019   ID:  Tony Alvarez, DOB 08-Mar-1956, MRN [id number]  PCP:  [facility]  Cardiologist:  Shelva Majestic, MD   Initial office evaluation with me  History of Present Illness:  Tony Alvarez is a 63 y.o. male who has a history of hypertension and hyperlipidemia who developed new onset acute chest pain on January 26, 2019 which radiated to his back while raking leaves.  His chest pain persisted and he ultimately presented to any [facility] emergency room for evaluation.  His ECG showed anterior/anterolateral Q waves with ST elevation in inferior Q waves.  He was transported to [facility] in the setting of an acute anterior ST segment elevation myocardial infarction and underwent emergent catheterization by me which demonstrated total occlusion of his proximal LAD immediately after a large dilated aneurysmal segment in the proximal LAD.  He had almost a common ostium left main with immediate trifurcation into the LAD, ramus and moderate-sized circumflex vessel.  The circumflex vessel had 20% proximal narrowing.  The RCA had nonobstructive stenoses of 20% proximally 40% mid and 30% at the acute margin.  LVEDP was 22 mm.  He underwent successful acute intervention with PTCA and ultimate insertion of a 3.0 x 22 mm Resolute Onyx stent resulting in TIMI-3 flow and residual narrowing of 0%.  Once opened, the LAD also had 20% mid stenosis and 40% distal smooth stenoses and wrapped around the apex.  I had not seen the patient since doing his intervention.  A subsequent echo Doppler study showed an EF of 35 to 40% with severe akinesis of the left ventricular mid anterior anteroseptal wall, anterior wall and apical segment as well as inferoapical wall and there was evidence for small fixed thrombus in the anterior apical wall of the left ventricle.  Apparently he was placed on Xarelto for his LV thrombus.  He also had delirium concerning for alcohol withdrawal  during his hospitalization and received benzodiazepine.    Subsequently, he has had issues with shortness breath and evaluated in June 2020 with dyspnea.  COVID 19 was negative.  He had had a cough of 46 days prior to presentation.  CT scan was negative for PE.  He was noted to have LVH as well as emphysema and a left lower lobe opacity suggestive of pneumonia versus atelectasis or scarring.  He had increased interstitial markings.  He was treated with doxycycline.  He also was treated with IV Lasix.    He was evaluated on May 17, 2019 by Wynona Luna and at that time continued to be on triple drug therapy with aspirin, Plavix, and Brilinta.  He has not yet had a follow-up echo Doppler study.  He is currently on carvedilol 3.125 mg twice a day, Entresto 24/26 mg twice a day, furosemide 20 mg daily, and spironolactone 25 mg daily.  He also is on levothyroxine for hypothyroidism.  He admits to occasional shortness of breath with activity.  There also is some residual right ankle edema.  He presents for his initial evaluation with me in the office.   Past Medical History:  Diagnosis Date   Barrett's esophagus 04/21/2018   Depression    High cholesterol    Hypertension    Hypothyroid    Sleep apnea    wears CPAP at home    Past Surgical History:  Procedure Laterality Date   BIOPSY  05/24/2018   Procedure: BIOPSY;  Surgeon: Rogene Houston, MD;  [facility];  Service: Endoscopy;;  gastric   COLONOSCOPY     COLONOSCOPY N/A 05/24/2018   Procedure: COLONOSCOPY;  Surgeon: Rogene Houston, MD;  [facility];  Service: Endoscopy;  Laterality: N/A;  8:55   COLONOSCOPY WITH ESOPHAGOGASTRODUODENOSCOPY (EGD)     CORONARY STENT INTERVENTION N/A 01/26/2019   Procedure: CORONARY STENT INTERVENTION;  Surgeon: Troy Sine, MD;  Location: [place] [facility];  Service: Cardiovascular;  Laterality: N/A;   CORONARY/GRAFT ACUTE MI REVASCULARIZATION N/A 01/26/2019    Procedure: Coronary/Graft Acute MI Revascularization;  Surgeon: Troy Sine, MD;  Location: [place] [facility];  Service: Cardiovascular;  Laterality: N/A;   ESOPHAGOGASTRODUODENOSCOPY N/A 05/24/2018   Procedure: ESOPHAGOGASTRODUODENOSCOPY (EGD);  Surgeon: Rogene Houston, MD;  [facility];  Service: Endoscopy;  Laterality: N/A;   LEFT HEART CATH AND CORONARY ANGIOGRAPHY N/A 01/26/2019   Procedure: LEFT HEART CATH AND CORONARY ANGIOGRAPHY;  Surgeon: Troy Sine, MD;  Location: [place] [facility];  Service: Cardiovascular;  Laterality: N/A;   MEDIAL PARTIAL KNEE REPLACEMENT     2015   POLYPECTOMY  05/24/2018   Procedure: POLYPECTOMY;  Surgeon: Rogene Houston, MD;  [facility];  Service: Endoscopy;;  colon   rt hand injury     shoulder surgery for bursitis     TONSILLECTOMY      Current Medications: Outpatient Medications Prior to Visit  Medication Sig Dispense Refill   acetaminophen (TYLENOL) 500 MG tablet Take 500-1,000 mg by mouth daily as needed for mild pain or moderate pain.     albuterol (VENTOLIN HFA) 108 (90 Base) MCG/ACT inhaler Inhale 2 puffs into the lungs every 6 (six) hours as needed for wheezing or shortness of breath. 18 g 1   atorvastatin (LIPITOR) 80 MG tablet Take 1 tablet (80 mg total) by mouth daily at 6 PM. 90 tablet 3   buPROPion (WELLBUTRIN SR) 150 MG 12 hr tablet Take 150-300 mg by mouth See admin instructions. Take two tablets (300 mg) by mouth every morning and one tablet (150 mg) with lunch  0   carvedilol (COREG) 3.125 MG tablet Take 1 tablet (3.125 mg total) by mouth 2 (two) times daily with a meal. 60 tablet 5   clopidogrel (PLAVIX) 75 MG tablet Take 1 tablet (75 mg total) by mouth daily. 90 tablet 3   furosemide (LASIX) 20 MG tablet Take 1 tablet (20 mg total) by mouth every morning.     levothyroxine (SYNTHROID) 125 MCG tablet Take 125 mcg by mouth as needed.     Multiple Vitamin (MULTIVITAMIN WITH MINERALS) TABS  tablet Take 1 tablet by mouth every morning.     nitroGLYCERIN (NITROSTAT) 0.4 MG SL tablet Place 1 tablet (0.4 mg total) under the tongue every 5 (five) minutes x 3 doses as needed for chest pain. 25 tablet 3   rivaroxaban (XARELTO) 20 MG TABS tablet Take 1 tablet (20 mg total) by mouth daily. 30 tablet 11   spironolactone (ALDACTONE) 25 MG tablet Take 1 tablet (25 mg total) by mouth daily. 90 tablet 3   aspirin 81 MG chewable tablet Chew 1 tablet (81 mg total) by mouth daily. (Patient taking differently: Chew 81 mg by mouth every morning. )     sacubitril-valsartan (ENTRESTO) 24-26 MG Take 1 tablet by mouth 2 (two) times daily. 60 tablet 6   No facility-administered medications prior to visit.      Allergies:   Patient has no known allergies.  Social History   Socioeconomic History   Marital status: Widowed    Spouse name: Not on file   Number of children: Not on file   Years of education: Not on file   Highest education level: Not on file  Occupational History   Not on file  Social Needs   Financial resource strain: Not on file   Food insecurity    Worry: Not on file    Inability: Not on file   Transportation needs    Medical: Not on file    Non-medical: Not on file  Tobacco Use   Smoking status: Current Every Day Smoker    Packs/day: 0.25    Years: 40.00    Pack years: 10.00    Types: Cigarettes   Smokeless tobacco: Never Used   Tobacco comment: 7 cigarettes per day 05/25/19  Substance and Sexual Activity   Alcohol use: Yes    Frequency: Never    Comment: very rare   Drug use: Never   Sexual activity: Not on file  Lifestyle   Physical activity    Days per week: Not on file    Minutes per session: Not on file   Stress: Not on file  Relationships   Social connections    Talks on phone: Not on file    Gets together: Not on file    Attends religious service: Not on file    Active member of club or organization: Not on file    Attends  meetings of clubs or organizations: Not on file    Relationship status: Not on file  Other Topics Concern   Not on file  Social History Narrative   Not on file     Family History:  The patient's is negative for colon CA  ROS General: Negative; No fevers, chills, or night sweats;  HEENT: Negative; No changes in vision or hearing, sinus congestion, difficulty swallowing Pulmonary: Negative; No cough, wheezing, shortness of breath, hemoptysis Cardiovascular: See HPI  GI: Negative; No nausea, vomiting, diarrhea, or abdominal pain GU: Negative; No dysuria, hematuria, or difficulty voiding Musculoskeletal: Negative; no myalgias, joint pain, or weakness Hematologic/Oncology: Negative; no easy bruising, bleeding Endocrine: Negative; no heat/cold intolerance; no diabetes Neuro: Negative; no changes in balance, headaches Skin: Negative; No rashes or skin lesions Psychiatric: Negative; No behavioral problems, depression Sleep: Negative; No snoring, daytime sleepiness, hypersomnolence, bruxism, restless legs, hypnogognic hallucinations, no cataplexy Other comprehensive 14 point system review is negative.   PHYSICAL EXAM:   VS:  BP 103/71    Pulse 82    Ht '5\' 10"'  (1.778 m)    Wt 238 lb 6.4 oz (108.1 kg)    SpO2 94%    BMI 34.21 kg/m     Repeat blood pressure by me was 105/70  Wt Readings from Last 3 Encounters:  07/08/19 238 lb 6.4 oz (108.1 kg)  06/25/19 237 lb 3.2 oz (107.6 kg)  05/25/19 237 lb 12.8 oz (107.9 kg)    General: Alert, oriented, no distress.  Skin: normal turgor, no rashes, warm and dry HEENT: Normocephalic, atraumatic. Pupils equal round and reactive to light; sclera anicteric; extraocular muscles intact;  Nose without nasal septal hypertrophy Mouth/Parynx benign; Mallinpatti scale 3 Neck: No JVD, no carotid bruits; normal carotid upstroke Lungs: clear to ausculatation and percussion; no wheezing or rales Chest wall: without tenderness to palpitation Heart: PMI not  displaced, RRR, s1 s2 normal, 1/6 systolic murmur, no diastolic murmur, no rubs, gallops, thrills, or heaves Abdomen: soft, nontender; no  hepatosplenomehaly, BS+; abdominal aorta nontender and not dilated by palpation. Back: no CVA tenderness Pulses 2+ Musculoskeletal: full range of motion, normal strength, no joint deformities Extremities: no clubbing cyanosis or edema, Homan's sign negative  Neurologic: grossly nonfocal; Cranial nerves grossly wnl Psychologic: Normal mood and affect   Studies/Labs Reviewed:   EKG:  EKG is ordered today.  ECG (independently read by me): NSR at 82; left axis deviation, QS complex anterolaterally consistent with an anterior MI.  Inferior Q waves.    Recent Labs: BMP Latest Ref Rng & Units 06/25/2019 06/24/2019 05/10/2019  Glucose 70 - 99 mg/dL 102(H) 144(H) 108(H)  BUN 8 - 23 mg/dL '13 13 10  ' Creatinine 0.61 - 1.24 mg/dL 1.19 1.23 0.98  BUN/Creat Ratio 10 - 24 - - -  Sodium 135 - 145 mmol/L 141 141 140  Potassium 3.5 - 5.1 mmol/L 4.2 3.9 3.7  Chloride 98 - 111 mmol/L 109 110 111  CO2 22 - 32 mmol/L 22 19(L) 22  Calcium 8.9 - 10.3 mg/dL 9.3 9.2 8.9     Hepatic Function Latest Ref Rng & Units 06/24/2019 05/10/2019 03/28/2019  Total Protein 6.5 - 8.1 g/dL 6.5 6.9 6.5  Albumin 3.5 - 5.0 g/dL 3.7 4.0 4.1  AST 15 - 41 U/L 22 13(L) 13  ALT 0 - 44 U/L '22 12 10  ' Alk Phosphatase 38 - 126 U/L 68 90 122(H)  Total Bilirubin 0.3 - 1.2 mg/dL 1.2 0.8 0.7  Bilirubin, Direct 0.00 - 0.40 mg/dL - - 0.20    CBC Latest Ref Rng & Units 06/24/2019 05/10/2019 01/30/2019  WBC 4.0 - 10.5 K/uL 6.6 6.4 11.4(H)  Hemoglobin 13.0 - 17.0 g/dL 13.6 12.3(L) 13.1  Hematocrit 39.0 - 52.0 % 41.9 38.6(L) 39.8  Platelets 150 - 400 K/uL 176 171 184   Lab Results  Component Value Date   MCV 89.1 06/24/2019   MCV 90.4 05/10/2019   MCV 91.5 01/30/2019   Lab Results  Component Value Date   TSH 4.386 01/29/2019   No results found for: HGBA1C   BNP    Component Value Date/Time    BNP 943.6 (H) 06/24/2019 2006    ProBNP No results found for: PROBNP   Lipid Panel     Component Value Date/Time   CHOL 113 03/28/2019 0834   TRIG 136 03/28/2019 0834   HDL 27 (L) 03/28/2019 0834   CHOLHDL 4.2 03/28/2019 0834   CHOLHDL 5.6 01/26/2019 2056   VLDL 38 01/26/2019 2056   LDLCALC 59 03/28/2019 0834     RADIOLOGY: Dg Chest Portable 1 View  Result Date: 06/24/2019 CLINICAL DATA:  Shortness of breath, cough and fatigue EXAM: PORTABLE CHEST 1 VIEW COMPARISON:  05/17/2019 FINDINGS: Stable cardiomegaly. Diffuse bilateral interstitial opacity. Possible tiny pleural effusions. Aortic atherosclerosis. No pneumothorax. IMPRESSION: 1. Development of diffuse bilateral interstitial opacity, suspect for acute interstitial inflammatory process, possible atypical or viral infection 2. Cardiomegaly.  Possible small pleural effusions. Electronically Signed   By: Donavan Foil M.D.   On: 06/24/2019 20:49     Additional studies/ records that were reviewed today include:  Echo 01/27/2019 1. The left ventricle has moderately reduced systolic function, with an ejection fraction of 35-40%. There is mildly increased left ventricular wall thickness. 2. Severe akinesis of the left ventricular, mid-apical anteroseptal wall, anterior wall, apical segment and inferoapical wall. 3. Small, fixed thrombus on the anteroapical wall of the left ventricle.   Cath 01/26/2019  Prox RCA lesion is 20% stenosed.  Prox Cx lesion is  20% stenosed.  Prox LAD-1 lesion is 100% stenosed.  Post intervention, there is a 0% residual stenosis.  Prox LAD-2 lesion is 20% stenosed.  Mid LAD lesion is 40% stenosed.  LV end diastolic pressure is mildly elevated.  There is moderate left ventricular systolic dysfunction.  A stent was successfully placed.  Acute anterior ST segment elevation myocardial infarction secondary to total occlusion of the proximal LAD immediately after a large dilated aneurysmal  segment in the proximal LAD.  Almost a common ostium left main with immediate trifurcation into the LAD, small ramus intermediate vessel and moderate size circumflex vessel. The ramus vessel is normal. The circumflex vessel has 20% proximal narrowing and gave rise to 2 marginal branches. The RCA is a dominant vessel with moderate luminal irregularity with 20% narrowing proximally 40% mid stenosis and 30% narrowing proximal to the acute margin.  Probable moderate acute LV dysfunction with hypocontractility involving the anterolateral wall and apex. LVEDP 22 mm.  Successful PCI to the totally occluded LAD with PTCA and ultimate insertion of a 3.0 x 22 mm Resolute Onyx stent postdilated to 3.26 mm with the 100% occlusion being reduced to 0% and TIMI 0 flow improving to TIMI-3 flow. Once reperfusion was established, LAD had 20% mid LAD smooth narrowing and 40% distal smooth stenosis. The LAD wrapped around the apex and supplied the distal third of the inferior wall.  RECOMMENDATION: DAPT for minimum of 1 year. Optimal blood pressure control with ideal blood pressure less than 120/80. High potency statin therapy with target LDL less than 70. Medical therapy for concomitant CAD. A 2D echo Doppler study will be ordered improved LV function assessment.    Intervention        ASSESSMENT:    1. ST elevation myocardial infarction involving left anterior descending (LAD) coronary artery : 3/18 2020 Rx with DES stent(HCC)   2. CAD in native artery   3. Left ventricular thrombosis following MI ([place])   4. Ischemic cardiomyopathy   5. Hyperlipidemia LDL goal <70   6. Medication management   7. Hypothyroidism, unspecified type   8. Alteration in anticoagulation      PLAN:  Mr. Tony Alvarez is a 63 year old gentleman who developed new onset chest pain and was found to have acute anterior myocardial infarction on January 27, 2019.  He underwent emergent cardiac catheterization and  successful intervention to a totally occluded proximal LAD.  He did have some concomitant CAD and his LAD was successfully stented with ultimate insertion of a 3.0 x 22 mm Resolute Onyx DES stent postdilated 3.26 mm.  He did have acute LV dysfunction with EF at 35% and was also found to have a small apical mural thrombus for which he initially was started on triple drug therapy with aspirin/Plavix in addition to Xarelto.  I have not seen him since his initial intervention.  He has been seen on several instances subsequently and was recently evaluated in June with recurrent heart failure symptomatology.  In light of potential bleed risk, I am recommending he stop his aspirin therapy presently he will continue on aspirin and Xarelto.  He will need to undergo a follow-up echo Doppler study for reassessment of apical thrombus and if this has resolved I would then recommend resumption of aspirin/Plavix and discontinuance of Xarelto.  With his reduced LV function I feel he would benefit from further titration of Entresto.  However, his blood pressure is low.  As result I am recommending that he discontinue furosemide and only take  this on an as-needed basis if there is leg swelling and I will therefore try to titrate Entresto to 49/51 mg twice a day.  In 2 weeks I will schedule him for a BMP and proBNP.  I provided him samples of the 49/51 mg dose today for the next 2 weeks and he will then see our PharmD in the office to make certain he is tolerating the increased dose from a blood pressure standpoint and additional adjustments can be made at that time.  We will schedule him to undergo a subsequent echo Doppler study to reassess LV function and resolution of thrombus after he has been on the mid dose Entresto for least 4 weeks.  He continues to be on atorvastatin 80 mg for hyperlipidemia with target LDL less than 70.  He will continue his current dose of spironolactone at 25 mg but if blood pressure remains low this dose  may need to be decreased to 12.5 mg.  He continues to be on levothyroxine 125 mcg for hypothyroidism.  I reviewed his CT scan with him in detail from June which did reveal emphysema most likely resulted from his prior tobacco. I will see him in 4 to 6 weeks following his Pharm.D. evaluation and after his echo Doppler study for further evaluation and treatment.   Medication Adjustments/Labs and Tests Ordered: Current medicines are reviewed at length with the patient today.  Concerns regarding medicines are outlined above.  Medication changes, Labs and Tests ordered today are listed in the Patient Instructions below. Patient Instructions  Medication Instructions:  TAKE LASIX AS-NEEDED FOR SWELLING STOP ASPIRIN INCREASE ENTRESTO 49/51NG If you need a refill on your cardiac medications before your next appointment, please call your pharmacy.  Labwork: IN 2 WEEKS (07-22-2019) BMET AND PRO-BNP HERE IN OUR OFFICE AT [facility]  You will NOT need to fast   Take the provided lab slips with you to the lab for your blood draw.   When you have your labs (blood work) drawn today and your tests are completely normal, you will receive your results only by MyChart Message (if you have MyChart) -OR-  A paper copy in the mail.  If you have any lab test that is abnormal or we need to change your treatment, we will call you to review these results.  Testing/Procedures: Echocardiogram IN October 2020- Your physician has requested that you have an echocardiogram. Echocardiography is a painless test that uses sound waves to create images of your heart. It provides your doctor with information about the size and shape of your heart and how well your hearts chambers and valves are working. This procedure takes approximately one hour. There are no restrictions for this procedure. This will be performed at our [facility] location - [address].  Follow-Up: You will need a follow up appointment in 2-3 [place] will need a follow up appointment in October WITH Shelva Majestic, MD or one of the following Advanced Practice Providers on your designated Care Team: Almyra Deforest, PA-C  Fabian Sharp, PA-C     At [facility], you and your health needs are our priority.  As part of our continuing mission to provide you with exceptional heart care, we have created designated Provider Care Teams.  These Care Teams include your primary Cardiologist (physician) and Advanced Practice Providers (APPs -  Physician Assistants and Nurse Practitioners) who all work together to provide you with the care you need, when you need  it.  Thank you for choosing [facility] at [facility]!!        Signed, Shelva Majestic, MD  07/10/2019 2:42 PM    [facility] [address] [place], [place], [place]  [address] Phone: [phone]

## 2019-07-08 NOTE — Patient Instructions (Signed)
Medication Instructions:  TAKE LASIX AS-NEEDED FOR SWELLING STOP ASPIRIN INCREASE ENTRESTO 49/51NG If you need a refill on your cardiac medications before your next appointment, please call your pharmacy.  Labwork: IN 2 WEEKS (07-22-2019) BMET AND PRO-BNP HERE IN OUR OFFICE AT [facility]  You will NOT need to fast   Take the provided lab slips with you to the lab for your blood draw.   When you have your labs (blood work) drawn today and your tests are completely normal, you will receive your results only by MyChart Message (if you have MyChart) -OR-  A paper copy in the mail.  If you have any lab test that is abnormal or we need to change your treatment, we will call you to review these results.  Testing/Procedures: Echocardiogram IN October 2020- Your physician has requested that you have an echocardiogram. Echocardiography is a painless test that uses sound waves to create images of your heart. It provides your doctor with information about the size and shape of your heart and how well your heart's chambers and valves are working. This procedure takes approximately one hour. There are no restrictions for this procedure. This will be performed at our [facility] location - [address].  Follow-Up: You will need a follow up appointment in 2-3 [place] will need a follow up appointment in October WITH Shelva Majestic, MD or one of the following Advanced Practice Providers on your designated Care Team: [place], [place] . Fabian Sharp, PA-C     At [facility], you and your health needs are our priority.  As part of our continuing mission to provide you with exceptional heart care, we have created designated Provider Care Teams.  These Care Teams include your primary Cardiologist (physician) and Advanced Practice Providers (APPs -  Physician Assistants and Nurse Practitioners) who all work together to provide you with the care you need, when you need it.  Thank you for  choosing [facility] at [facility]!!

## 2019-07-10 ENCOUNTER — Encounter: Payer: Self-pay | Admitting: Cardiovascular Disease

## 2019-07-11 ENCOUNTER — Ambulatory Visit: Payer: BC Managed Care – PPO | Admitting: Physician Assistant

## 2019-07-26 ENCOUNTER — Ambulatory Visit: Payer: BC Managed Care – PPO

## 2019-07-26 LAB — BASIC METABOLIC PANEL WITH GFR
BUN/Creatinine Ratio: 7 — ABNORMAL LOW (ref 10–24)
BUN: 8 mg/dL (ref 8–27)
CO2: 21 mmol/L (ref 20–29)
Calcium: 10.1 mg/dL (ref 8.6–10.2)
Chloride: 104 mmol/L (ref 96–106)
Creatinine, Ser: 1.18 mg/dL (ref 0.76–1.27)
GFR calc Af Amer: 77 mL/min/1.73
GFR calc non Af Amer: 66 mL/min/1.73
Glucose: 109 mg/dL — ABNORMAL HIGH (ref 65–99)
Potassium: 5.1 mmol/L (ref 3.5–5.2)
Sodium: 140 mmol/L (ref 134–144)

## 2019-07-26 LAB — PRO B NATRIURETIC PEPTIDE: NT-Pro BNP: 992 pg/mL — ABNORMAL HIGH (ref 0–210)

## 2019-07-26 NOTE — Progress Notes (Deleted)
Patient ID: Tony Alvarez                 DOB: 1956-08-21                      MRN: [id number]     HPI: Tony Alvarez is a 63 y.o. male referred by [name] to HTN clinic. PMH includes LV mural thrombus, ischemic cardiomyopathy with EF 35%, CAD, COPD, and hyperlipidemia  Current HTN meds:  Entresto 49-51mg  BID Carvedilol 3.125mg  BID Furosemide 20mg  daily Spironolactone 25mg  daily  BP goal: <130/80  Family History:   Social History:   Diet:   Exercise:   Home BP readings:   Wt Readings from Last 3 Encounters:  07/08/19 238 lb 6.4 oz (108.1 kg)  06/25/19 237 lb 3.2 oz (107.6 kg)  05/25/19 237 lb 12.8 oz (107.9 kg)   BP Readings from Last 3 Encounters:  07/08/19 103/71  06/25/19 94/64  05/25/19 108/72   Pulse Readings from Last 3 Encounters:  07/08/19 82  06/25/19 79  05/25/19 (!) 103    Renal function: CrCl cannot be calculated (Patient's most recent lab result is older than the maximum 21 days allowed.).  Past Medical History:  Diagnosis Date  . Barrett's esophagus 04/21/2018  . Depression   . High cholesterol   . Hypertension   . Hypothyroid   . Sleep apnea    wears CPAP at home    Current Outpatient Medications on File Prior to Visit  Medication Sig Dispense Refill  . acetaminophen (TYLENOL) 500 MG tablet Take 500-1,000 mg by mouth daily as needed for mild pain or moderate pain.    Marland Kitchen albuterol (VENTOLIN HFA) 108 (90 Base) MCG/ACT inhaler Inhale 2 puffs into the lungs every 6 (six) hours as needed for wheezing or shortness of breath. 18 g 1  . atorvastatin (LIPITOR) 80 MG tablet Take 1 tablet (80 mg total) by mouth daily at 6 PM. 90 tablet 3  . buPROPion (WELLBUTRIN SR) 150 MG 12 hr tablet Take 150-300 mg by mouth See admin instructions. Take two tablets (300 mg) by mouth every morning and one tablet (150 mg) with lunch  0  . carvedilol (COREG) 3.125 MG tablet Take 1 tablet (3.125 mg total) by mouth 2 (two) times daily with a meal. 60 tablet 5  . clopidogrel  (PLAVIX) 75 MG tablet Take 1 tablet (75 mg total) by mouth daily. 90 tablet 3  . furosemide (LASIX) 20 MG tablet Take 1 tablet (20 mg total) by mouth every morning.    Marland Kitchen levothyroxine (SYNTHROID) 125 MCG tablet Take 125 mcg by mouth as needed.    . Multiple Vitamin (MULTIVITAMIN WITH MINERALS) TABS tablet Take 1 tablet by mouth every morning.    . nitroGLYCERIN (NITROSTAT) 0.4 MG SL tablet Place 1 tablet (0.4 mg total) under the tongue every 5 (five) minutes x 3 doses as needed for chest pain. 25 tablet 3  . rivaroxaban (XARELTO) 20 MG TABS tablet Take 1 tablet (20 mg total) by mouth daily. 30 tablet 11  . sacubitril-valsartan (ENTRESTO) 49-51 MG Take 1 tablet by mouth 2 (two) times daily. 60 tablet 6  . spironolactone (ALDACTONE) 25 MG tablet Take 1 tablet (25 mg total) by mouth daily. 90 tablet 3   No current facility-administered medications on file prior to visit.     No Known Allergies  There were no vitals taken for this visit.  No problem-specific Assessment & Plan notes found for this encounter.  Marieelena Bartko Rodriguez-Guzman PharmD, BCPS, CPP [place] [address] 07/26/2019 10:48 AM

## 2019-08-17 ENCOUNTER — Other Ambulatory Visit: Payer: Self-pay

## 2019-08-17 ENCOUNTER — Ambulatory Visit (HOSPITAL_COMMUNITY): Payer: BC Managed Care – PPO | Attending: Cardiovascular Disease

## 2019-08-17 DIAGNOSIS — I236 Thrombosis of atrium, auricular appendage, and ventricle as current complications following acute myocardial infarction: Secondary | ICD-10-CM

## 2019-08-17 MED ORDER — PERFLUTREN LIPID MICROSPHERE
1.0000 mL | INTRAVENOUS | Status: AC | PRN
  Administered 2019-08-17: 1 mL via INTRAVENOUS

## 2019-08-29 ENCOUNTER — Ambulatory Visit (INDEPENDENT_AMBULATORY_CARE_PROVIDER_SITE_OTHER): Payer: BC Managed Care – PPO | Admitting: Cardiovascular Disease

## 2019-08-29 ENCOUNTER — Encounter: Payer: Self-pay | Admitting: Cardiovascular Disease

## 2019-08-29 ENCOUNTER — Other Ambulatory Visit: Payer: Self-pay

## 2019-08-29 DIAGNOSIS — I2510 Atherosclerotic heart disease of native coronary artery without angina pectoris: Secondary | ICD-10-CM

## 2019-08-29 DIAGNOSIS — I255 Ischemic cardiomyopathy: Secondary | ICD-10-CM

## 2019-08-29 DIAGNOSIS — I2102 ST elevation (STEMI) myocardial infarction involving left anterior descending coronary artery: Secondary | ICD-10-CM | POA: Diagnosis not present

## 2019-08-29 DIAGNOSIS — I10 Essential (primary) hypertension: Secondary | ICD-10-CM | POA: Diagnosis not present

## 2019-08-29 DIAGNOSIS — E039 Hypothyroidism, unspecified: Secondary | ICD-10-CM

## 2019-08-29 MED ORDER — SPIRONOLACTONE 25 MG PO TABS: 12.5000 mg | ORAL_TABLET | Freq: Every day | ORAL | 1 refills | Status: DC

## 2019-08-29 MED ORDER — FUROSEMIDE 20 MG PO TABS: 20.0000 mg | ORAL_TABLET | ORAL | Status: DC | PRN

## 2019-08-29 NOTE — Progress Notes (Signed)
Cardiology Office Note    Date:  09/01/2019   ID:  Tony Alvarez, DOB [birth year], MRN [id number]  PCP:  [facility]  Cardiologist:  Tony Majestic, MD   F/U evaluation  History of Present Illness:  Tony Alvarez is a 63 y.o. male who presents for a 2 month F/U cardiology evaluation.  Mr. Tony Alvarez has a history of hypertension and hyperlipidemia who developed new onset acute chest pain on January 26, 2019 which radiated to his back while raking leaves.  His chest pain persisted and he ultimately presented to any [facility] emergency room for evaluation.  His ECG showed anterior/anterolateral Q waves with ST elevation in inferior Q waves.  He was transported to [facility] in the setting of an acute anterior ST segment elevation myocardial infarction and underwent emergent catheterization by me which demonstrated total occlusion of his proximal LAD immediately after a large dilated aneurysmal segment in the proximal LAD.  He had almost a common ostium left main with immediate trifurcation into the LAD, ramus and moderate-sized circumflex vessel.  The circumflex vessel had 20% proximal narrowing.  The RCA had nonobstructive stenoses of 20% proximally 40% mid and 30% at the acute margin.  LVEDP was 22 mm.  He underwent successful acute intervention with PTCA and ultimate insertion of a 3.0 x 22 mm Resolute Onyx stent resulting in TIMI-3 flow and residual narrowing of 0%.  Once opened, the LAD also had 20% mid stenosis and 40% distal smooth stenoses and wrapped around the apex.  I had not seen the patient since doing his intervention.  A subsequent echo Doppler study showed an EF of 35 to 40% with severe akinesis of the left ventricular mid anterior anteroseptal wall, anterior wall and apical segment as well as inferoapical wall and there was evidence for small fixed thrombus in the anterior apical wall of the left ventricle.  Apparently he was placed on Xarelto for his LV thrombus.  He also had  delirium concerning for alcohol withdrawal during his hospitalization and received benzodiazepine.    Subsequently, he has had issues with shortness breath and was evaluated in June 2020 with dyspnea.  COVID 19 was negative.  He had had a cough prior to presentation.  CT scan was negative for PE.  He was noted to have LVH as well as emphysema and a left lower lobe opacity suggestive of pneumonia versus atelectasis or scarring.  He had increased interstitial markings.  He was treated with doxycycline.  He also was treated with IV Lasix.    He was evaluated on May 17, 2019 by Wynona Luna and at that time continued to be on triple drug therapy with aspirin, Plavix, and Brilinta.  He has not yet had a follow-up echo Doppler study.  He was currently on carvedilol 3.125 mg twice a day, Entresto 24/26 mg twice a day, furosemide 20 mg daily, and spironolactone 25 mg daily.  He also is on levothyroxine for hypothyroidism.  He admits to occasional shortness of breath with activity.  There also is some residual right ankle edema.    I last saw him in August 2020.  He reduce bleed risk I recommended he continue aspirin.  I also recommended he reduce his Lasix and only take as needed for swelling to allow for further titration of Entresto to 49/51 mg twice a day.  He underwent an echo Doppler study in August 17, 2019 continue to show an EF at 25 to 30% with grade 2 diastolic  dysfunction.  There was akinesis of the mid distal anteroseptal wall, apex and apical lateral regions.  Definity contrast did not reveal any evidence for apical thrombi.  Presently he denies any recurrent episodes of chest pain.  Apparently he never stopped his furosemide and has continued to take this 20 mg every morning.  He continues to be on Xarelto and Plavix.  He is on Entresto 49/51 mg twice a day, spironolactone 25 mg in the morning, and carvedilol 3.125 mg twice a day.  He is on atorvastatin 80 mg for hyperlipidemia and  levothyroxine 125 mcg for hypothyroidism.  He presents for evaluation.  Past Medical History:  Diagnosis Date   Barrett's esophagus 04/21/2018   Depression    High cholesterol    Hypertension    Hypothyroid    Sleep apnea    wears CPAP at home    Past Surgical History:  Procedure Laterality Date   BIOPSY  05/24/2018   Procedure: BIOPSY;  Surgeon: Rogene Houston, MD;  [facility];  Service: Endoscopy;;  gastric   COLONOSCOPY     COLONOSCOPY N/A 05/24/2018   Procedure: COLONOSCOPY;  Surgeon: Rogene Houston, MD;  [facility];  Service: Endoscopy;  Laterality: N/A;  8:55   COLONOSCOPY WITH ESOPHAGOGASTRODUODENOSCOPY (EGD)     CORONARY STENT INTERVENTION N/A 01/26/2019   Procedure: CORONARY STENT INTERVENTION;  Surgeon: Troy Sine, MD;  Location: [place] [facility];  Service: Cardiovascular;  Laterality: N/A;   CORONARY/GRAFT ACUTE MI REVASCULARIZATION N/A 01/26/2019   Procedure: Coronary/Graft Acute MI Revascularization;  Surgeon: Troy Sine, MD;  Location: [place] [facility];  Service: Cardiovascular;  Laterality: N/A;   ESOPHAGOGASTRODUODENOSCOPY N/A 05/24/2018   Procedure: ESOPHAGOGASTRODUODENOSCOPY (EGD);  Surgeon: Rogene Houston, MD;  [facility];  Service: Endoscopy;  Laterality: N/A;   LEFT HEART CATH AND CORONARY ANGIOGRAPHY N/A 01/26/2019   Procedure: LEFT HEART CATH AND CORONARY ANGIOGRAPHY;  Surgeon: Troy Sine, MD;  Location: [place] [facility];  Service: Cardiovascular;  Laterality: N/A;   MEDIAL PARTIAL KNEE REPLACEMENT     2015   POLYPECTOMY  05/24/2018   Procedure: POLYPECTOMY;  Surgeon: Rogene Houston, MD;  [facility];  Service: Endoscopy;;  colon   rt hand injury     shoulder surgery for bursitis     TONSILLECTOMY      Current Medications: Outpatient Medications Prior to Visit  Medication Sig Dispense Refill   acetaminophen (TYLENOL) 500 MG tablet Take 500-1,000 mg by mouth daily  as needed for mild pain or moderate pain.     albuterol (VENTOLIN HFA) 108 (90 Base) MCG/ACT inhaler Inhale 2 puffs into the lungs every 6 (six) hours as needed for wheezing or shortness of breath. 18 g 1   atorvastatin (LIPITOR) 80 MG tablet Take 1 tablet (80 mg total) by mouth daily at 6 PM. 90 tablet 3   buPROPion (WELLBUTRIN SR) 150 MG 12 hr tablet Take 150-300 mg by mouth See admin instructions. Take two tablets (300 mg) by mouth every morning and one tablet (150 mg) with lunch  0   carvedilol (COREG) 3.125 MG tablet Take 1 tablet (3.125 mg total) by mouth 2 (two) times daily with a meal. 60 tablet 5   clopidogrel (PLAVIX) 75 MG tablet Take 1 tablet (75 mg total) by mouth daily. 90 tablet 3   levothyroxine (SYNTHROID) 125 MCG tablet Take 125 mcg by mouth as needed.     Multiple Vitamin (MULTIVITAMIN  WITH MINERALS) TABS tablet Take 1 tablet by mouth every morning.     nitroGLYCERIN (NITROSTAT) 0.4 MG SL tablet Place 1 tablet (0.4 mg total) under the tongue every 5 (five) minutes x 3 doses as needed for chest pain. 25 tablet 3   rivaroxaban (XARELTO) 20 MG TABS tablet Take 1 tablet (20 mg total) by mouth daily. 30 tablet 11   sacubitril-valsartan (ENTRESTO) 49-51 MG Take 1 tablet by mouth 2 (two) times daily. 60 tablet 6   furosemide (LASIX) 20 MG tablet Take 1 tablet (20 mg total) by mouth every morning.     spironolactone (ALDACTONE) 25 MG tablet Take 1 tablet (25 mg total) by mouth daily. 90 tablet 3   No facility-administered medications prior to visit.      Allergies:   Patient has no known allergies.   Social History   Socioeconomic History   Marital status: Widowed    Spouse name: Not on file   Number of children: Not on file   Years of education: Not on file   Highest education level: Not on file  Occupational History   Not on file  Social Needs   Financial resource strain: Not on file   Food insecurity    Worry: Not on file    Inability: Not on file    Transportation needs    Medical: Not on file    Non-medical: Not on file  Tobacco Use   Smoking status: Current Every Day Smoker    Packs/day: 0.25    Years: 40.00    Pack years: 10.00    Types: Cigarettes   Smokeless tobacco: Never Used   Tobacco comment: 7 cigarettes per day 05/25/19  Substance and Sexual Activity   Alcohol use: Yes    Frequency: Never    Comment: very rare   Drug use: Never   Sexual activity: Not on file  Lifestyle   Physical activity    Days per week: Not on file    Minutes per session: Not on file   Stress: Not on file  Relationships   Social connections    Talks on phone: Not on file    Gets together: Not on file    Attends religious service: Not on file    Active member of club or organization: Not on file    Attends meetings of clubs or organizations: Not on file    Relationship status: Not on file  Other Topics Concern   Not on file  Social History Narrative   Not on file     Family History:  The patient's is negative for colon CA  ROS General: Negative; No fevers, chills, or night sweats;  HEENT: Negative; No changes in vision or hearing, sinus congestion, difficulty swallowing Pulmonary: Negative; No cough, wheezing, shortness of breath, hemoptysis Cardiovascular: See HPI  GI: Negative; No nausea, vomiting, diarrhea, or abdominal pain GU: Negative; No dysuria, hematuria, or difficulty voiding Musculoskeletal: Negative; no myalgias, joint pain, or weakness Hematologic/Oncology: Negative; no easy bruising, bleeding Endocrine: Negative; no heat/cold intolerance; no diabetes Neuro: Negative; no changes in balance, headaches Skin: Negative; No rashes or skin lesions Psychiatric: Negative; No behavioral problems, depression Sleep: Negative; No snoring, daytime sleepiness, hypersomnolence, bruxism, restless legs, hypnogognic hallucinations, no cataplexy Other comprehensive 14 point system review is negative.   PHYSICAL EXAM:   VS:   BP 119/78    Pulse 86    Temp (!) 97.1 F (36.2 C)    Ht _0  (1.778 m)  Wt 249 lb 9.6 oz (113.2 kg)    SpO2 96%    BMI 35.81 kg/m     Pete blood pressure by me was 104/76  Wt Readings from Last 3 Encounters:  08/29/19 249 lb 9.6 oz (113.2 kg)  07/08/19 238 lb 6.4 oz (108.1 kg)  06/25/19 237 lb 3.2 oz (107.6 kg)    General: Alert, oriented, no distress.  Skin: normal turgor, no rashes, warm and dry HEENT: Normocephalic, atraumatic. Pupils equal round and reactive to light; sclera anicteric; extraocular muscles intact;  Nose without nasal septal hypertrophy Mouth/Parynx benign; Mallinpatti scale 3 Neck: No JVD, no carotid bruits; normal carotid upstroke Lungs: clear to ausculatation and percussion; no wheezing or rales Chest wall: without tenderness to palpitation Heart: PMI not displaced, RRR, s1 s2 normal, 1/6 systolic murmur, no diastolic murmur, no rubs, gallops, thrills, or heaves Abdomen: soft, nontender; no hepatosplenomehaly, BS+; abdominal aorta nontender and not dilated by palpation. Back: no CVA tenderness Pulses 2+ Musculoskeletal: full range of motion, normal strength, no joint deformities Extremities: no clubbing cyanosis or edema, Homan's sign negative  Neurologic: grossly nonfocal; Cranial nerves grossly wnl Psychologic: Normal mood and affect   Studies/Labs Reviewed:   EKG:  EKG is ordered today. ECG (independently read by me):  NSR at 88; IVCD, Inferior Q waves and QS V1-6.  July 08, 2019 ECG (independently read by me): NSR at 82; left axis deviation, QS complex anterolaterally consistent with an anterior MI.  Inferior Q waves.    Recent Labs: BMP Latest Ref Rng & Units 07/26/2019 06/25/2019 06/24/2019  Glucose 65 - 99 mg/dL 109(H) 102(H) 144(H)  BUN 8 - 27 mg/dL _0 Creatinine 0.76 - 1.27 mg/dL 1.18 1.19 1.23  BUN/Creat Ratio 10 - 24 7(L) - -  Sodium 134 - 144 mmol/L 140 141 141  Potassium 3.5 - 5.2 mmol/L 5.1 4.2 3.9  Chloride 96 - 106 mmol/L  104 109 110  CO2 20 - 29 mmol/L 21 22 19(L)  Calcium 8.6 - 10.2 mg/dL 10.1 9.3 9.2     Hepatic Function Latest Ref Rng & Units 06/24/2019 05/10/2019 03/28/2019  Total Protein 6.5 - 8.1 g/dL 6.5 6.9 6.5  Albumin 3.5 - 5.0 g/dL 3.7 4.0 4.1  AST 15 - 41 U/L 22 13(L) 13  ALT 0 - 44 U/L _1 Alk Phosphatase 38 - 126 U/L 68 90 122(H)  Total Bilirubin 0.3 - 1.2 mg/dL 1.2 0.8 0.7  Bilirubin, Direct 0.00 - 0.40 mg/dL - - 0.20    CBC Latest Ref Rng & Units 06/24/2019 05/10/2019 01/30/2019  WBC 4.0 - 10.5 K/uL 6.6 6.4 11.4(H)  Hemoglobin 13.0 - 17.0 g/dL 13.6 12.3(L) 13.1  Hematocrit 39.0 - 52.0 % 41.9 38.6(L) 39.8  Platelets 150 - 400 K/uL 176 171 184   Lab Results  Component Value Date   MCV 89.1 06/24/2019   MCV 90.4 05/10/2019   MCV 91.5 01/30/2019   Lab Results  Component Value Date   TSH 4.386 01/29/2019   No results found for: HGBA1C   BNP    Component Value Date/Time   BNP 943.6 (H) 06/24/2019 2006    ProBNP    Component Value Date/Time   PROBNP 992 (H) 07/26/2019 1102     Lipid Panel     Component Value Date/Time   CHOL 113 03/28/2019 0834   TRIG 136 03/28/2019 0834   HDL 27 (L) 03/28/2019 0834   CHOLHDL 4.2 03/28/2019 0834   CHOLHDL 5.6 01/26/2019 2056  VLDL 38 01/26/2019 2056   LDLCALC 59 03/28/2019 0834     RADIOLOGY: No results found.   Additional studies/ records that were reviewed today include:  Echo 01/27/2019 1. The left ventricle has moderately reduced systolic function, with an ejection fraction of 35-40%. There is mildly increased left ventricular wall thickness. 2. Severe akinesis of the left ventricular, mid-apical anteroseptal wall, anterior wall, apical segment and inferoapical wall. 3. Small, fixed thrombus on the anteroapical wall of the left ventricle.   Cath 01/26/2019  Prox RCA lesion is 20% stenosed.  Prox Cx lesion is 20% stenosed.  Prox LAD-1 lesion is 100% stenosed.  Post intervention, there is a 0% residual  stenosis.  Prox LAD-2 lesion is 20% stenosed.  Mid LAD lesion is 40% stenosed.  LV end diastolic pressure is mildly elevated.  There is moderate left ventricular systolic dysfunction.  A stent was successfully placed.  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  Almost a common ostium left main with immediate trifurcation into the LAD, small ramus intermediate vessel and moderate size circumflex vessel. The ramus vessel is normal. The circumflex vessel has 20% proximal narrowing and gave rise to 2 marginal branches. The RCA is a dominant vessel with moderate luminal irregularity with 20% narrowing proximally 40% mid stenosis and 30% narrowing proximal to the acute margin.  Probable moderate acute LV dysfunction with hypocontractility involving the anterolateral wall and apex. LVEDP 22 mm.  Successful PCI to the totally occluded LAD with PTCA and ultimate insertion of a 3.0 x 22 mm Resolute Onyx stent postdilated to 3.26 mm with the 100% occlusion being reduced to 0% and TIMI 0 flow improving to TIMI-3 flow. Once reperfusion was established, LAD had 20% mid LAD smooth narrowing and 40% distal smooth stenosis. The LAD wrapped around the apex and supplied the distal third of the inferior wall.  RECOMMENDATION: DAPT for minimum of 1 year. Optimal blood pressure control with ideal blood pressure less than 120/80. High potency statin therapy with target LDL less than 70. Medical therapy for concomitant CAD. A 2D echo Doppler study will be ordered improved LV function assessment.    Intervention        ASSESSMENT:    1. CAD in native artery   2. ST elevation myocardial infarction involving left anterior descending (LAD) coronary artery : 3/18 2020 Rx with DES stent(HCC)   3. Ischemic cardiomyopathy   4. Essential hypertension   5.  Hypothyroidism, unspecified type     PLAN:  Tony Alvarez is a 63 year old gentleman who developed new onset chest pain and was found to have acute anterior myocardial infarction on January 27, 2019.  He underwent emergent cardiac catheterization and successful intervention to a totally occluded proximal LAD.  He did have some concomitant CAD and his LAD was successfully stented with ultimate insertion of a 3.0 x 22 mm Resolute Onyx DES stent postdilated 3.26 mm.  There was acute LV dysfunction with EF at 35% and was also found to have a small apical mural thrombus for which he initially was started on triple drug therapy with aspirin/Plavix in addition to Xarelto.  I had not seen him since his initial evaluation until he presented to the office to see me in August 2020.  At that time, I recommended he discontinue aspirin and continue on Plavix and Xarelto to reduce potential bleed risk.  He was on  initial dose Entresto and his dose was further titrated to 49/51 mg twice a day.  Due to somewhat low blood pressure I had recommended he decrease his Lasix and only take this on an as-needed basis.  Apparently he never reduce the Lasix dose.  His blood pressure today continues to be low and on repeat by me was 104/76.  Does not have any edema.  I am recommending he discontinue furosemide but again only take 20 mg on a as needed basis if leg swelling recurs.  I will also change his spironolactone from 25 mg in the morning to 12.5 mg twice a day.  He continues to be on atorvastatin 80 mg for hyperlipidemia with target LDL less than 70.  In 6 weeks I have recommended he see our pharmacist for further evaluation of his blood pressure and possible further titration of Entresto depending upon his blood pressure.  NT proBNP on July 26, 2019 was increased at 992.  His most recent lipid panel in May 2020 showed an LDL cholesterol at 59 on high potency statin therapy.  I will see him in 3 months for reevaluation or  sooner problems arise.    Medication Adjustments/Labs and Tests Ordered: Current medicines are reviewed at length with the patient today.  Concerns regarding medicines are outlined above.  Medication changes, Labs and Tests ordered today are listed in the Patient Instructions below. Patient Instructions  Medication Instructions:  ONLY TAKE FUROSEMIDE AS NEEDED FOR SWELLING   DECREASE SPIRONOLACTONE 12.5MG DAILY If you need a refill on your cardiac medications before your next appointment, please call your pharmacy.  Follow-Up: IN 6 WEEKS WITH THE PHARMACIST  IN 3 months In Person You may see Tony Majestic, MD or one of the following Advanced Practice Providers on your designated Care Team:  Rosaria Ferries, PA-C Jory Sims, DNP, ANP Cadence Kathlen Mody, NP .    At [facility], you and your health needs are our priority.  As part of our continuing mission to provide you with exceptional heart care, we have created designated Provider Care Teams.  These Care Teams include your primary Cardiologist (physician) and Advanced Practice Providers (APPs -  Physician Assistants and Nurse Practitioners) who all work together to provide you with the care you need, when you need it.  Thank you for choosing [facility] at [facility]!!          Signed, Tony Majestic, MD  09/01/2019 2:33 PM    [place] [address] [place], [place], [place]  [address] Phone: [phone]

## 2019-08-29 NOTE — Patient Instructions (Addendum)
Medication Instructions:  ONLY TAKE FUROSEMIDE AS NEEDED FOR SWELLING   DECREASE SPIRONOLACTONE 12.5MG  DAILY If you need a refill on your cardiac medications before your next appointment, please call your pharmacy.  Follow-Up: IN 6 WEEKS WITH THE PHARMACIST  IN 3 months In Person You may see Shelva Majestic, MD or one of the following Advanced Practice Providers on your designated Care Team:  Rosaria Ferries, PA-C Jory Sims, DNP, ANP Cadence Kathlen Mody, NP .    At [facility], you and your health needs are our priority.  As part of our continuing mission to provide you with exceptional heart care, we have created designated Provider Care Teams.  These Care Teams include your primary Cardiologist (physician) and Advanced Practice Providers (APPs -  Physician Assistants and Nurse Practitioners) who all work together to provide you with the care you need, when you need it.  Thank you for choosing [facility] at [facility]!!

## 2019-08-31 ENCOUNTER — Telehealth: Payer: Self-pay | Admitting: Cardiovascular Disease

## 2019-08-31 NOTE — Telephone Encounter (Signed)
New Message:      Please call, concerning pt that was seen by Dr Claiborne Billings yesterday.

## 2019-08-31 NOTE — Telephone Encounter (Signed)
Spoke with Tony Alvarez, the patient has called them this morning regarding low bp and they are asking what changes we made yesterday. Aware dr Evette Georges note not done but gave the medication changes on the AVS and answers regarding our medication list answered. They will call back if needed.

## 2019-09-01 ENCOUNTER — Encounter: Payer: Self-pay | Admitting: Cardiovascular Disease

## 2019-09-05 ENCOUNTER — Telehealth: Payer: Self-pay | Admitting: Cardiovascular Disease

## 2019-09-05 NOTE — Telephone Encounter (Signed)
Returned call to pt he states that his weight is up 5# today. Informed he can take lasix 20mg  for swelling x2days and to CB if this does not help. He states that he will take lasix today and Tuesday and will CB if ineffective Wednesday.

## 2019-09-05 NOTE — Telephone Encounter (Signed)
New message   Pt c/o swelling: STAT is pt has developed SOB within 24 hours  1) How much weight have you gained and in what time span? 5lbs within 24 hours   2) If swelling, where is the swelling located? Right ankle   3) Are you currently taking a fluid pill? No   4) Are you currently SOB?yes   5) Do you have a log of your daily weights (if so, list)? yes  6) Have you gained 3 pounds in a day or 5 pounds in a week? Yes   7) Have you traveled recently? No   .

## 2019-09-08 ENCOUNTER — Telehealth: Payer: Self-pay | Admitting: Cardiovascular Disease

## 2019-09-08 NOTE — Telephone Encounter (Signed)
New Message      Pt c/o swelling: STAT is pt has developed SOB within 24 hours  1) How much weight have you gained and in what time span? Yes, 7 pounds   2) If swelling, where is the swelling located? Right Ankle   3) Are you currently taking a fluid pill? Yes the last 2 days   4) Are you currently SOB?  No   5) Do you have a log of your daily weights (if so, list)? Yes   6) Have you gained 3 pounds in a day or 5 pounds in a week? Sunday 252-257 and then 253-256 Today 255   7) Have you traveled recently? No

## 2019-09-08 NOTE — Telephone Encounter (Signed)
Spoke with pt who states he had 5 lb weight gain from Sunday to Monday, 252 lbs to 257 lbs. Pt confirms that he called in on 10/26 and was advised to take his prn lasix for 2 days. He states that he did note increase in output with weight on Tuesday 253 lbs. He states weight Wednesday 256 lbs and today 255 lbs. He denies SOB today and says right ankle still swollen and swelling is about the same. He confirmed that he does take spironolactone 12.5mg  daily. Denies indiscretion with salt and states he cooks most of the food he eats. Pt last BP checked at 1000; reading 106/71. Informed pt that nurse will consult pharmD or provider and contact with recommendations

## 2019-09-08 NOTE — Telephone Encounter (Signed)
Spoke with patient.  Apparently foot has been swelling off/on for some time (pt vague about if this is weeks or months).  States dry weight is 245, but this morning was at 255.  Denies any trauma to ankle/foot, states occasional throbbing, but not regularly painful.    Advised patient to continue furosemide 20 mg qd x 2 days and will review with [name] in the morning.

## 2019-09-11 ENCOUNTER — Inpatient Hospital Stay (HOSPITAL_COMMUNITY)
Admission: EM | Admit: 2019-09-11 | Discharge: 2019-09-13 | DRG: 291 | Disposition: A | Discharge status: Home / Self Care | Payer: BC Managed Care – PPO | Attending: Family Medicine | Admitting: Family Medicine

## 2019-09-11 ENCOUNTER — Inpatient Hospital Stay (HOSPITAL_COMMUNITY): Payer: BC Managed Care – PPO

## 2019-09-11 ENCOUNTER — Encounter (HOSPITAL_COMMUNITY): Payer: Self-pay | Admitting: Emergency Medicine

## 2019-09-11 ENCOUNTER — Other Ambulatory Visit: Payer: Self-pay

## 2019-09-11 ENCOUNTER — Emergency Department (HOSPITAL_COMMUNITY): Payer: BC Managed Care – PPO

## 2019-09-11 DIAGNOSIS — E785 Hyperlipidemia, unspecified: Secondary | ICD-10-CM | POA: Diagnosis present

## 2019-09-11 DIAGNOSIS — N179 Acute kidney failure, unspecified: Secondary | ICD-10-CM | POA: Diagnosis present

## 2019-09-11 DIAGNOSIS — F17210 Nicotine dependence, cigarettes, uncomplicated: Secondary | ICD-10-CM | POA: Diagnosis present

## 2019-09-11 DIAGNOSIS — I252 Old myocardial infarction: Secondary | ICD-10-CM

## 2019-09-11 DIAGNOSIS — J449 Chronic obstructive pulmonary disease, unspecified: Secondary | ICD-10-CM | POA: Diagnosis present

## 2019-09-11 DIAGNOSIS — I5023 Acute on chronic systolic (congestive) heart failure: Secondary | ICD-10-CM | POA: Diagnosis present

## 2019-09-11 DIAGNOSIS — Z79890 Hormone replacement therapy (postmenopausal): Secondary | ICD-10-CM

## 2019-09-11 DIAGNOSIS — F1020 Alcohol dependence, uncomplicated: Secondary | ICD-10-CM | POA: Diagnosis present

## 2019-09-11 DIAGNOSIS — K2270 Barrett's esophagus without dysplasia: Secondary | ICD-10-CM | POA: Diagnosis present

## 2019-09-11 DIAGNOSIS — J9621 Acute and chronic respiratory failure with hypoxia: Secondary | ICD-10-CM | POA: Diagnosis not present

## 2019-09-11 DIAGNOSIS — F1010 Alcohol abuse, uncomplicated: Secondary | ICD-10-CM | POA: Diagnosis present

## 2019-09-11 DIAGNOSIS — G4733 Obstructive sleep apnea (adult) (pediatric): Secondary | ICD-10-CM | POA: Diagnosis present

## 2019-09-11 DIAGNOSIS — Z955 Presence of coronary angioplasty implant and graft: Secondary | ICD-10-CM | POA: Diagnosis not present

## 2019-09-11 DIAGNOSIS — I2510 Atherosclerotic heart disease of native coronary artery without angina pectoris: Secondary | ICD-10-CM | POA: Diagnosis present

## 2019-09-11 DIAGNOSIS — J9620 Acute and chronic respiratory failure, unspecified whether with hypoxia or hypercapnia: Secondary | ICD-10-CM | POA: Diagnosis present

## 2019-09-11 DIAGNOSIS — F329 Major depressive disorder, single episode, unspecified: Secondary | ICD-10-CM | POA: Diagnosis present

## 2019-09-11 DIAGNOSIS — I110 Hypertensive heart disease with heart failure: Secondary | ICD-10-CM | POA: Diagnosis present

## 2019-09-11 DIAGNOSIS — G4730 Sleep apnea, unspecified: Secondary | ICD-10-CM | POA: Diagnosis present

## 2019-09-11 DIAGNOSIS — I10 Essential (primary) hypertension: Secondary | ICD-10-CM | POA: Diagnosis present

## 2019-09-11 DIAGNOSIS — R0602 Shortness of breath: Secondary | ICD-10-CM

## 2019-09-11 DIAGNOSIS — I5043 Acute on chronic combined systolic (congestive) and diastolic (congestive) heart failure: Secondary | ICD-10-CM | POA: Diagnosis present

## 2019-09-11 DIAGNOSIS — Z86718 Personal history of other venous thrombosis and embolism: Secondary | ICD-10-CM | POA: Diagnosis not present

## 2019-09-11 DIAGNOSIS — Z9119 Patient's noncompliance with other medical treatment and regimen: Secondary | ICD-10-CM

## 2019-09-11 DIAGNOSIS — I255 Ischemic cardiomyopathy: Secondary | ICD-10-CM | POA: Diagnosis present

## 2019-09-11 DIAGNOSIS — Z96659 Presence of unspecified artificial knee joint: Secondary | ICD-10-CM | POA: Diagnosis present

## 2019-09-11 DIAGNOSIS — E7800 Pure hypercholesterolemia, unspecified: Secondary | ICD-10-CM | POA: Diagnosis present

## 2019-09-11 DIAGNOSIS — J439 Emphysema, unspecified: Secondary | ICD-10-CM | POA: Diagnosis present

## 2019-09-11 DIAGNOSIS — Z6835 Body mass index (BMI) 35.0-35.9, adult: Secondary | ICD-10-CM | POA: Diagnosis not present

## 2019-09-11 DIAGNOSIS — E669 Obesity, unspecified: Secondary | ICD-10-CM | POA: Diagnosis present

## 2019-09-11 DIAGNOSIS — E039 Hypothyroidism, unspecified: Secondary | ICD-10-CM | POA: Diagnosis present

## 2019-09-11 DIAGNOSIS — Z79899 Other long term (current) drug therapy: Secondary | ICD-10-CM

## 2019-09-11 DIAGNOSIS — J189 Pneumonia, unspecified organism: Secondary | ICD-10-CM | POA: Diagnosis present

## 2019-09-11 DIAGNOSIS — Z7902 Long term (current) use of antithrombotics/antiplatelets: Secondary | ICD-10-CM

## 2019-09-11 DIAGNOSIS — Z20828 Contact with and (suspected) exposure to other viral communicable diseases: Secondary | ICD-10-CM | POA: Diagnosis present

## 2019-09-11 DIAGNOSIS — R0603 Acute respiratory distress: Secondary | ICD-10-CM

## 2019-09-11 DIAGNOSIS — Z7901 Long term (current) use of anticoagulants: Secondary | ICD-10-CM

## 2019-09-11 LAB — BASIC METABOLIC PANEL
Anion gap: 11 (ref 5–15)
Anion gap: 9 (ref 5–15)
BUN: 13 mg/dL (ref 8–23)
BUN: 17 mg/dL (ref 8–23)
CO2: 19 mmol/L — ABNORMAL LOW (ref 22–32)
CO2: 21 mmol/L — ABNORMAL LOW (ref 22–32)
Calcium: 9.2 mg/dL (ref 8.9–10.3)
Calcium: 9.4 mg/dL (ref 8.9–10.3)
Chloride: 109 mmol/L (ref 98–111)
Chloride: 104 mmol/L (ref 98–111)
Creatinine, Ser: 1.69 mg/dL — ABNORMAL HIGH (ref 0.61–1.24)
Creatinine, Ser: 1.58 mg/dL — ABNORMAL HIGH (ref 0.61–1.24)
GFR calc Af Amer: 50 mL/min — ABNORMAL LOW (ref >60)
GFR calc Af Amer: 54 mL/min — ABNORMAL LOW (ref >60)
GFR calc non Af Amer: 43 mL/min — ABNORMAL LOW (ref >60)
GFR calc non Af Amer: 47 mL/min — ABNORMAL LOW (ref >60)
Glucose, Bld: 181 mg/dL — ABNORMAL HIGH (ref 70–99)
Glucose, Bld: 189 mg/dL — ABNORMAL HIGH (ref 70–99)
Potassium: 4.2 mmol/L (ref 3.5–5.1)
Potassium: 4.2 mmol/L (ref 3.5–5.1)
Sodium: 139 mmol/L (ref 135–145)
Sodium: 134 mmol/L — ABNORMAL LOW (ref 135–145)

## 2019-09-11 LAB — CBC WITH DIFFERENTIAL/PLATELET
Abs Immature Granulocytes: 0.04 10*3/uL (ref 0.00–0.07)
Basophils Absolute: 0.1 10*3/uL (ref 0.0–0.1)
Basophils Relative: 1 %
Eosinophils Absolute: 0.4 10*3/uL (ref 0.0–0.5)
Eosinophils Relative: 4 %
HCT: 46.6 % (ref 39.0–52.0)
Hemoglobin: 15 g/dL (ref 13.0–17.0)
Immature Granulocytes: 0 %
Lymphocytes Relative: 38 %
Lymphs Abs: 4 10*3/uL (ref 0.7–4.0)
MCH: 30.2 pg (ref 26.0–34.0)
MCHC: 32.2 g/dL (ref 30.0–36.0)
MCV: 93.8 fL (ref 80.0–100.0)
Monocytes Absolute: 0.5 10*3/uL (ref 0.1–1.0)
Monocytes Relative: 4 %
Neutro Abs: 5.5 10*3/uL (ref 1.7–7.7)
Neutrophils Relative %: 53 %
Platelets: 187 10*3/uL (ref 150–400)
RBC: 4.97 MIL/uL (ref 4.22–5.81)
RDW: 17.5 % — ABNORMAL HIGH (ref 11.5–15.5)
WBC: 10.5 10*3/uL (ref 4.0–10.5)
nRBC: 0 % (ref 0.0–0.2)

## 2019-09-11 LAB — TSH: TSH: 100.957 u[IU]/mL — ABNORMAL HIGH (ref 0.350–4.500)

## 2019-09-11 LAB — TROPONIN I (HIGH SENSITIVITY): Troponin I (High Sensitivity): 11 ng/L (ref <18)

## 2019-09-11 LAB — SARS CORONAVIRUS 2 BY RT PCR (HOSPITAL ORDER, PERFORMED IN ~~LOC~~ HOSPITAL LAB): SARS Coronavirus 2: NEGATIVE

## 2019-09-11 LAB — MAGNESIUM: Magnesium: 2.4 mg/dL (ref 1.7–2.4)

## 2019-09-11 LAB — BRAIN NATRIURETIC PEPTIDE: B Natriuretic Peptide: 764 pg/mL — ABNORMAL HIGH (ref 0.0–100.0)

## 2019-09-11 MED ORDER — LEVOTHYROXINE SODIUM 100 MCG/5ML IV SOLN
62.5000 ug | Freq: Every day | INTRAVENOUS | Status: DC
  Administered 2019-09-12: 05:00:00 62.5 ug via INTRAVENOUS
  Filled 2019-09-11 (×2): qty 5

## 2019-09-11 MED ORDER — IPRATROPIUM-ALBUTEROL 0.5-2.5 (3) MG/3ML IN SOLN
3.0000 mL | RESPIRATORY_TRACT | Status: DC
  Administered 2019-09-11: 20:00:00 3 mL via RESPIRATORY_TRACT
  Filled 2019-09-11: qty 3

## 2019-09-11 MED ORDER — IPRATROPIUM-ALBUTEROL 0.5-2.5 (3) MG/3ML IN SOLN
3.0000 mL | Freq: Once | RESPIRATORY_TRACT | Status: AC
  Administered 2019-09-11: 09:00:00 3 mL via RESPIRATORY_TRACT
  Filled 2019-09-11: qty 3

## 2019-09-11 MED ORDER — SACUBITRIL-VALSARTAN 49-51 MG PO TABS
1.0000 | ORAL_TABLET | Freq: Two times a day (BID) | ORAL | Status: DC
  Administered 2019-09-12 – 2019-09-13 (×3): 1 via ORAL
  Filled 2019-09-11 (×3): qty 1

## 2019-09-11 MED ORDER — ACETAMINOPHEN 650 MG RE SUPP: 650.0000 mg | Freq: Four times a day (QID) | RECTAL | Status: DC | PRN

## 2019-09-11 MED ORDER — MAGNESIUM SULFATE 2 GM/50ML IV SOLN
2.0000 g | Freq: Once | INTRAVENOUS | Status: AC
  Administered 2019-09-11: 09:00:00 2 g via INTRAVENOUS
  Filled 2019-09-11: qty 50

## 2019-09-11 MED ORDER — ACETAMINOPHEN 325 MG PO TABS: 650.0000 mg | ORAL_TABLET | Freq: Four times a day (QID) | ORAL | Status: DC | PRN

## 2019-09-11 MED ORDER — ZOLPIDEM TARTRATE 5 MG PO TABS
5.0000 mg | ORAL_TABLET | Freq: Every evening | ORAL | Status: DC | PRN
  Administered 2019-09-11: 23:00:00 5 mg via ORAL
  Filled 2019-09-11: qty 1

## 2019-09-11 MED ORDER — GUAIFENESIN ER 600 MG PO TB12
1200.0000 mg | ORAL_TABLET | Freq: Two times a day (BID) | ORAL | Status: DC
  Administered 2019-09-11: 22:00:00 1200 mg via ORAL
  Administered 2019-09-12: 10:00:00 600 mg via ORAL
  Administered 2019-09-12 – 2019-09-13 (×2): 1200 mg via ORAL
  Filled 2019-09-11 (×5): qty 2

## 2019-09-11 MED ORDER — ADULT MULTIVITAMIN W/MINERALS CH
1.0000 | ORAL_TABLET | Freq: Every morning | ORAL | Status: DC
  Administered 2019-09-12 – 2019-09-13 (×2): 1 via ORAL
  Filled 2019-09-11 (×3): qty 1

## 2019-09-11 MED ORDER — LORAZEPAM 2 MG/ML IJ SOLN: 1.0000 mg | INTRAMUSCULAR | Status: DC | PRN

## 2019-09-11 MED ORDER — CARVEDILOL 3.125 MG PO TABS
3.1250 mg | ORAL_TABLET | Freq: Two times a day (BID) | ORAL | Status: DC
  Administered 2019-09-12 – 2019-09-13 (×3): 3.125 mg via ORAL
  Filled 2019-09-11 (×3): qty 1

## 2019-09-11 MED ORDER — RIVAROXABAN 15 MG PO TABS
15.0000 mg | ORAL_TABLET | Freq: Every day | ORAL | Status: DC
  Administered 2019-09-12: 15 mg via ORAL
  Filled 2019-09-11: qty 1

## 2019-09-11 MED ORDER — IPRATROPIUM-ALBUTEROL 0.5-2.5 (3) MG/3ML IN SOLN
3.0000 mL | Freq: Three times a day (TID) | RESPIRATORY_TRACT | Status: DC
  Administered 2019-09-12 – 2019-09-13 (×5): 3 mL via RESPIRATORY_TRACT
  Filled 2019-09-11 (×4): qty 3

## 2019-09-11 MED ORDER — SODIUM CHLORIDE 0.9% FLUSH: 3.0000 mL | INTRAVENOUS | Status: DC | PRN

## 2019-09-11 MED ORDER — ATORVASTATIN CALCIUM 40 MG PO TABS
80.0000 mg | ORAL_TABLET | Freq: Every day | ORAL | Status: DC
  Administered 2019-09-12: 18:00:00 80 mg via ORAL
  Filled 2019-09-11: qty 2

## 2019-09-11 MED ORDER — FUROSEMIDE 10 MG/ML IJ SOLN
40.0000 mg | Freq: Two times a day (BID) | INTRAMUSCULAR | Status: DC
  Administered 2019-09-11 – 2019-09-12 (×3): 40 mg via INTRAVENOUS
  Filled 2019-09-11 (×3): qty 4

## 2019-09-11 MED ORDER — VITAMIN B-1 100 MG PO TABS
100.0000 mg | ORAL_TABLET | Freq: Every day | ORAL | Status: DC
  Administered 2019-09-12 – 2019-09-13 (×2): 100 mg via ORAL
  Filled 2019-09-11 (×2): qty 1

## 2019-09-11 MED ORDER — SODIUM CHLORIDE 0.9 % IV SOLN
2.0000 g | Freq: Two times a day (BID) | INTRAVENOUS | Status: DC
  Administered 2019-09-11 – 2019-09-12 (×3): 2 g via INTRAVENOUS
  Filled 2019-09-11 (×4): qty 2

## 2019-09-11 MED ORDER — VANCOMYCIN HCL IN DEXTROSE 1-5 GM/200ML-% IV SOLN
1000.0000 mg | INTRAVENOUS | Status: AC
  Administered 2019-09-11 (×2): 1000 mg via INTRAVENOUS
  Filled 2019-09-11: qty 200

## 2019-09-11 MED ORDER — LEVOTHYROXINE SODIUM 50 MCG PO TABS: 125.0000 ug | ORAL_TABLET | Freq: Every day | ORAL | Status: DC

## 2019-09-11 MED ORDER — VANCOMYCIN HCL 1.25 G IV SOLR
1250.0000 mg | INTRAVENOUS | Status: DC
  Administered 2019-09-12: 1250 mg via INTRAVENOUS
  Filled 2019-09-11 (×2): qty 1250

## 2019-09-11 MED ORDER — BUPROPION HCL ER (SR) 150 MG PO TB12
300.0000 mg | ORAL_TABLET | ORAL | Status: DC
  Administered 2019-09-12: 10:00:00 300 mg via ORAL
  Filled 2019-09-11: qty 2

## 2019-09-11 MED ORDER — SODIUM CHLORIDE 0.9 % IV SOLN: 250.0000 mL | INTRAVENOUS | Status: DC | PRN

## 2019-09-11 MED ORDER — METHYLPREDNISOLONE SODIUM SUCC 125 MG IJ SOLR
125.0000 mg | Freq: Once | INTRAMUSCULAR | Status: AC
  Administered 2019-09-11: 09:00:00 125 mg via INTRAVENOUS
  Filled 2019-09-11: qty 2

## 2019-09-11 MED ORDER — BUPROPION HCL ER (SR) 150 MG PO TB12: 300.0000 mg | ORAL_TABLET | ORAL | Status: DC

## 2019-09-11 MED ORDER — BUPROPION HCL ER (SR) 150 MG PO TB12
150.0000 mg | ORAL_TABLET | Freq: Every day | ORAL | Status: DC
  Administered 2019-09-12 – 2019-09-13 (×2): 150 mg via ORAL
  Filled 2019-09-11 (×2): qty 1

## 2019-09-11 MED ORDER — IPRATROPIUM-ALBUTEROL 0.5-2.5 (3) MG/3ML IN SOLN: 3.0000 mL | RESPIRATORY_TRACT | Status: DC | PRN

## 2019-09-11 MED ORDER — METHYLPREDNISOLONE SODIUM SUCC 40 MG IJ SOLR
40.0000 mg | Freq: Four times a day (QID) | INTRAMUSCULAR | Status: DC
  Administered 2019-09-12 (×3): 40 mg via INTRAVENOUS
  Filled 2019-09-11 (×3): qty 1

## 2019-09-11 MED ORDER — FUROSEMIDE 10 MG/ML IJ SOLN
40.0000 mg | Freq: Once | INTRAMUSCULAR | Status: AC
  Administered 2019-09-11: 09:00:00 40 mg via INTRAVENOUS
  Filled 2019-09-11: qty 4

## 2019-09-11 MED ORDER — ONDANSETRON HCL 4 MG PO TABS: 4.0000 mg | ORAL_TABLET | Freq: Four times a day (QID) | ORAL | Status: DC | PRN

## 2019-09-11 MED ORDER — FOLIC ACID 1 MG PO TABS
1.0000 mg | ORAL_TABLET | Freq: Every day | ORAL | Status: DC
  Administered 2019-09-12 – 2019-09-13 (×2): 1 mg via ORAL
  Filled 2019-09-11 (×2): qty 1

## 2019-09-11 MED ORDER — MAGNESIUM SULFATE 2 GM/50ML IV SOLN
2.0000 g | Freq: Once | INTRAVENOUS | Status: AC
  Administered 2019-09-11: 23:00:00 2 g via INTRAVENOUS
  Filled 2019-09-11: qty 50

## 2019-09-11 MED ORDER — CLOPIDOGREL BISULFATE 75 MG PO TABS
75.0000 mg | ORAL_TABLET | Freq: Every day | ORAL | Status: DC
  Administered 2019-09-12 – 2019-09-13 (×2): 75 mg via ORAL
  Filled 2019-09-11 (×3): qty 1

## 2019-09-11 MED ORDER — PANTOPRAZOLE SODIUM 40 MG IV SOLR
40.0000 mg | Freq: Two times a day (BID) | INTRAVENOUS | Status: DC
  Administered 2019-09-11 – 2019-09-13 (×4): 40 mg via INTRAVENOUS
  Filled 2019-09-11 (×5): qty 40

## 2019-09-11 MED ORDER — SODIUM CHLORIDE 0.9% FLUSH
3.0000 mL | Freq: Two times a day (BID) | INTRAVENOUS | Status: DC
  Administered 2019-09-11 – 2019-09-13 (×3): 3 mL via INTRAVENOUS

## 2019-09-11 MED ORDER — ONDANSETRON HCL 4 MG/2ML IJ SOLN: 4.0000 mg | Freq: Four times a day (QID) | INTRAMUSCULAR | Status: DC | PRN

## 2019-09-11 NOTE — Progress Notes (Signed)
Pharmacy Antibiotic Note  Ezera Tambasco is a 63 y.o. male admitted on 09/11/2019 with pneumonia.  Pharmacy has been consulted for cefepime and vancomycin dosing.  Plan: Vancomycin 1250mg  IV every 24 hours.  Goal trough 15-20 mcg/mL. cefepime 2gm iv q12h  Height: 5\' 10"  (177.8 cm) Weight: 249 lb 1.9 oz (113 kg) IBW/kg (Calculated) : 73  No data recorded.  Recent Labs  Lab 09/11/19 0900  WBC 10.5  CREATININE 1.69*    Estimated Creatinine Clearance: 57.8 mL/min (A) (by C-G formula based on SCr of 1.69 mg/dL (H)).    No Known Allergies  Antimicrobials this admission: 11/1 cefepime >>  11/1 vancomycin >>   Microbiology results: 11/1 Covid 19 negative 11/1 MRSA PCR: sent   Thank you for allowing pharmacy to be a part of this patient's care.  Donna Christen Rogena Deupree 09/11/2019 11:05 AM

## 2019-09-11 NOTE — Progress Notes (Signed)
Tele called. Stated patient has ST elevation and prolonged QTC. No c/o chest pain or dyspnea.  No diaphoresis. No N/V.  EKG to be done.  MD notified.

## 2019-09-11 NOTE — Progress Notes (Signed)
Patient is refusing the use of CPAP for the night stating he does not always wear one at home. RT educated patient and patient is aware to have RN contact RT if he changes his mind.

## 2019-09-11 NOTE — Plan of Care (Signed)
  Problem: Clinical Measurements: Goal: Ability to maintain clinical measurements within normal limits will improve Outcome: Progressing   Problem: Clinical Measurements: Goal: Respiratory complications will improve Outcome: Progressing   Problem: Clinical Measurements: Goal: Diagnostic test results will improve Outcome: Progressing   Problem: Clinical Measurements: Goal: Diagnostic test results will improve Outcome: Progressing   Problem: Safety: Goal: Ability to remain free from injury will improve Outcome: Progressing

## 2019-09-11 NOTE — H&P (Signed)
History and Physical  Tony Alvarez [id number] DOB: 12/22/1955 DOA: 09/11/2019  Referring physician: Lacinda Axon MD   PCP: [facility]   Chief Complaint: SOB   HPI: Tony Alvarez is a 63 y.o. male with CAD, ischemic cardiomyopathy, COPD, OSA, hypertension, hyperlipidemia, CHF, chronic alcoholism with prior withdrawal, Barretts esophagus, hypothyroidism and depression presented to ED by EMS with severe acute respiratory distress.  Pt was so short of breath that he was not able to speak in full sentences or catch his breath. The symptoms apparently started early this morning.  He was very tachypneic on arrival.  He denies chest pain.  He was immediately placed on bipap therapy and started to improve.  He was noted to have diffuse wheezing.  His chest xray was positive for pulmonary edema, pneumonia and emphysema.   He was given IV lasix, IV steroids and started on IV antibiotics and supportive therapy.  His CoVID 19 test was negative. After discussing with ED provider Dr. Lacinda Axon he is being admitted to stepdown ICU.    Review of Systems: UTO due to current condition on bipap  Past Medical History:  Diagnosis Date   Barrett's esophagus 04/21/2018   Depression    High cholesterol    Hypertension    Hypothyroid    Sleep apnea    wears CPAP at home   Past Surgical History:  Procedure Laterality Date   BIOPSY  05/24/2018   Procedure: BIOPSY;  Surgeon: Rogene Houston, MD;  [facility];  Service: Endoscopy;;  gastric   COLONOSCOPY     COLONOSCOPY N/A 05/24/2018   Procedure: COLONOSCOPY;  Surgeon: Rogene Houston, MD;  [facility];  Service: Endoscopy;  Laterality: N/A;  8:55   COLONOSCOPY WITH ESOPHAGOGASTRODUODENOSCOPY (EGD)     CORONARY STENT INTERVENTION N/A 01/26/2019   Procedure: CORONARY STENT INTERVENTION;  Surgeon: Troy Sine, MD;  Location: [place] [facility];  Service: Cardiovascular;  Laterality: N/A;   CORONARY/GRAFT ACUTE MI  REVASCULARIZATION N/A 01/26/2019   Procedure: Coronary/Graft Acute MI Revascularization;  Surgeon: Troy Sine, MD;  Location: [place] [facility];  Service: Cardiovascular;  Laterality: N/A;   ESOPHAGOGASTRODUODENOSCOPY N/A 05/24/2018   Procedure: ESOPHAGOGASTRODUODENOSCOPY (EGD);  Surgeon: Rogene Houston, MD;  [facility];  Service: Endoscopy;  Laterality: N/A;   LEFT HEART CATH AND CORONARY ANGIOGRAPHY N/A 01/26/2019   Procedure: LEFT HEART CATH AND CORONARY ANGIOGRAPHY;  Surgeon: Troy Sine, MD;  Location: [place] [facility];  Service: Cardiovascular;  Laterality: N/A;   MEDIAL PARTIAL KNEE REPLACEMENT     2015   POLYPECTOMY  05/24/2018   Procedure: POLYPECTOMY;  Surgeon: Rogene Houston, MD;  [facility];  Service: Endoscopy;;  colon   rt hand injury     shoulder surgery for bursitis     TONSILLECTOMY     Social History:  reports that he has been smoking cigarettes. He has a 10.00 pack-year smoking history. He has never used smokeless tobacco. He reports current alcohol use. He reports that he does not use drugs.  No Known Allergies  Family History  Problem Relation Age of Onset   Colon cancer Neg Hx     Prior to Admission medications   Medication Sig Start Date End Date Taking? Authorizing Provider  acetaminophen (TYLENOL) 500 MG tablet Take 500-1,000 mg by mouth daily as needed for mild pain or moderate pain.    [provider]  albuterol (VENTOLIN HFA) 108 (90  Base) MCG/ACT inhaler Inhale 2 puffs into the lungs every 6 (six) hours as needed for wheezing or shortness of breath. 06/23/19   Laurin Coder, MD  atorvastatin (LIPITOR) 80 MG tablet Take 1 tablet (80 mg total) by mouth daily at 6 PM. 01/30/19   Almyra Deforest, PA  buPROPion [facility] SR) 150 MG 12 hr tablet Take 150-300 mg by mouth See admin instructions. Take two tablets (300 mg) by mouth every morning and one tablet (150 mg) with lunch 03/27/18   [provider]    carvedilol (COREG) 3.125 MG tablet Take 1 tablet (3.125 mg total) by mouth 2 (two) times daily with a meal. 01/30/19   Almyra Deforest, PA  clopidogrel (PLAVIX) 75 MG tablet Take 1 tablet (75 mg total) by mouth daily. 01/31/19   Almyra Deforest, PA  furosemide (LASIX) 20 MG tablet Take 1 tablet (20 mg total) by mouth as needed for edema. 08/29/19   Troy Sine, MD  levothyroxine (SYNTHROID) 125 MCG tablet Take 125 mcg by mouth as needed.    [provider]  Multiple Vitamin (MULTIVITAMIN WITH MINERALS) TABS tablet Take 1 tablet by mouth every morning.    [provider]  nitroGLYCERIN (NITROSTAT) 0.4 MG SL tablet Place 1 tablet (0.4 mg total) under the tongue every 5 (five) minutes x 3 doses as needed for chest pain. 01/30/19   Almyra Deforest, PA  rivaroxaban (XARELTO) 20 MG TABS tablet Take 1 tablet (20 mg total) by mouth daily. 01/31/19   Almyra Deforest, PA  sacubitril-valsartan (ENTRESTO) 49-51 MG Take 1 tablet by mouth 2 (two) times daily. 07/08/19   Troy Sine, MD  spironolactone (ALDACTONE) 25 MG tablet Take 0.5 tablets (12.5 mg total) by mouth daily. 08/29/19   Troy Sine, MD   Physical Exam: Vitals:   09/11/19 1051 09/11/19 1052 09/11/19 1053 09/11/19 1054  BP:      Pulse: 87 88 89 88  Resp: 12 14 16 13   SpO2: 98% 97% 98% 97%  Weight:      Height:         General exam: Pt on bipap, he appears chronically and acutely ill.  Moderately built and nourished patient, lying supine on the gurney in no obvious distress.  Head, eyes and ENT: Nontraumatic and normocephalic. Pupils equally reacting to light and accommodation. Oral mucosa moist.  Neck: Supple. No JVD, carotid bruit or thyromegaly.  Lymphatics: No lymphadenopathy.  Respiratory system: diffuse rhonchi, bibasilar crackles. Moderate increased work of breathing.  Cardiovascular system: normal S1 and S2 heard, RRR. No JVD, murmurs, gallops, clicks or pedal edema.  Gastrointestinal system: Abdomen is nondistended, soft  and nontender. Normal bowel sounds heard. No organomegaly or masses appreciated.  Central nervous system: Alert and oriented. No focal neurological deficits.  Extremities: 1+ edema BLEs.  Symmetric 5 x 5 power. Peripheral pulses symmetrically felt.   Skin: No rashes or acute findings.  Musculoskeletal system: Negative exam.  Psychiatry: Pleasant and cooperative.  Labs on Admission:  Basic Metabolic Panel: Recent Labs  Lab 09/11/19 0900  NA 139  K 4.2  CL 109  CO2 19*  GLUCOSE 181*  BUN 13  CREATININE 1.69*  CALCIUM 9.2   Liver Function Tests: No results for input(s): AST, ALT, ALKPHOS, BILITOT, PROT, ALBUMIN in the last 168 hours. No results for input(s): LIPASE, AMYLASE in the last 168 hours. No results for input(s): AMMONIA in the last 168 hours. CBC: Recent Labs  Lab 09/11/19 0900  WBC 10.5  NEUTROABS  5.5  HGB 15.0  HCT 46.6  MCV 93.8  PLT 187   Cardiac Enzymes: No results for input(s): CKTOTAL, CKMB, CKMBINDEX, TROPONINI in the last 168 hours.  BNP (last 3 results) Recent Labs    07/26/19 1102  PROBNP 992*   CBG: No results for input(s): GLUCAP in the last 168 hours.  Radiological Exams on Admission: Dg Chest Port 1 View  Result Date: 09/11/2019 CLINICAL DATA:  Shortness of breath. EXAM: PORTABLE CHEST 1 VIEW COMPARISON:  Chest x-rays dated 06/24/2019 and 05/17/2019. FINDINGS: New interstitial opacities within the mid and lower lung zones bilaterally. Stable mild cardiomegaly. Coarse lung markings at the lung apices suggest chronic interstitial lung disease and/or emphysematous change. IMPRESSION: 1. New interstitial opacities within the mid and lower lung zones bilaterally, compatible with either CHF/volume overload or atypical pneumonia, likely superimposed on chronic interstitial lung disease/emphysema. 2. Stable mild cardiomegaly. Electronically Signed   By: Franki Cabot M.D.   On: 09/11/2019 10:20   EKG: personally reviewed:  NSR  Assessment/Plan Principal Problem:   Acute and chronic respiratory failure (acute-on-chronic) (HCC) Active Problems:   Barrett's esophagus   Hyperlipidemia   Alcohol abuse   Ischemic cardiomyopathy   Acute on chronic systolic CHF (congestive heart failure) (HCC)   CAD (coronary artery disease)   Hypothyroid   COPD (chronic obstructive pulmonary disease) (HCC)   Sleep apnea   Hypertension   AKI (acute kidney injury) ([place])  1. Acute on chronic respiratory failure - at this time seems to be multifactorial causes including acute CHF exacerbation, pneumonia and COPD exacerbation which all are being addressed. Continue IV lasix for diuresis, IV steroids and antibiotics, scheduled bronchodilators, supportive care, bipap therapy, admit to stepdown ICU.  2. AKI - bump in creatine likely secondary to poor perfusion due to CHF, continue IV lasix and follow creatinine closely, renally dose meds as appropriate.  3. COPD exacerbation - continue scheduled bronchodilators, IV steroids and IV antibiotics.  4. CAP - continue IV antibiotics and de-escalate as soon as possible.   5. OSA - continue nightly CPAP if not already on bipap.  6. Ischemic cardiomyopathy with acute systolic CHF exacerbation - continue cardiac medications, continue IV lasix, follow I/O, weights and lytes.  Follow chest xray.   7. CAD - resume home cardiac medications.  8. Chronic alcoholism - ICU CIWA protocol in place.   DVT Prophylaxis: xarelto  Code Status: Full   Family Communication:   Disposition Plan: stepdown ICU    Critical Care Procedure Note Authorized and Performed by: Murvin Natal MD  Total Critical Care time:  55 minutes  Due to a high probability of clinically significant, life threatening deterioration, the patient required my highest level of preparedness to intervene emergently and I personally spent this critical care time directly and personally managing the patient.  This critical care time included  obtaining a history; examining the patient, pulse oximetry; ordering and review of studies; arranging urgent treatment with development of a management plan; evaluation of patient's response of treatment; frequent reassessment; and discussions with other providers.  This critical care time was performed to assess and manage the high probability of imminent and life threatening deterioration that could result in multi-organ failure.  It was exclusive of separately billable procedures and treating other patients and teaching time.   Irwin Brakeman, MD [facility] How to contact the [facility] Attending or Consulting provider [place] or covering provider during after hours [place], for this patient?  1. Check the care  team in [facility] and look for a) attending/consulting [place] provider listed and b) the [facility] team listed 2. Log into [email or web address] and use [place]'s universal password to access. If you do not have the password, please contact the hospital operator. 3. Locate the [facility] provider you are looking for under [facility] and page to a number that you can be directly reached. 4. If you still have difficulty reaching the provider, please page the [facility] (Director on Call) for the Hospitalists listed on amion for assistance.

## 2019-09-11 NOTE — Progress Notes (Signed)
[facility] night shift.  The staff received information from the patient's cardiac telemetry showing ST elevation and prolonged QT. He was asymptomatic. No new changes on EKG. Troponin was normal. He is on Xarelto so concern for ischemic issue is low. Magnesium sulfate given. Potassium level is normal, but will follow closely in the setting of IV furosemide use.  Tennis Must, MD

## 2019-09-11 NOTE — Progress Notes (Signed)
Dr. Olevia Bowens notified that new EKG was completed.  Patient to have labs drawn and given IV magnesium.

## 2019-09-11 NOTE — ED Notes (Signed)
Waiting for meds to be verified

## 2019-09-11 NOTE — ED Triage Notes (Signed)
Pt reports shortness of breath starting this am. Pt reports used at home neb with no relief. Pt alert, tachypneIC, accessory muscle use.

## 2019-09-11 NOTE — ED Provider Notes (Addendum)
[facility] EMERGENCY DEPARTMENT Provider Note   CSN: [id number] Arrival date & time: 09/11/19  [id number]     History   Chief Complaint Chief Complaint  Patient presents with  . Shortness of Breath    HPI Tony Alvarez is a 63 y.o. male.     Level 5 caveat for acuity of condition.  Chief complaint extreme dyspnea.  Symptoms started this morning.  Patient uses home nebulizer with minimal relief.  He is using accessory muscles to breathe.  Past medical history includes CAD, COPD, hypothyroidism, depression, hyperlipidemia, alcohol abuse, CHF, Barrett's esophagus, many others.     Past Medical History:  Diagnosis Date  . Barrett's esophagus 04/21/2018  . Depression   . High cholesterol   . Hypertension   . Hypothyroid   . Sleep apnea    wears CPAP at home    Patient Active Problem List   Diagnosis Date Noted  . Acute and chronic respiratory failure (acute-on-chronic) ([place]) 09/11/2019  . Sleep apnea   . Hypertension   . AKI (acute kidney injury) ([place])   . CAD (coronary artery disease) 06/24/2019  . Hypothyroid   . COPD (chronic obstructive pulmonary disease) ([place])   . Depression   . Hyperlipidemia 01/30/2019  . LV (left ventricular) mural thrombus with acute MI ([place]) 01/30/2019  . Alcohol abuse 01/30/2019  . Alcohol withdrawal delirium ([place]) 01/30/2019  . Elevated liver enzymes 01/30/2019  . Ischemic cardiomyopathy 01/30/2019  . Acute on chronic systolic CHF (congestive heart failure) ([place]) 01/30/2019  . History of colonic polyps 04/21/2018  . Barrett's esophagus 04/21/2018  . Barrett's esophagus without dysplasia 04/21/2018  . Rectal bleeding 04/21/2018    Past Surgical History:  Procedure Laterality Date  . BIOPSY  05/24/2018   Procedure: BIOPSY;  Surgeon: Rogene Houston, MD;  [facility];  Service: Endoscopy;;  gastric  . COLONOSCOPY    . COLONOSCOPY N/A 05/24/2018   Procedure: COLONOSCOPY;  Surgeon: Rogene Houston, MD;  Location: [facility];   Service: Endoscopy;  Laterality: N/A;  8:55  . COLONOSCOPY WITH ESOPHAGOGASTRODUODENOSCOPY (EGD)    . CORONARY STENT INTERVENTION N/A 01/26/2019   Procedure: CORONARY STENT INTERVENTION;  Surgeon: Troy Sine, MD;  Location: [place] [facility];  Service: Cardiovascular;  Laterality: N/A;  . CORONARY/GRAFT ACUTE MI REVASCULARIZATION N/A 01/26/2019   Procedure: Coronary/Graft Acute MI Revascularization;  Surgeon: Troy Sine, MD;  Location: [place] [facility];  Service: Cardiovascular;  Laterality: N/A;  . ESOPHAGOGASTRODUODENOSCOPY N/A 05/24/2018   Procedure: ESOPHAGOGASTRODUODENOSCOPY (EGD);  Surgeon: Rogene Houston, MD;  [facility];  Service: Endoscopy;  Laterality: N/A;  . LEFT HEART CATH AND CORONARY ANGIOGRAPHY N/A 01/26/2019   Procedure: LEFT HEART CATH AND CORONARY ANGIOGRAPHY;  Surgeon: Troy Sine, MD;  Location: [place] [facility];  Service: Cardiovascular;  Laterality: N/A;  . MEDIAL PARTIAL KNEE REPLACEMENT     2015  . POLYPECTOMY  05/24/2018   Procedure: POLYPECTOMY;  Surgeon: Rogene Houston, MD;  [facility];  Service: Endoscopy;;  colon  . rt hand injury    . shoulder surgery for bursitis    . TONSILLECTOMY          Home Medications    Prior to Admission medications   Medication Sig Start Date End Date Taking? Authorizing Provider  acetaminophen (TYLENOL) 500 MG tablet Take 500-1,000 mg by mouth daily as needed for mild pain or moderate pain.    [provider]  albuterol (VENTOLIN  HFA) 108 (90 Base) MCG/ACT inhaler Inhale 2 puffs into the lungs every 6 (six) hours as needed for wheezing or shortness of breath. 06/23/19   Laurin Coder, MD  atorvastatin (LIPITOR) 80 MG tablet Take 1 tablet (80 mg total) by mouth daily at 6 PM. 01/30/19   Almyra Deforest, PA  buPROPion [facility] SR) 150 MG 12 hr tablet Take 150-300 mg by mouth See admin instructions. Take two tablets (300 mg) by mouth every morning and one tablet (150 mg) with  lunch 03/27/18   [provider]  carvedilol (COREG) 3.125 MG tablet Take 1 tablet (3.125 mg total) by mouth 2 (two) times daily with a meal. 01/30/19   Almyra Deforest, PA  clopidogrel (PLAVIX) 75 MG tablet Take 1 tablet (75 mg total) by mouth daily. 01/31/19   Almyra Deforest, PA  furosemide (LASIX) 20 MG tablet Take 1 tablet (20 mg total) by mouth as needed for edema. 08/29/19   Troy Sine, MD  levothyroxine (SYNTHROID) 125 MCG tablet Take 125 mcg by mouth as needed.    [provider]  Multiple Vitamin (MULTIVITAMIN WITH MINERALS) TABS tablet Take 1 tablet by mouth every morning.    [provider]  nitroGLYCERIN (NITROSTAT) 0.4 MG SL tablet Place 1 tablet (0.4 mg total) under the tongue every 5 (five) minutes x 3 doses as needed for chest pain. 01/30/19   Almyra Deforest, PA  rivaroxaban (XARELTO) 20 MG TABS tablet Take 1 tablet (20 mg total) by mouth daily. 01/31/19   Almyra Deforest, PA  sacubitril-valsartan (ENTRESTO) 49-51 MG Take 1 tablet by mouth 2 (two) times daily. 07/08/19   Troy Sine, MD  spironolactone (ALDACTONE) 25 MG tablet Take 0.5 tablets (12.5 mg total) by mouth daily. 08/29/19   Troy Sine, MD    Family History Family History  Problem Relation Age of Onset  . Colon cancer Neg Hx     Social History Social History   Tobacco Use  . Smoking status: Current Every Day Smoker    Packs/day: 0.25    Years: 40.00    Pack years: 10.00    Types: Cigarettes  . Smokeless tobacco: Never Used  . Tobacco comment: 7 cigarettes per day 05/25/19  Substance Use Topics  . Alcohol use: Yes    Frequency: Never    Comment: very rare  . Drug use: Never     Allergies   Patient has no known allergies.   Review of Systems Review of Systems  Unable to perform ROS: Acuity of condition     Physical Exam Updated Vital Signs BP 99/76   Pulse 86   Resp 11   Ht 5\' 10"  (1.778 m)   Wt 113 kg   SpO2 99%   BMI 35.74 kg/m   Physical Exam Vitals signs and nursing  note reviewed.  Constitutional:      Comments: In respiratory distress.  HENT:     Head: Normocephalic and atraumatic.  Eyes:     Conjunctiva/sclera: Conjunctivae normal.  Neck:     Musculoskeletal: Neck supple.  Cardiovascular:     Rate and Rhythm: Normal rate and regular rhythm.  Pulmonary:     Comments: Tachypneic, dyspneic, using accessory muscles, scattered rhonchi. Abdominal:     General: Bowel sounds are normal.     Palpations: Abdomen is soft.  Musculoskeletal: Normal range of motion.  Skin:    General: Skin is warm and dry.  Neurological:     General: No focal deficit present.  Mental Status: He is oriented to person, place, and time.  Psychiatric:        Behavior: Behavior normal.      ED Treatments / Results  Labs (all labs ordered are listed, but only abnormal results are displayed) Labs Reviewed  CBC WITH DIFFERENTIAL/PLATELET - Abnormal; Notable for the following components:      Result Value   RDW 17.5 (*)    All other components within normal limits  BASIC METABOLIC PANEL - Abnormal; Notable for the following components:   CO2 19 (*)    Glucose, Bld 181 (*)    Creatinine, Ser 1.69 (*)    GFR calc non Af Amer 43 (*)    GFR calc Af Amer 50 (*)    All other components within normal limits  BRAIN NATRIURETIC PEPTIDE - Abnormal; Notable for the following components:   B Natriuretic Peptide 764.0 (*)    All other components within normal limits  SARS CORONAVIRUS 2 BY RT PCR (HOSPITAL ORDER, [facility])  MRSA PCR SCREENING  TSH    EKG EKG Interpretation  Date/Time:  Sunday September 11 2019 09:22:35 EST Ventricular Rate:  94 PR Interval:    QRS Duration: 151 QT Interval:  441 QTC Calculation: 552 R Axis:   -86 Text Interpretation: Sinus rhythm Probable left atrial enlargement IVCD, consider atypical RBBB Anterolateral infarct, recent Confirmed by Kaavya Puskarich ([id number]) on 09/11/2019 12:33:07 PM   Radiology Dg Chest  Port 1 View  Result Date: 09/11/2019 CLINICAL DATA:  Shortness of breath. EXAM: PORTABLE CHEST 1 VIEW COMPARISON:  Chest x-rays dated 06/24/2019 and 05/17/2019. FINDINGS: New interstitial opacities within the mid and lower lung zones bilaterally. Stable mild cardiomegaly. Coarse lung markings at the lung apices suggest chronic interstitial lung disease and/or emphysematous change. IMPRESSION: 1. New interstitial opacities within the mid and lower lung zones bilaterally, compatible with either CHF/volume overload or atypical pneumonia, likely superimposed on chronic interstitial lung disease/emphysema. 2. Stable mild cardiomegaly. Electronically Signed   By: Stan  Maynard M.D.   On: 09/11/2019 10:20    Procedures Procedures (including critical care time)  Medications Ordered in ED Medications  vancomycin (VANCOCIN) IVPB 1000 mg/200 mL premix (1,000 mg Intravenous New Bag/Given 09/11/19 1153)  ceFEPIme (MAXIPIME) 2 g in sodium chloride 0.9 % 100 mL IVPB (2 g Intravenous New Bag/Given 09/11/19 1147)  Vancomycin (VANCOCIN) 1,250 mg in sodium chloride 0.9 % 250 mL IVPB (has no administration in time range)  methylPREDNISolone sodium succinate (SOLU-MEDROL) 125 mg/2 mL injection 125 mg (125 mg Intravenous Given 09/11/19 0915)  magnesium sulfate IVPB 2 g 50 mL (0 g Intravenous Stopped 09/11/19 1014)  ipratropium-albuterol (DUONEB) 0.5-2.5 (3) MG/3ML nebulizer solution 3 mL (3 mLs Nebulization Given 09/11/19 0922)  furosemide (LASIX) injection 40 mg (40 mg Intravenous Given 09/11/19 0915)     Initial Impression / Assessment and Plan / ED Course  I have reviewed the triage vital signs and the nursing notes.  Pertinent labs & imaging results that were available during my care of the patient were reviewed by me and considered in my medical decision making (see chart for details).       Suspect COPD exacerbation versus Covid versus pneumonia versus CHF.  Will initiate BiPAP, chest x-ray, IV steroids,  nebulizer treatment, admission  1045: Chest x-ray reviewed.  Suspect pneumonia versus pulmonary edema.  Will initiate vancomycin and Maxipime.      11 15: Discussed with Dr. Wynetta Emery.  CRITICAL CARE Performed by: Nat Christen Total  critical care time: 35 minutes Critical care time was exclusive of separately billable procedures and treating other patients. Critical care was necessary to treat or prevent imminent or life-threatening deterioration. Critical care was time spent personally by me on the following activities: development of treatment plan with patient and/or surrogate as well as nursing, discussions with consultants, evaluation of patient's response to treatment, examination of patient, obtaining history from patient or surrogate, ordering and performing treatments and interventions, ordering and review of laboratory studies, ordering and review of radiographic studies, pulse oximetry and re-evaluation of patient's condition.   Final Clinical Impressions(s) / ED Diagnoses   Final diagnoses:  Respiratory distress    ED Discharge Orders    None       Nat Christen, MD 09/11/19 [id number]    Nat Christen, MD 09/11/19 1111    Nat Christen, MD 09/11/19 1233

## 2019-09-11 NOTE — ED Notes (Signed)
Removed from the bipap at this time and placed on 3lpm [place]. The patient is comfortable and resting right now.

## 2019-09-12 LAB — CBC WITH DIFFERENTIAL/PLATELET
Abs Immature Granulocytes: 0.08 10*3/uL — ABNORMAL HIGH (ref 0.00–0.07)
Basophils Absolute: 0 10*3/uL (ref 0.0–0.1)
Basophils Relative: 0 %
Eosinophils Absolute: 0 10*3/uL (ref 0.0–0.5)
Eosinophils Relative: 0 %
HCT: 44.1 % (ref 39.0–52.0)
Hemoglobin: 14.4 g/dL (ref 13.0–17.0)
Immature Granulocytes: 1 %
Lymphocytes Relative: 8 %
Lymphs Abs: 1.1 10*3/uL (ref 0.7–4.0)
MCH: 30.2 pg (ref 26.0–34.0)
MCHC: 32.7 g/dL (ref 30.0–36.0)
MCV: 92.5 fL (ref 80.0–100.0)
Monocytes Absolute: 0.4 10*3/uL (ref 0.1–1.0)
Monocytes Relative: 2 %
Neutro Abs: 12.8 10*3/uL — ABNORMAL HIGH (ref 1.7–7.7)
Neutrophils Relative %: 89 %
Platelets: 166 10*3/uL (ref 150–400)
RBC: 4.77 MIL/uL (ref 4.22–5.81)
RDW: 16.9 % — ABNORMAL HIGH (ref 11.5–15.5)
WBC: 14.3 10*3/uL — ABNORMAL HIGH (ref 4.0–10.5)
nRBC: 0 % (ref 0.0–0.2)

## 2019-09-12 LAB — T4, FREE: Free T4: <0.25 ng/dL — ABNORMAL LOW (ref 0.61–1.12)

## 2019-09-12 LAB — COMPREHENSIVE METABOLIC PANEL
ALT: 21 U/L (ref 0–44)
AST: 22 U/L (ref 15–41)
Albumin: 4.2 g/dL (ref 3.5–5.0)
Alkaline Phosphatase: 57 U/L (ref 38–126)
Anion gap: 15 (ref 5–15)
BUN: 17 mg/dL (ref 8–23)
CO2: 21 mmol/L — ABNORMAL LOW (ref 22–32)
Calcium: 10.1 mg/dL (ref 8.9–10.3)
Chloride: 104 mmol/L (ref 98–111)
Creatinine, Ser: 1.51 mg/dL — ABNORMAL HIGH (ref 0.61–1.24)
GFR calc Af Amer: 57 mL/min — ABNORMAL LOW (ref >60)
GFR calc non Af Amer: 49 mL/min — ABNORMAL LOW (ref >60)
Glucose, Bld: 160 mg/dL — ABNORMAL HIGH (ref 70–99)
Potassium: 4.4 mmol/L (ref 3.5–5.1)
Sodium: 140 mmol/L (ref 135–145)
Total Bilirubin: 1.7 mg/dL — ABNORMAL HIGH (ref 0.3–1.2)
Total Protein: 7.2 g/dL (ref 6.5–8.1)

## 2019-09-12 LAB — MAGNESIUM: Magnesium: 2.7 mg/dL — ABNORMAL HIGH (ref 1.7–2.4)

## 2019-09-12 LAB — BRAIN NATRIURETIC PEPTIDE: B Natriuretic Peptide: 758 pg/mL — ABNORMAL HIGH (ref 0.0–100.0)

## 2019-09-12 LAB — HEMOGLOBIN A1C
Hgb A1c MFr Bld: 6.7 % — ABNORMAL HIGH (ref 4.8–5.6)
Mean Plasma Glucose: 145.59 mg/dL

## 2019-09-12 LAB — TROPONIN I (HIGH SENSITIVITY): Troponin I (High Sensitivity): 11 ng/L (ref <18)

## 2019-09-12 MED ORDER — METHYLPREDNISOLONE SODIUM SUCC 40 MG IJ SOLR: 40.0000 mg | Freq: Two times a day (BID) | INTRAMUSCULAR | Status: DC

## 2019-09-12 MED ORDER — METHYLPREDNISOLONE SODIUM SUCC 40 MG IJ SOLR
40.0000 mg | Freq: Two times a day (BID) | INTRAMUSCULAR | Status: DC
  Administered 2019-09-12 – 2019-09-13 (×2): 40 mg via INTRAVENOUS
  Filled 2019-09-12 (×2): qty 1

## 2019-09-12 MED ORDER — SODIUM CHLORIDE 0.9 % IV SOLN
2.0000 g | INTRAVENOUS | Status: DC
  Administered 2019-09-12: 18:00:00 2 g via INTRAVENOUS
  Filled 2019-09-12: qty 20

## 2019-09-12 MED ORDER — DOXYCYCLINE HYCLATE 100 MG PO TABS
100.0000 mg | ORAL_TABLET | Freq: Two times a day (BID) | ORAL | Status: DC
  Administered 2019-09-12 – 2019-09-13 (×2): 100 mg via ORAL
  Filled 2019-09-12 (×2): qty 1

## 2019-09-12 MED ORDER — RIVAROXABAN 20 MG PO TABS
20.0000 mg | ORAL_TABLET | Freq: Every day | ORAL | Status: DC
  Administered 2019-09-13: 20 mg via ORAL
  Filled 2019-09-12: qty 1

## 2019-09-12 MED ORDER — SODIUM CHLORIDE 0.9 % IV SOLN: 1.0000 g | INTRAVENOUS | Status: DC

## 2019-09-12 MED ORDER — LEVOTHYROXINE SODIUM 25 MCG PO TABS
125.0000 ug | ORAL_TABLET | Freq: Every day | ORAL | Status: DC
  Administered 2019-09-13: 125 ug via ORAL
  Filled 2019-09-12 (×2): qty 1

## 2019-09-12 NOTE — Progress Notes (Signed)
Patient Demographics:    Tony Alvarez, is a 63 y.o. male, DOB - 05-19-1956, [id number]  Admit date - 09/11/2019   Admitting Physician Murlean Iba, MD  Outpatient Primary MD for the patient is A, [place] [facility]  LOS - 1   Chief Complaint  Patient presents with  . Shortness of Breath        Subjective:    Tony Alvarez today has no fevers, no emesis,  No chest pain,   -Cough and shortness of breath is not worse -Voiding okay -Urine clearer, no frank hematuria  Assessment  & Plan :    Principal Problem:   Acute and chronic respiratory failure (acute-on-chronic) (HCC) Active Problems:   Barrett's esophagus   Hyperlipidemia   Alcohol abuse   Ischemic cardiomyopathy   Acute on chronic systolic CHF (congestive heart failure) (HCC)   CAD (coronary artery disease)   Hypothyroid   COPD (chronic obstructive pulmonary disease) (HCC)   Sleep apnea   Hypertension   AKI (acute kidney injury) ([place])  Brief Summary Tony Alvarez is a 63 y.o. male with CAD, ischemic cardiomyopathy, COPD, OSA, hypertension, hyperlipidemia, CHF, chronic alcoholism with prior withdrawal, Barretts esophagus, hypothyroidism and depression admitted on 09/11/2019 with acute respiratory distress presumed secondary to combination of CHF and underlying respiratory infection/pneumonia   A/p  1)HFrEF--patient with acute on chronic combined diastolic and systolic dysfunction CHF exacerbation, last known EF 25 to 30% based on echo from 08/17/2019 -Patient had underlying CAD and  ischemic cardiomyopathy -Continue Coreg 3.125 mg twice daily, Lipitor 80 mg daily, Plavix 75 mg daily, -Continue Lasix 40 mg IV twice daily, along with Entresto 1 tablet twice daily  2) COPD exacerbation/pneumonia--cough and shortness of breath continues to improve, no fevers, okay to de-escalate antibiotics, stop Vanco and cefepime, treat  empirically with IV Rocephin and p.o. doxycycline, -Continue mucolytics and bronchodilators  3)AKI----acute kidney injury -due to decreased renal perfusion    creatinine on admission=1.69  ,   baseline creatinine =1.1    , creatinine is now=  1.51    , renally adjust medications, avoid nephrotoxic agents/dehydration/hypotension -Continue to watch renal function closely with Entresto and Lasix use   4)Obesity/OSA--- stable, continue to advise CPAP nightly  5)Chronic alcoholism--- continue multivitamin and lorazepam per CIWA protocol  6)Hypothyroidism-stable, continue levothyroxine  7) history of left ventricular mural thrombus--on  chronic anticoagulation-continue Xarelto,  Disposition/Need for in-Hospital Stay- patient unable to be discharged at this time due to --- acute on chronic combined CHF flareup requiring IV diuresis  Code Status : full  Family Communication:   NA (patient is alert, awake and coherent)   Disposition Plan  : TBD  Consults  :  na  DVT Prophylaxis  : Xarelto- SCDs   Lab Results  Component Value Date   PLT 166 09/12/2019    Inpatient Medications  Scheduled Meds: . atorvastatin  80 mg Oral q1800  . buPROPion  150 mg Oral Daily  . buPROPion  300 mg Oral See admin instructions  . carvedilol  3.125 mg Oral BID WC  . clopidogrel  75 mg Oral Daily  . doxycycline  100 mg Oral BID  . folic acid  1 mg Oral Daily  . furosemide  40 mg Intravenous  Q12H  . guaiFENesin  1,200 mg Oral BID  . ipratropium-albuterol  3 mL Nebulization TID  . levothyroxine  62.5 mcg Intravenous Daily  . [START ON 09/13/2019] methylPREDNISolone (SOLU-MEDROL) injection  40 mg Intravenous Q12H  . multivitamin with minerals  1 tablet Oral q morning - 10a  . pantoprazole (PROTONIX) IV  40 mg Intravenous Q12H  . [START ON 09/13/2019] rivaroxaban  20 mg Oral Daily  . sacubitril-valsartan  1 tablet Oral BID  . sodium chloride flush  3 mL Intravenous Q12H  . thiamine  100 mg Oral Daily    Continuous Infusions: . sodium chloride    . cefTRIAXone (ROCEPHIN)  IV     PRN Meds:.sodium chloride, acetaminophen **OR** acetaminophen, ipratropium-albuterol, LORazepam, ondansetron **OR** ondansetron (ZOFRAN) IV, sodium chloride flush, zolpidem    Anti-infectives (From admission, onward)   Start     Dose/Rate Route Frequency Ordered Stop   09/12/19 2200  doxycycline (VIBRA-TABS) tablet 100 mg     100 mg Oral 2 times daily 09/12/19 1709     09/12/19 1715  cefTRIAXone (ROCEPHIN) 1 g in sodium chloride 0.9 % 100 mL IVPB     1 g 200 mL/hr over 30 Minutes Intravenous Every 24 hours 09/12/19 1709     09/12/19 1100  Vancomycin (VANCOCIN) 1,250 mg in sodium chloride 0.9 % 250 mL IVPB  Status:  Discontinued     1,250 mg 166.7 mL/hr over 90 Minutes Intravenous Every 24 hours 09/11/19 1103 09/12/19 1709   09/11/19 1115  vancomycin (VANCOCIN) IVPB 1000 mg/200 mL premix     1,000 mg 200 mL/hr over 60 Minutes Intravenous Every 1 hr x 2 09/11/19 1102 09/11/19 1349   09/11/19 1115  ceFEPIme (MAXIPIME) 2 g in sodium chloride 0.9 % 100 mL IVPB  Status:  Discontinued     2 g 200 mL/hr over 30 Minutes Intravenous Every 12 hours 09/11/19 1102 09/12/19 1709        Objective:   Vitals:   09/12/19 0800 09/12/19 0805 09/12/19 1414 09/12/19 1432  BP: 113/79  95/64   Pulse: 84  85   Resp:   17   Temp: 97.7 F (36.5 C)  97.8 F (36.6 C)   TempSrc: Oral     SpO2: 92% 92% 94% 92%  Weight:      Height:        Wt Readings from Last 3 Encounters:  09/12/19 114.2 kg  08/29/19 113.2 kg  07/08/19 108.1 kg     Intake/Output Summary (Last 24 hours) at 09/12/2019 1710 Last data filed at 09/12/2019 1300 Gross per 24 hour  Intake 583 ml  Output 1000 ml  Net -417 ml     Physical Exam  Gen:- Awake Alert,  In no apparent distress  HEENT:- [place].AT, No sclera icterus Neck-Supple Neck,No JVD,.  Lungs-diminished in bases with scattered rales CV- S1, S2 normal, regular  Abd-  +ve B.Sounds, Abd  Soft, No tenderness,    Extremity/Skin:-Trace edema, pedal pulses present  Psych-affect is appropriate, oriented x3 Neuro-no new focal deficits, no tremors   Data Review:   Micro Results Recent Results (from the past 240 hour(s))  SARS Coronavirus 2 by RT PCR (hospital order, performed in [facility] hospital lab) Nasopharyngeal Nasopharyngeal Swab     Status: None   Collection Time: 09/11/19  8:52 AM   Specimen: Nasopharyngeal Swab  Result Value Ref Range Status   SARS Coronavirus 2 NEGATIVE NEGATIVE Final    Comment: (NOTE) If result is NEGATIVE SARS-CoV-2 target  nucleic acids are NOT DETECTED. The SARS-CoV-2 RNA is generally detectable in upper and lower  respiratory specimens during the acute phase of infection. The lowest  concentration of SARS-CoV-2 viral copies this assay can detect is 250  copies / mL. A negative result does not preclude SARS-CoV-2 infection  and should not be used as the sole basis for treatment or other  patient management decisions.  A negative result may occur with  improper specimen collection / handling, submission of specimen other  than nasopharyngeal swab, presence of viral mutation(s) within the  areas targeted by this assay, and inadequate number of viral copies  (<250 copies / mL). A negative result must be combined with clinical  observations, patient history, and epidemiological information. If result is POSITIVE SARS-CoV-2 target nucleic acids are DETECTED. The SARS-CoV-2 RNA is generally detectable in upper and lower  respiratory specimens dur ing the acute phase of infection.  Positive  results are indicative of active infection with SARS-CoV-2.  Clinical  correlation with patient history and other diagnostic information is  necessary to determine patient infection status.  Positive results do  not rule out bacterial infection or co-infection with other viruses. If result is PRESUMPTIVE POSTIVE SARS-CoV-2 nucleic acids MAY BE PRESENT.    A presumptive positive result was obtained on the submitted specimen  and confirmed on repeat testing.  While 2019 novel coronavirus  (SARS-CoV-2) nucleic acids may be present in the submitted sample  additional confirmatory testing may be necessary for epidemiological  and / or clinical management purposes  to differentiate between  SARS-CoV-2 and other Sarbecovirus currently known to infect humans.  If clinically indicated additional testing with an alternate test  methodology [id number]) is advised. The SARS-CoV-2 RNA is generally  detectable in upper and lower respiratory sp ecimens during the acute  phase of infection. The expected result is Negative. Fact Sheet for Patients:  StrictlyIdeas.no Fact Sheet for Healthcare Providers: BankingDealers.co.za This test is not yet approved or cleared by the [place] FDA and has been authorized for detection and/or diagnosis of SARS-CoV-2 by FDA under an Emergency Use Authorization (EUA).  This EUA will remain in effect (meaning this test can be used) for the duration of the COVID-19 declaration under Section 564(b)(1) of the Act, 21 U.S.C. section 360bbb-3(b)(1), unless the authorization is terminated or revoked sooner. Performed at [facility], [address] [place], [place] [address]     Radiology Reports Dg Chest [place] 1 View  Result Date: 09/11/2019 CLINICAL DATA:  Shortness of breath beginning this morning. EXAM: PORTABLE CHEST 1 VIEW COMPARISON:  Prior today, 06/24/2019, and 05/17/2019 FINDINGS: Mild cardiomegaly remains stable. Stable pulmonary interstitial prominence. Linear opacity in the left retrocardiac lung base is stable and consistent with scarring. No evidence of acute infiltrate or edema. No pleural effusion seen. IMPRESSION: Stable mild cardiomegaly and left basilar scarring. No acute findings. Electronically Signed   By: Marlaine Hind M.D.   On: 09/11/2019 18:19   Dg Chest  Port 1 View  Result Date: 09/11/2019 CLINICAL DATA:  Shortness of breath. EXAM: PORTABLE CHEST 1 VIEW COMPARISON:  Chest x-rays dated 06/24/2019 and 05/17/2019. FINDINGS: New interstitial opacities within the mid and lower lung zones bilaterally. Stable mild cardiomegaly. Coarse lung markings at the lung apices suggest chronic interstitial lung disease and/or emphysematous change. IMPRESSION: 1. New interstitial opacities within the mid and lower lung zones bilaterally, compatible with either CHF/volume overload or atypical pneumonia, likely superimposed on chronic interstitial lung disease/emphysema. 2. Stable mild cardiomegaly. Electronically  Signed   By: Franki Cabot M.D.   On: 09/11/2019 10:20     CBC Recent Labs  Lab 09/11/19 0900 09/12/19 0423  WBC 10.5 14.3*  HGB 15.0 14.4  HCT 46.6 44.1  PLT 187 166  MCV 93.8 92.5  MCH 30.2 30.2  MCHC 32.2 32.7  RDW 17.5* 16.9*  LYMPHSABS 4.0 1.1  MONOABS 0.5 0.4  EOSABS 0.4 0.0  BASOSABS 0.1 0.0    Chemistries  Recent Labs  Lab 09/11/19 0900 09/11/19 2213 09/12/19 0423  NA 139 134* 140  K 4.2 4.2 4.4  CL 109 104 104  CO2 19* 21* 21*  GLUCOSE 181* 189* 160*  BUN 13 17 17   CREATININE 1.69* 1.58* 1.51*  CALCIUM 9.2 9.4 10.1  MG  --  2.4 2.7*  AST  --   --  22  ALT  --   --  21  ALKPHOS  --   --  57  BILITOT  --   --  1.7*   ------------------------------------------------------------------------------------------------------------------ No results for input(s): CHOL, HDL, LDLCALC, TRIG, CHOLHDL, LDLDIRECT in the last 72 hours.  Lab Results  Component Value Date   HGBA1C 6.7 (H) 09/11/2019   ------------------------------------------------------------------------------------------------------------------ Recent Labs    09/11/19 0900  TSH 100.957*   ------------------------------------------------------------------------------------------------------------------ No results for input(s): VITAMINB12, FOLATE, FERRITIN,  TIBC, IRON, RETICCTPCT in the last 72 hours.  Coagulation profile No results for input(s): INR, PROTIME in the last 168 hours.  No results for input(s): DDIMER in the last 72 hours.  Cardiac Enzymes No results for input(s): CKMB, TROPONINI, MYOGLOBIN in the last 168 hours.  Invalid input(s): CK ------------------------------------------------------------------------------------------------------------------    Component Value Date/Time   BNP 758.0 (H) 09/12/2019 [id number]     Roxan Hockey M.D on 09/12/2019 at 5:10 PM  Go to [email or web address] - for contact info  [facility] - Office  [phone]

## 2019-09-12 NOTE — Progress Notes (Signed)
Patient refuses the use of CPAP. RT informed patient if he changes his mind have RN contact RT.

## 2019-09-12 NOTE — Progress Notes (Signed)
Notified Dr. Olevia Bowens that has pink tinged urine. No gross bleeding or blood clots noted.

## 2019-09-13 LAB — COMPREHENSIVE METABOLIC PANEL
ALT: 19 U/L (ref 0–44)
AST: 19 U/L (ref 15–41)
Albumin: 3.9 g/dL (ref 3.5–5.0)
Alkaline Phosphatase: 54 U/L (ref 38–126)
Anion gap: 10 (ref 5–15)
BUN: 24 mg/dL — ABNORMAL HIGH (ref 8–23)
CO2: 23 mmol/L (ref 22–32)
Calcium: 9.6 mg/dL (ref 8.9–10.3)
Chloride: 105 mmol/L (ref 98–111)
Creatinine, Ser: 1.54 mg/dL — ABNORMAL HIGH (ref 0.61–1.24)
GFR calc Af Amer: 56 mL/min — ABNORMAL LOW (ref >60)
GFR calc non Af Amer: 48 mL/min — ABNORMAL LOW (ref >60)
Glucose, Bld: 166 mg/dL — ABNORMAL HIGH (ref 70–99)
Potassium: 4.3 mmol/L (ref 3.5–5.1)
Sodium: 138 mmol/L (ref 135–145)
Total Bilirubin: 0.7 mg/dL (ref 0.3–1.2)
Total Protein: 6.7 g/dL (ref 6.5–8.1)

## 2019-09-13 LAB — CBC WITH DIFFERENTIAL/PLATELET
Abs Immature Granulocytes: 0.15 10*3/uL — ABNORMAL HIGH (ref 0.00–0.07)
Basophils Absolute: 0 10*3/uL (ref 0.0–0.1)
Basophils Relative: 0 %
Eosinophils Absolute: 0 10*3/uL (ref 0.0–0.5)
Eosinophils Relative: 0 %
HCT: 40.9 % (ref 39.0–52.0)
Hemoglobin: 13.3 g/dL (ref 13.0–17.0)
Immature Granulocytes: 1 %
Lymphocytes Relative: 7 %
Lymphs Abs: 1.2 10*3/uL (ref 0.7–4.0)
MCH: 30.2 pg (ref 26.0–34.0)
MCHC: 32.5 g/dL (ref 30.0–36.0)
MCV: 93 fL (ref 80.0–100.0)
Monocytes Absolute: 0.5 10*3/uL (ref 0.1–1.0)
Monocytes Relative: 3 %
Neutro Abs: 15.4 10*3/uL — ABNORMAL HIGH (ref 1.7–7.7)
Neutrophils Relative %: 89 %
Platelets: 173 10*3/uL (ref 150–400)
RBC: 4.4 MIL/uL (ref 4.22–5.81)
RDW: 17.4 % — ABNORMAL HIGH (ref 11.5–15.5)
WBC: 17.2 10*3/uL — ABNORMAL HIGH (ref 4.0–10.5)
nRBC: 0.1 % (ref 0.0–0.2)

## 2019-09-13 LAB — MAGNESIUM: Magnesium: 2.5 mg/dL — ABNORMAL HIGH (ref 1.7–2.4)

## 2019-09-13 MED ORDER — FUROSEMIDE 10 MG/ML IJ SOLN: 20.0000 mg | Freq: Every evening | INTRAMUSCULAR | Status: DC

## 2019-09-13 MED ORDER — PANTOPRAZOLE SODIUM 40 MG PO TBEC: 40.0000 mg | DELAYED_RELEASE_TABLET | Freq: Every day | ORAL | 1 refills | Status: DC

## 2019-09-13 MED ORDER — THIAMINE HCL 100 MG PO TABS: 100.0000 mg | ORAL_TABLET | Freq: Every day | ORAL | 1 refills | Status: DC

## 2019-09-13 MED ORDER — ACETAMINOPHEN 325 MG PO TABS: 650.0000 mg | ORAL_TABLET | Freq: Four times a day (QID) | ORAL | 0 refills | Status: AC | PRN

## 2019-09-13 MED ORDER — GUAIFENESIN ER 600 MG PO TB12: 1200.0000 mg | ORAL_TABLET | Freq: Two times a day (BID) | ORAL | 0 refills | Status: DC

## 2019-09-13 MED ORDER — ALBUTEROL SULFATE HFA 108 (90 BASE) MCG/ACT IN AERS: 2.0000 | INHALATION_SPRAY | Freq: Four times a day (QID) | RESPIRATORY_TRACT | 1 refills | Status: AC | PRN

## 2019-09-13 MED ORDER — FUROSEMIDE 10 MG/ML IJ SOLN
40.0000 mg | Freq: Every day | INTRAMUSCULAR | Status: DC
  Administered 2019-09-13: 10:00:00 40 mg via INTRAVENOUS
  Filled 2019-09-13: qty 4

## 2019-09-13 MED ORDER — FUROSEMIDE 40 MG PO TABS: 40.0000 mg | ORAL_TABLET | Freq: Every day | ORAL | Status: DC

## 2019-09-13 MED ORDER — FUROSEMIDE 20 MG PO TABS: 20.0000 mg | ORAL_TABLET | Freq: Two times a day (BID) | ORAL | 0 refills | Status: DC

## 2019-09-13 MED ORDER — FOLIC ACID 1 MG PO TABS: 1.0000 mg | ORAL_TABLET | Freq: Every day | ORAL | 2 refills | Status: AC

## 2019-09-13 MED ORDER — CEFDINIR 300 MG PO CAPS: 300.0000 mg | ORAL_CAPSULE | Freq: Two times a day (BID) | ORAL | 0 refills | Status: AC

## 2019-09-13 MED ORDER — DOXYCYCLINE HYCLATE 100 MG PO TABS: 100.0000 mg | ORAL_TABLET | Freq: Two times a day (BID) | ORAL | 0 refills | Status: AC

## 2019-09-13 MED ORDER — PREDNISONE 20 MG PO TABS: 20.0000 mg | ORAL_TABLET | Freq: Every day | ORAL | 0 refills | Status: AC

## 2019-09-13 NOTE — Discharge Summary (Signed)
Tony Alvarez, is a 63 y.o. male  DOB 04/09/1956  MRN [id number].  Admission date:  09/11/2019  Admitting Physician  Murlean Iba, MD  Discharge Date:  09/13/2019   Primary MD  [facility]  Recommendations for primary care physician for things to follow:   - 1)Very low-salt diet advised 2)Weigh yourself daily, call if you gain more than 3 pounds in 1 day or more than 5 pounds in 1 week as your diuretic medications may need to be adjusted 3)Limit your Fluid  intake to no more than 50 ounces (1.5 Liters) per day 4)Follow-up with PCP within a week for repeat BMP (kidney and electrolyte ) test 5) smoking cessation strongly advised 6) please use your CPAP every time you go to bed/sleep 7) please follow-up with a cardiologist within the month for reevaluation and possible adjustment of your heart medications 8) you are taking Plavix and Xarelto which are blood thinners so Avoid ibuprofen/Advil/Aleve/Motrin/Goody Powders/Naproxen/BC powders/Meloxicam/Diclofenac/Indomethacin and other Nonsteroidal anti-inflammatory medications as these will make you more likely to bleed and can cause stomach ulcers, can also cause Kidney problems.   Admission Diagnosis  Acute and chronic respiratory failure (acute-on-chronic) (HCC) [J96.20] Respiratory distress [R06.03] SOB (shortness of breath) [R06.02]   Discharge Diagnosis  Acute and chronic respiratory failure (acute-on-chronic) (HCC) [J96.20] Respiratory distress [R06.03] SOB (shortness of breath) [R06.02]    Principal Problem:   Acute and chronic respiratory failure (acute-on-chronic) (HCC) Active Problems:   Ischemic cardiomyopathy   Acute on chronic systolic CHF (congestive heart failure) (HCC)   CAD (coronary artery disease)   AKI (acute kidney injury) ([place])   Barrett's esophagus   Hyperlipidemia   Alcohol abuse   Hypothyroid   COPD  (chronic obstructive pulmonary disease) (HCC)   Sleep apnea   Hypertension      Past Medical History:  Diagnosis Date  . Barrett's esophagus 04/21/2018  . Depression   . High cholesterol   . Hypertension   . Hypothyroid   . Sleep apnea    wears CPAP at home    Past Surgical History:  Procedure Laterality Date  . BIOPSY  05/24/2018   Procedure: BIOPSY;  Surgeon: Rogene Houston, MD;  [facility];  Service: Endoscopy;;  gastric  . COLONOSCOPY    . COLONOSCOPY N/A 05/24/2018   Procedure: COLONOSCOPY;  Surgeon: Rogene Houston, MD;  [facility];  Service: Endoscopy;  Laterality: N/A;  8:55  . COLONOSCOPY WITH ESOPHAGOGASTRODUODENOSCOPY (EGD)    . CORONARY STENT INTERVENTION N/A 01/26/2019   Procedure: CORONARY STENT INTERVENTION;  Surgeon: Troy Sine, MD;  Location: [place] [facility];  Service: Cardiovascular;  Laterality: N/A;  . CORONARY/GRAFT ACUTE MI REVASCULARIZATION N/A 01/26/2019   Procedure: Coronary/Graft Acute MI Revascularization;  Surgeon: Troy Sine, MD;  Location: [place] [facility];  Service: Cardiovascular;  Laterality: N/A;  . ESOPHAGOGASTRODUODENOSCOPY N/A 05/24/2018   Procedure: ESOPHAGOGASTRODUODENOSCOPY (EGD);  Surgeon: Rogene Houston, MD;  [facility];  Service: Endoscopy;  Laterality:  N/A;  . LEFT HEART CATH AND CORONARY ANGIOGRAPHY N/A 01/26/2019   Procedure: LEFT HEART CATH AND CORONARY ANGIOGRAPHY;  Surgeon: Troy Sine, MD;  Location: [place] [facility];  Service: Cardiovascular;  Laterality: N/A;  . MEDIAL PARTIAL KNEE REPLACEMENT     2015  . POLYPECTOMY  05/24/2018   Procedure: POLYPECTOMY;  Surgeon: Rogene Houston, MD;  [facility];  Service: Endoscopy;;  colon  . rt hand injury    . shoulder surgery for bursitis    . TONSILLECTOMY         HPI  from the history and physical done on the day of admission:   - HPI: Tony Alvarez is a 63 y.o. male with CAD, ischemic cardiomyopathy, COPD,  OSA, hypertension, hyperlipidemia, CHF, chronic alcoholism with prior withdrawal, Barretts esophagus, hypothyroidism and depression presented to ED by EMS with severe acute respiratory distress.  Pt was so short of breath that he was not able to speak in full sentences or catch his breath. The symptoms apparently started early this morning.  He was very tachypneic on arrival.  He denies chest pain.  He was immediately placed on bipap therapy and started to improve.  He was noted to have diffuse wheezing.  His chest xray was positive for pulmonary edema, pneumonia and emphysema.   He was given IV lasix, IV steroids and started on IV antibiotics and supportive therapy.  His CoVID 19 test was negative. After discussing with ED provider Dr. Lacinda Axon he is being admitted to stepdown ICU.      Hospital Course:   - Brief Summary Tony Alvarez a 63 y.o.malewith CAD, ischemic cardiomyopathy, COPD, OSA, hypertension, hyperlipidemia, CHF, chronic alcoholism with prior withdrawal, Barretts esophagus, hypothyroidism and depression admitted on 09/11/2019 with acute respiratory distress presumed secondary to combination of CHF and underlying respiratory infection/pneumonia   A/p 1)HFrEF--patient with acute on chronic combined diastolic and systolic dysfunction CHF exacerbation, last known EF 25 to 30% based on echo from 08/17/2019 -Patient had underlying CAD and  ischemic cardiomyopathy -Much improved overall with IV Lasix -Continue Coreg 3.125 mg twice daily, Lipitor 80 mg daily, Plavix 75 mg daily, -Okay to discharge home on p.o. Lasix, along with Entresto 1 tablet twice daily  2) COPD Exacerbation/Pneumonia/tobacco abuse--cough and shortness of breath is much improved, no fevers, initially treated with Vanco/cefepime then deescalated to  IV Rocephin and p.o. doxycycline -Okay to discharge home on p.o. Omnicef and doxycycline -Continue mucolytics and bronchodilators -Smoking cessation strongly advised   3)AKI----acute kidney injury -due to decreased renal perfusion    creatinine on admission=1.69  ,   baseline creatinine =1.1    , creatinine is now=  1.5    , renally adjust medications, avoid nephrotoxic agents/dehydration/hypotension -Continue to watch renal function closely with Entresto and Lasix use  -Repeat BMP with PCP within a week  4)Obesity/OSA--- stable, continue to advise CPAP nightly, patient remains noncompliant with CPAP  5)Chronic alcoholism--- continue multivitamin   -no evidence of delirium tremens -Abstinence from alcohol advised  6)Hypothyroidism-stable, continue levothyroxine  7) history of left ventricular mural thrombus--on  chronic anticoagulation-continue Xarelto,  Disposition-discharge home  Code Status : full  Family Communication:   (patient is alert, awake and coherent)   Disposition Plan  : Home  Discharge Condition: Stable  Follow UP--- PCP and cardiologist as outlined in discharge instructions Diet and Activity recommendation:  As advised  Discharge Instructions     Discharge Instructions    Call MD for:  difficulty breathing,  headache or visual disturbances   Complete by: As directed    Call MD for:  extreme fatigue   Complete by: As directed    Call MD for:  persistant dizziness or light-headedness   Complete by: As directed    Call MD for:  persistant nausea and vomiting   Complete by: As directed    Call MD for:  severe uncontrolled pain   Complete by: As directed    Call MD for:  temperature >100.4   Complete by: As directed    Diet - low sodium heart healthy   Complete by: As directed    Discharge instructions   Complete by: As directed    1)Very low-salt diet advised 2)Weigh yourself daily, call if you gain more than 3 pounds in 1 day or more than 5 pounds in 1 week as your diuretic medications may need to be adjusted 3)Limit your Fluid  intake to no more than 50 ounces (1.5 Liters) per day 4)Follow-up with PCP  within a week for repeat BMP (kidney and electrolyte ) test 5) smoking cessation strongly advised 6) please use your CPAP every time you go to bed/sleep 7) please follow-up with a cardiologist within the month for reevaluation and possible adjustment of your heart medications 8) you are taking Plavix and Xarelto which are blood thinners so Avoid ibuprofen/Advil/Aleve/Motrin/Goody Powders/Naproxen/BC powders/Meloxicam/Diclofenac/Indomethacin and other Nonsteroidal anti-inflammatory medications as these will make you more likely to bleed and can cause stomach ulcers, can also cause Kidney problems.   Increase activity slowly   Complete by: As directed       Discharge Medications     Allergies as of 09/13/2019   No Known Allergies     Medication List    TAKE these medications   acetaminophen 325 MG tablet Commonly known as: TYLENOL Take 2 tablets (650 mg total) by mouth every 6 (six) hours as needed for mild pain, fever or headache (or Fever >/= 101). What changed:   medication strength  how much to take  when to take this  reasons to take this   albuterol 108 (90 Base) MCG/ACT inhaler Commonly known as: VENTOLIN HFA Inhale 2 puffs into the lungs every 6 (six) hours as needed for wheezing or shortness of breath.   ARIPiprazole 15 MG tablet Commonly known as: ABILIFY Take 15 mg by mouth at bedtime.   atorvastatin 80 MG tablet Commonly known as: LIPITOR Take 1 tablet (80 mg total) by mouth daily at 6 PM.   buPROPion 150 MG 12 hr tablet Commonly known as: WELLBUTRIN SR Take 150 mg by mouth 3 (three) times daily. Take two tablets (300 mg) by mouth every morning and one tablet (150 mg) with lunch   carvedilol 3.125 MG tablet Commonly known as: COREG Take 1 tablet (3.125 mg total) by mouth 2 (two) times daily with a meal.   cefdinir 300 MG capsule Commonly known as: OMNICEF Take 1 capsule (300 mg total) by mouth 2 (two) times daily for 5 days.   clopidogrel 75 MG tablet  Commonly known as: PLAVIX Take 1 tablet (75 mg total) by mouth daily.   doxycycline 100 MG tablet Commonly known as: VIBRA-TABS Take 1 tablet (100 mg total) by mouth 2 (two) times daily for 5 days.   Entresto 49-51 MG Generic drug: sacubitril-valsartan Take 1 tablet by mouth 2 (two) times daily.   Eszopiclone 3 MG Tabs Take 3 mg by mouth at bedtime.   folic acid 1 MG tablet Commonly known as:  FOLVITE Take 1 tablet (1 mg total) by mouth daily. Start taking on: September 14, 2019   furosemide 20 MG tablet Commonly known as: LASIX Take 1 tablet (20 mg total) by mouth 2 (two) times daily. What changed:   when to take this  reasons to take this   guaiFENesin 600 MG 12 hr tablet Commonly known as: MUCINEX Take 2 tablets (1,200 mg total) by mouth 2 (two) times daily.   levothyroxine 125 MCG tablet Commonly known as: SYNTHROID Take 125 mcg by mouth as needed.   multivitamin with minerals Tabs tablet Take 1 tablet by mouth every morning.   nitroGLYCERIN 0.4 MG SL tablet Commonly known as: NITROSTAT Place 1 tablet (0.4 mg total) under the tongue every 5 (five) minutes x 3 doses as needed for chest pain.   pantoprazole 40 MG tablet Commonly known as: Protonix Take 1 tablet (40 mg total) by mouth daily.   predniSONE 20 MG tablet Commonly known as: Deltasone Take 1 tablet (20 mg total) by mouth daily with breakfast for 3 days.   rivaroxaban 20 MG Tabs tablet Commonly known as: XARELTO Take 1 tablet (20 mg total) by mouth daily.   spironolactone 25 MG tablet Commonly known as: ALDACTONE Take 0.5 tablets (12.5 mg total) by mouth daily.   thiamine 100 MG tablet Take 1 tablet (100 mg total) by mouth daily. Start taking on: September 14, 2019   Viibryd 20 MG Tabs Generic drug: Vilazodone HCl TAKE 1 2 (ONE HALF) TABLET BY MOUTH ONCE DAILY IN THE EVENING AFTER FOOD FOR 7 DAYS THEN 1 TABLET ONCE DAILY IN THE EVENING THEREAFTER       Major procedures and Radiology Reports  - PLEASE review detailed and final reports for all details, in brief -    Dg Chest Port 1 View  Result Date: 09/11/2019 CLINICAL DATA:  Shortness of breath beginning this morning. EXAM: PORTABLE CHEST 1 VIEW COMPARISON:  Prior today, 06/24/2019, and 05/17/2019 FINDINGS: Mild cardiomegaly remains stable. Stable pulmonary interstitial prominence. Linear opacity in the left retrocardiac lung base is stable and consistent with scarring. No evidence of acute infiltrate or edema. No pleural effusion seen. IMPRESSION: Stable mild cardiomegaly and left basilar scarring. No acute findings. Electronically Signed   By: Marlaine Hind M.D.   On: 09/11/2019 18:19   Dg Chest Port 1 View  Result Date: 09/11/2019 CLINICAL DATA:  Shortness of breath. EXAM: PORTABLE CHEST 1 VIEW COMPARISON:  Chest x-rays dated 06/24/2019 and 05/17/2019. FINDINGS: New interstitial opacities within the mid and lower lung zones bilaterally. Stable mild cardiomegaly. Coarse lung markings at the lung apices suggest chronic interstitial lung disease and/or emphysematous change. IMPRESSION: 1. New interstitial opacities within the mid and lower lung zones bilaterally, compatible with either CHF/volume overload or atypical pneumonia, likely superimposed on chronic interstitial lung disease/emphysema. 2. Stable mild cardiomegaly. Electronically Signed   By: Franki Cabot M.D.   On: 09/11/2019 10:20    Micro Results   Recent Results (from the past 240 hour(s))  SARS Coronavirus 2 by RT PCR (hospital order, performed in [facility] hospital lab) Nasopharyngeal Nasopharyngeal Swab     Status: None   Collection Time: 09/11/19  8:52 AM   Specimen: Nasopharyngeal Swab  Result Value Ref Range Status   SARS Coronavirus 2 NEGATIVE NEGATIVE Final    Comment: (NOTE) If result is NEGATIVE SARS-CoV-2 target nucleic acids are NOT DETECTED. The SARS-CoV-2 RNA is generally detectable in upper and lower  respiratory specimens during the acute phase of  infection. The lowest  concentration of SARS-CoV-2 viral copies this assay can detect is 250  copies / mL. A negative result does not preclude SARS-CoV-2 infection  and should not be used as the sole basis for treatment or other  patient management decisions.  A negative result may occur with  improper specimen collection / handling, submission of specimen other  than nasopharyngeal swab, presence of viral mutation(s) within the  areas targeted by this assay, and inadequate number of viral copies  (<250 copies / mL). A negative result must be combined with clinical  observations, patient history, and epidemiological information. If result is POSITIVE SARS-CoV-2 target nucleic acids are DETECTED. The SARS-CoV-2 RNA is generally detectable in upper and lower  respiratory specimens dur ing the acute phase of infection.  Positive  results are indicative of active infection with SARS-CoV-2.  Clinical  correlation with patient history and other diagnostic information is  necessary to determine patient infection status.  Positive results do  not rule out bacterial infection or co-infection with other viruses. If result is PRESUMPTIVE POSTIVE SARS-CoV-2 nucleic acids MAY BE PRESENT.   A presumptive positive result was obtained on the submitted specimen  and confirmed on repeat testing.  While 2019 novel coronavirus  (SARS-CoV-2) nucleic acids may be present in the submitted sample  additional confirmatory testing may be necessary for epidemiological  and / or clinical management purposes  to differentiate between  SARS-CoV-2 and other Sarbecovirus currently known to infect humans.  If clinically indicated additional testing with an alternate test  methodology [phone]) is advised. The SARS-CoV-2 RNA is generally  detectable in upper and lower respiratory sp ecimens during the acute  phase of infection. The expected result is Negative. Fact Sheet for Patients:   StrictlyIdeas.no Fact Sheet for Healthcare Providers: BankingDealers.co.za This test is not yet approved or cleared by the [place] FDA and has been authorized for detection and/or diagnosis of SARS-CoV-2 by FDA under an Emergency Use Authorization (EUA).  This EUA will remain in effect (meaning this test can be used) for the duration of the COVID-19 declaration under Section 564(b)(1) of the Act, 21 U.S.C. section 360bbb-3(b)(1), unless the authorization is terminated or revoked sooner. Performed at [facility], [address], [place], [place] [address]        Today   Subjective    Tony Alvarez today has no new complaints -Ambulated in hallways without dyspnea on exertion, -O2 sats while ambulating on post ambulation is 95 to 90% on room air   Patient has been seen and examined prior to discharge   Objective   Blood pressure 103/81, pulse 83, temperature (!) 97.5 F (36.4 C), temperature source Oral, resp. rate 18, height 5\' 10"  (1.778 m), weight 113.5 kg, SpO2 95 %.   Intake/Output Summary (Last 24 hours) at 09/13/2019 1442 Last data filed at 09/13/2019 1300 Gross per 24 hour  Intake 580 ml  Output 1600 ml  Net -1020 ml    Exam Gen:- Awake Alert, no acute distress  HEENT:- [place].AT, No sclera icterus Neck-Supple Neck,No JVD,.  Lungs-much improved air movement bilaterally, no wheezing or rales  CV- S1, S2 normal, regular Abd-  +ve B.Sounds, Abd Soft, No tenderness,    Extremity/Skin:- No  edema,   good pulses Psych-affect is appropriate, oriented x3 Neuro-no new focal deficits, no tremors    Data Review   CBC w Diff:  Lab Results  Component Value Date   WBC 17.2 (H) 09/13/2019   HGB 13.3 09/13/2019  HCT 40.9 09/13/2019   PLT 173 09/13/2019   LYMPHOPCT 7 09/13/2019   MONOPCT 3 09/13/2019   EOSPCT 0 09/13/2019   BASOPCT 0 09/13/2019    CMP:  Lab Results  Component Value Date   NA 138 09/13/2019   NA  140 07/26/2019   K 4.3 09/13/2019   CL 105 09/13/2019   CO2 23 09/13/2019   BUN 24 (H) 09/13/2019   BUN 8 07/26/2019   CREATININE 1.54 (H) 09/13/2019   PROT 6.7 09/13/2019   PROT 6.5 03/28/2019   ALBUMIN 3.9 09/13/2019   ALBUMIN 4.1 03/28/2019   BILITOT 0.7 09/13/2019   BILITOT 0.7 03/28/2019   ALKPHOS 54 09/13/2019   AST 19 09/13/2019   ALT 19 09/13/2019  .   Total Discharge time is about 33 minutes  Roxan Hockey M.D on 09/13/2019 at 2:42 PM  Go to [email or web address] -  for contact info  [facility] - Office  [phone]

## 2019-09-13 NOTE — Progress Notes (Signed)
Ambulated patient in hallway. O2 sats maintained 95-98% on room air.  Patient denied any c/o sob.

## 2019-09-13 NOTE — Discharge Instructions (Signed)
1)Very low-salt diet advised 2)Weigh yourself daily, call if you gain more than 3 pounds in 1 day or more than 5 pounds in 1 week as your diuretic medications may need to be adjusted 3)Limit your Fluid  intake to no more than 50 ounces (1.5 Liters) per day 4)Follow-up with PCP within a week for repeat BMP (kidney and electrolyte ) test 5) smoking cessation strongly advised 6) please use your CPAP every time you go to bed/sleep 7) please follow-up with a cardiologist within the month for reevaluation and possible adjustment of your heart medications 8) you are taking Plavix and Xarelto which are blood thinners so Avoid ibuprofen/Advil/Aleve/Motrin/Goody Powders/Naproxen/BC powders/Meloxicam/Diclofenac/Indomethacin and other Nonsteroidal anti-inflammatory medications as these will make you more likely to bleed and can cause stomach ulcers, can also cause Kidney problems.

## 2019-09-16 ENCOUNTER — Telehealth: Payer: Self-pay | Admitting: Cardiovascular Disease

## 2019-09-16 NOTE — Telephone Encounter (Signed)
Spoke with Tony Alvarez, the patient had his repeat labs done at their office and his cr is elevated at 1.65 which is what it was at discharge from hospital. The patient is currently on furosemide 20 mg twice daily. They are asking for our recommendations on changing that medication. She is faxing the lab work for our review. They are seeing the patient next week for repeat. Left message for pt to call to see how his fluid statis is doing.

## 2019-09-16 NOTE — Telephone Encounter (Signed)
Labs received and reviewed with dr hilty. Spoke with Janett Billow, we are not going to make any changes at this time because we are unable to reach the patient. They are going to see him on 09-22-19 and repeat his lab work. They will be in touch next week once they see the patient with concerns.

## 2019-09-16 NOTE — Telephone Encounter (Signed)
Attempted to contact Jessica back with NP office to discuss- she had taken patients back, unable to speak to her- will call back if she does not return my phone call.

## 2019-09-16 NOTE — Telephone Encounter (Signed)
New Message   Tony Alvarez nurse with NP [facility] is calling because the patient is there today with elevated BMP. They are wanting cardiology input on whether or not patient medication can be changed. Please call to discuss.

## 2019-09-19 NOTE — Telephone Encounter (Signed)
Patient returned call- gave instructions to wait until 11/12 visit-  Patient verbalized understanding.

## 2019-09-26 ENCOUNTER — Telehealth: Payer: Self-pay | Admitting: Cardiovascular Disease

## 2019-09-26 NOTE — Telephone Encounter (Signed)
Called and advised that I checked MD's box and did not see anything- but to fax it over again.  Will send to MD to make aware.

## 2019-09-26 NOTE — Telephone Encounter (Signed)
New Message  Faxed over BMP after hospitalization. Want to know if it has been received and addressed with the patient

## 2019-10-07 NOTE — Progress Notes (Signed)
Cardiology Office Note:    Date:  10/10/2019   ID:  Tony Alvarez, DOB 04/30/1956, MRN [id number]  PCP:  [facility]  Cardiologist:  Shelva Majestic, MD   Referring MD: [facility]   Chief Complaint  Patient presents with   Hospitalization Follow-up    CHF    History of Present Illness:    Tony Alvarez is a 63 y.o. male with a hx of HTN, HLD, hypothyroidism, alcoholism, COPD, OSA, Barretts esophagus, and depression. He has chronic systolic heart failure, ischemic cardiomyopathy, hx of LV thrombus, and CAD with hx of anterior STEMI on 01/26/19. He was urgently taken to the cath lab and underwent emergent catheterization. Angiography revealed occlusion ot his proximal LAD immediately after a large dilated aneurysmal segment in the proximal LAD. Almost common ostium of the left main with immediate trifurcation of the LAD, small ramus intermediate, and moderate size Cx. Successful PTCA and DES to the LAD. Moderate disease in the RCA. DAPT for 1 year with aggressive BP control. Echo revealed EF of 35-40% and severe akinesis of the left ventricular mid-apical anteroseptal wall, anterior wall, apical segment and inferoapical wall. There was also a small fixed thrombus on the anteroapical wall of the LV. He was placed on xarelto. Hospital course complicated by delirium associated with alcohol withdrawal. At follow up with [name] 05/17/19 he was still on triple therapy. Echo 05/23/19 with EF of 25% and no evidence of LV thrombus. Follow up echo on 08/17/19 with EF of 25-30%, no apical thrombus, grade 2 DD. He was last seen by [name] on 08/29/19. [name] continued entresto 49-51, D/C'ed daily lasix 20 mg and recommended PRN use, changed spironolactone to 12.5 mg BID, and continued coreg 3.125 mg BID.   Pt was recently hospitalized 09/11/19-09/13/19 for acute on chronic respiratory failure secondary to CHF exacerbation, PNA, and COPD exacerbation. COVID-19 negative.  He was diuresed and discharged on ABX. He had AKI with discharged sCr 1.5. Repeat sCr was 1.65 and K 4.2.  He presents today for follow up. His baseline weight was 250 lbs 6 weeks ago. He was discharged on 20 mg lasix BID. He is now 240 lbs. He does not feel a difference. I advised to decrease to 20 mg lasix daily for now.   He is very concerned about the cost of his medications - likely xarelto and entresto. He wants to stop taking all of his medications. I advised him that he would likely end up in the hospital if that happened and that I would like to try to find a solution. He was given patient assistance forms. He has an appt to meet with our clinical pharmacist this week. He may need to discuss coumadin instead of xarelto.   He denies anginal symptoms. He feels his breathing is at baseline. He appears euvolemic today.    Past Medical History:  Diagnosis Date   Barrett's esophagus 04/21/2018   Depression    High cholesterol    Hypertension    Hypothyroid    Sleep apnea    wears CPAP at home    Past Surgical History:  Procedure Laterality Date   BIOPSY  05/24/2018   Procedure: BIOPSY;  Surgeon: Rogene Houston, MD;  [facility];  Service: Endoscopy;;  gastric   COLONOSCOPY     COLONOSCOPY N/A 05/24/2018   Procedure: COLONOSCOPY;  Surgeon: Rogene Houston, MD;  [facility];  Service: Endoscopy;  Laterality: N/A;  8:55   COLONOSCOPY WITH ESOPHAGOGASTRODUODENOSCOPY (EGD)     CORONARY STENT INTERVENTION N/A 01/26/2019   Procedure: CORONARY STENT INTERVENTION;  Surgeon: Troy Sine, MD;  Location: [place] [facility];  Service: Cardiovascular;  Laterality: N/A;   CORONARY/GRAFT ACUTE MI REVASCULARIZATION N/A 01/26/2019   Procedure: Coronary/Graft Acute MI Revascularization;  Surgeon: Troy Sine, MD;  Location: [place] [facility];  Service: Cardiovascular;  Laterality: N/A;   ESOPHAGOGASTRODUODENOSCOPY N/A 05/24/2018   Procedure:  ESOPHAGOGASTRODUODENOSCOPY (EGD);  Surgeon: Rogene Houston, MD;  [facility];  Service: Endoscopy;  Laterality: N/A;   LEFT HEART CATH AND CORONARY ANGIOGRAPHY N/A 01/26/2019   Procedure: LEFT HEART CATH AND CORONARY ANGIOGRAPHY;  Surgeon: Troy Sine, MD;  Location: [place] [facility];  Service: Cardiovascular;  Laterality: N/A;   MEDIAL PARTIAL KNEE REPLACEMENT     2015   POLYPECTOMY  05/24/2018   Procedure: POLYPECTOMY;  Surgeon: Rogene Houston, MD;  [facility];  Service: Endoscopy;;  colon   rt hand injury     shoulder surgery for bursitis     TONSILLECTOMY      Current Medications: Current Meds  Medication Sig   acetaminophen (TYLENOL) 325 MG tablet Take 2 tablets (650 mg total) by mouth every 6 (six) hours as needed for mild pain, fever or headache (or Fever >/= 101).   albuterol (VENTOLIN HFA) 108 (90 Base) MCG/ACT inhaler Inhale 2 puffs into the lungs every 6 (six) hours as needed for wheezing or shortness of breath.   ARIPiprazole (ABILIFY) 15 MG tablet Take 15 mg by mouth at bedtime.   atorvastatin (LIPITOR) 80 MG tablet Take 1 tablet (80 mg total) by mouth daily at 6 PM.   buPROPion (WELLBUTRIN SR) 150 MG 12 hr tablet Take 150 mg by mouth 3 (three) times daily. Take two tablets (300 mg) by mouth every morning and one tablet (150 mg) with lunch   carvedilol (COREG) 3.125 MG tablet Take 1 tablet (3.125 mg total) by mouth 2 (two) times daily with a meal.   cefdinir (OMNICEF) 300 MG capsule    clopidogrel (PLAVIX) 75 MG tablet Take 1 tablet (75 mg total) by mouth daily.   doxycycline (VIBRA-TABS) 100 MG tablet    Eszopiclone 3 MG TABS Take 3 mg by mouth at bedtime.   folic acid (FOLVITE) 1 MG tablet Take 1 tablet (1 mg total) by mouth daily.   furosemide (LASIX) 20 MG tablet Take 1 tablet (20 mg total) by mouth daily.   guaiFENesin (MUCINEX) 600 MG 12 hr tablet Take 2 tablets (1,200 mg total) by mouth 2 (two) times daily.    levothyroxine (SYNTHROID) 125 MCG tablet Take 125 mcg by mouth as needed.   Multiple Vitamin (MULTIVITAMIN WITH MINERALS) TABS tablet Take 1 tablet by mouth every morning.   nitroGLYCERIN (NITROSTAT) 0.4 MG SL tablet Place 1 tablet (0.4 mg total) under the tongue every 5 (five) minutes x 3 doses as needed for chest pain.   pantoprazole (PROTONIX) 40 MG tablet Take 1 tablet (40 mg total) by mouth daily.   predniSONE (DELTASONE) 20 MG tablet    QUEtiapine (SEROQUEL) 100 MG tablet TAKE 1 2 (ONE HALF) TABLET BY MOUTH ONCE DAILY AT BEDTIME. MAY RAISE TO 1 TALBET AT BEDTIME   rivaroxaban (XARELTO) 20 MG TABS tablet Take 1 tablet (20 mg total) by mouth daily.   sacubitril-valsartan (ENTRESTO) 49-51 MG Take 1 tablet by mouth 2 (two) times daily.   spironolactone (ALDACTONE) 25 MG  tablet Take 0.5 tablets (12.5 mg total) by mouth daily.   thiamine 100 MG tablet Take 1 tablet (100 mg total) by mouth daily.   VIIBRYD 20 MG TABS TAKE 1 2 (ONE HALF) TABLET BY MOUTH ONCE DAILY IN THE EVENING AFTER FOOD FOR 7 DAYS THEN 1 TABLET ONCE DAILY IN THE EVENING THEREAFTER   [DISCONTINUED] furosemide (LASIX) 20 MG tablet Take 1 tablet (20 mg total) by mouth 2 (two) times daily.     Allergies:   Patient has no known allergies.   Social History   Socioeconomic History   Marital status: Widowed    Spouse name: Not on file   Number of children: Not on file   Years of education: Not on file   Highest education level: Not on file  Occupational History   Not on file  Social Needs   Financial resource strain: Not on file   Food insecurity    Worry: Not on file    Inability: Not on file   Transportation needs    Medical: Not on file    Non-medical: Not on file  Tobacco Use   Smoking status: Current Every Day Smoker    Packs/day: 0.25    Years: 40.00    Pack years: 10.00    Types: Cigarettes   Smokeless tobacco: Never Used   Tobacco comment: 7 cigarettes per day 05/25/19  Substance and  Sexual Activity   Alcohol use: Yes    Frequency: Never    Comment: very rare   Drug use: Never   Sexual activity: Not on file  Lifestyle   Physical activity    Days per week: Not on file    Minutes per session: Not on file   Stress: Not on file  Relationships   Social connections    Talks on phone: Not on file    Gets together: Not on file    Attends religious service: Not on file    Active member of club or organization: Not on file    Attends meetings of clubs or organizations: Not on file    Relationship status: Not on file  Other Topics Concern   Not on file  Social History Narrative   Not on file     Family History: The patient's family history is negative for Colon cancer.  ROS:   Please see the history of present illness.     All other systems reviewed and are negative.  EKGs/Labs/Other Studies Reviewed:    The following studies were reviewed today:  Cath 01/26/2019  Prox RCA lesion is 20% stenosed.  Prox Cx lesion is 20% stenosed.  Prox LAD-1 lesion is 100% stenosed.  Post intervention, there is a 0% residual stenosis.  Prox LAD-2 lesion is 20% stenosed.  Mid LAD lesion is 40% stenosed.  LV end diastolic pressure is mildly elevated.  There is moderate left ventricular systolic dysfunction.  A stent was successfully placed.  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  Almost a common ostium left main with immediate trifurcation into the LAD, small ramus intermediate vessel and moderate size circumflex vessel. The ramus vessel is normal. The circumflex vessel has 20% proximal narrowing and gave rise to 2 marginal branches. The RCA is a dominant vessel with moderate luminal irregularity with 20% narrowing proximally 40% mid stenosis and 30% narrowing proximal to the acute margin.  Probable moderate acute  LV dysfunction with hypocontractility involving the anterolateral wall and apex. LVEDP 22 mm.  Successful PCI to the totally occluded LAD with PTCA and ultimate insertion of a 3.0 x 22 mm Resolute Onyx stent postdilated to 3.26 mm with the 100% occlusion being reduced to 0% and TIMI 0 flow improving to TIMI-3 flow. Once reperfusion was established, LAD had 20% mid LAD smooth narrowing and 40% distal smooth stenosis. The LAD wrapped around the apex and supplied the distal third of the inferior wall.  RECOMMENDATION: DAPT for minimum of 1 year. Optimal blood pressure control with ideal blood pressure less than 120/80. High potency statin therapy with target LDL less than 70. Medical therapy for concomitant CAD. A 2D echo Doppler study will be ordered improved LV function assessment.    Intervention     Echo 08/17/19:  1. Left ventricular ejection fraction, by visual estimation, is 25 to 30%. The left ventricle has severely decreased function. There is no left ventricular hypertrophy.  2. Left ventricular diastolic Doppler parameters are consistent with pseudonormalization pattern of LV diastolic filling.  3. There is akinesis of the mid- distal anterioseptal wall, apex and apical lateral regions.          There is spontaneous contrast in apical LV .     Definity contrast was used. There is no evidence of apical thrombi.  4. Global right ventricle has normal systolic function.The right ventricular size is normal. No increase in right ventricular wall thickness.  5. Left atrial size was mildly dilated.  6. Right atrial size was normal.  7. The mitral valve is normal in structure. Moderate mitral valve regurgitation.  8. The tricuspid valve is normal in structure. Tricuspid valve regurgitation is trivial.  9. The aortic valve is normal in structure. Aortic valve regurgitation was not visualized by color flow Doppler. 10. The pulmonic valve was grossly normal. Pulmonic valve  regurgitation is not visualized by color flow Doppler. 11. Aortic dilatation noted. 12. There is mild dilatation of the ascending aorta measuring 39 mm. 13. Normal pulmonary artery systolic pressure. 14. The atrial septum is grossly normal.  EKG:  EKG is  ordered today.  The ekg ordered today demonstrates sinus rhythm, nonspecific intraventricular block (old), evidence of inferior and anterolateral infarct  Recent Labs: 07/26/2019: NT-Pro BNP 992 09/11/2019: TSH 100.957 09/12/2019: B Natriuretic Peptide 758.0 09/13/2019: ALT 19; BUN 24; Creatinine, Ser 1.54; Hemoglobin 13.3; Magnesium 2.5; Platelets 173; Potassium 4.3; Sodium 138  Recent Lipid Panel    Component Value Date/Time   CHOL 113 03/28/2019 0834   TRIG 136 03/28/2019 0834   HDL 27 (L) 03/28/2019 0834   CHOLHDL 4.2 03/28/2019 0834   CHOLHDL 5.6 01/26/2019 2056   VLDL 38 01/26/2019 2056   LDLCALC 59 03/28/2019 0834    Physical Exam:    VS:  BP 103/71    Pulse 82    Temp (!) 96.9 F (36.1 C)    Ht 5\' 10"  (1.778 m)    Wt 242 lb 9.6 oz (110 kg)    SpO2 97%    BMI 34.81 kg/m     Wt Readings from Last 3 Encounters:  10/10/19 242 lb 9.6 oz (110 kg)  09/13/19 250 lb 3.6 oz (113.5 kg)  08/29/19 249 lb 9.6 oz (113.2 kg)     GEN:  Well nourished, well developed in no acute distress HEENT: Normal NECK: No JVD; No carotid bruits LYMPHATICS: No lymphadenopathy CARDIAC: RRR, no murmurs, rubs, gallops RESPIRATORY:  Respirations unlabored, occasional crackle ABDOMEN:  Soft, non-tender, non-distended MUSCULOSKELETAL:  No edema; No deformity  SKIN: Warm and dry NEUROLOGIC:  Alert and oriented x 3 PSYCHIATRIC:  Normal affect   ASSESSMENT:    1. CAD in native artery   2. ST elevation myocardial infarction involving left anterior descending (LAD) coronary artery : 3/18 2020 Rx with DES stent(HCC)   3. Ischemic cardiomyopathy   4. Essential hypertension   5. LV (left ventricular) mural thrombus with acute MI ([place])   6. Chronic  combined systolic and diastolic heart failure (HCC)   7. Stage 3 chronic kidney disease, unspecified whether stage 3a or 3b CKD    PLAN:    In order of problems listed above:  Ischemic cardiomyopathy Chronic systolic and diastolic heart failure Hypertension - entresto 49-51 BID, spironolactone 12.5 mg BID, coreg 3.125 mg BID - advised to decrease lasix to 20 mg daily - will titrate this back slowly and monitor symptoms - no room to titrate entresto - BP log with systolic pressures in the 90-100s   Acute on chronic kidney disease stage III sCr 1.65 - stable from discharge. He will decrease lasix to 20 mg once daily. He should have labs when he comes back later this week.    CAD s/p STEMI DES to LAD 01/2019 - continue plavix, no ASA with AC - no anginal symptoms - continue BB   LV thrombus following STEMI - placed on xarelto for [facility] - follow up echo's without evidence of clot - he may need to transition to coumadin for cost - I will message [name] for length of therapy needed   Hyperlipidemia 01/26/2019: VLDL 38 03/28/2019: Cholesterol, Total 113; HDL 27; LDL Calculated 59; Triglycerides 136 - continue statin   He is scheduled to follow up with HTN clinic and will need a BMP. He already has follow up with [name].   Medication Adjustments/Labs and Tests Ordered: Current medicines are reviewed at length with the patient today.  Concerns regarding medicines are outlined above.  Orders Placed This Encounter  Procedures   Basic metabolic panel   Meds ordered this encounter  Medications   furosemide (LASIX) 20 MG tablet    Sig: Take 1 tablet (20 mg total) by mouth daily.    Dispense:  30 tablet    Refill:  0    Signed, Ledora Bottcher, [place]  10/10/2019 2:46 PM    [facility]

## 2019-10-10 ENCOUNTER — Other Ambulatory Visit: Payer: Self-pay

## 2019-10-10 ENCOUNTER — Ambulatory Visit (INDEPENDENT_AMBULATORY_CARE_PROVIDER_SITE_OTHER): Payer: BC Managed Care – PPO | Admitting: Physician Assistant

## 2019-10-10 ENCOUNTER — Encounter: Payer: Self-pay | Admitting: Physician Assistant

## 2019-10-10 VITALS — BP 103/71 | HR 82 | Temp 96.9°F | Ht 70.0 in | Wt 242.6 lb

## 2019-10-10 DIAGNOSIS — I2510 Atherosclerotic heart disease of native coronary artery without angina pectoris: Secondary | ICD-10-CM

## 2019-10-10 DIAGNOSIS — I255 Ischemic cardiomyopathy: Secondary | ICD-10-CM | POA: Diagnosis not present

## 2019-10-10 DIAGNOSIS — I10 Essential (primary) hypertension: Secondary | ICD-10-CM | POA: Diagnosis not present

## 2019-10-10 DIAGNOSIS — I5042 Chronic combined systolic (congestive) and diastolic (congestive) heart failure: Secondary | ICD-10-CM

## 2019-10-10 DIAGNOSIS — I2129 ST elevation (STEMI) myocardial infarction involving other sites: Secondary | ICD-10-CM

## 2019-10-10 DIAGNOSIS — I2102 ST elevation (STEMI) myocardial infarction involving left anterior descending coronary artery: Secondary | ICD-10-CM

## 2019-10-10 DIAGNOSIS — N183 Chronic kidney disease, stage 3 unspecified: Secondary | ICD-10-CM

## 2019-10-10 MED ORDER — FUROSEMIDE 20 MG PO TABS: 20.0000 mg | ORAL_TABLET | Freq: Every day | ORAL | 0 refills | Status: DC

## 2019-10-10 NOTE — Patient Instructions (Signed)
Medication Instructions:   Decrease Lasix to 20 mg once daily.  *If you need a refill on your cardiac medications before your next appointment, please call your pharmacy*  Lab Work: Your physician recommends that you return for lab work when you come for your pharmacy appointment: BMET  If you have labs (blood work) drawn today and your tests are completely normal, you will receive your results only by: Marland Kitchen MyChart Message (if you have MyChart) OR . A paper copy in the mail If you have any lab test that is abnormal or we need to change your treatment, we will call you to review the results.   Follow-Up: At [facility], you and your health needs are our priority.  As part of our continuing mission to provide you with exceptional heart care, we have created designated Provider Care Teams.  These Care Teams include your primary Cardiologist (physician) and Advanced Practice Providers (APPs -  Physician Assistants and Nurse Practitioners) who all work together to provide you with the care you need, when you need it.  Your next appointment:   Please keep your scheduled follow-up appointment with [name] in January.  Other Instructions Continue to weigh daily.  Please bring your Patient Assistance forms back with you when you return for your pharmacy appointment and blood work. We will finish the forms, sign them and fax them in. Thank you!

## 2019-10-13 ENCOUNTER — Ambulatory Visit (INDEPENDENT_AMBULATORY_CARE_PROVIDER_SITE_OTHER): Payer: BC Managed Care – PPO | Admitting: Pharmacist Clinician (PhC)/ Clinical Pharmacy Specialist

## 2019-10-13 ENCOUNTER — Other Ambulatory Visit: Payer: Self-pay

## 2019-10-13 ENCOUNTER — Other Ambulatory Visit (INDEPENDENT_AMBULATORY_CARE_PROVIDER_SITE_OTHER): Payer: BC Managed Care – PPO

## 2019-10-13 DIAGNOSIS — I10 Essential (primary) hypertension: Secondary | ICD-10-CM | POA: Diagnosis not present

## 2019-10-13 DIAGNOSIS — I255 Ischemic cardiomyopathy: Secondary | ICD-10-CM | POA: Diagnosis not present

## 2019-10-13 DIAGNOSIS — I2129 ST elevation (STEMI) myocardial infarction involving other sites: Secondary | ICD-10-CM

## 2019-10-13 DIAGNOSIS — I2510 Atherosclerotic heart disease of native coronary artery without angina pectoris: Secondary | ICD-10-CM | POA: Diagnosis not present

## 2019-10-13 DIAGNOSIS — I5023 Acute on chronic systolic (congestive) heart failure: Secondary | ICD-10-CM

## 2019-10-13 DIAGNOSIS — I2102 ST elevation (STEMI) myocardial infarction involving left anterior descending coronary artery: Secondary | ICD-10-CM

## 2019-10-13 NOTE — Progress Notes (Signed)
10/14/2019 Tony Alvarez 08/12/1956 [id number]   HPI:  Tony Alvarez is a 63 y.o. male patient of Dr Claiborne Billings, with a PMH below who presents today for hypertension clinic evaluation.  See pertinent medical history below.   He was last seen by Doreene Adas PA about 2 weeks ago and based on his blood pressure at that time, had no room to increase the Entresto dose.  He was asked to follow up today to see if any further up-titrations could occur with his heart failure medications.    Patient comes in today feeling very tired.  He had taken a sleeping pill last night and the effects were still noticeable.  He describes 01/12/19 as a rough year.  His wife passed away in 2023/01/12, followed by his STEMI in March.  He has been having trouble affording his brand name medications (Xarelto and Entresto).  He was given paperwork to file for patient assistance, but as his wife had kept all financial records, he is having trouble finding the right paperwork.  Also his income has dropped significantly since filing Jan 12, 2018 taxes, due to her death.  As he is < 65, not on Medicare and has [employer], I am not sure why he was not offered co-pay cards for these medications.    Per our records he has lost 9 pounds in the past month.  He admits to feeling better overall since starting the Orange, but does have occasional spells of being lightheaded or feeling washed out.  This is understandable, as his home BP readings are actually a bit lower than we would like to see.   Past Medical History: hyperlipidemia 03/2019 - TC 113, TG 136, HDL 27, LCL 59 - on atorvastatin 80  ASCVD Anterior STEMI 01/2019; DES to LAD, moderate RCA disease; on clopidogrel (no ASA due to anticoagulant);   HFrEF At time of STEMI was 35-40%, f/u 10/20 showed 25-30% (severe akinesis of LV mid-apical anteroseptal wall, anterior wall, apical segment and inferoapical wall); Entresto 49/51 mg  LV thrombus At time of STEMI, not seen on f/u echo 08/2019    hypothyroidism 11/20 TSH 100.957, T4 <0.25, on levothyroxine 0.125 mg  COPD Albuterol MDI  OSA Uses CPAP  Barrett's esophagus On pantoprazole   Alcoholism Pt denies alcohol use for past 10+ years     Blood Pressure Goal:  130/80  Current Medications: Entresto 49/51 bid, carvedilol 3.125 mg bid, spironolactone 25 mg qd, furosemide 20 mg qd  Family Hx: mother died from lung issues at 3; father died from multiple strokes at 66; 4 siblings, 1 brother with cardiomegaly, sister with leaky valves (since childhood)  Social Hx:  3/4 ppd smoker, started as early teen - has been trying to quit since MI.  States now down to an average of 2 cigarettes per day, yesterday only smoked 1/2 cigarette.  Wanted information on smoking cessation; drinks diet pepsi most days; no alcohol hin 10-15 years  Diet: no added salt, does pan fry some foods; "sick of chicken" but eating mix of meats; no vegetables (states its too hard to cook veggies for 1)    Exercise: no regular exercise, has bone on bone knee issues  Home BP readings: mostly < 923 systolic; home machine wrist model about 61 years old.  Did not bring cuff or list of home readings, but noted systolic usually < 300, diastolic <  60  Intolerances: nkda  Labs: 09/2019: Na 142, K 4.2, Glu 113, BUN 22, SCr 1.63, eGFR  77    Wt Readings from Last 3 Encounters:  10/13/19 241 lb (109.3 kg)  10/10/19 242 lb 9.6 oz (110 kg)  09/13/19 250 lb 3.6 oz (113.5 kg)   BP Readings from Last 3 Encounters:  10/13/19 (!) 82/58  10/10/19 103/71  09/13/19 103/81   Pulse Readings from Last 3 Encounters:  10/13/19 92  10/10/19 82  09/13/19 83    Current Outpatient Medications  Medication Sig Dispense Refill   acetaminophen (TYLENOL) 325 MG tablet Take 2 tablets (650 mg total) by mouth every 6 (six) hours as needed for mild pain, fever or headache (or Fever >/= 101). 12 tablet 0   albuterol (VENTOLIN HFA) 108 (90 Base) MCG/ACT inhaler Inhale 2 puffs into the  lungs every 6 (six) hours as needed for wheezing or shortness of breath. 18 g 1   ARIPiprazole (ABILIFY) 15 MG tablet Take 15 mg by mouth at bedtime.     atorvastatin (LIPITOR) 80 MG tablet Take 1 tablet (80 mg total) by mouth daily at 6 PM. 90 tablet 3   buPROPion (WELLBUTRIN SR) 150 MG 12 hr tablet Take 150 mg by mouth 3 (three) times daily. Take two tablets (300 mg) by mouth every morning and one tablet (150 mg) with lunch  0   carvedilol (COREG) 3.125 MG tablet Take 1 tablet (3.125 mg total) by mouth 2 (two) times daily with a meal. 60 tablet 5   cefdinir (OMNICEF) 300 MG capsule      clopidogrel (PLAVIX) 75 MG tablet Take 1 tablet (75 mg total) by mouth daily. 90 tablet 3   doxycycline (VIBRA-TABS) 100 MG tablet      Eszopiclone 3 MG TABS Take 3 mg by mouth at bedtime.     folic acid (FOLVITE) 1 MG tablet Take 1 tablet (1 mg total) by mouth daily. 30 tablet 2   furosemide (LASIX) 20 MG tablet Take 1 tablet (20 mg total) by mouth daily. 30 tablet 0   guaiFENesin (MUCINEX) 600 MG 12 hr tablet Take 2 tablets (1,200 mg total) by mouth 2 (two) times daily. 20 tablet 0   levothyroxine (SYNTHROID) 125 MCG tablet Take 125 mcg by mouth as needed.     Multiple Vitamin (MULTIVITAMIN WITH MINERALS) TABS tablet Take 1 tablet by mouth every morning.     nitroGLYCERIN (NITROSTAT) 0.4 MG SL tablet Place 1 tablet (0.4 mg total) under the tongue every 5 (five) minutes x 3 doses as needed for chest pain. 25 tablet 3   pantoprazole (PROTONIX) 40 MG tablet Take 1 tablet (40 mg total) by mouth daily. 30 tablet 1   predniSONE (DELTASONE) 20 MG tablet      QUEtiapine (SEROQUEL) 100 MG tablet TAKE 1 2 (ONE HALF) TABLET BY MOUTH ONCE DAILY AT BEDTIME. MAY RAISE TO 1 TALBET AT BEDTIME     rivaroxaban (XARELTO) 20 MG TABS tablet Take 1 tablet (20 mg total) by mouth daily. 30 tablet 11   sacubitril-valsartan (ENTRESTO) 49-51 MG Take 1 tablet by mouth 2 (two) times daily. 60 tablet 6   spironolactone  (ALDACTONE) 25 MG tablet Take 0.5 tablets (12.5 mg total) by mouth daily. 45 tablet 1   thiamine 100 MG tablet Take 1 tablet (100 mg total) by mouth daily. 30 tablet 1   VIIBRYD 20 MG TABS TAKE 1 2 (ONE HALF) TABLET BY MOUTH ONCE DAILY IN THE EVENING AFTER FOOD FOR 7 DAYS THEN 1 TABLET ONCE DAILY IN THE EVENING THEREAFTER     No current facility-administered  medications for this visit.     No Known Allergies  Past Medical History:  Diagnosis Date   Barrett's esophagus 04/21/2018   Depression    High cholesterol    Hypertension    Hypothyroid    Sleep apnea    wears CPAP at home    Blood pressure (!) 82/58, pulse 92, resp. rate 16, height '5\' 10"'  (1.778 m), weight 241 lb (109.3 kg), SpO2 92 %.  Acute on chronic systolic CHF (congestive heart failure) ([place]) Patient with HFrEF, now at about 25-30%.   He has felt better with his current regimen, although I believe his BP is a little too low.  I will have him reduce the spironolactone to just 12.5 mg once daily, rather than the 25 mg dose he had been taking.  He is due to see [name] in early January.  At that time, if his blood pressure remains low, we would probably benefit from decreasing the Entresto dose or cutting out the spironolactone altogether.   Patient aware that he should bring in list of home BP readings to his next appointment.    As for his smoking cessation, I praised him on getting down to just a couple of cigarettes per day.  Suggested that he would be doing worse to try nicotine gum or patches at this point, and since he is already on buproprion, he's getting dual benefit from that.  I simply suggested that he continue to taper and set a goal of January first to not buy any more cigarettes.  Patient seemed agreeable to this plan.     Tommy Medal PharmD CPP [facility] [address] [place], [place] [address] [phone]

## 2019-10-13 NOTE — Patient Instructions (Addendum)
Return for a a follow up appointment with [name] in January  Your blood pressure today is 82/58  Check your blood pressure at home daily and keep record of the readings.  Take your BP meds as follows:  Cut spironolactone tablet in half and just take 1/2 tablet each day.     Continue with all other medications  Bring all of your meds, your BP cuff and your record of home blood pressures to your next appointment.  Exercise as you're able, try to walk approximately 30 minutes per day.  Keep salt intake to a minimum, especially watch canned and prepared boxed foods.  Eat more fresh fruits and vegetables and fewer canned items.  Avoid eating in fast food restaurants.    HOW TO TAKE YOUR BLOOD PRESSURE: . Rest 5 minutes before taking your blood pressure. .  Don't smoke or drink caffeinated beverages for at least 30 minutes before. . Take your blood pressure before (not after) you eat. . Sit comfortably with your back supported and both feet on the floor (don't cross your legs). . Elevate your arm to heart level on a table or a desk. . Use the proper sized cuff. It should fit smoothly and snugly around your bare upper arm. There should be enough room to slip a fingertip under the cuff. The bottom edge of the cuff should be 1 inch above the crease of the elbow. . Ideally, take 3 measurements at one sitting and record the average.

## 2019-10-14 NOTE — Assessment & Plan Note (Addendum)
Patient with HFrEF, now at about 25-30%.   He has felt better with his current regimen, although I believe his BP is a little too low.  I will have him reduce the spironolactone to just 12.5 mg once daily, rather than the 25 mg dose he had been taking.  He is due to see [name] in early January.  At that time, if his blood pressure remains low, we would probably benefit from decreasing the Entresto dose or cutting out the spironolactone altogether.   Patient aware that he should bring in list of home BP readings to his next appointment.    As for his smoking cessation, I praised him on getting down to just a couple of cigarettes per day.  Suggested that he would be doing worse to try nicotine gum or patches at this point, and since he is already on buproprion, he's getting dual benefit from that.  I simply suggested that he continue to taper and set a goal of January first to not buy any more cigarettes.  Patient seemed agreeable to this plan.

## 2019-10-17 ENCOUNTER — Encounter (HOSPITAL_COMMUNITY): Payer: Self-pay

## 2019-10-17 ENCOUNTER — Telehealth: Payer: Self-pay | Admitting: Cardiovascular Disease

## 2019-10-17 ENCOUNTER — Other Ambulatory Visit: Payer: Self-pay

## 2019-10-17 ENCOUNTER — Inpatient Hospital Stay (HOSPITAL_COMMUNITY)
Admission: EM | Admit: 2019-10-17 | Discharge: 2019-10-21 | DRG: 291 | Disposition: A | Discharge status: Home / Self Care | Payer: BC Managed Care – PPO | Attending: Hospitalist | Admitting: Hospitalist

## 2019-10-17 ENCOUNTER — Emergency Department (HOSPITAL_COMMUNITY): Payer: BC Managed Care – PPO

## 2019-10-17 DIAGNOSIS — Z79890 Hormone replacement therapy (postmenopausal): Secondary | ICD-10-CM

## 2019-10-17 DIAGNOSIS — I10 Essential (primary) hypertension: Secondary | ICD-10-CM | POA: Diagnosis present

## 2019-10-17 DIAGNOSIS — K219 Gastro-esophageal reflux disease without esophagitis: Secondary | ICD-10-CM | POA: Diagnosis present

## 2019-10-17 DIAGNOSIS — I255 Ischemic cardiomyopathy: Secondary | ICD-10-CM | POA: Diagnosis present

## 2019-10-17 DIAGNOSIS — Z7902 Long term (current) use of antithrombotics/antiplatelets: Secondary | ICD-10-CM

## 2019-10-17 DIAGNOSIS — F1010 Alcohol abuse, uncomplicated: Secondary | ICD-10-CM | POA: Diagnosis present

## 2019-10-17 DIAGNOSIS — Z7982 Long term (current) use of aspirin: Secondary | ICD-10-CM

## 2019-10-17 DIAGNOSIS — Z9119 Patient's noncompliance with other medical treatment and regimen: Secondary | ICD-10-CM

## 2019-10-17 DIAGNOSIS — E876 Hypokalemia: Secondary | ICD-10-CM | POA: Diagnosis present

## 2019-10-17 DIAGNOSIS — F32A Depression, unspecified: Secondary | ICD-10-CM | POA: Diagnosis present

## 2019-10-17 DIAGNOSIS — I248 Other forms of acute ischemic heart disease: Secondary | ICD-10-CM | POA: Diagnosis present

## 2019-10-17 DIAGNOSIS — Z96659 Presence of unspecified artificial knee joint: Secondary | ICD-10-CM | POA: Diagnosis present

## 2019-10-17 DIAGNOSIS — I447 Left bundle-branch block, unspecified: Secondary | ICD-10-CM | POA: Diagnosis present

## 2019-10-17 DIAGNOSIS — Z955 Presence of coronary angioplasty implant and graft: Secondary | ICD-10-CM | POA: Diagnosis not present

## 2019-10-17 DIAGNOSIS — J9601 Acute respiratory failure with hypoxia: Secondary | ICD-10-CM | POA: Diagnosis present

## 2019-10-17 DIAGNOSIS — G4733 Obstructive sleep apnea (adult) (pediatric): Secondary | ICD-10-CM | POA: Diagnosis present

## 2019-10-17 DIAGNOSIS — I252 Old myocardial infarction: Secondary | ICD-10-CM | POA: Diagnosis not present

## 2019-10-17 DIAGNOSIS — I130 Hypertensive heart and chronic kidney disease with heart failure and stage 1 through stage 4 chronic kidney disease, or unspecified chronic kidney disease: Principal | ICD-10-CM | POA: Diagnosis present

## 2019-10-17 DIAGNOSIS — Z79899 Other long term (current) drug therapy: Secondary | ICD-10-CM

## 2019-10-17 DIAGNOSIS — I5043 Acute on chronic combined systolic (congestive) and diastolic (congestive) heart failure: Secondary | ICD-10-CM | POA: Diagnosis present

## 2019-10-17 DIAGNOSIS — F1020 Alcohol dependence, uncomplicated: Secondary | ICD-10-CM | POA: Diagnosis present

## 2019-10-17 DIAGNOSIS — E785 Hyperlipidemia, unspecified: Secondary | ICD-10-CM | POA: Diagnosis present

## 2019-10-17 DIAGNOSIS — K2270 Barrett's esophagus without dysplasia: Secondary | ICD-10-CM | POA: Diagnosis present

## 2019-10-17 DIAGNOSIS — I25118 Atherosclerotic heart disease of native coronary artery with other forms of angina pectoris: Secondary | ICD-10-CM | POA: Diagnosis not present

## 2019-10-17 DIAGNOSIS — F17210 Nicotine dependence, cigarettes, uncomplicated: Secondary | ICD-10-CM | POA: Diagnosis present

## 2019-10-17 DIAGNOSIS — Z86010 Personal history of colonic polyps: Secondary | ICD-10-CM

## 2019-10-17 DIAGNOSIS — E7800 Pure hypercholesterolemia, unspecified: Secondary | ICD-10-CM | POA: Diagnosis present

## 2019-10-17 DIAGNOSIS — Z7952 Long term (current) use of systemic steroids: Secondary | ICD-10-CM

## 2019-10-17 DIAGNOSIS — F329 Major depressive disorder, single episode, unspecified: Secondary | ICD-10-CM | POA: Diagnosis present

## 2019-10-17 DIAGNOSIS — Z20828 Contact with and (suspected) exposure to other viral communicable diseases: Secondary | ICD-10-CM | POA: Diagnosis present

## 2019-10-17 DIAGNOSIS — Z7901 Long term (current) use of anticoagulants: Secondary | ICD-10-CM | POA: Diagnosis not present

## 2019-10-17 DIAGNOSIS — E039 Hypothyroidism, unspecified: Secondary | ICD-10-CM | POA: Diagnosis present

## 2019-10-17 DIAGNOSIS — R0902 Hypoxemia: Secondary | ICD-10-CM | POA: Diagnosis not present

## 2019-10-17 DIAGNOSIS — T502X5A Adverse effect of carbonic-anhydrase inhibitors, benzothiadiazides and other diuretics, initial encounter: Secondary | ICD-10-CM | POA: Diagnosis present

## 2019-10-17 DIAGNOSIS — G4730 Sleep apnea, unspecified: Secondary | ICD-10-CM | POA: Diagnosis present

## 2019-10-17 DIAGNOSIS — I5023 Acute on chronic systolic (congestive) heart failure: Secondary | ICD-10-CM | POA: Diagnosis not present

## 2019-10-17 DIAGNOSIS — R0602 Shortness of breath: Secondary | ICD-10-CM | POA: Diagnosis not present

## 2019-10-17 DIAGNOSIS — I493 Ventricular premature depolarization: Secondary | ICD-10-CM | POA: Diagnosis present

## 2019-10-17 DIAGNOSIS — Z791 Long term (current) use of non-steroidal anti-inflammatories (NSAID): Secondary | ICD-10-CM

## 2019-10-17 DIAGNOSIS — I509 Heart failure, unspecified: Secondary | ICD-10-CM

## 2019-10-17 DIAGNOSIS — J449 Chronic obstructive pulmonary disease, unspecified: Secondary | ICD-10-CM | POA: Diagnosis present

## 2019-10-17 DIAGNOSIS — N183 Chronic kidney disease, stage 3 unspecified: Secondary | ICD-10-CM | POA: Diagnosis present

## 2019-10-17 DIAGNOSIS — I513 Intracardiac thrombosis, not elsewhere classified: Secondary | ICD-10-CM | POA: Diagnosis not present

## 2019-10-17 DIAGNOSIS — I2510 Atherosclerotic heart disease of native coronary artery without angina pectoris: Secondary | ICD-10-CM | POA: Diagnosis present

## 2019-10-17 DIAGNOSIS — I5020 Unspecified systolic (congestive) heart failure: Secondary | ICD-10-CM

## 2019-10-17 DIAGNOSIS — Z720 Tobacco use: Secondary | ICD-10-CM | POA: Diagnosis not present

## 2019-10-17 HISTORY — DX: Atherosclerotic heart disease of native coronary artery without angina pectoris: I25.10

## 2019-10-17 HISTORY — DX: Heart failure, unspecified: I50.9

## 2019-10-17 LAB — COMPREHENSIVE METABOLIC PANEL
ALT: 19 U/L (ref 0–44)
AST: 22 U/L (ref 15–41)
Albumin: 4.4 g/dL (ref 3.5–5.0)
Alkaline Phosphatase: 82 U/L (ref 38–126)
Anion gap: 16 — ABNORMAL HIGH (ref 5–15)
BUN: 19 mg/dL (ref 8–23)
CO2: 17 mmol/L — ABNORMAL LOW (ref 22–32)
Calcium: 9.4 mg/dL (ref 8.9–10.3)
Chloride: 108 mmol/L (ref 98–111)
Creatinine, Ser: 1.64 mg/dL — ABNORMAL HIGH (ref 0.61–1.24)
GFR calc Af Amer: 52 mL/min — ABNORMAL LOW (ref >60)
GFR calc non Af Amer: 44 mL/min — ABNORMAL LOW (ref >60)
Glucose, Bld: 232 mg/dL — ABNORMAL HIGH (ref 70–99)
Potassium: 3.3 mmol/L — ABNORMAL LOW (ref 3.5–5.1)
Sodium: 141 mmol/L (ref 135–145)
Total Bilirubin: 1.2 mg/dL (ref 0.3–1.2)
Total Protein: 7.7 g/dL (ref 6.5–8.1)

## 2019-10-17 LAB — CBC WITH DIFFERENTIAL/PLATELET
Abs Immature Granulocytes: 0.03 10*3/uL (ref 0.00–0.07)
Basophils Absolute: 0.1 10*3/uL (ref 0.0–0.1)
Basophils Relative: 1 %
Eosinophils Absolute: 0.3 10*3/uL (ref 0.0–0.5)
Eosinophils Relative: 3 %
HCT: 45.7 % (ref 39.0–52.0)
Hemoglobin: 14.6 g/dL (ref 13.0–17.0)
Immature Granulocytes: 0 %
Lymphocytes Relative: 31 %
Lymphs Abs: 3.4 10*3/uL (ref 0.7–4.0)
MCH: 30.9 pg (ref 26.0–34.0)
MCHC: 31.9 g/dL (ref 30.0–36.0)
MCV: 96.6 fL (ref 80.0–100.0)
Monocytes Absolute: 0.6 10*3/uL (ref 0.1–1.0)
Monocytes Relative: 5 %
Neutro Abs: 6.7 10*3/uL (ref 1.7–7.7)
Neutrophils Relative %: 60 %
Platelets: 255 10*3/uL (ref 150–400)
RBC: 4.73 MIL/uL (ref 4.22–5.81)
RDW: 16.7 % — ABNORMAL HIGH (ref 11.5–15.5)
WBC: 11 10*3/uL — ABNORMAL HIGH (ref 4.0–10.5)
nRBC: 0.2 % (ref 0.0–0.2)

## 2019-10-17 LAB — BRAIN NATRIURETIC PEPTIDE: B Natriuretic Peptide: 1622 pg/mL — ABNORMAL HIGH (ref 0.0–100.0)

## 2019-10-17 LAB — TROPONIN I (HIGH SENSITIVITY)
Troponin I (High Sensitivity): 49 ng/L — ABNORMAL HIGH (ref <18)
Troponin I (High Sensitivity): 69 ng/L — ABNORMAL HIGH (ref <18)

## 2019-10-17 LAB — RESPIRATORY PANEL BY RT PCR (FLU A&B, COVID)
Influenza A by PCR: NEGATIVE
Influenza B by PCR: NEGATIVE
SARS Coronavirus 2 by RT PCR: NEGATIVE

## 2019-10-17 LAB — POC SARS CORONAVIRUS 2 AG -  ED: SARS Coronavirus 2 Ag: NEGATIVE

## 2019-10-17 MED ORDER — RIVAROXABAN 20 MG PO TABS: 20.0000 mg | ORAL_TABLET | Freq: Every day | ORAL | Status: DC

## 2019-10-17 MED ORDER — SODIUM CHLORIDE 0.9 % IV SOLN: 250.0000 mL | INTRAVENOUS | Status: DC | PRN

## 2019-10-17 MED ORDER — CARVEDILOL 3.125 MG PO TABS
3.1250 mg | ORAL_TABLET | Freq: Two times a day (BID) | ORAL | Status: DC
  Filled 2019-10-17 (×6): qty 1

## 2019-10-17 MED ORDER — VITAMIN B-1 100 MG PO TABS
100.0000 mg | ORAL_TABLET | Freq: Every day | ORAL | Status: DC
  Administered 2019-10-18 – 2019-10-21 (×4): 100 mg via ORAL
  Filled 2019-10-17 (×4): qty 1

## 2019-10-17 MED ORDER — ONDANSETRON HCL 4 MG PO TABS: 4.0000 mg | ORAL_TABLET | Freq: Four times a day (QID) | ORAL | Status: DC | PRN

## 2019-10-17 MED ORDER — ORAL CARE MOUTH RINSE
15.0000 mL | Freq: Two times a day (BID) | OROMUCOSAL | Status: DC
  Administered 2019-10-18 – 2019-10-20 (×6): 15 mL via OROMUCOSAL

## 2019-10-17 MED ORDER — POLYETHYLENE GLYCOL 3350 17 G PO PACK: 17.0000 g | PACK | Freq: Every day | ORAL | Status: DC | PRN

## 2019-10-17 MED ORDER — FUROSEMIDE 10 MG/ML IJ SOLN
40.0000 mg | Freq: Once | INTRAMUSCULAR | Status: AC
  Administered 2019-10-17: 40 mg via INTRAVENOUS
  Filled 2019-10-17: qty 4

## 2019-10-17 MED ORDER — NITROGLYCERIN 0.4 MG SL SUBL: 0.4000 mg | SUBLINGUAL_TABLET | SUBLINGUAL | Status: DC | PRN

## 2019-10-17 MED ORDER — SODIUM CHLORIDE 0.9% FLUSH: 3.0000 mL | INTRAVENOUS | Status: DC | PRN

## 2019-10-17 MED ORDER — CHLORHEXIDINE GLUCONATE 0.12 % MT SOLN
15.0000 mL | Freq: Two times a day (BID) | OROMUCOSAL | Status: DC
  Administered 2019-10-18 – 2019-10-21 (×8): 15 mL via OROMUCOSAL
  Filled 2019-10-17 (×7): qty 15

## 2019-10-17 MED ORDER — ALBUTEROL SULFATE HFA 108 (90 BASE) MCG/ACT IN AERS
2.0000 | INHALATION_SPRAY | Freq: Four times a day (QID) | RESPIRATORY_TRACT | Status: DC | PRN
  Filled 2019-10-17: qty 6.7

## 2019-10-17 MED ORDER — BUPROPION HCL ER (SR) 150 MG PO TB12: 150.0000 mg | ORAL_TABLET | Freq: Three times a day (TID) | ORAL | Status: DC

## 2019-10-17 MED ORDER — SPIRONOLACTONE 25 MG PO TABS
12.5000 mg | ORAL_TABLET | Freq: Every day | ORAL | Status: DC
  Administered 2019-10-18 – 2019-10-21 (×5): 12.5 mg via ORAL
  Filled 2019-10-17 (×8): qty 1

## 2019-10-17 MED ORDER — SODIUM CHLORIDE 0.9% FLUSH
3.0000 mL | Freq: Two times a day (BID) | INTRAVENOUS | Status: DC
  Administered 2019-10-18 – 2019-10-21 (×8): 3 mL via INTRAVENOUS

## 2019-10-17 MED ORDER — ACETAMINOPHEN 650 MG RE SUPP: 650.0000 mg | Freq: Four times a day (QID) | RECTAL | Status: DC | PRN

## 2019-10-17 MED ORDER — BUPROPION HCL ER (SR) 150 MG PO TB12
150.0000 mg | ORAL_TABLET | Freq: Every day | ORAL | Status: DC
  Administered 2019-10-18 – 2019-10-21 (×4): 150 mg via ORAL
  Filled 2019-10-17 (×6): qty 1

## 2019-10-17 MED ORDER — ONDANSETRON HCL 4 MG/2ML IJ SOLN: 4.0000 mg | Freq: Four times a day (QID) | INTRAMUSCULAR | Status: DC | PRN

## 2019-10-17 MED ORDER — PANTOPRAZOLE SODIUM 40 MG PO TBEC
40.0000 mg | DELAYED_RELEASE_TABLET | Freq: Every day | ORAL | Status: DC
  Administered 2019-10-18 – 2019-10-21 (×4): 40 mg via ORAL
  Filled 2019-10-17 (×4): qty 1

## 2019-10-17 MED ORDER — SACUBITRIL-VALSARTAN 49-51 MG PO TABS
1.0000 | ORAL_TABLET | Freq: Two times a day (BID) | ORAL | Status: DC
  Administered 2019-10-18: 1 via ORAL
  Filled 2019-10-17: qty 1

## 2019-10-17 MED ORDER — LEVOTHYROXINE SODIUM 125 MCG PO TABS
125.0000 ug | ORAL_TABLET | Freq: Every day | ORAL | Status: DC
  Administered 2019-10-18 – 2019-10-21 (×4): 125 ug via ORAL
  Filled 2019-10-17 (×2): qty 2.5
  Filled 2019-10-17 (×2): qty 5
  Filled 2019-10-17 (×4): qty 2.5
  Filled 2019-10-17 (×2): qty 5
  Filled 2019-10-17: qty 2.5

## 2019-10-17 MED ORDER — ARIPIPRAZOLE 5 MG PO TABS
15.0000 mg | ORAL_TABLET | Freq: Every day | ORAL | Status: DC
  Administered 2019-10-18 – 2019-10-20 (×4): 15 mg via ORAL
  Filled 2019-10-17 (×4): qty 1

## 2019-10-17 MED ORDER — GUAIFENESIN ER 600 MG PO TB12
1200.0000 mg | ORAL_TABLET | Freq: Two times a day (BID) | ORAL | Status: DC
  Administered 2019-10-18 – 2019-10-21 (×8): 1200 mg via ORAL
  Filled 2019-10-17 (×8): qty 2

## 2019-10-17 MED ORDER — BISACODYL 10 MG RE SUPP: 10.0000 mg | Freq: Every day | RECTAL | Status: DC | PRN

## 2019-10-17 MED ORDER — QUETIAPINE FUMARATE 100 MG PO TABS
100.0000 mg | ORAL_TABLET | Freq: Every day | ORAL | Status: DC
  Administered 2019-10-18 – 2019-10-20 (×4): 100 mg via ORAL
  Filled 2019-10-17 (×4): qty 1

## 2019-10-17 MED ORDER — ACETAMINOPHEN 325 MG PO TABS: 650.0000 mg | ORAL_TABLET | Freq: Four times a day (QID) | ORAL | Status: DC | PRN

## 2019-10-17 MED ORDER — ATORVASTATIN CALCIUM 40 MG PO TABS
80.0000 mg | ORAL_TABLET | Freq: Every day | ORAL | Status: DC
  Administered 2019-10-18 – 2019-10-20 (×3): 80 mg via ORAL
  Filled 2019-10-17 (×3): qty 2

## 2019-10-17 MED ORDER — FUROSEMIDE 10 MG/ML IJ SOLN
40.0000 mg | Freq: Two times a day (BID) | INTRAMUSCULAR | Status: DC
  Administered 2019-10-18 – 2019-10-19 (×3): 40 mg via INTRAVENOUS
  Filled 2019-10-17 (×3): qty 4

## 2019-10-17 MED ORDER — IPRATROPIUM-ALBUTEROL 0.5-2.5 (3) MG/3ML IN SOLN
3.0000 mL | Freq: Once | RESPIRATORY_TRACT | Status: AC
  Administered 2019-10-17: 3 mL via RESPIRATORY_TRACT
  Filled 2019-10-17: qty 3

## 2019-10-17 MED ORDER — METHYLPREDNISOLONE SODIUM SUCC 125 MG IJ SOLR
125.0000 mg | Freq: Once | INTRAMUSCULAR | Status: AC
  Administered 2019-10-17: 125 mg via INTRAVENOUS
  Filled 2019-10-17: qty 2

## 2019-10-17 MED ORDER — ADULT MULTIVITAMIN W/MINERALS CH
1.0000 | ORAL_TABLET | Freq: Every morning | ORAL | Status: DC
  Administered 2019-10-18 – 2019-10-21 (×4): 1 via ORAL
  Filled 2019-10-17 (×4): qty 1

## 2019-10-17 MED ORDER — BUPROPION HCL ER (SR) 150 MG PO TB12
300.0000 mg | ORAL_TABLET | Freq: Every day | ORAL | Status: DC
  Administered 2019-10-18 – 2019-10-21 (×4): 300 mg via ORAL
  Filled 2019-10-17 (×6): qty 2

## 2019-10-17 MED ORDER — FOLIC ACID 1 MG PO TABS
1.0000 mg | ORAL_TABLET | Freq: Every day | ORAL | Status: DC
  Administered 2019-10-18 – 2019-10-21 (×4): 1 mg via ORAL
  Filled 2019-10-17 (×4): qty 1

## 2019-10-17 MED ORDER — CLOPIDOGREL BISULFATE 75 MG PO TABS
75.0000 mg | ORAL_TABLET | Freq: Every day | ORAL | Status: DC
  Administered 2019-10-18 – 2019-10-21 (×4): 75 mg via ORAL
  Filled 2019-10-17 (×4): qty 1

## 2019-10-17 MED ORDER — MAGNESIUM SULFATE 2 GM/50ML IV SOLN
2.0000 g | Freq: Once | INTRAVENOUS | Status: AC
  Administered 2019-10-17: 2 g via INTRAVENOUS
  Filled 2019-10-17: qty 50

## 2019-10-17 MED ORDER — TRAMADOL HCL 50 MG PO TABS: 50.0000 mg | ORAL_TABLET | Freq: Three times a day (TID) | ORAL | Status: DC | PRN

## 2019-10-17 MED ORDER — ALBUTEROL SULFATE (2.5 MG/3ML) 0.083% IN NEBU
2.5000 mg | INHALATION_SOLUTION | Freq: Once | RESPIRATORY_TRACT | Status: AC
  Administered 2019-10-17: 2.5 mg via RESPIRATORY_TRACT
  Filled 2019-10-17: qty 3

## 2019-10-17 NOTE — Progress Notes (Signed)
Patient assessed by RT. Current O2 sat 94% on 15 lpm HFNC. Patient states his is not having any difficulty breathing and feels good without the BIPAP on. Will continue to monitor.

## 2019-10-17 NOTE — Progress Notes (Signed)
RN called to report patient taken off BIPAP per MD and placed on HFNC.

## 2019-10-17 NOTE — ED Notes (Signed)
EKGs given to Dr Roderic Palau

## 2019-10-17 NOTE — ED Provider Notes (Signed)
[facility] EMERGENCY DEPARTMENT Provider Note   CSN: [id number] Arrival date & time: 10/17/19  1809     History   Chief Complaint Chief Complaint  Patient presents with  . Shortness of Breath    HPI Tony Alvarez is a 63 y.o. male.     Patient complains of shortness of breath.  The history is provided by the patient and medical records. No language interpreter was used.  Shortness of Breath Severity:  Moderate Onset quality:  Sudden Timing:  Constant Progression:  Worsening Chronicity:  Recurrent Context: activity   Relieved by:  Nothing Associated symptoms: no abdominal pain, no chest pain, no cough, no headaches and no rash     Past Medical History:  Diagnosis Date  . Barrett's esophagus 04/21/2018  . Depression   . High cholesterol   . Hypertension   . Hypothyroid   . Sleep apnea    wears CPAP at home    Patient Active Problem List   Diagnosis Date Noted  . Acute and chronic respiratory failure (acute-on-chronic) ([place]) 09/11/2019  . Sleep apnea   . Hypertension   . AKI (acute kidney injury) ([place])   . CAD (coronary artery disease) 06/24/2019  . Hypothyroid   . COPD (chronic obstructive pulmonary disease) ([place])   . Depression   . Hyperlipidemia 01/30/2019  . LV (left ventricular) mural thrombus with acute MI ([place]) 01/30/2019  . Alcohol abuse 01/30/2019  . Alcohol withdrawal delirium ([place]) 01/30/2019  . Elevated liver enzymes 01/30/2019  . Ischemic cardiomyopathy 01/30/2019  . Acute on chronic systolic CHF (congestive heart failure) ([place]) 01/30/2019  . History of colonic polyps 04/21/2018  . Barrett's esophagus 04/21/2018  . Barrett's esophagus without dysplasia 04/21/2018  . Rectal bleeding 04/21/2018    Past Surgical History:  Procedure Laterality Date  . BIOPSY  05/24/2018   Procedure: BIOPSY;  Surgeon: Rogene Houston, MD;  [facility];  Service: Endoscopy;;  gastric  . COLONOSCOPY    . COLONOSCOPY N/A 05/24/2018   Procedure:  COLONOSCOPY;  Surgeon: Rogene Houston, MD;  Location: [facility];  Service: Endoscopy;  Laterality: N/A;  8:55  . COLONOSCOPY WITH ESOPHAGOGASTRODUODENOSCOPY (EGD)    . CORONARY STENT INTERVENTION N/A 01/26/2019   Procedure: CORONARY STENT INTERVENTION;  Surgeon: Troy Sine, MD;  Location: [place] [facility];  Service: Cardiovascular;  Laterality: N/A;  . CORONARY/GRAFT ACUTE MI REVASCULARIZATION N/A 01/26/2019   Procedure: Coronary/Graft Acute MI Revascularization;  Surgeon: Troy Sine, MD;  Location: [place] [facility];  Service: Cardiovascular;  Laterality: N/A;  . ESOPHAGOGASTRODUODENOSCOPY N/A 05/24/2018   Procedure: ESOPHAGOGASTRODUODENOSCOPY (EGD);  Surgeon: Rogene Houston, MD;  [facility];  Service: Endoscopy;  Laterality: N/A;  . LEFT HEART CATH AND CORONARY ANGIOGRAPHY N/A 01/26/2019   Procedure: LEFT HEART CATH AND CORONARY ANGIOGRAPHY;  Surgeon: Troy Sine, MD;  Location: [place] [facility];  Service: Cardiovascular;  Laterality: N/A;  . MEDIAL PARTIAL KNEE REPLACEMENT     2015  . POLYPECTOMY  05/24/2018   Procedure: POLYPECTOMY;  Surgeon: Rogene Houston, MD;  [facility];  Service: Endoscopy;;  colon  . rt hand injury    . shoulder surgery for bursitis    . TONSILLECTOMY          Home Medications    Prior to Admission medications   Medication Sig Start Date End Date Taking? Authorizing Provider  acetaminophen (TYLENOL) 325 MG tablet Take 2 tablets (650 mg total) by mouth  every 6 (six) hours as needed for mild pain, fever or headache (or Fever >/= 101). 09/13/19   Roxan Hockey, MD  albuterol (VENTOLIN HFA) 108 (90 Base) MCG/ACT inhaler Inhale 2 puffs into the lungs every 6 (six) hours as needed for wheezing or shortness of breath. 09/13/19   Roxan Hockey, MD  ARIPiprazole (ABILIFY) 15 MG tablet Take 15 mg by mouth at bedtime. 09/01/19   [provider]  atorvastatin (LIPITOR) 80 MG tablet Take 1 tablet (80 mg total)  by mouth daily at 6 PM. 01/30/19   Almyra Deforest, PA  buPROPion [facility] SR) 150 MG 12 hr tablet Take 150 mg by mouth 3 (three) times daily. Take two tablets (300 mg) by mouth every morning and one tablet (150 mg) with lunch 03/27/18   [provider]  carvedilol (COREG) 3.125 MG tablet Take 1 tablet (3.125 mg total) by mouth 2 (two) times daily with a meal. 01/30/19   Almyra Deforest, PA  cefdinir (OMNICEF) 300 MG capsule  09/20/19   [provider]  clopidogrel (PLAVIX) 75 MG tablet Take 1 tablet (75 mg total) by mouth daily. 01/31/19   Almyra Deforest, PA  doxycycline (VIBRA-TABS) 100 MG tablet  09/20/19   [provider]  Eszopiclone 3 MG TABS Take 3 mg by mouth at bedtime. 09/01/19   [provider]  folic acid (FOLVITE) 1 MG tablet Take 1 tablet (1 mg total) by mouth daily. 09/14/19   Roxan Hockey, MD  furosemide (LASIX) 20 MG tablet Take 1 tablet (20 mg total) by mouth daily. 10/10/19   Duke, Tami Lin, PA  guaiFENesin (MUCINEX) 600 MG 12 hr tablet Take 2 tablets (1,200 mg total) by mouth 2 (two) times daily. 09/13/19   Roxan Hockey, MD  levothyroxine (SYNTHROID) 125 MCG tablet Take 125 mcg by mouth as needed.    [provider]  Multiple Vitamin (MULTIVITAMIN WITH MINERALS) TABS tablet Take 1 tablet by mouth every morning.    [provider]  nitroGLYCERIN (NITROSTAT) 0.4 MG SL tablet Place 1 tablet (0.4 mg total) under the tongue every 5 (five) minutes x 3 doses as needed for chest pain. 01/30/19   Almyra Deforest, PA  pantoprazole (PROTONIX) 40 MG tablet Take 1 tablet (40 mg total) by mouth daily. 09/13/19 09/12/20  Roxan Hockey, MD  predniSONE (DELTASONE) 20 MG tablet  09/20/19   [provider]  QUEtiapine (SEROQUEL) 100 MG tablet TAKE 1 2 (ONE HALF) TABLET BY MOUTH ONCE DAILY AT BEDTIME. MAY RAISE TO 1 TALBET AT BEDTIME 10/04/19   [provider]  rivaroxaban (XARELTO) 20 MG TABS tablet Take 1 tablet (20 mg total) by mouth  daily. 01/31/19   Almyra Deforest, PA  sacubitril-valsartan (ENTRESTO) 49-51 MG Take 1 tablet by mouth 2 (two) times daily. 07/08/19   Troy Sine, MD  spironolactone (ALDACTONE) 25 MG tablet Take 0.5 tablets (12.5 mg total) by mouth daily. 08/29/19   Troy Sine, MD  thiamine 100 MG tablet Take 1 tablet (100 mg total) by mouth daily. 09/14/19   Emokpae, Courage, MD  VIIBRYD 20 MG TABS TAKE 1 2 (ONE HALF) TABLET BY MOUTH ONCE DAILY IN THE EVENING AFTER FOOD FOR 7 DAYS THEN 1 TABLET ONCE DAILY IN THE EVENING THEREAFTER 09/05/19   [provider]    Family History Family History  Problem Relation Age of Onset  . Colon cancer Neg Hx     Social History Social History   Tobacco Use  . Smoking status: Current  Every Day Smoker    Packs/day: 0.25    Years: 40.00    Pack years: 10.00    Types: Cigarettes  . Smokeless tobacco: Never Used  . Tobacco comment: 7 cigarettes per day 05/25/19  Substance Use Topics  . Alcohol use: Yes    Frequency: Never    Comment: very rare  . Drug use: Never     Allergies   Patient has no known allergies.   Review of Systems Review of Systems  Constitutional: Negative for appetite change and fatigue.  HENT: Negative for congestion, ear discharge and sinus pressure.   Eyes: Negative for discharge.  Respiratory: Positive for shortness of breath. Negative for cough.   Cardiovascular: Negative for chest pain.  Gastrointestinal: Negative for abdominal pain and diarrhea.  Genitourinary: Negative for frequency and hematuria.  Musculoskeletal: Negative for back pain.  Skin: Negative for rash.  Neurological: Negative for seizures and headaches.  Psychiatric/Behavioral: Negative for hallucinations.     Physical Exam Updated Vital Signs BP 113/78   Pulse 99   Resp 10   Ht 5\' 10"  (1.778 m)   Wt 112 kg   SpO2 94%   BMI 35.44 kg/m   Physical Exam Vitals signs and nursing note reviewed.  Constitutional:      Appearance: He is  well-developed.  HENT:     Head: Normocephalic.     Nose: Nose normal.  Eyes:     General: No scleral icterus.    Conjunctiva/sclera: Conjunctivae normal.  Neck:     Musculoskeletal: Neck supple.     Thyroid: No thyromegaly.  Cardiovascular:     Heart sounds: No murmur. No friction rub. No gallop.      Comments: Tachycardia Pulmonary:     Breath sounds: No stridor. No wheezing or rales.     Comments: Tachypneic Chest:     Chest wall: No tenderness.  Abdominal:     General: There is no distension.     Tenderness: There is no abdominal tenderness. There is no rebound.  Musculoskeletal: Normal range of motion.  Lymphadenopathy:     Cervical: No cervical adenopathy.  Skin:    Findings: No erythema or rash.  Neurological:     Mental Status: He is oriented to person, place, and time.     Motor: No abnormal muscle tone.     Coordination: Coordination normal.  Psychiatric:        Behavior: Behavior normal.      ED Treatments / Results  Labs (all labs ordered are listed, but only abnormal results are displayed) Labs Reviewed  CBC WITH DIFFERENTIAL/PLATELET - Abnormal; Notable for the following components:      Result Value   WBC 11.0 (*)    RDW 16.7 (*)    All other components within normal limits  COMPREHENSIVE METABOLIC PANEL - Abnormal; Notable for the following components:   Potassium 3.3 (*)    CO2 17 (*)    Glucose, Bld 232 (*)    Creatinine, Ser 1.64 (*)    GFR calc non Af Amer 44 (*)    GFR calc Af Amer 52 (*)    Anion gap 16 (*)    All other components within normal limits  BRAIN NATRIURETIC PEPTIDE - Abnormal; Notable for the following components:   B Natriuretic Peptide 1,622.0 (*)    All other components within normal limits  TROPONIN I (HIGH SENSITIVITY) - Abnormal; Notable for the following components:   Troponin I (High Sensitivity) 49 (*)  All other components within normal limits  POC SARS CORONAVIRUS 2 AG -  ED  TROPONIN I (HIGH SENSITIVITY)     EKG EKG Interpretation  Date/Time:  Monday October 17 2019 18:39:02 EST Ventricular Rate:  167 PR Interval:    QRS Duration: 184 QT Interval:  303 QTC Calculation: 506 R Axis:   100 Text Interpretation: Extreme tachycardia with wide complex, no further rhythm analysis attempted Confirmed by Milton Ferguson [phone]) on 10/17/2019 6:41:31 PM   Radiology Dg Chest Portable 1 View  Result Date: 10/17/2019 CLINICAL DATA:  Shortness of breath EXAM: PORTABLE CHEST 1 VIEW COMPARISON:  Radiograph 09/11/2019 FINDINGS: There is diffuse hazy opacity throughout both lungs with cephalized, indistinct pulmonary vascularity and likely trace left effusion tracking in the fissure. More patchy airspace opacities are present in the periphery of the lungs. Cardiomegaly is similar to slightly increased from comparison portable radiograph. Calcified aorta is similar to prior. No pneumothorax. No acute osseous or soft tissue abnormality. Degenerative changes are present in the imaged spine and shoulders. IMPRESSION: 1. Findings consistent with moderate to severe CHF. 2. Cardiomegaly similar to slightly increased from prior. 3. More patchy airspace opacities in the periphery of the lungs could represent alveolar edema or pneumonia. 4. Trace left pleural effusion. Aortic Atherosclerosis (ICD10-I70.0). Electronically Signed   By: Lovena Le M.D.   On: 10/17/2019 19:31    Procedures Procedures (including critical care time)  Medications Ordered in ED Medications  methylPREDNISolone sodium succinate (SOLU-MEDROL) 125 mg/2 mL injection 125 mg (125 mg Intravenous Given 10/17/19 1849)  ipratropium-albuterol (DUONEB) 0.5-2.5 (3) MG/3ML nebulizer solution 3 mL (3 mLs Nebulization Given 10/17/19 1933)  albuterol (PROVENTIL) (2.5 MG/3ML) 0.083% nebulizer solution 2.5 mg (2.5 mg Nebulization Given 10/17/19 1933)  magnesium sulfate IVPB 2 g 50 mL (0 g Intravenous Stopped 10/17/19 1943)  furosemide (LASIX) injection 40 mg (40 mg  Intravenous Given 10/17/19 1930)  furosemide (LASIX) injection 40 mg (40 mg Intravenous Given 10/17/19 2027)     Initial Impression / Assessment and Plan / ED Course  I have reviewed the triage vital signs and the nursing notes.  Pertinent labs & imaging results that were available during my care of the patient were reviewed by me and considered in my medical decision making (see chart for details).       CRITICAL CARE Performed by: Milton Ferguson Total critical care time:42minutes Critical care time was exclusive of separately billable procedures and treating other patients. Critical care was necessary to treat or prevent imminent or life-threatening deterioration. Critical care was time spent personally by me on the following activities: development of treatment plan with patient and/or surrogate as well as nursing, discussions with consultants, evaluation of patient's response to treatment, examination of patient, obtaining history from patient or surrogate, ordering and performing treatments and interventions, ordering and review of laboratory studies, ordering and review of radiographic studies, pulse oximetry and re-evaluation of patient's condition. Patient with significant dyspnea and tachypnea.  He was in congestive heart failure and was put on BiPAP.  The patient improved and was given Lasix and continue to improve.  He was then put on high flow nasal and was admitted for congestive heart failure Final Clinical Impressions(s) / ED Diagnoses   Final diagnoses:  Hypoxia  Systolic congestive heart failure, unspecified HF chronicity [facility])    ED Discharge Orders    None       Milton Ferguson, MD 10/17/19 2104

## 2019-10-17 NOTE — Telephone Encounter (Signed)
Pt c/o Shortness Of Breath: STAT if SOB developed within the last 24 hours or pt is noticeably SOB on the phone  1. Are you currently SOB (can you hear that pt is SOB on the phone)? yes  2. How long have you been experiencing SOB? Since he woke up this morning   3. Are you SOB when sitting or when up moving around? All the time   4. Are you currently experiencing any other symptoms? No  Patient is concerned be cause the last time he was SOB like this he ended up in the [facility]

## 2019-10-17 NOTE — H&P (Addendum)
History and Physical    Tony Alvarez [id number] DOB: 20-May-1956 DOA: 10/17/2019  PCP: A, [facility]  Patient coming from: Home  I have personally briefly reviewed patient's old medical records in [place]  Chief Complaint: Shortness of breath  HPI: Tony Alvarez is a 63 y.o. male with medical history significant of CAD, ischemic cardiomyopathy, COPD, OSA, hypertension, hyperlipidemia, CHF, chronic alcoholism with prior withdrawal, Barretts esophagus, hypothyroidism and depression who presented to the ER with acute shortness of breath.  Patient states he started having increased dyspnea on exertion tonight.  Did report some chest discomfort but no obvious pain.  Does have a history of chronic CHF and also reports slightly increased lower extremity swelling.  Has been taking his medications as prescribed.  No dietary noncompliance reported.  Noted to be significantly hypoxemic, tachypneic and tachycardic on presentation.  Initially was placed on BiPAP and given Lasix in the ER and had improvement in respiratory symptoms.  Subsequently placed on high flow nasal cannula. No fever, chills, cough, nausea, vomiting, abdominal pain. ED Course:  Vital Signs reviewed on presentation, significant for patient is afebrile, heart rate 102, blood pressure 113/78, saturation 94% on 15 L high flow nasal cannula. Labs reviewed, significant for sodium 141, potassium 3.3, bicarb 17, BUN 19, creatinine 1.64, LFTs within normal limits, BNP 1622, troponin 49, WBC count 11.0, hemoglobin 14.6, hematocrit 45, platelets 255, SARS-CoV-2 antigen screen is negative. Imaging personally Reviewed, chest x-ray shows moderate to severe CHF.  Cardiomegaly appears to have increased, more patchy airspace opacities bilaterally likely secondary to pulmonary edema.  Trace left pleural effusion. EKG personally reviewed, shows atrial fibrillation, left bundle branch block, IVCD.  Review of Systems: As per HPI  otherwise 10 point review of systems negative.  All other review of systems is negative except the ones noted above in the HPI.  Past Medical History:  Diagnosis Date  . Barrett's esophagus 04/21/2018  . Depression   . High cholesterol   . Hypertension   . Hypothyroid   . Sleep apnea    wears CPAP at home    Past Surgical History:  Procedure Laterality Date  . BIOPSY  05/24/2018   Procedure: BIOPSY;  Surgeon: Rogene Houston, MD;  [facility];  Service: Endoscopy;;  gastric  . COLONOSCOPY    . COLONOSCOPY N/A 05/24/2018   Procedure: COLONOSCOPY;  Surgeon: Rogene Houston, MD;  [facility];  Service: Endoscopy;  Laterality: N/A;  8:55  . COLONOSCOPY WITH ESOPHAGOGASTRODUODENOSCOPY (EGD)    . CORONARY STENT INTERVENTION N/A 01/26/2019   Procedure: CORONARY STENT INTERVENTION;  Surgeon: Troy Sine, MD;  Location: [place] [facility];  Service: Cardiovascular;  Laterality: N/A;  . CORONARY/GRAFT ACUTE MI REVASCULARIZATION N/A 01/26/2019   Procedure: Coronary/Graft Acute MI Revascularization;  Surgeon: Troy Sine, MD;  Location: [place] [facility];  Service: Cardiovascular;  Laterality: N/A;  . ESOPHAGOGASTRODUODENOSCOPY N/A 05/24/2018   Procedure: ESOPHAGOGASTRODUODENOSCOPY (EGD);  Surgeon: Rogene Houston, MD;  [facility];  Service: Endoscopy;  Laterality: N/A;  . LEFT HEART CATH AND CORONARY ANGIOGRAPHY N/A 01/26/2019   Procedure: LEFT HEART CATH AND CORONARY ANGIOGRAPHY;  Surgeon: Troy Sine, MD;  Location: [place] [facility];  Service: Cardiovascular;  Laterality: N/A;  . MEDIAL PARTIAL KNEE REPLACEMENT     2015  . POLYPECTOMY  05/24/2018   Procedure: POLYPECTOMY;  Surgeon: Rogene Houston, MD;  [facility];  Service: Endoscopy;;  colon  .  rt hand injury    . shoulder surgery for bursitis    . TONSILLECTOMY       reports that he has been smoking cigarettes. He has a 10.00 pack-year smoking history. He has never used  smokeless tobacco. He reports current alcohol use. He reports that he does not use drugs.  No Known Allergies  Family History  Problem Relation Age of Onset  . Colon cancer Neg Hx    Family history reviewed, noted as above, not pertinent to current presentation.   Prior to Admission medications   Medication Sig Start Date End Date Taking? Authorizing Provider  acetaminophen (TYLENOL) 325 MG tablet Take 2 tablets (650 mg total) by mouth every 6 (six) hours as needed for mild pain, fever or headache (or Fever >/= 101). 09/13/19   Roxan Hockey, MD  albuterol (VENTOLIN HFA) 108 (90 Base) MCG/ACT inhaler Inhale 2 puffs into the lungs every 6 (six) hours as needed for wheezing or shortness of breath. 09/13/19   Roxan Hockey, MD  ARIPiprazole (ABILIFY) 15 MG tablet Take 15 mg by mouth at bedtime. 09/01/19   [provider]  atorvastatin (LIPITOR) 80 MG tablet Take 1 tablet (80 mg total) by mouth daily at 6 PM. 01/30/19   Almyra Deforest, PA  buPROPion [facility] SR) 150 MG 12 hr tablet Take 150 mg by mouth 3 (three) times daily. Take two tablets (300 mg) by mouth every morning and one tablet (150 mg) with lunch 03/27/18   [provider]  carvedilol (COREG) 3.125 MG tablet Take 1 tablet (3.125 mg total) by mouth 2 (two) times daily with a meal. 01/30/19   Almyra Deforest, PA  cefdinir (OMNICEF) 300 MG capsule  09/20/19   [provider]  clopidogrel (PLAVIX) 75 MG tablet Take 1 tablet (75 mg total) by mouth daily. 01/31/19   Almyra Deforest, PA  doxycycline (VIBRA-TABS) 100 MG tablet  09/20/19   [provider]  Eszopiclone 3 MG TABS Take 3 mg by mouth at bedtime. 09/01/19   [provider]  folic acid (FOLVITE) 1 MG tablet Take 1 tablet (1 mg total) by mouth daily. 09/14/19   Roxan Hockey, MD  furosemide (LASIX) 20 MG tablet Take 1 tablet (20 mg total) by mouth daily. 10/10/19   Duke, Tami Lin, PA  guaiFENesin (MUCINEX) 600 MG 12 hr tablet Take 2 tablets (1,200  mg total) by mouth 2 (two) times daily. 09/13/19   Roxan Hockey, MD  levothyroxine (SYNTHROID) 125 MCG tablet Take 125 mcg by mouth as needed.    [provider]  Multiple Vitamin (MULTIVITAMIN WITH MINERALS) TABS tablet Take 1 tablet by mouth every morning.    [provider]  nitroGLYCERIN (NITROSTAT) 0.4 MG SL tablet Place 1 tablet (0.4 mg total) under the tongue every 5 (five) minutes x 3 doses as needed for chest pain. 01/30/19   Almyra Deforest, PA  pantoprazole (PROTONIX) 40 MG tablet Take 1 tablet (40 mg total) by mouth daily. 09/13/19 09/12/20  Roxan Hockey, MD  predniSONE (DELTASONE) 20 MG tablet  09/20/19   [provider]  QUEtiapine (SEROQUEL) 100 MG tablet TAKE 1 2 (ONE HALF) TABLET BY MOUTH ONCE DAILY AT BEDTIME. MAY RAISE TO 1 TALBET AT BEDTIME 10/04/19   [provider]  rivaroxaban (XARELTO) 20 MG TABS tablet Take 1 tablet (20 mg total) by mouth daily. 01/31/19   Almyra Deforest, PA  sacubitril-valsartan (ENTRESTO) 49-51 MG Take 1 tablet by mouth 2 (two) times daily. 07/08/19   Claiborne Billings,  Joyice Faster, MD  spironolactone (ALDACTONE) 25 MG tablet Take 0.5 tablets (12.5 mg total) by mouth daily. 08/29/19   Troy Sine, MD  thiamine 100 MG tablet Take 1 tablet (100 mg total) by mouth daily. 09/14/19   Emokpae, Courage, MD  VIIBRYD 20 MG TABS TAKE 1 2 (ONE HALF) TABLET BY MOUTH ONCE DAILY IN THE EVENING AFTER FOOD FOR 7 DAYS THEN 1 TABLET ONCE DAILY IN THE EVENING THEREAFTER 09/05/19   [provider]    Physical Exam: Vitals:   10/17/19 1856 10/17/19 1900 10/17/19 1930 10/17/19 1933  BP:  116/88 113/78   Pulse: (!) 126 (!) 111 95   Resp: (!) 34 (!) 28    SpO2: 93% 95% 93% 93%  Weight:      Height:        Constitutional: NAD, calm, comfortable Vitals:   10/17/19 1856 10/17/19 1900 10/17/19 1930 10/17/19 1933  BP:  116/88 113/78   Pulse: (!) 126 (!) 111 95   Resp: (!) 34 (!) 28    SpO2: 93% 95% 93% 93%  Weight:      Height:       Eyes:  PERRL, lids and conjunctivae normal ENMT: Mucous membranes are moist. Posterior pharynx clear of any exudate or lesions.Normal dentition.  Neck: normal, supple, no masses, no thyromegaly Respiratory: Bilateral basal crepitations, mild accessory muscle use, mild respiratory distress, decreased air entry at bases. Cardiovascular: Regular rate and rhythm, no murmurs / rubs / gallops.  Bilateral 2+ extremity edema. 2+ pedal pulses. No carotid bruits.  Abdomen: no tenderness, no masses palpated. No hepatosplenomegaly. Bowel sounds positive.  Musculoskeletal: no clubbing / cyanosis. No joint deformity upper and lower extremities. Good ROM, no contractures. Normal muscle tone.  Skin: no rashes, lesions, ulcers. No induration Neurologic: CN 2-12 grossly intact. Sensation intact, DTR normal. Strength 5/5 in all 4.  Psychiatric: Normal judgment and insight. Alert and oriented x 3. Normal mood.    Decubitus Ulcers: Not present on admission Catheters and tubes: None   Labs on Admission: I have personally reviewed following labs and imaging studies  CBC: Recent Labs  Lab 10/17/19 1847  WBC 11.0*  NEUTROABS 6.7  HGB 14.6  HCT 45.7  MCV 96.6  PLT 123456   Basic Metabolic Panel: Recent Labs  Lab 10/17/19 1847  NA 141  K 3.3*  CL 108  CO2 17*  GLUCOSE 232*  BUN 19  CREATININE 1.64*  CALCIUM 9.4   GFR: Estimated Creatinine Clearance: 59.3 mL/min (A) (by C-G formula based on SCr of 1.64 mg/dL (H)). Liver Function Tests: Recent Labs  Lab 10/17/19 1847  AST 22  ALT 19  ALKPHOS 82  BILITOT 1.2  PROT 7.7  ALBUMIN 4.4   No results for input(s): LIPASE, AMYLASE in the last 168 hours. No results for input(s): AMMONIA in the last 168 hours. Coagulation Profile: No results for input(s): INR, PROTIME in the last 168 hours. Cardiac Enzymes: No results for input(s): CKTOTAL, CKMB, CKMBINDEX, TROPONINI in the last 168 hours. BNP (last 3 results) Recent Labs    07/26/19 1102  PROBNP 992*    HbA1C: No results for input(s): HGBA1C in the last 72 hours. CBG: No results for input(s): GLUCAP in the last 168 hours. Lipid Profile: No results for input(s): CHOL, HDL, LDLCALC, TRIG, CHOLHDL, LDLDIRECT in the last 72 hours. Thyroid Function Tests: No results for input(s): TSH, T4TOTAL, FREET4, T3FREE, THYROIDAB in the last 72 hours. Anemia Panel: No results for input(s): VITAMINB12, FOLATE, FERRITIN,  TIBC, IRON, RETICCTPCT in the last 72 hours. Urine analysis: No results found for: COLORURINE, APPEARANCEUR, LABSPEC, PHURINE, GLUCOSEU, HGBUR, BILIRUBINUR, KETONESUR, PROTEINUR, UROBILINOGEN, NITRITE, LEUKOCYTESUR  Radiological Exams on Admission: Dg Chest Portable 1 View  Result Date: 10/17/2019 CLINICAL DATA:  Shortness of breath EXAM: PORTABLE CHEST 1 VIEW COMPARISON:  Radiograph 09/11/2019 FINDINGS: There is diffuse hazy opacity throughout both lungs with cephalized, indistinct pulmonary vascularity and likely trace left effusion tracking in the fissure. More patchy airspace opacities are present in the periphery of the lungs. Cardiomegaly is similar to slightly increased from comparison portable radiograph. Calcified aorta is similar to prior. No pneumothorax. No acute osseous or soft tissue abnormality. Degenerative changes are present in the imaged spine and shoulders. IMPRESSION: 1. Findings consistent with moderate to severe CHF. 2. Cardiomegaly similar to slightly increased from prior. 3. More patchy airspace opacities in the periphery of the lungs could represent alveolar edema or pneumonia. 4. Trace left pleural effusion. Aortic Atherosclerosis (ICD10-I70.0). Electronically Signed   By: Lovena Le M.D.   On: 10/17/2019 19:31      Assessment/Plan Active Problems:   Barrett's esophagus   Hyperlipidemia   Alcohol abuse   Ischemic cardiomyopathy   Acute on chronic systolic CHF (congestive heart failure) (HCC)   CAD (coronary artery disease)   Hypothyroid   COPD (chronic  obstructive pulmonary disease) (HCC)   Depression   Sleep apnea   Hypertension     Principal Problem: Acute on chronic congestive heart failure with reduced ejection fraction: Patient presented with increased shortness of breath.  Has an elevated BNP at 1622 which appears to be increased from prior baseline of around 700-900.  Troponin is slightly elevated. Most recent echocardiogram on 08/17/2019 shows LVEF of 25 to 30%, severely depressed LV systolic function, there is also some LV diastolic dysfunction with a diagnosis of mid to distal anteroseptal wall and apical lateral lesions.  Moderate mitral valve regurgitation noted. Plan: Telemetry monitoring Serial troponins We will place on IV Lasix Continue spironolactone, Entresto Monitor input up, renal function, daily weights  Other Active Problems: Elevated troponin: High-sensitivity troponin is at 49, noted to be marginally elevated, likely demand ischemia from underlying CHF.  EKG shows no acute changes, baseline left bundle branch block. We will continue telemetry monitoring and serial troponins.  Acute hypoxemic respiratory failure: Likely secondary to CHF exacerbation with pulmonary edema.  Initially requiring BiPAP.  Currently on high flow nasal cannula.  Will titrate as needed.  Mild hypokalemia: Likely secondary to diuretic use, monitor and supplement electrolytes  Chronic kidney disease stage III: Creatinine is 1.6 on presentation.  Appears to be relatively close to baseline of around 1.5-1.6.  Most recent creatinine was 1.54 on 09/13/2019. We will continue to monitor renal function closely.  Hypertension: Continue carvedilol  Hyperlipidemia: Continue atorvastatin  COPD: Not in acute exacerbation Albuterol as needed  Depression: Continue bupropion, Seroquel  Obstructive sleep apnea: Continue CPAP at night  Hypothyroidism: Continue levothyroxine  Gastroesophageal reflux disease: Continue pantoprazole     DVT prophylaxis: Heparin subcu Code Status:  Full code Family Communication: N/A  Disposition Plan: Admitted as inpatient for CHF exacerbation.  Expect 3-day stay for IV diuresis Consults called: N/A Admission status: Inpatient   Lynetta Mare MD [facility]  If 7PM-7AM, please contact night-coverage   10/17/2019, 8:55 PM

## 2019-10-17 NOTE — Telephone Encounter (Signed)
Spoke with pt and is SOB today Per pt weight is up 2 lbs in 2 days  B/P 104/59 HR 91 Pt was recently seen in the B/P clinic and Spironolactone was decreased to 1/2 tab a day and was seen a couple of weeks before that and Lasix was decreased to 20 mg every day Will forward message to Dr Claiborne Billings and [facility] for review and recommendations./cy

## 2019-10-17 NOTE — ED Notes (Signed)
ED TO INPATIENT HANDOFF REPORT  ED Nurse Name and Phone #: [phone]  S Name/Age/Gender Tony Alvarez 63 y.o. male Room/Bed: APA07/APA07  Code Status   Code Status: Prior  Home/SNF/Other Home Patient oriented to: situation Is this baseline? Yes   Triage Complete: Triage complete  Chief Complaint Shortnes of Breath  Triage Note Pt presents to ED with SOB started 1 hour ago.    Allergies No Known Allergies  Level of Care/Admitting Diagnosis ED Disposition    ED Disposition Condition [place] [facility] Area: [facility] [[id number]]  Level of Care: Stepdown [14]  Covid Evaluation: Asymptomatic Screening Protocol (No Symptoms)  Diagnosis: CHF exacerbation [facility]) [[id number]]  Admitting Physician: Lynetta Mare [[id number]]  Attending Physician: Lynetta Mare [[id number]]  Estimated length of stay: 3 - 4 days  Certification:: I certify this patient will need inpatient services for at least 2 midnights  PT Class (Do Not Modify): Inpatient [101]  PT Acc Code (Do Not Modify): Private [1]       B Medical/Surgery History Past Medical History:  Diagnosis Date  . Barrett's esophagus 04/21/2018  . Depression   . High cholesterol   . Hypertension   . Hypothyroid   . Sleep apnea    wears CPAP at home   Past Surgical History:  Procedure Laterality Date  . BIOPSY  05/24/2018   Procedure: BIOPSY;  Surgeon: Rogene Houston, MD;  [facility];  Service: Endoscopy;;  gastric  . COLONOSCOPY    . COLONOSCOPY N/A 05/24/2018   Procedure: COLONOSCOPY;  Surgeon: Rogene Houston, MD;  [facility];  Service: Endoscopy;  Laterality: N/A;  8:55  . COLONOSCOPY WITH ESOPHAGOGASTRODUODENOSCOPY (EGD)    . CORONARY STENT INTERVENTION N/A 01/26/2019   Procedure: CORONARY STENT INTERVENTION;  Surgeon: Troy Sine, MD;  Location: [place] [facility] LAB;  Service: Cardiovascular;  Laterality: N/A;  . CORONARY/GRAFT ACUTE MI REVASCULARIZATION N/A 01/26/2019   Procedure: Coronary/Graft Acute MI Revascularization;  Surgeon: Troy Sine, MD;  Location: [place] [facility];  Service: Cardiovascular;  Laterality: N/A;  . ESOPHAGOGASTRODUODENOSCOPY N/A 05/24/2018   Procedure: ESOPHAGOGASTRODUODENOSCOPY (EGD);  Surgeon: Rogene Houston, MD;  [facility];  Service: Endoscopy;  Laterality: N/A;  . LEFT HEART CATH AND CORONARY ANGIOGRAPHY N/A 01/26/2019   Procedure: LEFT HEART CATH AND CORONARY ANGIOGRAPHY;  Surgeon: Troy Sine, MD;  Location: [place] [facility];  Service: Cardiovascular;  Laterality: N/A;  . MEDIAL PARTIAL KNEE REPLACEMENT     2015  . POLYPECTOMY  05/24/2018   Procedure: POLYPECTOMY;  Surgeon: Rogene Houston, MD;  [facility];  Service: Endoscopy;;  colon  . rt hand injury    . shoulder surgery for bursitis    . TONSILLECTOMY       A IV Location/Drains/Wounds Patient Lines/Drains/Airways Status   Active Line/Drains/Airways    Name:   Placement date:   Placement time:   Site:   Days:   Peripheral IV 09/11/19 Left Hand   09/11/19    0912    Hand   36   Peripheral IV 09/11/19 Right Hand   09/11/19    0912    Hand   36   Peripheral IV 10/17/19 Left Antecubital   10/17/19    1845    Antecubital   less than 1   Peripheral IV 10/17/19 Right Hand   10/17/19    1846    Hand   less than 1  Intake/Output Last 24 hours  Intake/Output Summary (Last 24 hours) at 10/17/2019 2137 Last data filed at 10/17/2019 2135 Gross per 24 hour  Intake -  Output 1050 ml  Net -1050 ml    Labs/Imaging Results for orders placed or performed during the hospital encounter of 10/17/19 (from the past 48 hour(s))  CBC with Differential     Status: Abnormal   Collection Time: 10/17/19  6:47 PM  Result Value Ref Range   WBC 11.0 (H) 4.0 - 10.5 K/uL   RBC 4.73 4.22 - 5.81 MIL/uL   Hemoglobin 14.6 13.0 - 17.0 g/dL   HCT 45.7 39.0 - 52.0 %   MCV 96.6 80.0 - 100.0 fL   MCH 30.9 26.0 - 34.0 pg   MCHC 31.9 30.0 - 36.0  g/dL   RDW 16.7 (H) 11.5 - 15.5 %   Platelets 255 150 - 400 K/uL   nRBC 0.2 0.0 - 0.2 %   Neutrophils Relative % 60 %   Neutro Abs 6.7 1.7 - 7.7 K/uL   Lymphocytes Relative 31 %   Lymphs Abs 3.4 0.7 - 4.0 K/uL   Monocytes Relative 5 %   Monocytes Absolute 0.6 0.1 - 1.0 K/uL   Eosinophils Relative 3 %   Eosinophils Absolute 0.3 0.0 - 0.5 K/uL   Basophils Relative 1 %   Basophils Absolute 0.1 0.0 - 0.1 K/uL   Immature Granulocytes 0 %   Abs Immature Granulocytes 0.03 0.00 - 0.07 K/uL    Comment: Performed at [facility], [address] [place], [place] [address]  Comprehensive metabolic panel     Status: Abnormal   Collection Time: 10/17/19  6:47 PM  Result Value Ref Range   Sodium 141 135 - 145 mmol/L   Potassium 3.3 (L) 3.5 - 5.1 mmol/L   Chloride 108 98 - 111 mmol/L   CO2 17 (L) 22 - 32 mmol/L   Glucose, Bld 232 (H) 70 - 99 mg/dL   BUN 19 8 - 23 mg/dL   Creatinine, Ser 1.64 (H) 0.61 - 1.24 mg/dL   Calcium 9.4 8.9 - 10.3 mg/dL   Total Protein 7.7 6.5 - 8.1 g/dL   Albumin 4.4 3.5 - 5.0 g/dL   AST 22 15 - 41 U/L   ALT 19 0 - 44 U/L   Alkaline Phosphatase 82 38 - 126 U/L   Total Bilirubin 1.2 0.3 - 1.2 mg/dL   GFR calc non Af Amer 44 (L) >60 mL/min   GFR calc Af Amer 52 (L) >60 mL/min   Anion gap 16 (H) 5 - 15    Comment: Performed at [facility], [address], [place], [place] [address]  Brain natriuretic peptide     Status: Abnormal   Collection Time: 10/17/19  6:47 PM  Result Value Ref Range   B Natriuretic Peptide 1,622.0 (H) 0.0 - 100.0 pg/mL    Comment: Performed at [facility], [address], [place], [place] [address]  Troponin I (High Sensitivity)     Status: Abnormal   Collection Time: 10/17/19  6:47 PM  Result Value Ref Range   Troponin I (High Sensitivity) 49 (H) <18 ng/L    Comment: (NOTE) Elevated high sensitivity troponin I (hsTnI) values and significant  changes across serial measurements may suggest ACS but many other  chronic and acute conditions  are known to elevate hsTnI results.  Refer to the "Links" section for chest pain algorithms and additional  guidance. Performed at [facility], [address] [place],  [place] [address]   POC SARS Coronavirus 2 Ag-ED - Nasal Swab (BD Veritor Kit)     Status: None   Collection Time: 10/17/19  7:04 PM  Result Value Ref Range   SARS Coronavirus 2 Ag NEGATIVE NEGATIVE    Comment: (NOTE) SARS-CoV-2 antigen NOT DETECTED.  Negative results are presumptive.  Negative results do not preclude SARS-CoV-2 infection and should not be used as the sole basis for treatment or other patient management decisions, including infection  control decisions, particularly in the presence of clinical signs and  symptoms consistent with COVID-19, or in those who have been in contact with the virus.  Negative results must be combined with clinical observations, patient history, and epidemiological information. The expected result is Negative. Fact Sheet for Patients: PodPark.tn Fact Sheet for Healthcare Providers: GiftContent.is This test is not yet approved or cleared by the [place] FDA and  has been authorized for detection and/or diagnosis of SARS-CoV-2 by FDA under an Emergency Use Authorization (EUA).  This EUA will remain in effect (meaning this test can be used) for the duration of  the COVID-19 de claration under Section 564(b)(1) of the Act, 21 U.S.C. section 360bbb-3(b)(1), unless the authorization is terminated or revoked sooner.   Troponin I (High Sensitivity)     Status: Abnormal   Collection Time: 10/17/19  8:32 PM  Result Value Ref Range   Troponin I (High Sensitivity) 69 (H) <18 ng/L    Comment: DELTA CHECK NOTED (NOTE) Elevated high sensitivity troponin I (hsTnI) values and significant  changes across serial measurements may suggest ACS but many other  chronic and acute conditions are known to elevate hsTnI results.  Refer  to the Links section for chest pain algorithms and additional  guidance. Performed at [facility], [address] [place], [place] [address]    Dg Chest Portable 1 View  Result Date: 10/17/2019 CLINICAL DATA:  Shortness of breath EXAM: PORTABLE CHEST 1 VIEW COMPARISON:  Radiograph 09/11/2019 FINDINGS: There is diffuse hazy opacity throughout both lungs with cephalized, indistinct pulmonary vascularity and likely trace left effusion tracking in the fissure. More patchy airspace opacities are present in the periphery of the lungs. Cardiomegaly is similar to slightly increased from comparison portable radiograph. Calcified aorta is similar to prior. No pneumothorax. No acute osseous or soft tissue abnormality. Degenerative changes are present in the imaged spine and shoulders. IMPRESSION: 1. Findings consistent with moderate to severe CHF. 2. Cardiomegaly similar to slightly increased from prior. 3. More patchy airspace opacities in the periphery of the lungs could represent alveolar edema or pneumonia. 4. Trace left pleural effusion. Aortic Atherosclerosis (ICD10-I70.0). Electronically Signed   By: Lovena Le M.D.   On: 10/17/2019 19:31    Pending Labs Unresulted Labs (From admission, onward)    Start     Ordered   10/17/19 2128  Respiratory Panel by RT PCR (Flu A&B, Covid) - Nasopharyngeal Swab  (Tier 2 Respiratory Panel by RT PCR (Flu A&B, Covid))  Once,   STAT    Question Answer Comment  Is this test for diagnosis or screening Screening   Symptomatic for COVID-19 as defined by CDC No   Hospitalized for COVID-19 No   Admitted to ICU for COVID-19 No   Previously tested for COVID-19 Yes   Resident in a congregate (group) care setting No   Employed in healthcare setting No      10/17/19 2127          Vitals/Pain Today's Vitals   10/17/19  1930 10/17/19 1933 10/17/19 2045 10/17/19 2131  BP: 113/78   108/71  Pulse: 95  99 98  Resp:   10   SpO2: 93% 93% 94% 94%  Weight:      Height:       PainSc:        Isolation Precautions Airborne and Contact precautions  Medications Medications  methylPREDNISolone sodium succinate (SOLU-MEDROL) 125 mg/2 mL injection 125 mg (125 mg Intravenous Given 10/17/19 1849)  ipratropium-albuterol (DUONEB) 0.5-2.5 (3) MG/3ML nebulizer solution 3 mL (3 mLs Nebulization Given 10/17/19 1933)  albuterol (PROVENTIL) (2.5 MG/3ML) 0.083% nebulizer solution 2.5 mg (2.5 mg Nebulization Given 10/17/19 1933)  magnesium sulfate IVPB 2 g 50 mL (0 g Intravenous Stopped 10/17/19 1943)  furosemide (LASIX) injection 40 mg (40 mg Intravenous Given 10/17/19 1930)  furosemide (LASIX) injection 40 mg (40 mg Intravenous Given 10/17/19 2027)    Mobility walks Low fall risk   Focused Assessments Cardiac Assessment Handoff:    Lab Results  Component Value Date   TROPONINI >65.00 ([place]) 01/27/2019   Lab Results  Component Value Date   DDIMER 0.29 06/24/2019   Does the Patient currently have chest pain? No     R Recommendations: See Admitting Provider Note  Report given to:   Additional Notes: Pt is high flow 02 at 15 lpm

## 2019-10-17 NOTE — ED Triage Notes (Signed)
Pt presents to ED with SOB started 1 hour ago.

## 2019-10-17 NOTE — ED Notes (Signed)
Took pt off bipap per dr Roderic Palau and placed on 4lpm of oxygen via nasal cannula

## 2019-10-18 ENCOUNTER — Other Ambulatory Visit: Payer: Self-pay

## 2019-10-18 ENCOUNTER — Encounter (HOSPITAL_COMMUNITY): Payer: Self-pay

## 2019-10-18 DIAGNOSIS — E785 Hyperlipidemia, unspecified: Secondary | ICD-10-CM

## 2019-10-18 DIAGNOSIS — N183 Chronic kidney disease, stage 3 unspecified: Secondary | ICD-10-CM

## 2019-10-18 DIAGNOSIS — Z955 Presence of coronary angioplasty implant and graft: Secondary | ICD-10-CM

## 2019-10-18 DIAGNOSIS — G4733 Obstructive sleep apnea (adult) (pediatric): Secondary | ICD-10-CM

## 2019-10-18 DIAGNOSIS — Z720 Tobacco use: Secondary | ICD-10-CM

## 2019-10-18 DIAGNOSIS — I5043 Acute on chronic combined systolic (congestive) and diastolic (congestive) heart failure: Secondary | ICD-10-CM

## 2019-10-18 DIAGNOSIS — I25118 Atherosclerotic heart disease of native coronary artery with other forms of angina pectoris: Secondary | ICD-10-CM

## 2019-10-18 DIAGNOSIS — I513 Intracardiac thrombosis, not elsewhere classified: Secondary | ICD-10-CM

## 2019-10-18 LAB — CBC
HCT: 41.3 % (ref 39.0–52.0)
Hemoglobin: 13.5 g/dL (ref 13.0–17.0)
MCH: 31.3 pg (ref 26.0–34.0)
MCHC: 32.7 g/dL (ref 30.0–36.0)
MCV: 95.6 fL (ref 80.0–100.0)
Platelets: 189 10*3/uL (ref 150–400)
RBC: 4.32 MIL/uL (ref 4.22–5.81)
RDW: 16.5 % — ABNORMAL HIGH (ref 11.5–15.5)
WBC: 7.3 10*3/uL (ref 4.0–10.5)
nRBC: 0 % (ref 0.0–0.2)

## 2019-10-18 LAB — BASIC METABOLIC PANEL
Anion gap: 10 (ref 5–15)
BUN: 19 mg/dL (ref 8–23)
CO2: 24 mmol/L (ref 22–32)
Calcium: 9.1 mg/dL (ref 8.9–10.3)
Chloride: 107 mmol/L (ref 98–111)
Creatinine, Ser: 1.43 mg/dL — ABNORMAL HIGH (ref 0.61–1.24)
GFR calc Af Amer: >60 mL/min (ref >60)
GFR calc non Af Amer: 52 mL/min — ABNORMAL LOW (ref >60)
Glucose, Bld: 146 mg/dL — ABNORMAL HIGH (ref 70–99)
Potassium: 3.2 mmol/L — ABNORMAL LOW (ref 3.5–5.1)
Sodium: 141 mmol/L (ref 135–145)

## 2019-10-18 LAB — TROPONIN I (HIGH SENSITIVITY)
Troponin I (High Sensitivity): 110 ng/L (ref <18)
Troponin I (High Sensitivity): 128 ng/L (ref <18)
Troponin I (High Sensitivity): 107 ng/L (ref <18)
Troponin I (High Sensitivity): 100 ng/L (ref <18)
Troponin I (High Sensitivity): 95 ng/L — ABNORMAL HIGH (ref <18)

## 2019-10-18 LAB — MRSA PCR SCREENING: MRSA by PCR: NEGATIVE

## 2019-10-18 MED ORDER — ASPIRIN 81 MG PO CHEW
81.0000 mg | CHEWABLE_TABLET | Freq: Every day | ORAL | Status: DC
  Administered 2019-10-18 – 2019-10-21 (×4): 81 mg via ORAL
  Filled 2019-10-18 (×4): qty 1

## 2019-10-18 MED ORDER — ALBUTEROL SULFATE (2.5 MG/3ML) 0.083% IN NEBU: 2.5000 mg | INHALATION_SOLUTION | Freq: Four times a day (QID) | RESPIRATORY_TRACT | Status: DC | PRN

## 2019-10-18 MED ORDER — POTASSIUM CHLORIDE CRYS ER 20 MEQ PO TBCR
40.0000 meq | EXTENDED_RELEASE_TABLET | Freq: Two times a day (BID) | ORAL | Status: DC
  Administered 2019-10-18 – 2019-10-21 (×7): 40 meq via ORAL
  Filled 2019-10-18 (×7): qty 2

## 2019-10-18 MED ORDER — CHLORHEXIDINE GLUCONATE CLOTH 2 % EX PADS
6.0000 | MEDICATED_PAD | Freq: Every day | CUTANEOUS | Status: DC
  Administered 2019-10-18 – 2019-10-21 (×4): 6 via TOPICAL

## 2019-10-18 MED ORDER — SACUBITRIL-VALSARTAN 24-26 MG PO TABS
1.0000 | ORAL_TABLET | Freq: Two times a day (BID) | ORAL | Status: DC
  Administered 2019-10-18: 1 via ORAL
  Filled 2019-10-18 (×3): qty 1

## 2019-10-18 NOTE — Progress Notes (Signed)
CRITICAL VALUE ALERT  Critical Value:  Troponin 110  Date & Time Notied:  10/18/2019   Provider Notified:Gadhia MD  Orders Received/Actions taken: Awaiting orders/response

## 2019-10-18 NOTE — Progress Notes (Signed)
CRITICAL VALUE ALERT  Critical Value:  Troponin 128  Date & Time Notied:  10/18/2019 [id number]           Provider Notified:Gadhia MD  Orders Received/Actions taken: Awaiting orders/response

## 2019-10-18 NOTE — Progress Notes (Signed)
PROGRESS NOTE    Tony Alvarez  [id number] DOB: 1956-08-15 DOA: 10/17/2019 PCP: [facility]   Brief Narrative:  Per HPI: Tony Alvarez is a 63 y.o. male with medical history significant of CAD, ischemic cardiomyopathy, COPD, OSA, hypertension, hyperlipidemia, CHF, chronic alcoholism with prior withdrawal, Barretts esophagus, hypothyroidism and depression who presented to the ER with acute shortness of breath.  Patient states he started having increased dyspnea on exertion tonight.  Did report some chest discomfort but no obvious pain.  Does have a history of chronic CHF and also reports slightly increased lower extremity swelling.  Has been taking his medications as prescribed.  No dietary noncompliance reported.  Noted to be significantly hypoxemic, tachypneic and tachycardic on presentation.  Initially was placed on BiPAP and given Lasix in the ER and had improvement in respiratory symptoms.  Subsequently placed on high flow nasal cannula. No fever, chills, cough, nausea, vomiting, abdominal pain.  12/8: Patient was admitted with acute on chronic CHF decompensation in the setting of LV dysfunction.  Prior LVEF 25-30%.  He appears to be diuresing well with IV Lasix, but remains on high flow nasal cannula oxygen with minimal shortness of breath.  He is maintaining soft blood pressure readings.  Troponins are becoming elevated and this is likely reflective of demand ischemia.  Assessment & Plan:   Active Problems:   Barrett's esophagus   Hyperlipidemia   Alcohol abuse   Ischemic cardiomyopathy   Acute on chronic systolic CHF (congestive heart failure) (HCC)   CAD (coronary artery disease)   Hypothyroid   COPD (chronic obstructive pulmonary disease) (HCC)   Depression   Sleep apnea   Hypertension   CHF exacerbation (HCC)   Acute hypoxemic respiratory failure secondary to acute on chronic systolic CHF exacerbation -Patient noted to have soft blood pressure readings  -2D echocardiogram 08/17/2019 shows LVEF 25-30% -Continue telemetry monitoring in stepdown unit with soft blood pressure readings noted, Entresto dose reduced by cardiology -Continue IV Lasix 40 mg twice daily -Continue spironolactone and Entresto -Monitor I's and O's as well as daily weights with -1.06 L fluid balance noted thus far. -Appreciate cardiology evaluation -Wean oxygen as tolerated  Elevated troponin likely secondary to demand ischemia related to above -Continue monitor serial troponins -EKG with no acute changes and baseline LBBB noted -Appreciate cardiology input  Mild hypokalemia-persistent -Continue to replete orally with aggressive diuresis -Recheck labs in a.m. with magnesium  CKD stage III (baseline creatinine 1.5-1.6) -Current creatinine of 1.43 -Continue to monitor closely with diuresis  Hypertension -Continue carvedilol  Hyperlipidemia -Continue atorvastatin  Depression -Continue Seroquel and bupropion  OSA -CPAP not given overnight until full coronavirus panel returns, therefore remains on high flow nasal cannula  Hypothyroidism -Levothyroxine  GERD -PPI  Tobacco abuse -Reduce tobacco use recently.   DVT prophylaxis: Heparin Code Status: Full Family Communication: None at bedside Disposition Plan: Continue diuresis for CHF exacerbation and wean oxygen as tolerated.  Appreciate cardiology evaluation.  Monitor soft blood pressure readings.  Entresto dose has been reduced.  No further Xarelto per cardiology.  He will need outpatient EP referral for ICD in the near future.   Consultants:   Cardiology  Procedures:   None  Antimicrobials:   None   Subjective: Patient seen and evaluated today with no new acute complaints or concerns. No acute concerns or events noted overnight.  He remains on high flow nasal cannula oxygen.  Denies any chest pain or discomfort.  States that his breathing has improved some  since admission.  Objective:  Vitals:   10/18/19 0300 10/18/19 0400 10/18/19 0500 10/18/19 0600  BP: (!) 92/53 (!) 109/56 94/61 (!) 83/56  Pulse: 87 77 87 85  Resp: (!) 21 18 (!) 23 (!) 22  Temp:  97.8 F (36.6 C)    TempSrc:  Oral    SpO2: 94% 96% 92% 93%  Weight:   104.5 kg   Height:        Intake/Output Summary (Last 24 hours) at 10/18/2019 0835 Last data filed at 10/18/2019 0500 Gross per 24 hour  Intake 486 ml  Output 1550 ml  Net -1064 ml   Filed Weights   10/17/19 1824 10/18/19 0003 10/18/19 0500  Weight: 112 kg 104.5 kg 104.5 kg    Examination:  General exam: Appears calm and comfortable  Respiratory system: Clear to auscultation. Respiratory effort normal.  Currently on high flow nasal cannula. Cardiovascular system: S1 & S2 heard, RRR. No JVD, murmurs, rubs, gallops or clicks. 1+ pedal edema bilaterally. Gastrointestinal system: Abdomen is nondistended, soft and nontender. No organomegaly or masses felt. Normal bowel sounds heard. Central nervous system: Alert and oriented. No focal neurological deficits. Extremities: Symmetric 5 x 5 power. Skin: No rashes, lesions or ulcers Psychiatry: Judgement and insight appear normal. Mood & affect appropriate.     Data Reviewed: I have personally reviewed following labs and imaging studies  CBC: Recent Labs  Lab 10/17/19 1847 10/18/19 0105  WBC 11.0* 7.3  NEUTROABS 6.7  --   HGB 14.6 13.5  HCT 45.7 41.3  MCV 96.6 95.6  PLT 255 [id number]   Basic Metabolic Panel: Recent Labs  Lab 10/17/19 1847 10/18/19 0105  NA 141 141  K 3.3* 3.2*  CL 108 107  CO2 17* 24  GLUCOSE 232* 146*  BUN 19 19  CREATININE 1.64* 1.43*  CALCIUM 9.4 9.1   GFR: Estimated Creatinine Clearance: 65.7 mL/min (A) (by C-G formula based on SCr of 1.43 mg/dL (H)). Liver Function Tests: Recent Labs  Lab 10/17/19 1847  AST 22  ALT 19  ALKPHOS 82  BILITOT 1.2  PROT 7.7  ALBUMIN 4.4   No results for input(s): LIPASE, AMYLASE in the last 168 hours. No results for  input(s): AMMONIA in the last 168 hours. Coagulation Profile: No results for input(s): INR, PROTIME in the last 168 hours. Cardiac Enzymes: No results for input(s): CKTOTAL, CKMB, CKMBINDEX, TROPONINI in the last 168 hours. BNP (last 3 results) Recent Labs    07/26/19 1102  PROBNP 992*   HbA1C: No results for input(s): HGBA1C in the last 72 hours. CBG: No results for input(s): GLUCAP in the last 168 hours. Lipid Profile: No results for input(s): CHOL, HDL, LDLCALC, TRIG, CHOLHDL, LDLDIRECT in the last 72 hours. Thyroid Function Tests: No results for input(s): TSH, T4TOTAL, FREET4, T3FREE, THYROIDAB in the last 72 hours. Anemia Panel: No results for input(s): VITAMINB12, FOLATE, FERRITIN, TIBC, IRON, RETICCTPCT in the last 72 hours. Sepsis Labs: No results for input(s): PROCALCITON, LATICACIDVEN in the last 168 hours.  Recent Results (from the past 240 hour(s))  Respiratory Panel by RT PCR (Flu A&B, Covid) - Nasopharyngeal Swab     Status: None   Collection Time: 10/17/19  9:28 PM   Specimen: Nasopharyngeal Swab  Result Value Ref Range Status   SARS Coronavirus 2 by RT PCR NEGATIVE NEGATIVE Final    Comment: (NOTE) SARS-CoV-2 target nucleic acids are NOT DETECTED. The SARS-CoV-2 RNA is generally detectable in upper respiratoy specimens during the acute phase of  infection. The lowest concentration of SARS-CoV-2 viral copies this assay can detect is 131 copies/mL. A negative result does not preclude SARS-Cov-2 infection and should not be used as the sole basis for treatment or other patient management decisions. A negative result may occur with  improper specimen collection/handling, submission of specimen other than nasopharyngeal swab, presence of viral mutation(s) within the areas targeted by this assay, and inadequate number of viral copies (<131 copies/mL). A negative result must be combined with clinical observations, patient history, and epidemiological information. The  expected result is Negative. Fact Sheet for Patients:  PinkCheek.be Fact Sheet for Healthcare Providers:  GravelBags.it This test is not yet ap proved or cleared by the [place] FDA and  has been authorized for detection and/or diagnosis of SARS-CoV-2 by FDA under an Emergency Use Authorization (EUA). This EUA will remain  in effect (meaning this test can be used) for the duration of the COVID-19 declaration under Section 564(b)(1) of the Act, 21 U.S.C. section 360bbb-3(b)(1), unless the authorization is terminated or revoked sooner.    Influenza A by PCR NEGATIVE NEGATIVE Final   Influenza B by PCR NEGATIVE NEGATIVE Final    Comment: (NOTE) The Xpert Xpress SARS-CoV-2/FLU/RSV assay is intended as an aid in  the diagnosis of influenza from Nasopharyngeal swab specimens and  should not be used as a sole basis for treatment. Nasal washings and  aspirates are unacceptable for Xpert Xpress SARS-CoV-2/FLU/RSV  testing. Fact Sheet for Patients: PinkCheek.be Fact Sheet for Healthcare Providers: GravelBags.it This test is not yet approved or cleared by the [place] FDA and  has been authorized for detection and/or diagnosis of SARS-CoV-2 by  FDA under an Emergency Use Authorization (EUA). This EUA will remain  in effect (meaning this test can be used) for the duration of the  Covid-19 declaration under Section 564(b)(1) of the Act, 21  U.S.C. section 360bbb-3(b)(1), unless the authorization is  terminated or revoked. Performed at [facility], [address], [place], [place] [address]   MRSA PCR Screening     Status: None   Collection Time: 10/17/19 11:56 PM   Specimen: Nasal Mucosa; Nasopharyngeal  Result Value Ref Range Status   MRSA by PCR NEGATIVE NEGATIVE Final    Comment:        The GeneXpert MRSA Assay (FDA approved for NASAL specimens only), is one  component of a comprehensive MRSA colonization surveillance program. It is not intended to diagnose MRSA infection nor to guide or monitor treatment for MRSA infections. Performed at [facility], [address] [place], [place] [address]          Radiology Studies: Dg Chest Portable 1 View  Result Date: 10/17/2019 CLINICAL DATA:  Shortness of breath EXAM: PORTABLE CHEST 1 VIEW COMPARISON:  Radiograph 09/11/2019 FINDINGS: There is diffuse hazy opacity throughout both lungs with cephalized, indistinct pulmonary vascularity and likely trace left effusion tracking in the fissure. More patchy airspace opacities are present in the periphery of the lungs. Cardiomegaly is similar to slightly increased from comparison portable radiograph. Calcified aorta is similar to prior. No pneumothorax. No acute osseous or soft tissue abnormality. Degenerative changes are present in the imaged spine and shoulders. IMPRESSION: 1. Findings consistent with moderate to severe CHF. 2. Cardiomegaly similar to slightly increased from prior. 3. More patchy airspace opacities in the periphery of the lungs could represent alveolar edema or pneumonia. 4. Trace left pleural effusion. Aortic Atherosclerosis (ICD10-I70.0). Electronically Signed   By: Lovena Le M.D.   On:  10/17/2019 19:31        Scheduled Meds: . ARIPiprazole  15 mg Oral QHS  . atorvastatin  80 mg Oral q1800  . buPROPion  300 mg Oral Q breakfast   And  . buPROPion  150 mg Oral QPC lunch  . carvedilol  3.125 mg Oral BID WC  . chlorhexidine  15 mL Mouth Rinse BID  . Chlorhexidine Gluconate Cloth  6 each Topical Daily  . clopidogrel  75 mg Oral Daily  . folic acid  1 mg Oral Daily  . furosemide  40 mg Intravenous BID  . guaiFENesin  1,200 mg Oral BID  . levothyroxine  125 mcg Oral Q0600  . mouth rinse  15 mL Mouth Rinse q12n4p  . multivitamin with minerals  1 tablet Oral q morning - 10a  . pantoprazole  40 mg Oral Daily  . potassium chloride   40 mEq Oral BID  . QUEtiapine  100 mg Oral QHS  . rivaroxaban  20 mg Oral Daily  . sacubitril-valsartan  1 tablet Oral BID  . sodium chloride flush  3 mL Intravenous Q12H  . spironolactone  12.5 mg Oral Daily  . thiamine  100 mg Oral Daily   Continuous Infusions: . sodium chloride       LOS: 1 day    Time spent: 30 minutes    Caralynn Gelber Darleen Crocker, DO [facility] Pager [phone]  If 7PM-7AM, please contact night-coverage [email or web address] Password [email or web address] 10/18/2019, 8:35 AM

## 2019-10-18 NOTE — TOC Initial Note (Signed)
Transition of Care [facility]) - Initial/Assessment Note    Patient Details  Name: Tony Alvarez MRN: [id number] Date of Birth: 02/18/1956  Transition [facility]) CM/SW Contact:    Tony Flood, LCSW Phone Number: 10/18/2019, 2:26 PM  Clinical Narrative:                  Pt admitted from home. He is high risk for readmission. Met with pt today to assess. Pt states that he lives alone in his [place] home. Pt states that he does not have any friends or family that live around him. He states that he has a friend that lives two hours away and everyone else is at least ten hours away. Pt is retired. He uses CPAP as his only DME. Per pt, he is independent in ADLs at home. He drives and states that he does not have any difficulty getting to appointments or obtaining medications. Discussed CHF management with pt who states he has a scale and weighs daily. When asked about following CHF diet, pt stated, "there's a CHF diet?" Offered consult from dietician for further information and pt agreeable. Notified RD, Tony Alvarez, who will follow up with pt today.  At this time, pt is not anticipating that he will have any new TOC needs at dc. TOC will be available if needs arise.  Expected Discharge Plan: Home/Self Care Barriers to Discharge: Continued Medical Work up   Patient Goals and CMS Choice        Expected Discharge Plan and Services Expected Discharge Plan: Home/Self Care In-house Referral: Clinical Social Work     Living arrangements for the past 2 months: Single Family Home                                      Prior Living Arrangements/Services Living arrangements for the past 2 months: Single Family Home Lives with:: Self Patient language and need for interpreter reviewed:: Yes Do you feel safe going back to the place where you live?: Yes      Need for Family Participation in Patient Care: No (Comment) Care giver support system in place?: No (comment) Current home services:  DME Criminal Activity/Legal Involvement Pertinent to Current Situation/Hospitalization: No - Comment as needed  Activities of Daily Living Home Assistive Devices/Equipment: Cane (specify quad or straight), CPAP, Blood pressure cuff, Eyeglasses ADL Screening (condition at time of admission) Patient's cognitive ability adequate to safely complete daily activities?: Yes Is the patient deaf or have difficulty hearing?: No Does the patient have difficulty seeing, even when wearing glasses/contacts?: No Does the patient have difficulty concentrating, remembering, or making decisions?: No Patient able to express need for assistance with ADLs?: Yes Does the patient have difficulty dressing or bathing?: No Independently performs ADLs?: Yes (appropriate for developmental age) Does the patient have difficulty walking or climbing stairs?: Yes Weakness of Legs: Both Weakness of Arms/Hands: None  Permission Sought/Granted                  Emotional Assessment Appearance:: Appears stated age Attitude/Demeanor/Rapport: Engaged Affect (typically observed): Flat Orientation: : Oriented to Self, Oriented to Place, Oriented to  Time, Oriented to Situation Alcohol / Substance Use: Not Applicable Psych Involvement: No (comment)  Admission diagnosis:  Hypoxia [T65.46] Systolic congestive heart failure, unspecified HF chronicity (HCC) [I50.20] CHF exacerbation ([place]) [I50.9] Patient Active Problem List   Diagnosis Date Noted  . CHF exacerbation ([place]) 10/17/2019  .  Acute and chronic respiratory failure (acute-on-chronic) ([place]) 09/11/2019  . Sleep apnea   . Hypertension   . AKI (acute kidney injury) ([place])   . CAD (coronary artery disease) 06/24/2019  . Hypothyroid   . COPD (chronic obstructive pulmonary disease) ([place])   . Depression   . Hyperlipidemia 01/30/2019  . LV (left ventricular) mural thrombus with acute MI ([place]) 01/30/2019  . Alcohol abuse 01/30/2019  . Alcohol withdrawal delirium  ([place]) 01/30/2019  . Elevated liver enzymes 01/30/2019  . Ischemic cardiomyopathy 01/30/2019  . Acute on chronic systolic CHF (congestive heart failure) ([place]) 01/30/2019  . History of colonic polyps 04/21/2018  . Barrett's esophagus 04/21/2018  . Barrett's esophagus without dysplasia 04/21/2018  . Rectal bleeding 04/21/2018   PCP:  A, [place]:   [facility] [place] [address] [place] [id number] [place] [address] [place] [address] Phone: [phone] Fax: [phone]     Social Determinants of Health (SDOH) Interventions    Readmission Risk Interventions Readmission Risk Prevention Plan 10/18/2019  Transportation Screening Complete  Social Work Consult for [place] Planning/Counseling [place] Not Applicable  Medication Review Press photographer) Complete  Some recent data might be hidden

## 2019-10-18 NOTE — Progress Notes (Signed)
Nutrition Education Note  RD consulted for nutrition education regarding onset CHF.  RD provided "Low Sodium Nutrition Therapy" handout. Reviewed patient's dietary recall. There were a number of high sodium foods he commonly consumes he was unaware of sodium content.  Encouraged him to read labels consistently, especially of foods he frequently eats. He limits eating out and likes to cook. Consumes chicken mainly but we also discussed lean beef occasionally as part of a healthy diet.   Provided examples on ways to decrease sodium intake in diet. Discouraged intake of processed foods and use of salt shaker. He likes [employer]. Encouraged fresh fruits and vegetables as well as whole grain sources of carbohydrates to maximize fiber intake.   RD discussed why it is important for patient to adhere to diet recommendations, and emphasized the role of fluids, foods to avoid, and importance of weighing self daily. Teach back method used.  Expect good compliance. Patient was engaged during education. He already weighs himself daily and uses a salt substitute.   Body mass index is 33.06 kg/m. Pt meets criteria for obesity based on current BMI.  Labs and medications reviewed.  BMP Latest Ref Rng & Units 10/18/2019 10/17/2019 09/13/2019  Glucose 70 - 99 mg/dL 146(H) 232(H) 166(H)  BUN 8 - 23 mg/dL 19 19 24(H)  Creatinine 0.61 - 1.24 mg/dL 1.43(H) 1.64(H) 1.54(H)  BUN/Creat Ratio 10 - 24 - - -  Sodium 135 - 145 mmol/L 141 141 138  Potassium 3.5 - 5.1 mmol/L 3.2(L) 3.3(L) 4.3  Chloride 98 - 111 mmol/L 107 108 105  CO2 22 - 32 mmol/L 24 17(L) 23  Calcium 8.9 - 10.3 mg/dL 9.1 9.4 9.6    Plan: No further nutrition interventions warranted at this time. RD contact information provided. If additional nutrition issues arise, please re-consult RD.   Colman Cater MS,RD,CSG,LDN Office: [phone] Pager: [phone]

## 2019-10-18 NOTE — Telephone Encounter (Signed)
Patient currently admitted for HF excerebration and hypoxia.

## 2019-10-18 NOTE — Consult Note (Addendum)
Cardiology Consult    Patient ID: Tony Alvarez; [id number]; January 08, 1956   Admit date: 10/17/2019 Date of Consult: 10/18/2019  Primary Care Provider: [facility] Primary Cardiologist: Shelva Majestic, MD   Patient Profile    Tony Alvarez is a 63 y.o. male with past medical history of CAD (s/p STEMI in 01/2019 with DES to proximal LAD), chronic combined systolic and diastolic CHF/ICM (EF [id number] by echo in 01/2019, at 25% in 05/2019 and 25-30% by echo in 08/2019), HTN, HLD, Stage 3 CKD, tobacco use and OSA who is being seen today for the evaluation of CHF at the request of Dr. Manuella Ghazi.   History of Present Illness    Tony Alvarez was last examined by Doreene Adas, PA-C on 10/10/2019 following a recent admission earlier that month for acute hypoxic respiratory failure in the setting of acute CHF exacerbation, COPD exacerbation and PNA. He had been taking Lasix 20mg  BID and weight had declined 10 lbs below his baseline (at 242lbs during his visit), therefore Lasix was reduced to 20mg  once daily. He was concerned about the cost of his medications and mentioned possibly discontinuing all of them and this was advised against. Given SBP in the 90's, he was continued on Coreg 3.125mg  BID, Spironolactone 12.5mg  BID and Entresto 49-51mg  BID along with Plavix and Xarelto without further titration. He did follow-up with Pharmacy the following week and Spironolactone was reduced to 12.5mg  once daily given soft BP with consideration of reducing Entresto if BP remained soft.   He presented to [facility] ED on 10/17/2019 for evaluation of worsening dyspnea which had started earlier that night. He reports being in his usual state of health until yesterday when he developed worsening dyspnea which occurred at rest or with activity. He denies specific orthopnea or PND. No chest pain or palpitations. He says that he has occasional swelling in his feet but had not noticed significant edema. Weight had declined  into the 230's on his home scales but was back up to 248-249 lbs on his home scales this week. He did not call the office about his weight gain until yesterday but says his weight had started to increase by 1-2 lbs daily since Lasix was reduced less than 3 weeks ago. He has reduced his tobacco use and is down to 1-5 cigarettes per day.   He initially required BiPAP and was transitioned to HFNC while in the ED. Initial labs show WBC 11.0, Hgb 14.6, platelets 255, Na+ 141, K+ 3.3 and creatinine 1.64 (at 1.5 in 09/2019, previously 1.1 - 1.2 earlier this year), AST 22 and ALT 19. BNP 1622. Initial HS Troponin 49 with repeat values of 69, 110 and 128. COVID negative. Influenza negative. CXR consistent with CHF. EKG showed computer generated read of atrial fibrillation but appears more consistent with NSR, HR 98 with atrial enlargement and IVCD.   He has been started on IV Lasix 40mg  BID with a recorded output of -1.1L thus far. Variable weights listed as 247 lbs on admission but 230 lbs today.   Past Medical History:  Diagnosis Date   Barrett's esophagus 04/21/2018   CAD (coronary artery disease)    a. s/p STEMI in 01/2019 with DES to proximal LAD   CHF (congestive heart failure) ([place])    a. EF 35-40% by echo in 01/2019, at 25-30% by repeat echo in 05/2019 and 08/2019   Depression    High cholesterol    Hypertension    Hypothyroid  Sleep apnea    wears CPAP at home    Past Surgical History:  Procedure Laterality Date   BIOPSY  05/24/2018   Procedure: BIOPSY;  Surgeon: Rogene Houston, MD;  [facility];  Service: Endoscopy;;  gastric   COLONOSCOPY     COLONOSCOPY N/A 05/24/2018   Procedure: COLONOSCOPY;  Surgeon: Rogene Houston, MD;  [facility];  Service: Endoscopy;  Laterality: N/A;  8:55   COLONOSCOPY WITH ESOPHAGOGASTRODUODENOSCOPY (EGD)     CORONARY STENT INTERVENTION N/A 01/26/2019   Procedure: CORONARY STENT INTERVENTION;  Surgeon: Troy Sine,  MD;  Location: [place] [facility];  Service: Cardiovascular;  Laterality: N/A;   CORONARY/GRAFT ACUTE MI REVASCULARIZATION N/A 01/26/2019   Procedure: Coronary/Graft Acute MI Revascularization;  Surgeon: Troy Sine, MD;  Location: [place] [facility];  Service: Cardiovascular;  Laterality: N/A;   ESOPHAGOGASTRODUODENOSCOPY N/A 05/24/2018   Procedure: ESOPHAGOGASTRODUODENOSCOPY (EGD);  Surgeon: Rogene Houston, MD;  [facility];  Service: Endoscopy;  Laterality: N/A;   LEFT HEART CATH AND CORONARY ANGIOGRAPHY N/A 01/26/2019   Procedure: LEFT HEART CATH AND CORONARY ANGIOGRAPHY;  Surgeon: Troy Sine, MD;  Location: [place] [facility];  Service: Cardiovascular;  Laterality: N/A;   MEDIAL PARTIAL KNEE REPLACEMENT     2015   POLYPECTOMY  05/24/2018   Procedure: POLYPECTOMY;  Surgeon: Rogene Houston, MD;  [facility];  Service: Endoscopy;;  colon   rt hand injury     shoulder surgery for bursitis     TONSILLECTOMY       Home Medications:  Prior to Admission medications   Medication Sig Start Date End Date Taking? Authorizing Provider  acetaminophen (TYLENOL) 325 MG tablet Take 2 tablets (650 mg total) by mouth every 6 (six) hours as needed for mild pain, fever or headache (or Fever >/= 101). 09/13/19  Yes Emokpae, Courage, MD  albuterol (VENTOLIN HFA) 108 (90 Base) MCG/ACT inhaler Inhale 2 puffs into the lungs every 6 (six) hours as needed for wheezing or shortness of breath. 09/13/19  Yes Emokpae, Courage, MD  ARIPiprazole (ABILIFY) 15 MG tablet Take 15 mg by mouth at bedtime. 09/01/19  Yes [provider]  buPROPion (WELLBUTRIN SR) 150 MG 12 hr tablet Take 150 mg by mouth 3 (three) times daily. Take two tablets (300 mg) by mouth every morning and one tablet (150 mg) with lunch 03/27/18  Yes [provider]  carvedilol (COREG) 3.125 MG tablet Take 1 tablet (3.125 mg total) by mouth 2 (two) times daily with a meal. 01/30/19  Yes Almyra Deforest, PA    clopidogrel (PLAVIX) 75 MG tablet Take 1 tablet (75 mg total) by mouth daily. 01/31/19  Yes Almyra Deforest, PA  doxycycline (VIBRA-TABS) 100 MG tablet  09/20/19  Yes [provider]  Eszopiclone 3 MG TABS Take 3 mg by mouth at bedtime. 09/01/19  Yes [provider]  folic acid (FOLVITE) 1 MG tablet Take 1 tablet (1 mg total) by mouth daily. 09/14/19  Yes Emokpae, Courage, MD  furosemide (LASIX) 20 MG tablet Take 1 tablet (20 mg total) by mouth daily. 10/10/19  Yes Duke, Tami Lin, PA  guaiFENesin (MUCINEX) 600 MG 12 hr tablet Take 2 tablets (1,200 mg total) by mouth 2 (two) times daily. 09/13/19  Yes Emokpae, Courage, MD  levothyroxine (SYNTHROID) 125 MCG tablet Take 125 mcg by mouth as needed.   Yes [provider]  Multiple Vitamin (MULTIVITAMIN WITH MINERALS) TABS tablet Take 1  tablet by mouth every morning.   Yes [provider]  nitroGLYCERIN (NITROSTAT) 0.4 MG SL tablet Place 1 tablet (0.4 mg total) under the tongue every 5 (five) minutes x 3 doses as needed for chest pain. 01/30/19  Yes Almyra Deforest, PA  pantoprazole (PROTONIX) 40 MG tablet Take 1 tablet (40 mg total) by mouth daily. 09/13/19 09/12/20 Yes Emokpae, Courage, MD  QUEtiapine (SEROQUEL) 100 MG tablet TAKE 1 2 (ONE HALF) TABLET BY MOUTH ONCE DAILY AT BEDTIME. MAY RAISE TO 1 TALBET AT BEDTIME 10/04/19  Yes [provider]  rivaroxaban (XARELTO) 20 MG TABS tablet Take 1 tablet (20 mg total) by mouth daily. 01/31/19  Yes Meng, Isaac Laud, PA  sacubitril-valsartan (ENTRESTO) 49-51 MG Take 1 tablet by mouth 2 (two) times daily. 07/08/19  Yes Troy Sine, MD  spironolactone (ALDACTONE) 25 MG tablet Take 0.5 tablets (12.5 mg total) by mouth daily. 08/29/19  Yes Troy Sine, MD  thiamine 100 MG tablet Take 1 tablet (100 mg total) by mouth daily. 09/14/19  Yes Emokpae, Courage, MD  VIIBRYD 20 MG TABS TAKE 1 2 (ONE HALF) TABLET BY MOUTH ONCE DAILY IN THE EVENING AFTER FOOD FOR 7 DAYS THEN 1 TABLET ONCE DAILY  IN THE EVENING THEREAFTER 09/05/19  Yes [provider]  atorvastatin (LIPITOR) 80 MG tablet Take 1 tablet (80 mg total) by mouth daily at 6 PM. 01/30/19   Almyra Deforest, PA  cefdinir (OMNICEF) 300 MG capsule  09/20/19   [provider]  predniSONE (DELTASONE) 20 MG tablet  09/20/19   [provider]    Inpatient Medications: Scheduled Meds:  ARIPiprazole  15 mg Oral QHS   atorvastatin  80 mg Oral q1800   buPROPion  300 mg Oral Q breakfast   And   buPROPion  150 mg Oral QPC lunch   carvedilol  3.125 mg Oral BID WC   chlorhexidine  15 mL Mouth Rinse BID   Chlorhexidine Gluconate Cloth  6 each Topical Daily   clopidogrel  75 mg Oral Daily   folic acid  1 mg Oral Daily   furosemide  40 mg Intravenous BID   guaiFENesin  1,200 mg Oral BID   levothyroxine  125 mcg Oral Q0600   mouth rinse  15 mL Mouth Rinse q12n4p   multivitamin with minerals  1 tablet Oral q morning - 10a   pantoprazole  40 mg Oral Daily   potassium chloride  40 mEq Oral BID   QUEtiapine  100 mg Oral QHS   rivaroxaban  20 mg Oral Daily   sacubitril-valsartan  1 tablet Oral BID   sodium chloride flush  3 mL Intravenous Q12H   spironolactone  12.5 mg Oral Daily   thiamine  100 mg Oral Daily   Continuous Infusions:  sodium chloride     PRN Meds: sodium chloride, acetaminophen **OR** acetaminophen, albuterol, bisacodyl, nitroGLYCERIN, ondansetron **OR** ondansetron (ZOFRAN) IV, polyethylene glycol, sodium chloride flush, traMADol  Allergies:   No Known Allergies  Social History:   Social History   Socioeconomic History   Marital status: Widowed    Spouse name: Not on file   Number of children: Not on file   Years of education: Not on file   Highest education level: Not on file  Occupational History   Not on file  Social Needs   Financial resource strain: Not on file   Food insecurity    Worry: Not on file    Inability: Not on file   Transportation  needs    Medical: Not on file    Non-medical: Not on file  Tobacco Use   Smoking status: Current Every Day Smoker    Packs/day: 0.25    Years: 40.00    Pack years: 10.00    Types: Cigarettes   Smokeless tobacco: Never Used   Tobacco comment: 7 cigarettes per day 05/25/19  Substance and Sexual Activity   Alcohol use: Yes    Frequency: Never    Comment: very rare   Drug use: Never   Sexual activity: Not on file  Lifestyle   Physical activity    Days per week: Not on file    Minutes per session: Not on file   Stress: Not on file  Relationships   Social connections    Talks on phone: Not on file    Gets together: Not on file    Attends religious service: Not on file    Active member of club or organization: Not on file    Attends meetings of clubs or organizations: Not on file    Relationship status: Not on file   Intimate partner violence    Fear of current or ex partner: Not on file    Emotionally abused: Not on file    Physically abused: Not on file    Forced sexual activity: Not on file  Other Topics Concern   Not on file  Social History Narrative   Not on file     Family History:    Family History  Problem Relation Age of Onset   Colon cancer Neg Hx       Review of Systems    General:  No chills, fever, night sweats or weight changes.  Cardiovascular:  No chest pain, edema, orthopnea, palpitations, paroxysmal nocturnal dyspnea. Positive for dyspnea on exertion.  Dermatological: No rash, lesions/masses Respiratory: No cough, dyspnea Urologic: No hematuria, dysuria Abdominal:   No nausea, vomiting, diarrhea, bright red blood per rectum, melena, or hematemesis Neurologic:  No visual changes, wkns, changes in mental status. All other systems reviewed and are otherwise negative except as noted above.  Physical Exam/Data    Vitals:   10/18/19 0500 10/18/19 0600 10/18/19 0800 10/18/19 0845  BP: 94/61 (!) 83/56 (!) 86/57   Pulse: 87 85 84   Resp:  (!) 23 (!) 22 (!) 22   Temp:    97.7 F (36.5 C)  TempSrc:    Oral  SpO2: 92% 93% 96%   Weight: 104.5 kg     Height:        Intake/Output Summary (Last 24 hours) at 10/18/2019 1004 Last data filed at 10/18/2019 0500 Gross per 24 hour  Intake 486 ml  Output 1550 ml  Net -1064 ml   Filed Weights   10/17/19 1824 10/18/19 0003 10/18/19 0500  Weight: 112 kg 104.5 kg 104.5 kg   Body mass index is 33.06 kg/m.   General: Pleasant male appearing in NAD Psych: Normal affect. Neuro: Alert and oriented X 3. Moves all extremities spontaneously. HEENT: Normal  Neck: Supple without bruits or JVD. Lungs:  Resp regular and unlabored, rales along bases bilaterally. Heart: RRR no s3, s4, or murmurs. Abdomen: Soft, non-tender, non-distended, BS + x 4.  Extremities: No clubbing, cyanosis. 1+ pitting edema bilaterally. DP/PT/Radials 2+ and equal bilaterally.   EKG:  The EKG was personally reviewed and demonstrates: Computer generated read of atrial fibrillation but appears more consistent with NSR, HR 98 with atrial enlargement and IVCD.   Labs/Studies  Relevant CV Studies:  Cardiac Catheterization: 01/2019  Prox RCA lesion is 20% stenosed.  Prox Cx lesion is 20% stenosed.  Prox LAD-1 lesion is 100% stenosed.  Post intervention, there is a 0% residual stenosis.  Prox LAD-2 lesion is 20% stenosed.  Mid LAD lesion is 40% stenosed.  LV end diastolic pressure is mildly elevated.  There is moderate left ventricular systolic dysfunction.  A stent was successfully placed.   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  Almost a common ostium left main with immediate trifurcation into the LAD, small ramus intermediate vessel and moderate size circumflex vessel.  The ramus vessel is normal.  The circumflex vessel has 20% proximal narrowing and gave  rise to 2 marginal branches.  The RCA is a dominant vessel with moderate luminal irregularity with 20% narrowing proximally 40% mid stenosis and 30% narrowing proximal to the acute margin.  Probable moderate acute LV dysfunction with hypocontractility involving the anterolateral wall and apex.  LVEDP 22 mm.  Successful PCI to the totally occluded LAD with PTCA and ultimate insertion of a 3.0 x 22 mm Resolute Onyx stent postdilated to 3.26 mm with the 100% occlusion being reduced to 0% and TIMI 0 flow improving to TIMI-3 flow.  Once reperfusion was established, LAD had 20% mid LAD smooth narrowing and 40% distal smooth stenosis.  The LAD wrapped around the apex and supplied the distal third of the inferior wall.  RECOMMENDATION: DAPT for minimum of 1 year.  Optimal blood pressure control with ideal blood pressure less than 120/80.  High potency statin therapy with target LDL less than 70.  Medical therapy for concomitant CAD.  A 2D echo Doppler study will be ordered improved LV function assessment.  Echo: 01/2019 IMPRESSIONS    1. The left ventricle has moderately reduced systolic function, with an ejection fraction of 35-40%. There is mildly increased left ventricular wall thickness.  2. Severe akinesis of the left ventricular, mid-apical anteroseptal wall, anterior wall, apical segment and inferoapical wall.  3. Small, fixed thrombus on the anteroapical wall of the left ventricle.   Echocardiogram: 08/2019 IMPRESSIONS    1. Left ventricular ejection fraction, by visual estimation, is 25 to 30%. The left ventricle has severely decreased function. There is no left ventricular hypertrophy.  2. Left ventricular diastolic Doppler parameters are consistent with pseudonormalization pattern of LV diastolic filling.  3. There is akinesis of the mid- distal anterioseptal wall, apex and apical lateral regions.          There is spontaneous contrast in apical LV .     Definity contrast was  used. There is no evidence of apical thrombi.  4. Global right ventricle has normal systolic function.The right ventricular size is normal. No increase in right ventricular wall thickness.  5. Left atrial size was mildly dilated.  6. Right atrial size was normal.  7. The mitral valve is normal in structure. Moderate mitral valve regurgitation.  8. The tricuspid valve is normal in structure. Tricuspid valve regurgitation is trivial.  9. The aortic valve is normal in structure. Aortic valve regurgitation was not visualized by color flow Doppler. 10. The pulmonic valve was grossly normal. Pulmonic valve regurgitation is not visualized by color flow Doppler. 11. Aortic dilatation noted. 12. There is mild dilatation of the ascending aorta measuring 39 mm. 13. Normal pulmonary artery systolic pressure. 14. The atrial septum is grossly  normal.  Laboratory Data:  Chemistry Recent Labs  Lab 10/17/19 1847 10/18/19 0105  NA 141 141  K 3.3* 3.2*  CL 108 107  CO2 17* 24  GLUCOSE 232* 146*  BUN 19 19  CREATININE 1.64* 1.43*  CALCIUM 9.4 9.1  GFRNONAA 44* 52*  GFRAA 52* >60  ANIONGAP 16* 10    Recent Labs  Lab 10/17/19 1847  PROT 7.7  ALBUMIN 4.4  AST 22  ALT 19  ALKPHOS 82  BILITOT 1.2   Hematology Recent Labs  Lab 10/17/19 1847 10/18/19 0105  WBC 11.0* 7.3  RBC 4.73 4.32  HGB 14.6 13.5  HCT 45.7 41.3  MCV 96.6 95.6  MCH 30.9 31.3  MCHC 31.9 32.7  RDW 16.7* 16.5*  PLT 255 189   Cardiac EnzymesNo results for input(s): TROPONINI in the last 168 hours. No results for input(s): TROPIPOC in the last 168 hours.  BNP Recent Labs  Lab 10/17/19 1847  BNP 1,622.0*    DDimer No results for input(s): DDIMER in the last 168 hours.  Radiology/Studies:  Dg Chest Portable 1 View  Result Date: 10/17/2019 CLINICAL DATA:  Shortness of breath EXAM: PORTABLE CHEST 1 VIEW COMPARISON:  Radiograph 09/11/2019 FINDINGS: There is diffuse hazy opacity throughout both lungs with cephalized,  indistinct pulmonary vascularity and likely trace left effusion tracking in the fissure. More patchy airspace opacities are present in the periphery of the lungs. Cardiomegaly is similar to slightly increased from comparison portable radiograph. Calcified aorta is similar to prior. No pneumothorax. No acute osseous or soft tissue abnormality. Degenerative changes are present in the imaged spine and shoulders. IMPRESSION: 1. Findings consistent with moderate to severe CHF. 2. Cardiomegaly similar to slightly increased from prior. 3. More patchy airspace opacities in the periphery of the lungs could represent alveolar edema or pneumonia. 4. Trace left pleural effusion. Aortic Atherosclerosis (ICD10-I70.0). Electronically Signed   By: Lovena Le M.D.   On: 10/17/2019 19:31     Assessment & Plan    1. Acute on Chronic Combined Systolic and Diastolic CHF Exacerbation - He has a known reduced EF of 25 to 30% by most recent echocardiogram in 08/2019.  By his report, weight had increased by 10 to 15 pounds on his home scales over the past few weeks but he did not start to experience symptoms until yesterday. Denies any significant change in his dietary intake. - BNP was elevated to 1622 on admission and CXR was consistent with CHF.  He does have at least 1+ pitting edema on examination. - Agree with continuing IV Lasix 40 mg twice daily. Continue to follow I's and O's along with daily weights. Will need to establish a new baseline weight given his variable readings. - He was continued on PTA Coreg 3.125mg  BID, Spironolactone 12.5mg  daily and Entresto 49-51mg  BID at the time of admission. Given SBP in the 90's will reduce Entresto to 24-26mg  BID as previously recommended by Pharmacy and add hold parameters. May ultimately need EP consult as an outpatient for consideration of ICD placement if EF remains reduced.   2. CAD/Elevated Troponin Values - he is s/p STEMI in 01/2019 with DES to proximal LAD. He denies  any recent chest pain. Troponin values have been flat at 49 with repeat values of 69, 110 and 128 this admission which is consistent with demand ischemia in the setting of an acute CHF exacerbation.  - continue Plavix, BB and statin therapy. Not on ASA given the need for anticoagulation.   3.  History of LV Thrombus - was started on Xarelto following admission in 01/2019. He denies any evidence of active bleeding and Hgb remains stable at 14.6.  4.HLD - LDL at 59 in 03/2019. Continue Atorvastatin 80mg  daily.   5. Stage 3 CKD - previous baseline of 1.1 - 1.2 but elevated to 1.5 last admission. At 1.64 yesterday. Continue to follow closely while receiving IV Lasix.   6. OSA - continued compliance with CPAP encouraged.   7. Tobacco Use - he was previously smoking 1 ppd prior to his MI and has reduced his use to 1-5 cigarettes per day. Congratulated on his reduction.    For questions or updates, please contact [place] Please consult [email or web address] for contact info under Cardiology/STEMI.  Signed, Erma Heritage, PA-C 10/18/2019, 10:04 AM Pager: [phone]   The patient was seen and examined, and I agree with the history, physical exam, assessment and plan as documented above, with modifications as noted below. I have also personally reviewed all relevant documentation, old records, labs, and both radiographic and cardiovascular studies. I have also independently interpreted old and new ECG's.  Briefly, this is a 63 year old male with coronary disease with STEMI in March 2020 and drug-eluting stent placement to the proximal LAD.  He has chronic combined heart failure with most recent LVEF of 25 to 30% in October 2020.  He was noted to have a small apical thrombus by echocardiogram in March 2020.  Using contrast enhancement, no apical thrombus was seen by echocardiograms both in July 2020 and October 2020.  He has remained on both Xarelto and Plavix.  Most recent cardiology  office visit was on October 10, 2019.  Because his weight had declined below his baseline Lasix was reduced from 20 mg twice daily to Lasix 20 mg daily.  Since that time he has been progressively gaining weight.  He presented to the ED yesterday with progressive shortness of breath.  He denies chest pain.  Weights have been variable.  Labs reviewed above as well as most recent cardiac catheterization and echocardiogram.  I agree that the automated interpretation of the ECG is incorrect as sinus rhythm is demonstrated and not atrial fibrillation.  He has been started on IV Lasix 40 mg twice daily with over 1.06 L output.  Weight is documented at 104.5 kg today.  Given that no thrombus was seen by echocardiograms in July and October, I will discontinue Xarelto and resume aspirin 81 mg daily.  He will remain on Plavix with dual antiplatelet therapy for minimum of 1 year from the time of drug-eluting stent placement.  He will need an outpatient EP referral for ICD placement.  Continue carvedilol, spironolactone and reduced dose of Entresto 24-26 mg twice daily given soft blood pressures.        Kate Sable, MD, [facility]  10/18/2019 11:04 AM

## 2019-10-19 DIAGNOSIS — I10 Essential (primary) hypertension: Secondary | ICD-10-CM

## 2019-10-19 LAB — BASIC METABOLIC PANEL
Anion gap: 10 (ref 5–15)
BUN: 28 mg/dL — ABNORMAL HIGH (ref 8–23)
CO2: 25 mmol/L (ref 22–32)
Calcium: 9.1 mg/dL (ref 8.9–10.3)
Chloride: 106 mmol/L (ref 98–111)
Creatinine, Ser: 1.41 mg/dL — ABNORMAL HIGH (ref 0.61–1.24)
GFR calc Af Amer: >60 mL/min (ref >60)
GFR calc non Af Amer: 53 mL/min — ABNORMAL LOW (ref >60)
Glucose, Bld: 114 mg/dL — ABNORMAL HIGH (ref 70–99)
Potassium: 3.4 mmol/L — ABNORMAL LOW (ref 3.5–5.1)
Sodium: 141 mmol/L (ref 135–145)

## 2019-10-19 LAB — CBC
HCT: 35.3 % — ABNORMAL LOW (ref 39.0–52.0)
Hemoglobin: 11.4 g/dL — ABNORMAL LOW (ref 13.0–17.0)
MCH: 30.9 pg (ref 26.0–34.0)
MCHC: 32.3 g/dL (ref 30.0–36.0)
MCV: 95.7 fL (ref 80.0–100.0)
Platelets: 179 10*3/uL (ref 150–400)
RBC: 3.69 MIL/uL — ABNORMAL LOW (ref 4.22–5.81)
RDW: 16.7 % — ABNORMAL HIGH (ref 11.5–15.5)
WBC: 10.1 10*3/uL (ref 4.0–10.5)
nRBC: 0.2 % (ref 0.0–0.2)

## 2019-10-19 LAB — MAGNESIUM: Magnesium: 2.1 mg/dL (ref 1.7–2.4)

## 2019-10-19 MED ORDER — FUROSEMIDE 10 MG/ML IJ SOLN
40.0000 mg | Freq: Two times a day (BID) | INTRAMUSCULAR | Status: DC
  Filled 2019-10-19 (×2): qty 4

## 2019-10-19 NOTE — Progress Notes (Signed)
Progress Note  Patient Name: Tony Alvarez Date of Encounter: 10/19/2019  Primary Cardiologist: Shelva Majestic, MD   Subjective   Shortness of breath has significantly improved. Denies chest pain.  Inpatient Medications    Scheduled Meds:  ARIPiprazole  15 mg Oral QHS   aspirin  81 mg Oral Daily   atorvastatin  80 mg Oral q1800   buPROPion  300 mg Oral Q breakfast   And   buPROPion  150 mg Oral QPC lunch   carvedilol  3.125 mg Oral BID WC   chlorhexidine  15 mL Mouth Rinse BID   Chlorhexidine Gluconate Cloth  6 each Topical Daily   clopidogrel  75 mg Oral Daily   folic acid  1 mg Oral Daily   furosemide  40 mg Intravenous BID   guaiFENesin  1,200 mg Oral BID   levothyroxine  125 mcg Oral Q0600   mouth rinse  15 mL Mouth Rinse q12n4p   multivitamin with minerals  1 tablet Oral q morning - 10a   pantoprazole  40 mg Oral Daily   potassium chloride  40 mEq Oral BID   QUEtiapine  100 mg Oral QHS   sacubitril-valsartan  1 tablet Oral BID   sodium chloride flush  3 mL Intravenous Q12H   spironolactone  12.5 mg Oral Daily   thiamine  100 mg Oral Daily   Continuous Infusions:  sodium chloride     PRN Meds: sodium chloride, acetaminophen **OR** acetaminophen, albuterol, bisacodyl, nitroGLYCERIN, ondansetron **OR** ondansetron (ZOFRAN) IV, polyethylene glycol, sodium chloride flush, traMADol   Vital Signs    Vitals:   10/19/19 0500 10/19/19 0600 10/19/19 0805 10/19/19 0850  BP: 90/71 (!) 87/66  95/69  Pulse: 79 78 78 87  Resp: 16 19 18    Temp:   98.1 F (36.7 C)   TempSrc:   Oral   SpO2: 95% 93% 96%   Weight: 104.9 kg     Height:        Intake/Output Summary (Last 24 hours) at 10/19/2019 1101 Last data filed at 10/18/2019 2300 Gross per 24 hour  Intake 650 ml  Output 1440 ml  Net -790 ml   Filed Weights   10/18/19 0003 10/18/19 0500 10/19/19 0500  Weight: 104.5 kg 104.5 kg 104.9 kg    Telemetry    Sinus rhythm with PVC's - Personally  Reviewed  ECG    No new tracings - Personally Reviewed  Physical Exam   GEN: No acute distress.   Neck: No JVD Cardiac: RRR, no murmurs, rubs, or gallops.  Respiratory: Bibasilar rales. GI: Soft, nontender, non-distended  MS: No edema; No deformity. Neuro:  Nonfocal  Psych: Normal affect   Labs    Chemistry Recent Labs  Lab 10/17/19 1847 10/18/19 0105 10/19/19 0409  NA 141 141 141  K 3.3* 3.2* 3.4*  CL 108 107 106  CO2 17* 24 25  GLUCOSE 232* 146* 114*  BUN 19 19 28*  CREATININE 1.64* 1.43* 1.41*  CALCIUM 9.4 9.1 9.1  PROT 7.7  --   --   ALBUMIN 4.4  --   --   AST 22  --   --   ALT 19  --   --   ALKPHOS 82  --   --   BILITOT 1.2  --   --   GFRNONAA 44* 52* 53*  GFRAA 52* >60 >60  ANIONGAP 16* 10 10     Hematology Recent Labs  Lab 10/17/19 1847 10/18/19 0105 10/19/19 0409  WBC 11.0* 7.3 10.1  RBC 4.73 4.32 3.69*  HGB 14.6 13.5 11.4*  HCT 45.7 41.3 35.3*  MCV 96.6 95.6 95.7  MCH 30.9 31.3 30.9  MCHC 31.9 32.7 32.3  RDW 16.7* 16.5* 16.7*  PLT 255 189 179    Cardiac EnzymesNo results for input(s): TROPONINI in the last 168 hours. No results for input(s): TROPIPOC in the last 168 hours.   BNP Recent Labs  Lab 10/17/19 1847  BNP 1,622.0*     DDimer No results for input(s): DDIMER in the last 168 hours.   Radiology    Dg Chest Portable 1 View  Result Date: 10/17/2019 CLINICAL DATA:  Shortness of breath EXAM: PORTABLE CHEST 1 VIEW COMPARISON:  Radiograph 09/11/2019 FINDINGS: There is diffuse hazy opacity throughout both lungs with cephalized, indistinct pulmonary vascularity and likely trace left effusion tracking in the fissure. More patchy airspace opacities are present in the periphery of the lungs. Cardiomegaly is similar to slightly increased from comparison portable radiograph. Calcified aorta is similar to prior. No pneumothorax. No acute osseous or soft tissue abnormality. Degenerative changes are present in the imaged spine and shoulders.  IMPRESSION: 1. Findings consistent with moderate to severe CHF. 2. Cardiomegaly similar to slightly increased from prior. 3. More patchy airspace opacities in the periphery of the lungs could represent alveolar edema or pneumonia. 4. Trace left pleural effusion. Aortic Atherosclerosis (ICD10-I70.0). Electronically Signed   By: Lovena Le M.D.   On: 10/17/2019 19:31    Cardiac Studies   Relevant CV Studies:  Cardiac Catheterization: 01/2019  Prox RCA lesion is 20% stenosed.  Prox Cx lesion is 20% stenosed.  Prox LAD-1 lesion is 100% stenosed.  Post intervention, there is a 0% residual stenosis.  Prox LAD-2 lesion is 20% stenosed.  Mid LAD lesion is 40% stenosed.  LV end diastolic pressure is mildly elevated.  There is moderate left ventricular systolic dysfunction.  A stent was successfully placed.  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  Almost a common ostium left main with immediate trifurcation into the LAD, small ramus intermediate vessel and moderate size circumflex vessel. The ramus vessel is normal. The circumflex vessel has 20% proximal narrowing and gave rise to 2 marginal branches. The RCA is a dominant vessel with moderate luminal irregularity with 20% narrowing proximally 40% mid stenosis and 30% narrowing proximal to the acute margin.  Probable moderate acute LV dysfunction with hypocontractility involving the anterolateral wall and apex. LVEDP 22 mm.  Successful PCI to the totally occluded LAD with PTCA and ultimate insertion of a 3.0 x 22 mm Resolute Onyx stent postdilated to 3.26 mm with the 100% occlusion being reduced to 0% and TIMI 0 flow improving to TIMI-3 flow. Once reperfusion was established, LAD had 20% mid LAD smooth narrowing and 40% distal smooth stenosis. The LAD wrapped around the apex and supplied the  distal third of the inferior wall.  RECOMMENDATION: DAPT for minimum of 1 year. Optimal blood pressure control with ideal blood pressure less than 120/80. High potency statin therapy with target LDL less than 70. Medical therapy for concomitant CAD. A 2D echo Doppler study will be ordered improved LV function assessment.  Echo: 01/2019 IMPRESSIONS   1. The left ventricle has moderately reduced systolic function, with an ejection fraction of 35-40%. There is mildly increased left ventricular wall thickness. 2. Severe akinesis of the left ventricular, mid-apical anteroseptal  wall, anterior wall, apical segment and inferoapical wall. 3. Small, fixed thrombus on the anteroapical wall of the left ventricle.   Echocardiogram: 08/2019 IMPRESSIONS   1. Left ventricular ejection fraction, by visual estimation, is 25 to 30%. The left ventricle has severely decreased function. There is no left ventricular hypertrophy. 2. Left ventricular diastolic Doppler parameters are consistent with pseudonormalization pattern of LV diastolic filling. 3. There is akinesis of the mid- distal anterioseptal wall, apex and apical lateral regions.  There is spontaneous contrast in apical LV . Definity contrast was used. There is no evidence of apical thrombi. 4. Global right ventricle has normal systolic function.The right ventricular size is normal. No increase in right ventricular wall thickness. 5. Left atrial size was mildly dilated. 6. Right atrial size was normal. 7. The mitral valve is normal in structure. Moderate mitral valve regurgitation. 8. The tricuspid valve is normal in structure. Tricuspid valve regurgitation is trivial. 9. The aortic valve is normal in structure. Aortic valve regurgitation was not visualized by color flow Doppler. 10. The pulmonic valve was grossly normal. Pulmonic valve regurgitation is not visualized by color flow Doppler. 11. Aortic dilatation  noted. 12. There is mild dilatation of the ascending aorta measuring 39 mm. 13. Normal pulmonary artery systolic pressure. 14. The atrial septum is grossly normal.  Patient Profile     63 y.o. male with past medical history of CAD (s/p STEMI in 01/2019 with DES to proximal LAD), chronic combined systolic and diastolic CHF/ICM (EF [id number] by echo in 01/2019, at 25% in 05/2019 and 25-30% by echo in 08/2019), HTN, HLD, Stage 3 CKD, tobacco use and OSA who is being seen today for the evaluation of CHF at the request of Dr. Manuella Ghazi.   Assessment & Plan   1. Acute on Chronic Combined Systolic and Diastolic CHF Exacerbation - 790 cc output in last 24 hours.  He has a known reduced EF of 25 to 30% by most recent echocardiogram in 08/2019.  By his report, weight had increased by 10 to 15 pounds on his home scales over the past few weeks but he did not start to experience symptoms until 10/17/2019. Denies any significant change in his dietary intake. - BNP was elevated to 1622 on admission and CXR was consistent with CHF.   - Continue IV Lasix 40 mg twice daily. May be able to switch to oral Lasix on 12/10. -Continue to follow I's and O's along with daily weights. Will need to establish a new baseline weight given his variable readings. - Continue Coreg 3.125mg  BID, Spironolactone 12.5mg  daily and Entresto 24-26mg  BID (reduced from prior dose of 49-51 mg twice daily due to soft blood pressures).  Will need EP consult as an outpatient for consideration of ICD placement.   2. CAD/Elevated Troponin Values - he is s/p STEMI in 01/2019 with DES to proximal LAD. He denies any recent chest pain. Troponin values have been flat at 49 with repeat values of 69, 110 and 128 this admission which is consistent with demand ischemia in the setting of an acute CHF exacerbation.  - continue ASA, Plavix, BB and statin therapy.   3. History of LV Thrombus - was started on Xarelto following admission in 01/2019.  He has no  active bleeding.  No apical thrombus seen with echocardiograms using contrast enhancement both in July 2020 in October 2020.  I discontinued Xarelto.  4.HLD - LDL at 59 in 03/2019. Continue Atorvastatin 80mg  daily.   5. Stage 3 CKD -  previous baseline of 1.1 - 1.2 but elevated to 1.5 last admission. At 1.41 yesterday. Continue to follow closely while receiving IV Lasix.   6. OSA - continued compliance with CPAP encouraged.   7. Tobacco Use - he was previously smoking 1 ppd prior to his MI and has reduced his use to 1-5 cigarettes per day.     For questions or updates, please contact [place] Please consult [email or web address] for contact info under Cardiology/STEMI.      Signed, Kate Sable, MD  10/19/2019, 11:01 AM

## 2019-10-19 NOTE — Progress Notes (Addendum)
PROGRESS NOTE    Tony Alvarez  [id number] DOB: Jul 20, 1956 DOA: 10/17/2019 PCP: [facility]   Brief Narrative:  Per HPI: Tony Alvarez is a 63 y.o. male with medical history significant of CAD, ischemic cardiomyopathy, COPD, OSA, hypertension, hyperlipidemia, CHF, chronic alcoholism with prior withdrawal, Barretts esophagus, hypothyroidism and depression who presented to the ER with acute shortness of breath.  Patient states he started having increased dyspnea on exertion tonight.  Did report some chest discomfort but no obvious pain.  Does have a history of chronic CHF and also reports slightly increased lower extremity swelling.  Has been taking his medications as prescribed.  No dietary noncompliance reported.  Noted to be significantly hypoxemic, tachypneic and tachycardic on presentation.  Initially was placed on BiPAP and given Lasix in the ER and had improvement in respiratory symptoms.  Subsequently placed on high flow nasal cannula. No fever, chills, cough, nausea, vomiting, abdominal pain.  12/8: Patient was admitted with acute on chronic CHF decompensation in the setting of LV dysfunction.  Prior LVEF 25-30%.  He appears to be diuresing well with IV Lasix, but remains on high flow nasal cannula oxygen with minimal shortness of breath.  He is maintaining soft blood pressure readings.  Troponins are becoming elevated and this is likely reflective of demand ischemia.  Assessment & Plan:   Active Problems:   Barrett's esophagus   Hyperlipidemia   Alcohol abuse   Ischemic cardiomyopathy   Acute on chronic systolic CHF (congestive heart failure) (HCC)   CAD (coronary artery disease)   Hypothyroid   COPD (chronic obstructive pulmonary disease) (HCC)   Depression   Sleep apnea   Hypertension   CHF exacerbation (HCC)   Acute hypoxemic respiratory failure secondary to acute on chronic systolic CHF exacerbation -Patient noted to have soft blood pressure readings  -2D echocardiogram 08/17/2019 shows LVEF 25-30% -Continue telemetry monitoring in stepdown unit with soft blood pressure readings noted, --Hold Entresto and Coreg to allow BP for diuresing -Continue IV Lasix 40 mg twice daily, hold if BP <100 -Continue spironolactone  -Monitor I's and O's as well as daily weights  -Appreciate cardiology evaluation -Wean oxygen as tolerated (currently on 4-5L) --May need to consider ICD  Elevated troponin likely secondary to demand ischemia related to above -EKG with no acute changes and baseline LBBB noted -Appreciate cardiology input  Mild hypokalemia-persistent -Continue to replete orally with aggressive diuresis  CKD stage III (baseline creatinine 1.5-1.6) -Current creatinine of 1.43 -Continue to monitor closely with diuresis  Hyperlipidemia -Continue atorvastatin  Depression -Continue Seroquel and bupropion  OSA  Hypothyroidism -Levothyroxine  GERD -PPI  Tobacco abuse -Reduce tobacco use recently.   DVT prophylaxis: Heparin Code Status: Full Family Communication: None at bedside Disposition Plan: Continue diuresis for CHF exacerbation and wean oxygen as tolerated.  Appreciate cardiology evaluation.  Monitor soft blood pressure readings.    Consultants:   Cardiology  Procedures:   None  Antimicrobials:   None   Subjective: Reported breathing improved.  No swelling.  No fever, chest pain, abdominal pain, N/V/D, dysuria, dizziness.   Objective: Vitals:   10/19/19 1300 10/19/19 1600 10/19/19 1700 10/19/19 1705  BP: (!) 80/59 (!) 84/58 107/71   Pulse: 84 93 94 88  Resp: (!) 24 15 16 18   Temp:    97.6 F (36.4 C)  TempSrc:    Oral  SpO2: 96% 98% 94% 93%  Weight:      Height:        Intake/Output Summary (  Last 24 hours) at 10/19/2019 2333 Last data filed at 10/19/2019 1800 Gross per 24 hour  Intake 1000 ml  Output 1750 ml  Net -750 ml   Filed Weights   10/18/19 0003 10/18/19 0500 10/19/19 0500  Weight:  104.5 kg 104.5 kg 104.9 kg    Examination:  Constitutional: NAD, AAOx3 HEENT: conjunctivae and lids normal, EOMI CV: RRR no M,R,G. Distal pulses +2.  No cyanosis.   RESP: CTA B/L, normal respiratory effort, on 4.5L GI: +BS, NTND Extremities: No effusions, edema, or tenderness in BLE MSK: normal ROM and strength, no joint enlargement or tenderness of both UE and LE SKIN: warm, dry, large areas of scaly skin Neuro: II - XII grossly intact.  Sensation intact Psych: Normal mood and affect.  Appropriate judgement and reason   Data Reviewed: I have personally reviewed following labs and imaging studies  CBC: Recent Labs  Lab 10/17/19 1847 10/18/19 0105 10/19/19 0409  WBC 11.0* 7.3 10.1  NEUTROABS 6.7  --   --   HGB 14.6 13.5 11.4*  HCT 45.7 41.3 35.3*  MCV 96.6 95.6 95.7  PLT 255 189 0000000   Basic Metabolic Panel: Recent Labs  Lab 10/17/19 1847 10/18/19 0105 10/19/19 0409  NA 141 141 141  K 3.3* 3.2* 3.4*  CL 108 107 106  CO2 17* 24 25  GLUCOSE 232* 146* 114*  BUN 19 19 28*  CREATININE 1.64* 1.43* 1.41*  CALCIUM 9.4 9.1 9.1  MG  --   --  2.1   GFR: Estimated Creatinine Clearance: 66.8 mL/min (A) (by C-G formula based on SCr of 1.41 mg/dL (H)). Liver Function Tests: Recent Labs  Lab 10/17/19 1847  AST 22  ALT 19  ALKPHOS 82  BILITOT 1.2  PROT 7.7  ALBUMIN 4.4   No results for input(s): LIPASE, AMYLASE in the last 168 hours. No results for input(s): AMMONIA in the last 168 hours. Coagulation Profile: No results for input(s): INR, PROTIME in the last 168 hours. Cardiac Enzymes: No results for input(s): CKTOTAL, CKMB, CKMBINDEX, TROPONINI in the last 168 hours. BNP (last 3 results) Recent Labs    07/26/19 1102  PROBNP 992*   HbA1C: No results for input(s): HGBA1C in the last 72 hours. CBG: No results for input(s): GLUCAP in the last 168 hours. Lipid Profile: No results for input(s): CHOL, HDL, LDLCALC, TRIG, CHOLHDL, LDLDIRECT in the last 72 hours.  Thyroid Function Tests: No results for input(s): TSH, T4TOTAL, FREET4, T3FREE, THYROIDAB in the last 72 hours. Anemia Panel: No results for input(s): VITAMINB12, FOLATE, FERRITIN, TIBC, IRON, RETICCTPCT in the last 72 hours. Sepsis Labs: No results for input(s): PROCALCITON, LATICACIDVEN in the last 168 hours.  Recent Results (from the past 240 hour(s))  Respiratory Panel by RT PCR (Flu A&B, Covid) - Nasopharyngeal Swab     Status: None   Collection Time: 10/17/19  9:28 PM   Specimen: Nasopharyngeal Swab  Result Value Ref Range Status   SARS Coronavirus 2 by RT PCR NEGATIVE NEGATIVE Final    Comment: (NOTE) SARS-CoV-2 target nucleic acids are NOT DETECTED. The SARS-CoV-2 RNA is generally detectable in upper respiratoy specimens during the acute phase of infection. The lowest concentration of SARS-CoV-2 viral copies this assay can detect is 131 copies/mL. A negative result does not preclude SARS-Cov-2 infection and should not be used as the sole basis for treatment or other patient management decisions. A negative result may occur with  improper specimen collection/handling, submission of specimen other than nasopharyngeal swab, presence of  viral mutation(s) within the areas targeted by this assay, and inadequate number of viral copies (<131 copies/mL). A negative result must be combined with clinical observations, patient history, and epidemiological information. The expected result is Negative. Fact Sheet for Patients:  PinkCheek.be Fact Sheet for Healthcare Providers:  GravelBags.it This test is not yet ap proved or cleared by the [place] FDA and  has been authorized for detection and/or diagnosis of SARS-CoV-2 by FDA under an Emergency Use Authorization (EUA). This EUA will remain  in effect (meaning this test can be used) for the duration of the COVID-19 declaration under Section 564(b)(1) of the Act, 21 U.S.C.  section 360bbb-3(b)(1), unless the authorization is terminated or revoked sooner.    Influenza A by PCR NEGATIVE NEGATIVE Final   Influenza B by PCR NEGATIVE NEGATIVE Final    Comment: (NOTE) The Xpert Xpress SARS-CoV-2/FLU/RSV assay is intended as an aid in  the diagnosis of influenza from Nasopharyngeal swab specimens and  should not be used as a sole basis for treatment. Nasal washings and  aspirates are unacceptable for Xpert Xpress SARS-CoV-2/FLU/RSV  testing. Fact Sheet for Patients: PinkCheek.be Fact Sheet for Healthcare Providers: GravelBags.it This test is not yet approved or cleared by the [place] FDA and  has been authorized for detection and/or diagnosis of SARS-CoV-2 by  FDA under an Emergency Use Authorization (EUA). This EUA will remain  in effect (meaning this test can be used) for the duration of the  Covid-19 declaration under Section 564(b)(1) of the Act, 21  U.S.C. section 360bbb-3(b)(1), unless the authorization is  terminated or revoked. Performed at [facility], [address], [place], [place] [address]   MRSA PCR Screening     Status: None   Collection Time: 10/17/19 11:56 PM   Specimen: Nasal Mucosa; Nasopharyngeal  Result Value Ref Range Status   MRSA by PCR NEGATIVE NEGATIVE Final    Comment:        The GeneXpert MRSA Assay (FDA approved for NASAL specimens only), is one component of a comprehensive MRSA colonization surveillance program. It is not intended to diagnose MRSA infection nor to guide or monitor treatment for MRSA infections. Performed at [facility], [address], [place], [place] [address]          Radiology Studies: No results found.      Scheduled Meds: . ARIPiprazole  15 mg Oral QHS  . aspirin  81 mg Oral Daily  . atorvastatin  80 mg Oral q1800  . buPROPion  300 mg Oral Q breakfast   And  . buPROPion  150 mg Oral QPC lunch  . chlorhexidine  15 mL  Mouth Rinse BID  . Chlorhexidine Gluconate Cloth  6 each Topical Daily  . clopidogrel  75 mg Oral Daily  . folic acid  1 mg Oral Daily  . furosemide  40 mg Intravenous BID  . guaiFENesin  1,200 mg Oral BID  . levothyroxine  125 mcg Oral Q0600  . mouth rinse  15 mL Mouth Rinse q12n4p  . multivitamin with minerals  1 tablet Oral q morning - 10a  . pantoprazole  40 mg Oral Daily  . potassium chloride  40 mEq Oral BID  . QUEtiapine  100 mg Oral QHS  . sodium chloride flush  3 mL Intravenous Q12H  . spironolactone  12.5 mg Oral Daily  . thiamine  100 mg Oral Daily   Continuous Infusions: . sodium chloride       LOS: 2 days  Enzo Bi, MD [facility] Pager [phone]  If 7PM-7AM, please contact night-coverage [email or web address] Password [facility] 10/19/2019, 11:33 PM

## 2019-10-19 NOTE — Progress Notes (Signed)
Checked on patient to place on CPAP. Patient states he is not sleepy at this time and will get RN to call me when he is ready to be placed on his CPAP.

## 2019-10-20 LAB — CBC
HCT: 39.2 % (ref 39.0–52.0)
Hemoglobin: 12.5 g/dL — ABNORMAL LOW (ref 13.0–17.0)
MCH: 31 pg (ref 26.0–34.0)
MCHC: 31.9 g/dL (ref 30.0–36.0)
MCV: 97.3 fL (ref 80.0–100.0)
Platelets: 189 10*3/uL (ref 150–400)
RBC: 4.03 MIL/uL — ABNORMAL LOW (ref 4.22–5.81)
RDW: 16.7 % — ABNORMAL HIGH (ref 11.5–15.5)
WBC: 8 10*3/uL (ref 4.0–10.5)
nRBC: 0.2 % (ref 0.0–0.2)

## 2019-10-20 LAB — MAGNESIUM: Magnesium: 2.3 mg/dL (ref 1.7–2.4)

## 2019-10-20 LAB — BASIC METABOLIC PANEL
Anion gap: 9 (ref 5–15)
BUN: 27 mg/dL — ABNORMAL HIGH (ref 8–23)
CO2: 25 mmol/L (ref 22–32)
Calcium: 9.5 mg/dL (ref 8.9–10.3)
Chloride: 107 mmol/L (ref 98–111)
Creatinine, Ser: 1.23 mg/dL (ref 0.61–1.24)
GFR calc Af Amer: >60 mL/min (ref >60)
GFR calc non Af Amer: >60 mL/min (ref >60)
Glucose, Bld: 94 mg/dL (ref 70–99)
Potassium: 4.3 mmol/L (ref 3.5–5.1)
Sodium: 141 mmol/L (ref 135–145)

## 2019-10-20 MED ORDER — FUROSEMIDE 40 MG PO TABS
40.0000 mg | ORAL_TABLET | Freq: Every day | ORAL | Status: DC
  Administered 2019-10-20: 40 mg via ORAL
  Filled 2019-10-20: qty 1

## 2019-10-20 NOTE — Progress Notes (Signed)
Progress Note  Patient Name: Tony Alvarez Date of Encounter: 10/20/2019  Primary Cardiologist: Shelva Majestic, MD   Subjective   Shortness of breath gradually improving. Denies chest pain.  Inpatient Medications    Scheduled Meds:  ARIPiprazole  15 mg Oral QHS   aspirin  81 mg Oral Daily   atorvastatin  80 mg Oral q1800   buPROPion  300 mg Oral Q breakfast   And   buPROPion  150 mg Oral QPC lunch   chlorhexidine  15 mL Mouth Rinse BID   Chlorhexidine Gluconate Cloth  6 each Topical Daily   clopidogrel  75 mg Oral Daily   folic acid  1 mg Oral Daily   furosemide  40 mg Oral Daily   guaiFENesin  1,200 mg Oral BID   levothyroxine  125 mcg Oral Q0600   mouth rinse  15 mL Mouth Rinse q12n4p   multivitamin with minerals  1 tablet Oral q morning - 10a   pantoprazole  40 mg Oral Daily   potassium chloride  40 mEq Oral BID   QUEtiapine  100 mg Oral QHS   sodium chloride flush  3 mL Intravenous Q12H   spironolactone  12.5 mg Oral Daily   thiamine  100 mg Oral Daily   Continuous Infusions:  sodium chloride     PRN Meds: sodium chloride, acetaminophen **OR** acetaminophen, albuterol, bisacodyl, nitroGLYCERIN, ondansetron **OR** ondansetron (ZOFRAN) IV, polyethylene glycol, sodium chloride flush, traMADol   Vital Signs    Vitals:   10/20/19 0300 10/20/19 0400 10/20/19 0500 10/20/19 0600  BP: 99/75 98/71 103/73 96/70  Pulse: 96 90 89 86  Resp: 15 14 17 10   Temp:  97.8 F (36.6 C)    TempSrc:      SpO2: 97% 100% 96% 99%  Weight:      Height:        Intake/Output Summary (Last 24 hours) at 10/20/2019 0903 Last data filed at 10/19/2019 2130 Gross per 24 hour  Intake 1000 ml  Output 1900 ml  Net -900 ml   Filed Weights   10/18/19 0003 10/18/19 0500 10/19/19 0500  Weight: 104.5 kg 104.5 kg 104.9 kg    Telemetry    NSR with PVC's - Personally Reviewed  Physical Exam   GEN: No acute distress.   Neck: No JVD Cardiac: RRR, no murmurs, rubs,  or gallops.  Respiratory: Faint bibasilar rales. GI: Soft, nontender, non-distended  MS: No edema; No deformity. Neuro:  Nonfocal  Psych: Normal affect   Labs    Chemistry Recent Labs  Lab 10/17/19 1847 10/18/19 0105 10/19/19 0409  NA 141 141 141  K 3.3* 3.2* 3.4*  CL 108 107 106  CO2 17* 24 25  GLUCOSE 232* 146* 114*  BUN 19 19 28*  CREATININE 1.64* 1.43* 1.41*  CALCIUM 9.4 9.1 9.1  PROT 7.7  --   --   ALBUMIN 4.4  --   --   AST 22  --   --   ALT 19  --   --   ALKPHOS 82  --   --   BILITOT 1.2  --   --   GFRNONAA 44* 52* 53*  GFRAA 52* >60 >60  ANIONGAP 16* 10 10     Hematology Recent Labs  Lab 10/17/19 1847 10/18/19 0105 10/19/19 0409  WBC 11.0* 7.3 10.1  RBC 4.73 4.32 3.69*  HGB 14.6 13.5 11.4*  HCT 45.7 41.3 35.3*  MCV 96.6 95.6 95.7  MCH 30.9 31.3 30.9  MCHC 31.9 32.7 32.3  RDW 16.7* 16.5* 16.7*  PLT 255 189 179    Cardiac EnzymesNo results for input(s): TROPONINI in the last 168 hours. No results for input(s): TROPIPOC in the last 168 hours.   BNP Recent Labs  Lab 10/17/19 1847  BNP 1,622.0*     DDimer No results for input(s): DDIMER in the last 168 hours.   Radiology    No results found.  Cardiac Studies   Relevant CV Studies:  Cardiac Catheterization: 01/2019  Prox RCA lesion is 20% stenosed.  Prox Cx lesion is 20% stenosed.  Prox LAD-1 lesion is 100% stenosed.  Post intervention, there is a 0% residual stenosis.  Prox LAD-2 lesion is 20% stenosed.  Mid LAD lesion is 40% stenosed.  LV end diastolic pressure is mildly elevated.  There is moderate left ventricular systolic dysfunction.  A stent was successfully placed.  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  Almost a common ostium left main with immediate trifurcation into the LAD, small ramus intermediate vessel and moderate  size circumflex vessel. The ramus vessel is normal. The circumflex vessel has 20% proximal narrowing and gave rise to 2 marginal branches. The RCA is a dominant vessel with moderate luminal irregularity with 20% narrowing proximally 40% mid stenosis and 30% narrowing proximal to the acute margin.  Probable moderate acute LV dysfunction with hypocontractility involving the anterolateral wall and apex. LVEDP 22 mm.  Successful PCI to the totally occluded LAD with PTCA and ultimate insertion of a 3.0 x 22 mm Resolute Onyx stent postdilated to 3.26 mm with the 100% occlusion being reduced to 0% and TIMI 0 flow improving to TIMI-3 flow. Once reperfusion was established, LAD had 20% mid LAD smooth narrowing and 40% distal smooth stenosis. The LAD wrapped around the apex and supplied the distal third of the inferior wall.  RECOMMENDATION: DAPT for minimum of 1 year. Optimal blood pressure control with ideal blood pressure less than 120/80. High potency statin therapy with target LDL less than 70. Medical therapy for concomitant CAD. A 2D echo Doppler study will be ordered improved LV function assessment.  Echo: 01/2019 IMPRESSIONS   1. The left ventricle has moderately reduced systolic function, with an ejection fraction of 35-40%. There is mildly increased left ventricular wall thickness. 2. Severe akinesis of the left ventricular, mid-apical anteroseptal wall, anterior wall, apical segment and inferoapical wall. 3. Small, fixed thrombus on the anteroapical wall of the left ventricle.   Echocardiogram: 08/2019 IMPRESSIONS   1. Left ventricular ejection fraction, by visual estimation, is 25 to 30%. The left ventricle has severely decreased function. There is no left ventricular hypertrophy. 2. Left ventricular diastolic Doppler parameters are consistent with pseudonormalization pattern of LV diastolic filling. 3. There is akinesis of the mid- distal anterioseptal wall, apex  and apical lateral regions.  There is spontaneous contrast in apical LV . Definity contrast was used. There is no evidence of apical thrombi. 4. Global right ventricle has normal systolic function.The right ventricular size is normal. No increase in right ventricular wall thickness. 5. Left atrial size was mildly dilated. 6. Right atrial size was normal. 7. The mitral valve is normal in structure. Moderate mitral valve regurgitation. 8. The tricuspid valve is normal in structure. Tricuspid valve regurgitation is trivial. 9. The aortic valve is normal in structure. Aortic valve regurgitation was not visualized by color flow Doppler. 10.  The pulmonic valve was grossly normal. Pulmonic valve regurgitation is not visualized by color flow Doppler. 11. Aortic dilatation noted. 12. There is mild dilatation of the ascending aorta measuring 39 mm. 13. Normal pulmonary artery systolic pressure. 14. The atrial septum is grossly normal.  Patient Profile     63 y.o. male with past medical history of CAD (s/p STEMI in 01/2019 with DES to proximal LAD), chronic combined systolic and diastolic CHF/ICM (EF [id number] by echo in 01/2019, at 25% in 05/2019 and 25-30% by echo in 08/2019), HTN, HLD, Stage 3 CKD, tobacco use and OSAwho is being seen today for the evaluation ofCHFat the request ofDr. Manuella Ghazi.   Assessment & Plan     1. Acute on Chronic Combined Systolic and Diastolic CHF Exacerbation -900 cc output in last 24 hours.  He has a known reduced EF of 25 to 30% by most recent echocardiogram in 08/2019. By his report, weight had increased by 10 to 15 pounds on his home scales over the past few weeks but he did not start to experience symptoms until 10/17/2019. Denies any significant change in his dietary intake. -BNP was elevated to 1622 on admission and CXR was consistent with CHF.  -BUN up to 28, SCr stable at 1.41. I will start oral Lasix 40 mg daily.  -Continue to follow I's and  O's along with daily weights. Will need to establish a new baseline weight given his variable readings. -ContinueCoreg 3.125mg  BID, Spironolactone 12.5mg  dailyand Entresto 24-26mg  BID (reduced from prior dose of 49-51 mg twice daily due to soft blood pressures). Entresto and Coreg held yesterday as per medicine due to low BP.  Will need EP consult as an outpatient for consideration of ICD placement.   2. CAD/Elevated Troponin Values - he iss/p STEMI in 01/2019 with DES to proximal LAD. He denies any recent chest pain. Troponin values have been flat at49 with repeat values of 69, 110 and 128this admission which is consistent with demand ischemia in the setting of an acute CHF exacerbation.  - continue ASA, Plavix, BB and statin therapy.   3. History of LV Thrombus - was started on Xarelto following admission in 01/2019.  He has no active bleeding.  No apical thrombus seen with echocardiograms using contrast enhancement both in July 2020 in October 2020.  I discontinued Xarelto.  4.HLD - LDL at 59 in 03/2019. Continue Atorvastatin 80mg  daily.   5. Stage 3 CKD - previous baseline of 1.1 - 1.2 but elevated to 1.5 last admission. At 1.41 today. I will switch IV Lasix to oral 40 mg daily as BUN has risen to 28.  6. OSA - continued compliance with CPAP encouraged.   7. Tobacco Use - he was previously smoking 1 ppd prior to his MI and has reduced his use to 1-5 cigarettes per day.   For questions or updates, please contact [facility] Please consult [email or web address] for contact info under Cardiology/STEMI.      Signed, Kate Sable, MD  10/20/2019, 9:03 AM

## 2019-10-20 NOTE — Progress Notes (Addendum)
PROGRESS NOTE    Tony Alvarez  [id number] DOB: Feb 10, 1956 DOA: 10/17/2019 PCP: [facility]   Brief Narrative:  Per HPI: Tony Alvarez is a 63 y.o. male with medical history significant of CAD, ischemic cardiomyopathy, COPD, OSA, hypertension, hyperlipidemia, CHF, chronic alcoholism with prior withdrawal, Barretts esophagus, hypothyroidism and depression who presented to the ER with acute shortness of breath.  Patient states he started having increased dyspnea on exertion tonight.  Did report some chest discomfort but no obvious pain.  Does have a history of chronic CHF and also reports slightly increased lower extremity swelling.  Has been taking his medications as prescribed.  No dietary noncompliance reported.  Noted to be significantly hypoxemic, tachypneic and tachycardic on presentation.  Initially was placed on BiPAP and given Lasix in the ER and had improvement in respiratory symptoms.  Subsequently placed on high flow nasal cannula. No fever, chills, cough, nausea, vomiting, abdominal pain.  12/8: Patient was admitted with acute on chronic CHF decompensation in the setting of LV dysfunction.  Prior LVEF 25-30%.  He appears to be diuresing well with IV Lasix, but remains on high flow nasal cannula oxygen with minimal shortness of breath.  He is maintaining soft blood pressure readings.  Troponins are becoming elevated and this is likely reflective of demand ischemia.  Assessment & Plan:   Active Problems:   Barrett's esophagus   Hyperlipidemia   Alcohol abuse   Ischemic cardiomyopathy   Acute on chronic systolic CHF (congestive heart failure) (HCC)   CAD (coronary artery disease)   Hypothyroid   COPD (chronic obstructive pulmonary disease) (HCC)   Depression   Sleep apnea   Hypertension   CHF exacerbation (HCC)   Acute hypoxemic respiratory failure secondary to acute on chronic systolic CHF exacerbation -Patient noted to have soft blood pressure  readings -2D echocardiogram 08/17/2019 shows LVEF 25-30% -Continue telemetry monitoring in stepdown unit with soft blood pressure readings noted --Hold Entresto and Coreg to allow BP for diuresing -Switch to oral Lasix 40 mg daily by cards today -Continue spironolactone  -Monitor I's and O's as well as daily weights  -Appreciate cardiology evaluation -Wean oxygen as tolerated (currently on 2-3L) --EP for ICD as outpatient  Elevated troponin likely secondary to demand ischemia related to above -EKG with no acute changes and baseline LBBB noted -Appreciate cardiology input  Mild hypokalemia-persistent -Continue to replete orally with aggressive diuresis  CKD stage III (baseline creatinine 1.5-1.6) -Current creatinine of 1.43 -Continue to monitor closely with diuresis  Hyperlipidemia -Continue atorvastatin  Depression -Continue Seroquel and bupropion  OSA  Hypothyroidism -Levothyroxine  GERD -PPI  Tobacco abuse -Reduce tobacco use recently.   DVT prophylaxis: Heparin Code Status: Full Family Communication: None at bedside Disposition Plan: Home in 1-2 days.  Likely will need suppl O2 for home.   Consultants:   Cardiology  Procedures:   None  Antimicrobials:   None   Subjective: Pt was able to stand and groom himself without dyspnea.  No fever, chest pain, abdominal pain, N/V/D, dysuria, increased swelling.    O2 requirement decreased to 2-3L while at rest.   Objective: Vitals:   10/20/19 1600 10/20/19 1800 10/20/19 1900 10/20/19 2041  BP: 98/78 116/84 105/76   Pulse: 99 100 96   Resp: 18 (!) 25 17   Temp:      TempSrc:      SpO2: 92% 94% 94% 95%  Weight:      Height:        Intake/Output  Summary (Last 24 hours) at 10/20/2019 2100 Last data filed at 10/20/2019 1300 Gross per 24 hour  Intake 600 ml  Output 600 ml  Net 0 ml   Filed Weights   10/18/19 0003 10/18/19 0500 10/19/19 0500  Weight: 104.5 kg 104.5 kg 104.9 kg     Examination:  Constitutional: NAD, AAOx3 HEENT: conjunctivae and lids normal, EOMI CV: RRR no M,R,G. Distal pulses +2.  No cyanosis.   RESP: Mild crackles at posterior bases, normal respiratory effort, on 2-3L GI: +BS, NTND Extremities: No effusions, edema, or tenderness in BLE MSK: normal ROM and strength, no joint enlargement or tenderness of both UE and LE SKIN: warm, dry, large areas of scaly skin Neuro: II - XII grossly intact.  Sensation intact Psych: Normal mood and affect.  Appropriate judgement and reason   Data Reviewed: I have personally reviewed following labs and imaging studies  CBC: Recent Labs  Lab 10/17/19 1847 10/18/19 0105 10/19/19 0409 10/20/19 1356  WBC 11.0* 7.3 10.1 8.0  NEUTROABS 6.7  --   --   --   HGB 14.6 13.5 11.4* 12.5*  HCT 45.7 41.3 35.3* 39.2  MCV 96.6 95.6 95.7 97.3  PLT 255 189 179 [id number]   Basic Metabolic Panel: Recent Labs  Lab 10/17/19 1847 10/18/19 0105 10/19/19 0409 10/20/19 1356  NA 141 141 141 141  K 3.3* 3.2* 3.4* 4.3  CL 108 107 106 107  CO2 17* 24 25 25   GLUCOSE 232* 146* 114* 94  BUN 19 19 28* 27*  CREATININE 1.64* 1.43* 1.41* 1.23  CALCIUM 9.4 9.1 9.1 9.5  MG  --   --  2.1 2.3   GFR: Estimated Creatinine Clearance: 76.5 mL/min (by C-G formula based on SCr of 1.23 mg/dL). Liver Function Tests: Recent Labs  Lab 10/17/19 1847  AST 22  ALT 19  ALKPHOS 82  BILITOT 1.2  PROT 7.7  ALBUMIN 4.4   No results for input(s): LIPASE, AMYLASE in the last 168 hours. No results for input(s): AMMONIA in the last 168 hours. Coagulation Profile: No results for input(s): INR, PROTIME in the last 168 hours. Cardiac Enzymes: No results for input(s): CKTOTAL, CKMB, CKMBINDEX, TROPONINI in the last 168 hours. BNP (last 3 results) Recent Labs    07/26/19 1102  PROBNP 992*   HbA1C: No results for input(s): HGBA1C in the last 72 hours. CBG: No results for input(s): GLUCAP in the last 168 hours. Lipid Profile: No results  for input(s): CHOL, HDL, LDLCALC, TRIG, CHOLHDL, LDLDIRECT in the last 72 hours. Thyroid Function Tests: No results for input(s): TSH, T4TOTAL, FREET4, T3FREE, THYROIDAB in the last 72 hours. Anemia Panel: No results for input(s): VITAMINB12, FOLATE, FERRITIN, TIBC, IRON, RETICCTPCT in the last 72 hours. Sepsis Labs: No results for input(s): PROCALCITON, LATICACIDVEN in the last 168 hours.  Recent Results (from the past 240 hour(s))  Respiratory Panel by RT PCR (Flu A&B, Covid) - Nasopharyngeal Swab     Status: None   Collection Time: 10/17/19  9:28 PM   Specimen: Nasopharyngeal Swab  Result Value Ref Range Status   SARS Coronavirus 2 by RT PCR NEGATIVE NEGATIVE Final    Comment: (NOTE) SARS-CoV-2 target nucleic acids are NOT DETECTED. The SARS-CoV-2 RNA is generally detectable in upper respiratoy specimens during the acute phase of infection. The lowest concentration of SARS-CoV-2 viral copies this assay can detect is 131 copies/mL. A negative result does not preclude SARS-Cov-2 infection and should not be used as the sole basis for treatment or  other patient management decisions. A negative result may occur with  improper specimen collection/handling, submission of specimen other than nasopharyngeal swab, presence of viral mutation(s) within the areas targeted by this assay, and inadequate number of viral copies (<131 copies/mL). A negative result must be combined with clinical observations, patient history, and epidemiological information. The expected result is Negative. Fact Sheet for Patients:  PinkCheek.be Fact Sheet for Healthcare Providers:  GravelBags.it This test is not yet ap proved or cleared by the [place] FDA and  has been authorized for detection and/or diagnosis of SARS-CoV-2 by FDA under an Emergency Use Authorization (EUA). This EUA will remain  in effect (meaning this test can be used) for the  duration of the COVID-19 declaration under Section 564(b)(1) of the Act, 21 U.S.C. section 360bbb-3(b)(1), unless the authorization is terminated or revoked sooner.    Influenza A by PCR NEGATIVE NEGATIVE Final   Influenza B by PCR NEGATIVE NEGATIVE Final    Comment: (NOTE) The Xpert Xpress SARS-CoV-2/FLU/RSV assay is intended as an aid in  the diagnosis of influenza from Nasopharyngeal swab specimens and  should not be used as a sole basis for treatment. Nasal washings and  aspirates are unacceptable for Xpert Xpress SARS-CoV-2/FLU/RSV  testing. Fact Sheet for Patients: PinkCheek.be Fact Sheet for Healthcare Providers: GravelBags.it This test is not yet approved or cleared by the [place] FDA and  has been authorized for detection and/or diagnosis of SARS-CoV-2 by  FDA under an Emergency Use Authorization (EUA). This EUA will remain  in effect (meaning this test can be used) for the duration of the  Covid-19 declaration under Section 564(b)(1) of the Act, 21  U.S.C. section 360bbb-3(b)(1), unless the authorization is  terminated or revoked. Performed at [facility], [address] [place], [place] [address]   MRSA PCR Screening     Status: None   Collection Time: 10/17/19 11:56 PM   Specimen: Nasal Mucosa; Nasopharyngeal  Result Value Ref Range Status   MRSA by PCR NEGATIVE NEGATIVE Final    Comment:        The GeneXpert MRSA Assay (FDA approved for NASAL specimens only), is one component of a comprehensive MRSA colonization surveillance program. It is not intended to diagnose MRSA infection nor to guide or monitor treatment for MRSA infections. Performed at [facility], [address], [place], [place] [address]          Radiology Studies: No results found.      Scheduled Meds: . ARIPiprazole  15 mg Oral QHS  . aspirin  81 mg Oral Daily  . atorvastatin  80 mg Oral q1800  . buPROPion  300 mg  Oral Q breakfast   And  . buPROPion  150 mg Oral QPC lunch  . chlorhexidine  15 mL Mouth Rinse BID  . Chlorhexidine Gluconate Cloth  6 each Topical Daily  . clopidogrel  75 mg Oral Daily  . folic acid  1 mg Oral Daily  . furosemide  40 mg Oral Daily  . guaiFENesin  1,200 mg Oral BID  . levothyroxine  125 mcg Oral Q0600  . mouth rinse  15 mL Mouth Rinse q12n4p  . multivitamin with minerals  1 tablet Oral q morning - 10a  . pantoprazole  40 mg Oral Daily  . potassium chloride  40 mEq Oral BID  . QUEtiapine  100 mg Oral QHS  . sodium chloride flush  3 mL Intravenous Q12H  . spironolactone  12.5 mg Oral Daily  . thiamine  100 mg Oral Daily   Continuous Infusions: . sodium chloride       LOS: 3 days     Enzo Bi, MD [facility] Pager [phone]  If 7PM-7AM, please contact night-coverage [email or web address] Password [email or web address] 10/20/2019, 9:00 PM

## 2019-10-21 ENCOUNTER — Other Ambulatory Visit: Payer: Self-pay

## 2019-10-21 ENCOUNTER — Encounter (HOSPITAL_COMMUNITY): Payer: Self-pay | Admitting: Emergency Medicine

## 2019-10-21 ENCOUNTER — Inpatient Hospital Stay (HOSPITAL_COMMUNITY)
Admission: EM | Admit: 2019-10-21 | Discharge: 2019-10-23 | Disposition: A | Discharge status: Home / Self Care | Payer: BC Managed Care – PPO | Source: Home / Self Care | Attending: Hospitalist | Admitting: Hospitalist

## 2019-10-21 ENCOUNTER — Emergency Department (HOSPITAL_COMMUNITY): Payer: BC Managed Care – PPO

## 2019-10-21 DIAGNOSIS — I509 Heart failure, unspecified: Secondary | ICD-10-CM

## 2019-10-21 DIAGNOSIS — R0602 Shortness of breath: Secondary | ICD-10-CM

## 2019-10-21 DIAGNOSIS — J9601 Acute respiratory failure with hypoxia: Secondary | ICD-10-CM

## 2019-10-21 LAB — BASIC METABOLIC PANEL
Anion gap: 9 (ref 5–15)
BUN: 27 mg/dL — ABNORMAL HIGH (ref 8–23)
CO2: 24 mmol/L (ref 22–32)
Calcium: 9.6 mg/dL (ref 8.9–10.3)
Chloride: 106 mmol/L (ref 98–111)
Creatinine, Ser: 1.23 mg/dL (ref 0.61–1.24)
GFR calc Af Amer: >60 mL/min (ref >60)
GFR calc non Af Amer: >60 mL/min (ref >60)
Glucose, Bld: 128 mg/dL — ABNORMAL HIGH (ref 70–99)
Potassium: 4.2 mmol/L (ref 3.5–5.1)
Sodium: 139 mmol/L (ref 135–145)

## 2019-10-21 LAB — CBC WITH DIFFERENTIAL/PLATELET
Abs Immature Granulocytes: 0.04 10*3/uL (ref 0.00–0.07)
Basophils Absolute: 0.1 10*3/uL (ref 0.0–0.1)
Basophils Relative: 1 %
Eosinophils Absolute: 0.2 10*3/uL (ref 0.0–0.5)
Eosinophils Relative: 2 %
HCT: 40.1 % (ref 39.0–52.0)
Hemoglobin: 12.8 g/dL — ABNORMAL LOW (ref 13.0–17.0)
Immature Granulocytes: 0 %
Lymphocytes Relative: 18 %
Lymphs Abs: 1.7 10*3/uL (ref 0.7–4.0)
MCH: 30.8 pg (ref 26.0–34.0)
MCHC: 31.9 g/dL (ref 30.0–36.0)
MCV: 96.4 fL (ref 80.0–100.0)
Monocytes Absolute: 0.5 10*3/uL (ref 0.1–1.0)
Monocytes Relative: 5 %
Neutro Abs: 7.2 10*3/uL (ref 1.7–7.7)
Neutrophils Relative %: 74 %
Platelets: 198 10*3/uL (ref 150–400)
RBC: 4.16 MIL/uL — ABNORMAL LOW (ref 4.22–5.81)
RDW: 16.2 % — ABNORMAL HIGH (ref 11.5–15.5)
WBC: 9.7 10*3/uL (ref 4.0–10.5)
nRBC: 0 % (ref 0.0–0.2)

## 2019-10-21 LAB — TROPONIN I (HIGH SENSITIVITY): Troponin I (High Sensitivity): 63 ng/L — ABNORMAL HIGH (ref <18)

## 2019-10-21 LAB — BRAIN NATRIURETIC PEPTIDE: B Natriuretic Peptide: 523 pg/mL — ABNORMAL HIGH (ref 0.0–100.0)

## 2019-10-21 MED ORDER — ENTRESTO 49-51 MG PO TABS: ORAL_TABLET | ORAL | 6 refills | Status: DC

## 2019-10-21 MED ORDER — FUROSEMIDE 10 MG/ML IJ SOLN
40.0000 mg | Freq: Once | INTRAMUSCULAR | Status: AC
  Administered 2019-10-21: 40 mg via INTRAVENOUS
  Filled 2019-10-21: qty 4

## 2019-10-21 MED ORDER — CARVEDILOL 3.125 MG PO TABS
3.1250 mg | ORAL_TABLET | Freq: Two times a day (BID) | ORAL | Status: DC
  Administered 2019-10-21: 3.125 mg via ORAL
  Filled 2019-10-21: qty 1

## 2019-10-21 MED ORDER — FUROSEMIDE 10 MG/ML IJ SOLN
80.0000 mg | Freq: Once | INTRAMUSCULAR | Status: AC
  Administered 2019-10-21: 80 mg via INTRAVENOUS
  Filled 2019-10-21: qty 8

## 2019-10-21 MED ORDER — GUAIFENESIN ER 600 MG PO TB12: 1200.0000 mg | ORAL_TABLET | Freq: Two times a day (BID) | ORAL | 0 refills | Status: DC | PRN

## 2019-10-21 MED ORDER — FUROSEMIDE 40 MG PO TABS: 40.0000 mg | ORAL_TABLET | Freq: Two times a day (BID) | ORAL | 2 refills | Status: DC

## 2019-10-21 MED ORDER — POTASSIUM CHLORIDE CRYS ER 20 MEQ PO TBCR: 40.0000 meq | EXTENDED_RELEASE_TABLET | Freq: Every day | ORAL | 0 refills | Status: DC

## 2019-10-21 MED ORDER — ASPIRIN 81 MG PO CHEW: 81.0000 mg | CHEWABLE_TABLET | Freq: Every day | ORAL | Status: DC

## 2019-10-21 NOTE — Progress Notes (Signed)
Patient unable to wear CPAP. RN placed patient back on nasal cannula. Patient also did not wear CPAP long the night before.

## 2019-10-21 NOTE — ED Triage Notes (Addendum)
Patient to the ED with shortness of breath via EMS after being discharged from the ICU  earlier today. Home oxygen was discussed prior to discharge, however the patient passed his walk test today and went home without oxygen. Sats were 87 on room air and now 93 on 4 liters.

## 2019-10-21 NOTE — Progress Notes (Signed)
Discharge instructions provided. Pt verbalized understanding of discharge instructions and had no further questions. Pt stated he drove himself and states he does feel comfortable driving himself home.

## 2019-10-21 NOTE — Progress Notes (Signed)
SATURATION QUALIFICATIONS: (This note is used to comply with regulatory documentation for home oxygen)  Patient Saturations on Room Air at Rest = 94%  Patient Saturations on Room Air while Ambulating = 90%  Patient Saturations on 2 Liters of oxygen while Ambulating = 97%  Please briefly explain why patient needs home oxygen:  Pt was able to ambulate three times around unit with no oxygen. Saturations dropped down and maintained at 90% while ambulating. Pt did not complain of any shortness of breath while ambulating. On 2 L O2 pt able to maintain saturations at 97% while ambulating.

## 2019-10-21 NOTE — Discharge Summary (Signed)
Physician Discharge Summary   Tony Alvarez  male DOB: 30-Oct-1956  [id number]  PCP: [facility]  Admit date: 10/17/2019 Discharge date: 10/27/2019  Admitted From: home Disposition:  home Home Health: No CODE STATUS: Full code  Discharge Instructions    Diet - low sodium heart healthy   Complete by: As directed    Discharge instructions   Complete by: As directed    I have increased your Lasix to 40 mg twice a day (you used to take 20 mg daily).  Please weight yourself every morning.  If weight increases by 3 lbs in 1 day, or 5 lbs in 1 week, please call your outpatient doctor.  Please follow up with your primary care doctor 1-2 weeks after discharge to get lab work done to check your kidney function and electrolytes, and based on the lab results, your outpatient doctor needs to determine if you should continue or change Lasix dosing and potassium supplements.    I am holding your home Entresto because your blood pressure has been low during your hospitalization.  Please follow up with your cardiologist to see if/when this medication can be resumed.  You will need to see an electrophysiologist about placing an ICD (defibrillator) for your heart.   Dr. Enzo Bi   Increase activity slowly   Complete by: As directed       Hospital Course:  For full details, please see H&P, progress notes, consult notes and ancillary notes.  Briefly,  Tony Alvarez a 63 y.o.Caucasian malewith medical history significant ofCAD, ischemic cardiomyopathy, COPD, OSA, hypertension, hyperlipidemia, CHF, chronic alcoholism with prior withdrawal, Barretts esophagus, hypothyroidism and depressionwho presented to the ER with acute shortness of breath.  # Acute hypoxic respiratory failure secondary to Acute on Chronic Combined Systolic and Diastolic CHF Exacerbation # Chronic systolic CHF with EF [id number] On presentation, pt was afebrile, saturation 94% on 15 L high flow nasal  cannula.  BNP 1622.  CXR showed Cardiomegaly appears to have increased, more patchy airspace opacities bilaterally likely secondary to pulmonary edema.  Pt was diuresed with IV lasix 40 mg BID until 12/10 when it was transitioned to PO lasix 40 mg by cardiology.  Pt's O2 requirement gradually came down.  On the day of discharge, nurse walked with pt and noted "Pt was able to ambulate three times around unit with no oxygen. Saturations dropped down and maintained at 90% while ambulating."  Pt was therefore discharged without suppl O2.  Pt was discharged on PO Lasix to 40 mg twice a day (used to take 20 mg daily PTA).  Pt was given instruction to weigh himself every morning and follow up with PCP 1-2 weeks after discharge to check kidney function and electrolytes.    Home Entresto and Coreg were held while inpatient to allow BP room for diuresing.  Coreg was resumed at discharge, however, Delene Loll was held until PCP or cardiology followup because pt's BP was still on the low side.  Pt will need to see EP as outpatient for consideration of ICD placement.  # Elevated troponin likely secondary to demand ischemia  # Hx STEMI in 01/2019 with DES to proximal LAD He denied any recent chest pain. Troponin values have been flat at49 with repeat values of 69, 110 and 128this admission which is consistent with demand ischemia in the setting of an acute CHF exacerbation.  Pt was continued on ASA,Plavix, and statin therapy.  Cardiology was on board who recommended no additional ACS workup.  History of LV Thrombus Pt was started on Xarelto following admission in 01/2019.He has no active bleeding. No apical thrombus seen with echocardiograms using contrast enhancement both in July 2020 in October 2020. Cardiology discontinued Xarelto during this admission.  Mild hypokalemia-persistent Due to diuresis.  Pt will resume potassium suppl at discharge, and follow up with PCP 1-2 weeks after discharge to check  level.  CKD stage III (baseline creatinine 1.5-1.6) Cr had remained stable and at baseline during diuresis.  Hyperlipidemia Continued atorvastatin  Depression Continude Seroquel and bupropion  OSA non-compliant with CPAP Pt noted to be intolerant of CPAP during this hospitalization.    Hypothyroidism Continued Levothyroxine  GERD Continued PPI  Tobacco Use Pt was previously smoking 1 ppd prior to his MI and has reduced his use to 1-5 cigarettes per day.   Discharge Diagnoses:  Active Problems:   Barrett's esophagus   Hyperlipidemia   Alcohol abuse   Ischemic cardiomyopathy   Acute on chronic systolic CHF (congestive heart failure) (HCC)   CAD (coronary artery disease)   Hypothyroid   COPD (chronic obstructive pulmonary disease) (HCC)   Depression   Sleep apnea   Hypertension   CHF exacerbation [facility])    Discharge Instructions:  Allergies as of 10/21/2019   No Known Allergies     Medication List    STOP taking these medications   Entresto 49-51 MG Generic drug: sacubitril-valsartan   rivaroxaban 20 MG Tabs tablet Commonly known as: XARELTO     TAKE these medications   acetaminophen 325 MG tablet Commonly known as: TYLENOL Take 2 tablets (650 mg total) by mouth every 6 (six) hours as needed for mild pain, fever or headache (or Fever >/= 101).   albuterol 108 (90 Base) MCG/ACT inhaler Commonly known as: VENTOLIN HFA Inhale 2 puffs into the lungs every 6 (six) hours as needed for wheezing or shortness of breath.   ARIPiprazole 15 MG tablet Commonly known as: ABILIFY Take 15 mg by mouth at bedtime.   aspirin 81 MG chewable tablet Chew 1 tablet (81 mg total) by mouth daily.   atorvastatin 80 MG tablet Commonly known as: LIPITOR Take 1 tablet (80 mg total) by mouth daily at 6 PM.   buPROPion 150 MG 12 hr tablet Commonly known as: WELLBUTRIN SR Take 150-300 mg by mouth 2 (two) times daily. Take two tablets (300 mg) by mouth every morning  and one tablet (150 mg) with lunch   carvedilol 3.125 MG tablet Commonly known as: COREG Take 1 tablet (3.125 mg total) by mouth 2 (two) times daily with a meal.   clopidogrel 75 MG tablet Commonly known as: PLAVIX Take 75 mg by mouth daily.   Eszopiclone 3 MG Tabs Take 3 mg by mouth at bedtime.   folic acid 1 MG tablet Commonly known as: FOLVITE Take 1 tablet (1 mg total) by mouth daily.   furosemide 40 MG tablet Commonly known as: LASIX Take 1 tablet (40 mg total) by mouth 2 (two) times daily. In early morning and early afternoon. What changed:   medication strength  how much to take  when to take this  additional instructions   guaiFENesin 600 MG 12 hr tablet Commonly known as: MUCINEX Take 2 tablets (1,200 mg total) by mouth 2 (two) times daily as needed. What changed:   when to take this  reasons to take this   levothyroxine 125 MCG tablet Commonly known as: SYNTHROID Take 125 mcg by mouth as needed.   multivitamin with minerals  Tabs tablet Take 1 tablet by mouth every morning.   nitroGLYCERIN 0.4 MG SL tablet Commonly known as: NITROSTAT Place 1 tablet (0.4 mg total) under the tongue every 5 (five) minutes x 3 doses as needed for chest pain.   pantoprazole 40 MG tablet Commonly known as: Protonix Take 1 tablet (40 mg total) by mouth daily.   potassium chloride SA 20 MEQ tablet Commonly known as: KLOR-CON Take 2 tablets (40 mEq total) by mouth daily.   QUEtiapine 100 MG tablet Commonly known as: SEROQUEL Take 50-100 mg by mouth at bedtime.   spironolactone 25 MG tablet Commonly known as: ALDACTONE Take 0.5 tablets (12.5 mg total) by mouth daily.   Viibryd 20 MG Tabs Generic drug: Vilazodone HCl Take 20 mg by mouth daily.         No Known Allergies   The results of significant diagnostics from this hospitalization (including imaging, microbiology, ancillary and laboratory) are listed below for reference.    Consultations:   Procedures/Studies: DG Chest 1 View  Result Date: 10/21/2019 CLINICAL DATA:  Shortness of breath EXAM: CHEST  1 VIEW COMPARISON:  10/17/2019 FINDINGS: Stable cardiomegaly. Pulmonary vascular congestion with diffuse interstitial opacities. Mild patchy alveolar opacities have decreased from prior. Probable trace bilateral pleural effusions. No pneumothorax. IMPRESSION: 1. Cardiomegaly with pulmonary vascular congestion and diffuse interstitial edema. 2. Mild patchy alveolar opacities have slightly improved from prior. Electronically Signed   By: Davina Poke M.D.   On: 10/21/2019 21:36   DG Chest Portable 1 View  Result Date: 10/17/2019 CLINICAL DATA:  Shortness of breath EXAM: PORTABLE CHEST 1 VIEW COMPARISON:  Radiograph 09/11/2019 FINDINGS: There is diffuse hazy opacity throughout both lungs with cephalized, indistinct pulmonary vascularity and likely trace left effusion tracking in the fissure. More patchy airspace opacities are present in the periphery of the lungs. Cardiomegaly is similar to slightly increased from comparison portable radiograph. Calcified aorta is similar to prior. No pneumothorax. No acute osseous or soft tissue abnormality. Degenerative changes are present in the imaged spine and shoulders. IMPRESSION: 1. Findings consistent with moderate to severe CHF. 2. Cardiomegaly similar to slightly increased from prior. 3. More patchy airspace opacities in the periphery of the lungs could represent alveolar edema or pneumonia. 4. Trace left pleural effusion. Aortic Atherosclerosis (ICD10-I70.0). Electronically Signed   By: Lovena Le M.D.   On: 10/17/2019 19:31      Labs: BNP (last 3 results) Recent Labs    09/12/19 0423 10/17/19 1847 10/21/19 2220  BNP 758.0* 1,622.0* [id number]*   Basic Metabolic Panel: Recent Labs  Lab 10/21/19 2219 10/22/19 0612  NA 139 141  K 4.2 3.8  CL 106 106  CO2 24 23  GLUCOSE 128* 101*  BUN 27* 25*  CREATININE 1.23  1.25*  CALCIUM 9.6 9.3   Liver Function Tests: Recent Labs  Lab 10/22/19 0612  AST 17  ALT 21  ALKPHOS 63  BILITOT 1.5*  PROT 6.6  ALBUMIN 3.9   No results for input(s): LIPASE, AMYLASE in the last 168 hours. No results for input(s): AMMONIA in the last 168 hours. CBC: Recent Labs  Lab 10/21/19 2219 10/22/19 0612  WBC 9.7 8.0  NEUTROABS 7.2  --   HGB 12.8* 12.4*  HCT 40.1 38.7*  MCV 96.4 96.8  PLT 198 191   Cardiac Enzymes: No results for input(s): CKTOTAL, CKMB, CKMBINDEX, TROPONINI in the last 168 hours. BNP: Invalid input(s): POCBNP CBG: No results for input(s): GLUCAP in the last 168 hours. D-Dimer No results  for input(s): DDIMER in the last 72 hours. Hgb A1c No results for input(s): HGBA1C in the last 72 hours. Lipid Profile No results for input(s): CHOL, HDL, LDLCALC, TRIG, CHOLHDL, LDLDIRECT in the last 72 hours. Thyroid function studies No results for input(s): TSH, T4TOTAL, T3FREE, THYROIDAB in the last 72 hours.  Invalid input(s): FREET3 Anemia work up No results for input(s): VITAMINB12, FOLATE, FERRITIN, TIBC, IRON, RETICCTPCT in the last 72 hours. Urinalysis No results found for: COLORURINE, APPEARANCEUR, [place], [place], [place], [place], [place], [place], PROTEINUR, UROBILINOGEN, NITRITE, LEUKOCYTESUR Sepsis Labs Invalid input(s): PROCALCITONIN,  WBC,  LACTICIDVEN Microbiology Recent Results (from the past 240 hour(s))  Respiratory Panel by RT PCR (Flu A&B, Covid) - Nasopharyngeal Swab     Status: None   Collection Time: 10/17/19  9:28 PM   Specimen: Nasopharyngeal Swab  Result Value Ref Range Status   SARS Coronavirus 2 by RT PCR NEGATIVE NEGATIVE Final    Comment: (NOTE) SARS-CoV-2 target nucleic acids are NOT DETECTED. The SARS-CoV-2 RNA is generally detectable in upper respiratoy specimens during the acute phase of infection. The lowest concentration of SARS-CoV-2 viral copies this assay can detect is 131 copies/mL. A negative  result does not preclude SARS-Cov-2 infection and should not be used as the sole basis for treatment or other patient management decisions. A negative result may occur with  improper specimen collection/handling, submission of specimen other than nasopharyngeal swab, presence of viral mutation(s) within the areas targeted by this assay, and inadequate number of viral copies (<131 copies/mL). A negative result must be combined with clinical observations, patient history, and epidemiological information. The expected result is Negative. Fact Sheet for Patients:  PinkCheek.be Fact Sheet for Healthcare Providers:  GravelBags.it This test is not yet ap proved or cleared by the [place] FDA and  has been authorized for detection and/or diagnosis of SARS-CoV-2 by FDA under an Emergency Use Authorization (EUA). This EUA will remain  in effect (meaning this test can be used) for the duration of the COVID-19 declaration under Section 564(b)(1) of the Act, 21 U.S.C. section 360bbb-3(b)(1), unless the authorization is terminated or revoked sooner.    Influenza A by PCR NEGATIVE NEGATIVE Final   Influenza B by PCR NEGATIVE NEGATIVE Final    Comment: (NOTE) The Xpert Xpress SARS-CoV-2/FLU/RSV assay is intended as an aid in  the diagnosis of influenza from Nasopharyngeal swab specimens and  should not be used as a sole basis for treatment. Nasal washings and  aspirates are unacceptable for Xpert Xpress SARS-CoV-2/FLU/RSV  testing. Fact Sheet for Patients: PinkCheek.be Fact Sheet for Healthcare Providers: GravelBags.it This test is not yet approved or cleared by the [place] FDA and  has been authorized for detection and/or diagnosis of SARS-CoV-2 by  FDA under an Emergency Use Authorization (EUA). This EUA will remain  in effect (meaning this test can be used) for the  duration of the  Covid-19 declaration under Section 564(b)(1) of the Act, 21  U.S.C. section 360bbb-3(b)(1), unless the authorization is  terminated or revoked. Performed at [facility], [address] [place], [place] [address]   MRSA PCR Screening     Status: None   Collection Time: 10/17/19 11:56 PM   Specimen: Nasal Mucosa; Nasopharyngeal  Result Value Ref Range Status   MRSA by PCR NEGATIVE NEGATIVE Final    Comment:        The GeneXpert MRSA Assay (FDA approved for NASAL specimens only), is one component of a comprehensive MRSA colonization surveillance program. It is not  intended to diagnose MRSA infection nor to guide or monitor treatment for MRSA infections. Performed at [facility], [address] [place], [place] [address]   SARS CORONAVIRUS 2 (TAT 6-24 HRS) Nasopharyngeal Nasopharyngeal Swab     Status: None   Collection Time: 10/22/19 12:35 AM   Specimen: Nasopharyngeal Swab  Result Value Ref Range Status   SARS Coronavirus 2 NEGATIVE NEGATIVE Final    Comment: (NOTE) SARS-CoV-2 target nucleic acids are NOT DETECTED. The SARS-CoV-2 RNA is generally detectable in upper and lower respiratory specimens during the acute phase of infection. Negative results do not preclude SARS-CoV-2 infection, do not rule out co-infections with other pathogens, and should not be used as the sole basis for treatment or other patient management decisions. Negative results must be combined with clinical observations, patient history, and epidemiological information. The expected result is Negative. Fact Sheet for Patients: SugarRoll.be Fact Sheet for Healthcare Providers: https://www.woods-mathews.com/ This test is not yet approved or cleared by the [place] FDA and  has been authorized for detection and/or diagnosis of SARS-CoV-2 by FDA under an Emergency Use Authorization (EUA). This EUA will remain  in effect (meaning this test can  be used) for the duration of the COVID-19 declaration under Section 56 4(b)(1) of the Act, 21 U.S.C. section 360bbb-3(b)(1), unless the authorization is terminated or revoked sooner. Performed at [facility], [place] [address] [place], [place] [address]      Total time spend on discharging this patient, including the last patient exam, discussing the hospital stay, instructions for ongoing care as it relates to all pertinent caregivers, as well as preparing the medical discharge records, prescriptions, and/or referrals as applicable, is 40 minutes.    Enzo Bi, MD  [facility] 10/27/2019, 7:10 PM  If 7PM-7AM, please contact night-coverage

## 2019-10-21 NOTE — Progress Notes (Signed)
Progress Note  Patient Name: Tony Alvarez Date of Encounter: 10/21/2019  Primary Cardiologist: Shelva Majestic, MD   Subjective   Feeling well this morning.  Said his breathing is better than yesterday.  He remains on 2 L of oxygen.  He does not use oxygen at home.  Inpatient Medications    Scheduled Meds: . ARIPiprazole  15 mg Oral QHS  . aspirin  81 mg Oral Daily  . atorvastatin  80 mg Oral q1800  . buPROPion  300 mg Oral Q breakfast   And  . buPROPion  150 mg Oral QPC lunch  . chlorhexidine  15 mL Mouth Rinse BID  . Chlorhexidine Gluconate Cloth  6 each Topical Daily  . clopidogrel  75 mg Oral Daily  . folic acid  1 mg Oral Daily  . furosemide  40 mg Oral Daily  . guaiFENesin  1,200 mg Oral BID  . levothyroxine  125 mcg Oral Q0600  . mouth rinse  15 mL Mouth Rinse q12n4p  . multivitamin with minerals  1 tablet Oral q morning - 10a  . pantoprazole  40 mg Oral Daily  . potassium chloride  40 mEq Oral BID  . QUEtiapine  100 mg Oral QHS  . sodium chloride flush  3 mL Intravenous Q12H  . spironolactone  12.5 mg Oral Daily  . thiamine  100 mg Oral Daily   Continuous Infusions: . sodium chloride     PRN Meds: sodium chloride, acetaminophen **OR** acetaminophen, albuterol, bisacodyl, nitroGLYCERIN, ondansetron **OR** ondansetron (ZOFRAN) IV, polyethylene glycol, sodium chloride flush, traMADol   Vital Signs    Vitals:   10/21/19 0300 10/21/19 0400 10/21/19 0500 10/21/19 0600  BP: 97/77 106/75 96/69 102/84  Pulse: 94 92 90 96  Resp:  17 (!) 27 (!) 33  Temp:      TempSrc:      SpO2: 90% 93% 93% 96%  Weight:      Height:        Intake/Output Summary (Last 24 hours) at 10/21/2019 0720 Last data filed at 10/20/2019 1300 Gross per 24 hour  Intake 600 ml  Output 450 ml  Net 150 ml   Filed Weights   10/18/19 0003 10/18/19 0500 10/19/19 0500  Weight: 104.5 kg 104.5 kg 104.9 kg    Telemetry    Sinus rhythm, 90 bpm range- Personally Reviewed  Physical Exam    GEN: No acute distress.   Neck: No JVD Cardiac: RRR, no murmurs, rubs, or gallops.  Respiratory:  Rales one quarter up bilaterally. GI: Soft, nontender, non-distended  MS: No edema; No deformity. Neuro:  Nonfocal  Psych: Normal affect   Labs    Chemistry Recent Labs  Lab 10/17/19 1847 10/18/19 0105 10/19/19 0409 10/20/19 1356  NA 141 141 141 141  K 3.3* 3.2* 3.4* 4.3  CL 108 107 106 107  CO2 17* 24 25 25   GLUCOSE 232* 146* 114* 94  BUN 19 19 28* 27*  CREATININE 1.64* 1.43* 1.41* 1.23  CALCIUM 9.4 9.1 9.1 9.5  PROT 7.7  --   --   --   ALBUMIN 4.4  --   --   --   AST 22  --   --   --   ALT 19  --   --   --   ALKPHOS 82  --   --   --   BILITOT 1.2  --   --   --   GFRNONAA 44* 52* 53* >60  GFRAA 52* >60 >  60 >60  ANIONGAP 16* 10 10 9      Hematology Recent Labs  Lab 10/18/19 0105 10/19/19 0409 10/20/19 1356  WBC 7.3 10.1 8.0  RBC 4.32 3.69* 4.03*  HGB 13.5 11.4* 12.5*  HCT 41.3 35.3* 39.2  MCV 95.6 95.7 97.3  MCH 31.3 30.9 31.0  MCHC 32.7 32.3 31.9  RDW 16.5* 16.7* 16.7*  PLT 189 179 189    Cardiac EnzymesNo results for input(s): TROPONINI in the last 168 hours. No results for input(s): TROPIPOC in the last 168 hours.   BNP Recent Labs  Lab 10/17/19 1847  BNP 1,622.0*     DDimer No results for input(s): DDIMER in the last 168 hours.   Radiology    No results found.  Cardiac Studies   Relevant CV Studies:  Cardiac Catheterization: 01/2019  Prox RCA lesion is 20% stenosed.  Prox Cx lesion is 20% stenosed.  Prox LAD-1 lesion is 100% stenosed.  Post intervention, there is a 0% residual stenosis.  Prox LAD-2 lesion is 20% stenosed.  Mid LAD lesion is 40% stenosed.  LV end diastolic pressure is mildly elevated.  There is moderate left ventricular systolic dysfunction.  A stent was successfully placed.  Acute anterior ST segment elevation myocardial infarction secondary to total occlusion of the proximal LAD immediately after a  large dilated aneurysmal segment in the proximal LAD.  Almost a common ostium left main with immediate trifurcation into the LAD, small ramus intermediate vessel and moderate size circumflex vessel. The ramus vessel is normal. The circumflex vessel has 20% proximal narrowing and gave rise to 2 marginal branches. The RCA is a dominant vessel with moderate luminal irregularity with 20% narrowing proximally 40% mid stenosis and 30% narrowing proximal to the acute margin.  Probable moderate acute LV dysfunction with hypocontractility involving the anterolateral wall and apex. LVEDP 22 mm.  Successful PCI to the totally occluded LAD with PTCA and ultimate insertion of a 3.0 x 22 mm Resolute Onyx stent postdilated to 3.26 mm with the 100% occlusion being reduced to 0% and TIMI 0 flow improving to TIMI-3 flow. Once reperfusion was established, LAD had 20% mid LAD smooth narrowing and 40% distal smooth stenosis. The LAD wrapped around the apex and supplied the distal third of the inferior wall.  RECOMMENDATION: DAPT for minimum of 1 year. Optimal blood pressure control with ideal blood pressure less than 120/80. High potency statin therapy with target LDL less than 70. Medical therapy for concomitant CAD. A 2D echo Doppler study will be ordered improved LV function assessment.  Echo: 01/2019 IMPRESSIONS   1. The left ventricle has moderately reduced systolic function, with an ejection fraction of 35-40%. There is mildly increased left ventricular wall thickness. 2. Severe akinesis of the left ventricular, mid-apical anteroseptal wall, anterior wall, apical segment and inferoapical wall. 3. Small, fixed thrombus on the anteroapical wall of the left ventricle.   Echocardiogram: 08/2019 IMPRESSIONS   1. Left ventricular ejection fraction, by visual estimation, is 25 to 30%. The left ventricle has severely decreased function. There is no left ventricular hypertrophy. 2. Left  ventricular diastolic Doppler parameters are consistent with pseudonormalization pattern of LV diastolic filling. 3. There is akinesis of the mid- distal anterioseptal wall, apex and apical lateral regions.  There is spontaneous contrast in apical LV . Definity contrast was used. There is no evidence of apical thrombi. 4. Global right ventricle has normal systolic function.The right ventricular size is normal. No increase in right ventricular wall thickness. 5. Left atrial  size was mildly dilated. 6. Right atrial size was normal. 7. The mitral valve is normal in structure. Moderate mitral valve regurgitation. 8. The tricuspid valve is normal in structure. Tricuspid valve regurgitation is trivial. 9. The aortic valve is normal in structure. Aortic valve regurgitation was not visualized by color flow Doppler. 10. The pulmonic valve was grossly normal. Pulmonic valve regurgitation is not visualized by color flow Doppler. 11. Aortic dilatation noted. 12. There is mild dilatation of the ascending aorta measuring 39 mm. 13. Normal pulmonary artery systolic pressure. 14. The atrial septum is grossly normal.  Patient Profile     63 y.o. male with past medical history of CAD (s/p STEMI in 01/2019 with DES to proximal LAD), chronic combined systolic and diastolic CHF/ICM (EF [id number] by echo in 01/2019, at 25% in 05/2019 and 25-30% by echo in 08/2019), HTN, HLD, Stage 3 CKD, tobacco use and OSAwho is being seen today for the evaluation ofCHFat the request ofDr. Manuella Ghazi.  Assessment & Plan    1. Acute on Chronic Combined Systolic and Diastolic CHF Exacerbation -No significant recorded output in last 24 hours. He has a known reduced EF of 25 to 30% by most recent echocardiogram in 08/2019. By his report, weight had increased by 10 to 15 pounds on his home scales over the past few weeks but he did not start to experience symptoms until12/05/2019. Denies any significant change in his  dietary intake. -BNP was elevated to 1622 on admission and CXR was consistent with CHF.  -BUN up to 28, SCr stable at 1.41 on 10/20/2019.  -He has more crackles today on exam and he remains on oxygen.  I will give 1 dose of IV Lasix 40 mg this morning.  This can be repeated later this afternoon if need be.  I would recommend trying to reattempt oral Lasix on 10/22/2019. -Continue to follow I's and O's along with daily weights. Will need to establish a new baseline weight given his variable readings. -Carvedilol and Entresto have been on hold due to soft blood pressures.  He is on spironolactone 12.5 mg daily.  I would try to resume carvedilol at a minimum at the time of discharge if blood pressures tolerate. -Willneed EP consult as an outpatient for consideration of ICD placement.   2. CAD/Elevated Troponin Values - he iss/p STEMI in 01/2019 with DES to proximal LAD. He denies any recent chest pain. Troponin values have been flat at49 with repeat values of 69, 110 and 128this admission which is consistent with demand ischemia in the setting of an acute CHF exacerbation.  - continueASA,Plavix, and statin therapy.  Carvedilol has been on hold due to soft blood pressures.  3. History of LV Thrombus - was started on Xarelto following admission in 01/2019.He has no active bleeding. No apical thrombus seen with echocardiograms using contrast enhancement both in July 2020 in October 2020. I discontinued Xarelto.  4.HLD - LDL at 59 in 03/2019. Continue Atorvastatin 80mg  daily.   5. Stage 3 CKD - previous baseline of 1.1 - 1.2 but elevated to 1.5 last admission. At 1.41today. I will switch IV Lasix to oral 40 mg daily as BUN has risen to 28.  6. OSA - continued compliance with CPAP encouraged.   7. Tobacco Use - he was previously smoking 1 ppd prior to his MI and has reduced his use to 1-5 cigarettes per day.    For questions or updates, please contact [place] Please consult [email or web address] for contact info under  Cardiology/STEMI.      Signed, Kate Sable, MD  10/21/2019, 7:20 AM

## 2019-10-21 NOTE — ED Provider Notes (Addendum)
[facility] EMERGENCY DEPARTMENT Provider Note   CSN: [id number] Arrival date & time: 10/21/19  1944     History Chief Complaint  Patient presents with   Shortness of Breath    Tony Alvarez is a 63 y.o. male with CAD, CHF EF 35%, COPD who presents with SOB. He was discharged from the hospital today for CHF exacerbation.  He was able to drive home.  He went to eat some burgers with a friend before going home.  When he got home he started to feel short of breath.  The shortness of breath is intermittent and worse with exertion.  He has difficulty remembering exactly what happened but his friend called 911 and he was placed on oxygen and transferred back to the emergency department.  He denies fever, chills, cough, chest pain, syncope, leg swelling.  He states that he did feel well enough to go home earlier today.  He was recommended to have home O2 however he did well with his walking test and therefore was not discharged with any.   HPI     Past Medical History:  Diagnosis Date   Barrett's esophagus 04/21/2018   CAD (coronary artery disease)    a. s/p STEMI in 01/2019 with DES to proximal LAD   CHF (congestive heart failure) ([place])    a. EF 35-40% by echo in 01/2019, at 25-30% by repeat echo in 05/2019 and 08/2019   Depression    High cholesterol    Hypertension    Hypothyroid    Sleep apnea    wears CPAP at home    Patient Active Problem List   Diagnosis Date Noted   CHF exacerbation ([place]) 10/17/2019   Acute and chronic respiratory failure (acute-on-chronic) ([place]) 09/11/2019   Sleep apnea    Hypertension    AKI (acute kidney injury) ([place])    CAD (coronary artery disease) 06/24/2019   Hypothyroid    COPD (chronic obstructive pulmonary disease) ([facility])    Depression    Hyperlipidemia 01/30/2019   LV (left ventricular) mural thrombus with acute MI ([place]) 01/30/2019   Alcohol abuse 01/30/2019   Alcohol withdrawal delirium ([place]) 01/30/2019   Elevated  liver enzymes 01/30/2019   Ischemic cardiomyopathy 01/30/2019   Acute on chronic systolic CHF (congestive heart failure) ([place]) 01/30/2019   History of colonic polyps 04/21/2018   Barrett's esophagus 04/21/2018   Barrett's esophagus without dysplasia 04/21/2018   Rectal bleeding 04/21/2018    Past Surgical History:  Procedure Laterality Date   BIOPSY  05/24/2018   Procedure: BIOPSY;  Surgeon: Rogene Houston, MD;  [facility];  Service: Endoscopy;;  gastric   COLONOSCOPY     COLONOSCOPY N/A 05/24/2018   Procedure: COLONOSCOPY;  Surgeon: Rogene Houston, MD;  Location[facility];  Service: Endoscopy;  Laterality: N/A;  8:55   COLONOSCOPY WITH ESOPHAGOGASTRODUODENOSCOPY (EGD)     CORONARY STENT INTERVENTION N/A 01/26/2019   Procedure: CORONARY STENT INTERVENTION;  Surgeon: Troy Sine, MD;  Location: [place] CV LAB;  Service: Cardiovascular;  Laterality: N/A;   CORONARY/GRAFT ACUTE MI REVASCULARIZATION N/A 01/26/2019   Procedure: Coronary/Graft Acute MI Revascularization;  Surgeon: Troy Sine, MD;  Location: [place] [facility] LAB;  Service: Cardiovascular;  Laterality: N/A;   ESOPHAGOGASTRODUODENOSCOPY N/A 05/24/2018   Procedure: ESOPHAGOGASTRODUODENOSCOPY (EGD);  Surgeon: Rogene Houston, MD;  [facility];  Service: Endoscopy;  Laterality: N/A;   LEFT HEART CATH AND CORONARY ANGIOGRAPHY N/A 01/26/2019   Procedure: LEFT HEART CATH AND  CORONARY ANGIOGRAPHY;  Surgeon: Troy Sine, MD;  Location: [place] [facility] LAB;  Service: Cardiovascular;  Laterality: N/A;   MEDIAL PARTIAL KNEE REPLACEMENT     2015   POLYPECTOMY  05/24/2018   Procedure: POLYPECTOMY;  Surgeon: Rogene Houston, MD;  Location: [facility];  Service: Endoscopy;;  colon   rt hand injury     shoulder surgery for bursitis     TONSILLECTOMY         Family History  Problem Relation Age of Onset   Colon cancer Neg Hx     Social History   Tobacco Use    Smoking status: Current Every Day Smoker    Packs/day: 0.25    Years: 40.00    Pack years: 10.00    Types: Cigarettes   Smokeless tobacco: Never Used   Tobacco comment: 7 cigarettes per day 05/25/19  Substance Use Topics   Alcohol use: Yes    Comment: very rare   Drug use: Never    Home Medications Prior to Admission medications   Medication Sig Start Date End Date Taking? Authorizing Provider  acetaminophen (TYLENOL) 325 MG tablet Take 2 tablets (650 mg total) by mouth every 6 (six) hours as needed for mild pain, fever or headache (or Fever >/= 101). 09/13/19   Roxan Hockey, MD  albuterol (VENTOLIN HFA) 108 (90 Base) MCG/ACT inhaler Inhale 2 puffs into the lungs every 6 (six) hours as needed for wheezing or shortness of breath. 09/13/19   Roxan Hockey, MD  ARIPiprazole (ABILIFY) 15 MG tablet Take 15 mg by mouth at bedtime. 09/01/19   [provider]  aspirin 81 MG chewable tablet Chew 1 tablet (81 mg total) by mouth daily. 10/22/19   Enzo Bi, MD  atorvastatin (LIPITOR) 80 MG tablet Take 1 tablet (80 mg total) by mouth daily at 6 PM. 01/30/19   Almyra Deforest, PA  buPROPion [facility] SR) 150 MG 12 hr tablet Take 150-300 mg by mouth 2 (two) times daily. Take two tablets (300 mg) by mouth every morning and one tablet (150 mg) with lunch 03/27/18   [provider]  carvedilol (COREG) 3.125 MG tablet Take 1 tablet (3.125 mg total) by mouth 2 (two) times daily with a meal. 01/30/19   Almyra Deforest, PA  clopidogrel (PLAVIX) 75 MG tablet Take 75 mg by mouth daily.    [provider]  Eszopiclone 3 MG TABS Take 3 mg by mouth at bedtime. 09/01/19   [provider]  folic acid (FOLVITE) 1 MG tablet Take 1 tablet (1 mg total) by mouth daily. 09/14/19   Roxan Hockey, MD  furosemide (LASIX) 40 MG tablet Take 1 tablet (40 mg total) by mouth 2 (two) times daily. In early morning and early afternoon. 10/21/19 01/19/20  Enzo Bi, MD  guaiFENesin (MUCINEX) 600 MG 12  hr tablet Take 2 tablets (1,200 mg total) by mouth 2 (two) times daily as needed. 10/21/19   Enzo Bi, MD  levothyroxine (SYNTHROID) 125 MCG tablet Take 125 mcg by mouth as needed.    [provider]  Multiple Vitamin (MULTIVITAMIN WITH MINERALS) TABS tablet Take 1 tablet by mouth every morning.    [provider]  nitroGLYCERIN (NITROSTAT) 0.4 MG SL tablet Place 1 tablet (0.4 mg total) under the tongue every 5 (five) minutes x 3 doses as needed for chest pain. 01/30/19   Almyra Deforest, PA  pantoprazole (PROTONIX) 40 MG tablet Take 1 tablet (40 mg total) by mouth daily. 09/13/19 09/12/20  Roxan Hockey, MD  potassium chloride SA (KLOR-CON) 20 MEQ tablet Take 2 tablets (40 mEq total) by mouth daily. 10/21/19 11/20/19  Enzo Bi, MD  QUEtiapine (SEROQUEL) 100 MG tablet Take 50-100 mg by mouth at bedtime.  10/04/19   [provider]  sacubitril-valsartan (ENTRESTO) 49-51 MG Please hold this medication until you follow up with either your primary care doctor or cardiologist, because your blood pressure was on the low side during hospitalization. 10/21/19   Enzo Bi, MD  spironolactone (ALDACTONE) 25 MG tablet Take 0.5 tablets (12.5 mg total) by mouth daily. Patient not taking: Reported on 10/18/2019 08/29/19   Troy Sine, MD  VIIBRYD 20 MG TABS Take 20 mg by mouth daily.  09/05/19   [provider]    Allergies    Patient has no known allergies.  Review of Systems   Review of Systems  Constitutional: Negative for chills and fever.  Respiratory: Positive for shortness of breath. Negative for cough and wheezing.   Cardiovascular: Negative for chest pain and leg swelling.  Gastrointestinal: Negative for abdominal pain.  Neurological: Negative for syncope and headaches.  All other systems reviewed and are negative.   Physical Exam Updated Vital Signs BP 115/69 (BP Location: Left Arm)    Pulse 99    Temp 97.7 F (36.5 C) (Oral)    Resp 18    Ht 5\' 10"  (1.778  m)    Wt 119.7 kg    SpO2 95%    BMI 37.88 kg/m   Physical Exam Vitals and nursing note reviewed.  Constitutional:      General: He is not in acute distress.    Appearance: He is well-developed. He is obese. He is not ill-appearing.     Comments: NAD. Disheveled   HENT:     Head: Normocephalic and atraumatic.  Eyes:     General: No scleral icterus.       Right eye: No discharge.        Left eye: No discharge.     Conjunctiva/sclera: Conjunctivae normal.     Pupils: Pupils are equal, round, and reactive to light.  Cardiovascular:     Rate and Rhythm: Normal rate.  Pulmonary:     Effort: Pulmonary effort is normal. Tachypnea (mild) present. No respiratory distress.     Breath sounds: Normal breath sounds.  Abdominal:     General: There is no distension.  Musculoskeletal:     Cervical back: Normal range of motion.     Right lower leg: No edema.     Left lower leg: No edema.  Skin:    General: Skin is warm and dry.  Neurological:     Mental Status: He is alert and oriented to person, place, and time.  Psychiatric:        Behavior: Behavior normal.     ED Results / Procedures / Treatments   Labs (all labs ordered are listed, but only abnormal results are displayed) Labs Reviewed  BASIC METABOLIC PANEL - Abnormal; Notable for the following components:      Result Value   Glucose, Bld 128 (*)    BUN 27 (*)    All other components within normal limits  BRAIN NATRIURETIC PEPTIDE - Abnormal; Notable for the following components:   B Natriuretic Peptide 523.0 (*)    All other components within normal limits  CBC WITH DIFFERENTIAL/PLATELET - Abnormal; Notable for the following components:   RBC 4.16 (*)    Hemoglobin 12.8 (*)  RDW 16.2 (*)    All other components within normal limits  TROPONIN I (HIGH SENSITIVITY) - Abnormal; Notable for the following components:   Troponin I (High Sensitivity) 63 (*)    All other components within normal limits  TROPONIN I (HIGH  SENSITIVITY) - Abnormal; Notable for the following components:   Troponin I (High Sensitivity) 65 (*)    All other components within normal limits  SARS CORONAVIRUS 2 (TAT 6-24 HRS)    EKG EKG Interpretation  Date/Time:  Friday October 21 2019 20:25:25 EST Ventricular Rate:  97 PR Interval:    QRS Duration: 146 QT Interval:  395 QTC Calculation: 502 R Axis:   -83 Text Interpretation: Sinus rhythm Probable left atrial enlargement IVCD, consider atypical RBBB Left ventricular hypertrophy Similar to prior. No STEMI Confirmed by Nanda Quinton [phone]) on 10/21/2019 8:29:08 PM   Radiology DG Chest 1 View  Result Date: 10/21/2019 CLINICAL DATA:  Shortness of breath EXAM: CHEST  1 VIEW COMPARISON:  10/17/2019 FINDINGS: Stable cardiomegaly. Pulmonary vascular congestion with diffuse interstitial opacities. Mild patchy alveolar opacities have decreased from prior. Probable trace bilateral pleural effusions. No pneumothorax. IMPRESSION: 1. Cardiomegaly with pulmonary vascular congestion and diffuse interstitial edema. 2. Mild patchy alveolar opacities have slightly improved from prior. Electronically Signed   By: Davina Poke M.D.   On: 10/21/2019 21:36    Procedures Procedures (including critical care time)  CRITICAL CARE Performed by: Recardo Evangelist   Total critical care time: 35 minutes  Critical care time was exclusive of separately billable procedures and treating other patients.  Critical care was necessary to treat or prevent imminent or life-threatening deterioration.  Critical care was time spent personally by me on the following activities: development of treatment plan with patient and/or surrogate as well as nursing, discussions with consultants, evaluation of patient's response to treatment, examination of patient, obtaining history from patient or surrogate, ordering and performing treatments and interventions, ordering and review of laboratory studies, ordering and  review of radiographic studies, pulse oximetry and re-evaluation of patient's condition.   Medications Ordered in ED Medications  furosemide (LASIX) injection 80 mg (80 mg Intravenous Given 10/21/19 2211)    ED Course  I have reviewed the triage vital signs and the nursing notes.  Pertinent labs & imaging results that were available during my care of the patient were reviewed by me and considered in my medical decision making (see chart for details).  63 year old male presents with acutely worsening shortness of breath at home just hours after being discharged from the hospital for a CHF exacerbation. Sats were 87% on RA per EMS and he was placed on 4L via [place]. EKG is SR and appears similar to prior. Will obtain repeat labs and CXR  CXR shows cardiomegaly with vascular congestion and diffuse interstitial edema. Appears slightly improved from prior. BNP is improved and is 523 today. He was given a dose of Lasix IV and is having good output. Trop is 83 which is lower than on admission as well. Rest of labs are similar to earlier today. Will discuss with hospitalist.   Discussed with Dr. Darrick Meigs who will admit for ongoing respiratory failure due to CHF exacerbation.  MDM Rules/Calculators/A&P   Final Clinical Impression(s) / ED Diagnoses Final diagnoses:  Acute on chronic congestive heart failure, unspecified heart failure type [facility])    Rx / DC Orders ED Discharge Orders    None       Recardo Evangelist, PA-C 10/22/19 [phone]  Recardo Evangelist, PA-C 10/22/19 0040    Maudie Flakes, MD 10/22/19 [phone]

## 2019-10-22 ENCOUNTER — Other Ambulatory Visit: Payer: Self-pay

## 2019-10-22 DIAGNOSIS — I509 Heart failure, unspecified: Secondary | ICD-10-CM

## 2019-10-22 DIAGNOSIS — I5043 Acute on chronic combined systolic (congestive) and diastolic (congestive) heart failure: Secondary | ICD-10-CM

## 2019-10-22 DIAGNOSIS — R0602 Shortness of breath: Secondary | ICD-10-CM

## 2019-10-22 LAB — CBC
HCT: 38.7 % — ABNORMAL LOW (ref 39.0–52.0)
Hemoglobin: 12.4 g/dL — ABNORMAL LOW (ref 13.0–17.0)
MCH: 31 pg (ref 26.0–34.0)
MCHC: 32 g/dL (ref 30.0–36.0)
MCV: 96.8 fL (ref 80.0–100.0)
Platelets: 191 10*3/uL (ref 150–400)
RBC: 4 MIL/uL — ABNORMAL LOW (ref 4.22–5.81)
RDW: 16.4 % — ABNORMAL HIGH (ref 11.5–15.5)
WBC: 8 10*3/uL (ref 4.0–10.5)
nRBC: 0 % (ref 0.0–0.2)

## 2019-10-22 LAB — TROPONIN I (HIGH SENSITIVITY): Troponin I (High Sensitivity): 65 ng/L — ABNORMAL HIGH (ref <18)

## 2019-10-22 LAB — COMPREHENSIVE METABOLIC PANEL
ALT: 21 U/L (ref 0–44)
AST: 17 U/L (ref 15–41)
Albumin: 3.9 g/dL (ref 3.5–5.0)
Alkaline Phosphatase: 63 U/L (ref 38–126)
Anion gap: 12 (ref 5–15)
BUN: 25 mg/dL — ABNORMAL HIGH (ref 8–23)
CO2: 23 mmol/L (ref 22–32)
Calcium: 9.3 mg/dL (ref 8.9–10.3)
Chloride: 106 mmol/L (ref 98–111)
Creatinine, Ser: 1.25 mg/dL — ABNORMAL HIGH (ref 0.61–1.24)
GFR calc Af Amer: >60 mL/min (ref >60)
GFR calc non Af Amer: >60 mL/min (ref >60)
Glucose, Bld: 101 mg/dL — ABNORMAL HIGH (ref 70–99)
Potassium: 3.8 mmol/L (ref 3.5–5.1)
Sodium: 141 mmol/L (ref 135–145)
Total Bilirubin: 1.5 mg/dL — ABNORMAL HIGH (ref 0.3–1.2)
Total Protein: 6.6 g/dL (ref 6.5–8.1)

## 2019-10-22 LAB — SARS CORONAVIRUS 2 (TAT 6-24 HRS): SARS Coronavirus 2: NEGATIVE

## 2019-10-22 MED ORDER — PANTOPRAZOLE SODIUM 40 MG PO TBEC
40.0000 mg | DELAYED_RELEASE_TABLET | Freq: Every day | ORAL | Status: DC
  Administered 2019-10-22 – 2019-10-23 (×2): 40 mg via ORAL
  Filled 2019-10-22 (×2): qty 1

## 2019-10-22 MED ORDER — ALBUTEROL SULFATE (2.5 MG/3ML) 0.083% IN NEBU: 2.5000 mg | INHALATION_SOLUTION | Freq: Four times a day (QID) | RESPIRATORY_TRACT | Status: DC | PRN

## 2019-10-22 MED ORDER — CLOPIDOGREL BISULFATE 75 MG PO TABS
75.0000 mg | ORAL_TABLET | Freq: Every day | ORAL | Status: DC
  Administered 2019-10-22 – 2019-10-23 (×2): 75 mg via ORAL
  Filled 2019-10-22 (×2): qty 1

## 2019-10-22 MED ORDER — BUPROPION HCL ER (SR) 150 MG PO TB12
300.0000 mg | ORAL_TABLET | Freq: Every day | ORAL | Status: DC
  Administered 2019-10-22 – 2019-10-23 (×2): 300 mg via ORAL
  Filled 2019-10-22 (×2): qty 2

## 2019-10-22 MED ORDER — CARVEDILOL 3.125 MG PO TABS
3.1250 mg | ORAL_TABLET | Freq: Two times a day (BID) | ORAL | Status: DC
  Administered 2019-10-22 – 2019-10-23 (×3): 3.125 mg via ORAL
  Filled 2019-10-22 (×3): qty 1

## 2019-10-22 MED ORDER — ASPIRIN 81 MG PO CHEW
81.0000 mg | CHEWABLE_TABLET | Freq: Every day | ORAL | Status: DC
  Administered 2019-10-22 – 2019-10-23 (×2): 81 mg via ORAL
  Filled 2019-10-22 (×2): qty 1

## 2019-10-22 MED ORDER — ENOXAPARIN SODIUM 40 MG/0.4ML ~~LOC~~ SOLN
40.0000 mg | SUBCUTANEOUS | Status: DC
  Administered 2019-10-22 – 2019-10-23 (×2): 40 mg via SUBCUTANEOUS
  Filled 2019-10-22 (×2): qty 0.4

## 2019-10-22 MED ORDER — BUPROPION HCL ER (SR) 150 MG PO TB12
150.0000 mg | ORAL_TABLET | Freq: Every day | ORAL | Status: DC
  Administered 2019-10-22: 150 mg via ORAL
  Filled 2019-10-22: qty 1

## 2019-10-22 MED ORDER — SODIUM CHLORIDE 0.9% FLUSH
3.0000 mL | Freq: Two times a day (BID) | INTRAVENOUS | Status: DC
  Administered 2019-10-22 – 2019-10-23 (×4): 3 mL via INTRAVENOUS

## 2019-10-22 MED ORDER — ADULT MULTIVITAMIN W/MINERALS CH
1.0000 | ORAL_TABLET | Freq: Every morning | ORAL | Status: DC
  Administered 2019-10-22 – 2019-10-23 (×2): 1 via ORAL
  Filled 2019-10-22 (×2): qty 1

## 2019-10-22 MED ORDER — FUROSEMIDE 40 MG PO TABS
40.0000 mg | ORAL_TABLET | Freq: Two times a day (BID) | ORAL | Status: DC
  Administered 2019-10-22 – 2019-10-23 (×3): 40 mg via ORAL
  Filled 2019-10-22 (×3): qty 1

## 2019-10-22 MED ORDER — QUETIAPINE FUMARATE 25 MG PO TABS
50.0000 mg | ORAL_TABLET | Freq: Every day | ORAL | Status: DC
  Administered 2019-10-22: 22:00:00 100 mg via ORAL
  Filled 2019-10-22: qty 4

## 2019-10-22 MED ORDER — LEVOTHYROXINE SODIUM 25 MCG PO TABS
125.0000 ug | ORAL_TABLET | Freq: Every day | ORAL | Status: DC
  Administered 2019-10-22 – 2019-10-23 (×2): 125 ug via ORAL
  Filled 2019-10-22 (×2): qty 1

## 2019-10-22 MED ORDER — SODIUM CHLORIDE 0.9% FLUSH: 3.0000 mL | INTRAVENOUS | Status: DC | PRN

## 2019-10-22 MED ORDER — POTASSIUM CHLORIDE CRYS ER 20 MEQ PO TBCR
40.0000 meq | EXTENDED_RELEASE_TABLET | Freq: Every day | ORAL | Status: DC
  Administered 2019-10-22 – 2019-10-23 (×2): 40 meq via ORAL
  Filled 2019-10-22 (×2): qty 2

## 2019-10-22 MED ORDER — SODIUM CHLORIDE 0.9 % IV SOLN: 250.0000 mL | INTRAVENOUS | Status: DC | PRN

## 2019-10-22 MED ORDER — ARIPIPRAZOLE 5 MG PO TABS
15.0000 mg | ORAL_TABLET | Freq: Every day | ORAL | Status: DC
  Administered 2019-10-22: 15 mg via ORAL
  Filled 2019-10-22: qty 1

## 2019-10-22 MED ORDER — FOLIC ACID 1 MG PO TABS
1.0000 mg | ORAL_TABLET | Freq: Every day | ORAL | Status: DC
  Administered 2019-10-22 – 2019-10-23 (×2): 1 mg via ORAL
  Filled 2019-10-22 (×2): qty 1

## 2019-10-22 MED ORDER — ATORVASTATIN CALCIUM 40 MG PO TABS
80.0000 mg | ORAL_TABLET | Freq: Every day | ORAL | Status: DC
  Administered 2019-10-22: 17:00:00 80 mg via ORAL
  Filled 2019-10-22: qty 2

## 2019-10-22 MED ORDER — SPIRONOLACTONE 12.5 MG HALF TABLET
12.5000 mg | ORAL_TABLET | Freq: Every day | ORAL | Status: DC
  Administered 2019-10-22 – 2019-10-23 (×2): 12.5 mg via ORAL
  Filled 2019-10-22 (×3): qty 1

## 2019-10-22 MED ORDER — ZOLPIDEM TARTRATE 5 MG PO TABS: 5.0000 mg | ORAL_TABLET | Freq: Every evening | ORAL | Status: DC | PRN

## 2019-10-22 MED ORDER — NITROGLYCERIN 0.4 MG SL SUBL: 0.4000 mg | SUBLINGUAL_TABLET | SUBLINGUAL | Status: DC | PRN

## 2019-10-22 MED ORDER — ACETAMINOPHEN 325 MG PO TABS: 650.0000 mg | ORAL_TABLET | Freq: Four times a day (QID) | ORAL | Status: DC | PRN

## 2019-10-22 MED ORDER — VILAZODONE HCL 20 MG PO TABS
20.0000 mg | ORAL_TABLET | Freq: Every day | ORAL | Status: DC
  Administered 2019-10-22 – 2019-10-23 (×2): 20 mg via ORAL
  Filled 2019-10-22 (×3): qty 1

## 2019-10-22 MED ORDER — BUPROPION HCL ER (SR) 150 MG PO TB12: 150.0000 mg | ORAL_TABLET | Freq: Two times a day (BID) | ORAL | Status: DC

## 2019-10-22 NOTE — H&P (Signed)
TRH H&P    Patient Demographics:    Tony Alvarez, is a 63 y.o. male  MRN: [id number]  DOB - 1956/03/14  Admit Date - 10/21/2019  Referring MD/NP/PA: Janetta Hora  Outpatient Primary MD for the patient is A, [facility]  Patient coming from: Home  Chief complaint-shortness of breath   HPI:    Tony Alvarez  is a 63 y.o. male, with medical history of CAD, ischemic cardiomyopathy, CAD, s/p DES to proximal LAD,, OSA, hypertension, hyperlipidemia, chronic combined systolic and diastolic CHF, stage III CKD, history of LV thrombus, anticoagulation discontinued after no apical thrombus seen in subsequent echocardiograms done in July and October 2020.  Patient was just discharged home today at 5 PM.  Patient states that he ate a burger and fries with a friend before going home.  When he got home he started feeling short of breath.  Shortness of breath was intermittent and worse over the exertion.  His friend called 911 and patient was placed on oxygen and transferred back to the ED. He denies fever or chills. Denies nausea vomiting or diarrhea. Denies chest pain.  In the ED, lab work revealed BNP 523 which is down from 1622 on 10/17/2019.  Patient got Lasix 80 mg IV in the ED.  Currently on 2 L of oxygen via nasal cannula, O2 sats 96%.    Review of systems:    In addition to the HPI above,    All other systems reviewed and are negative.    Past History of the following :    Past Medical History:  Diagnosis Date  . Barrett's esophagus 04/21/2018  . CAD (coronary artery disease)    a. s/p STEMI in 01/2019 with DES to proximal LAD  . CHF (congestive heart failure) ([place])    a. EF 35-40% by echo in 01/2019, at 25-30% by repeat echo in 05/2019 and 08/2019  . Depression   . High cholesterol   . Hypertension   . Hypothyroid   . Sleep apnea    wears CPAP at home      Past Surgical History:    Procedure Laterality Date  . BIOPSY  05/24/2018   Procedure: BIOPSY;  Surgeon: Rogene Houston, MD;  [facility];  Service: Endoscopy;;  gastric  . COLONOSCOPY    . COLONOSCOPY N/A 05/24/2018   Procedure: COLONOSCOPY;  Surgeon: Rogene Houston, MD;  [facility];  Service: Endoscopy;  Laterality: N/A;  8:55  . COLONOSCOPY WITH ESOPHAGOGASTRODUODENOSCOPY (EGD)    . CORONARY STENT INTERVENTION N/A 01/26/2019   Procedure: CORONARY STENT INTERVENTION;  Surgeon: Troy Sine, MD;  Location: [place] [facility];  Service: Cardiovascular;  Laterality: N/A;  . CORONARY/GRAFT ACUTE MI REVASCULARIZATION N/A 01/26/2019   Procedure: Coronary/Graft Acute MI Revascularization;  Surgeon: Troy Sine, MD;  Location: [place] [facility];  Service: Cardiovascular;  Laterality: N/A;  . ESOPHAGOGASTRODUODENOSCOPY N/A 05/24/2018   Procedure: ESOPHAGOGASTRODUODENOSCOPY (EGD);  Surgeon: Rogene Houston, MD;  [facility];  Service: Endoscopy;  Laterality:  N/A;  . LEFT HEART CATH AND CORONARY ANGIOGRAPHY N/A 01/26/2019   Procedure: LEFT HEART CATH AND CORONARY ANGIOGRAPHY;  Surgeon: Troy Sine, MD;  Location: [place] [facility];  Service: Cardiovascular;  Laterality: N/A;  . MEDIAL PARTIAL KNEE REPLACEMENT     2015  . POLYPECTOMY  05/24/2018   Procedure: POLYPECTOMY;  Surgeon: Rogene Houston, MD;  [facility];  Service: Endoscopy;;  colon  . rt hand injury    . shoulder surgery for bursitis    . TONSILLECTOMY        Social History:      Social History   Tobacco Use  . Smoking status: Current Every Day Smoker    Packs/day: 0.25    Years: 40.00    Pack years: 10.00    Types: Cigarettes  . Smokeless tobacco: Never Used  . Tobacco comment: 7 cigarettes per day 05/25/19  Substance Use Topics  . Alcohol use: Yes    Comment: very rare       Family History :     Family History  Problem Relation Age of Onset  . Colon cancer Neg Hx       Home  Medications:   Prior to Admission medications   Medication Sig Start Date End Date Taking? Authorizing Provider  acetaminophen (TYLENOL) 325 MG tablet Take 2 tablets (650 mg total) by mouth every 6 (six) hours as needed for mild pain, fever or headache (or Fever >/= 101). 09/13/19  Yes Emokpae, Courage, MD  albuterol (VENTOLIN HFA) 108 (90 Base) MCG/ACT inhaler Inhale 2 puffs into the lungs every 6 (six) hours as needed for wheezing or shortness of breath. 09/13/19  Yes Emokpae, Courage, MD  ARIPiprazole (ABILIFY) 15 MG tablet Take 15 mg by mouth at bedtime. 09/01/19  Yes [provider]  aspirin 81 MG chewable tablet Chew 1 tablet (81 mg total) by mouth daily. 10/22/19  Yes Enzo Bi, MD  atorvastatin (LIPITOR) 80 MG tablet Take 1 tablet (80 mg total) by mouth daily at 6 PM. 01/30/19  Yes Almyra Deforest, PA  buPROPion [facility] SR) 150 MG 12 hr tablet Take 150-300 mg by mouth 2 (two) times daily. Take two tablets (300 mg) by mouth every morning and one tablet (150 mg) with lunch 03/27/18  Yes [provider]  carvedilol (COREG) 3.125 MG tablet Take 1 tablet (3.125 mg total) by mouth 2 (two) times daily with a meal. 01/30/19  Yes Almyra Deforest, PA  clopidogrel (PLAVIX) 75 MG tablet Take 75 mg by mouth daily.   Yes [provider]  Eszopiclone 3 MG TABS Take 3 mg by mouth at bedtime. 09/01/19  Yes [provider]  folic acid (FOLVITE) 1 MG tablet Take 1 tablet (1 mg total) by mouth daily. 09/14/19  Yes Emokpae, Courage, MD  furosemide (LASIX) 40 MG tablet Take 1 tablet (40 mg total) by mouth 2 (two) times daily. In early morning and early afternoon. 10/21/19 01/19/20 Yes Enzo Bi, MD  guaiFENesin (MUCINEX) 600 MG 12 hr tablet Take 2 tablets (1,200 mg total) by mouth 2 (two) times daily as needed. 10/21/19  Yes Enzo Bi, MD  levothyroxine (SYNTHROID) 125 MCG tablet Take 125 mcg by mouth as needed.   Yes [provider]  Multiple Vitamin (MULTIVITAMIN WITH MINERALS)  TABS tablet Take 1 tablet by mouth every morning.   Yes [provider]  nitroGLYCERIN (NITROSTAT) 0.4 MG SL tablet Place 1 tablet (0.4 mg total) under the tongue every 5 (five)  minutes x 3 doses as needed for chest pain. 01/30/19  Yes Almyra Deforest, PA  pantoprazole (PROTONIX) 40 MG tablet Take 1 tablet (40 mg total) by mouth daily. 09/13/19 09/12/20 Yes Emokpae, Courage, MD  potassium chloride SA (KLOR-CON) 20 MEQ tablet Take 2 tablets (40 mEq total) by mouth daily. 10/21/19 11/20/19 Yes Enzo Bi, MD  QUEtiapine (SEROQUEL) 100 MG tablet Take 50-100 mg by mouth at bedtime.  10/04/19  Yes [provider]  VIIBRYD 20 MG TABS Take 20 mg by mouth daily.  09/05/19  Yes [provider]  sacubitril-valsartan (ENTRESTO) 49-51 MG Please hold this medication until you follow up with either your primary care doctor or cardiologist, because your blood pressure was on the low side during hospitalization. Patient not taking: Reported on 10/21/2019 10/21/19   Enzo Bi, MD  spironolactone (ALDACTONE) 25 MG tablet Take 0.5 tablets (12.5 mg total) by mouth daily. Patient not taking: Reported on 10/18/2019 08/29/19   Troy Sine, MD     Allergies:    No Known Allergies   Physical Exam:   Vitals  Blood pressure 104/63, pulse 87, temperature 98.1 F (36.7 C), temperature source Oral, resp. rate 18, height 5\' 10"  (1.778 m), weight 105.6 kg, SpO2 96 %.  1.  General: Appears in no acute distress  2. Psychiatric: Alert, oriented x3, intact insight and judgment, normal mood and affect  3. Neurologic: Cranial nerves II through XII grossly intact, no focal deficit noted  4. HEENMT:  Atraumatic normocephalic, extraocular muscles are intact  5. Respiratory : Bibasilar crackles auscultated  6. Cardiovascular : S1-S2, regular, no murmur auscultated, no edema in the lower extremities  7. Gastrointestinal:  Abdomen is soft, nontender, no organomegaly      Data Review:     CBC Recent Labs  Lab 10/17/19 1847 10/18/19 0105 10/19/19 0409 10/20/19 1356 10/21/19 2219  WBC 11.0* 7.3 10.1 8.0 9.7  HGB 14.6 13.5 11.4* 12.5* 12.8*  HCT 45.7 41.3 35.3* 39.2 40.1  PLT 255 189 179 189 198  MCV 96.6 95.6 95.7 97.3 96.4  MCH 30.9 31.3 30.9 31.0 30.8  MCHC 31.9 32.7 32.3 31.9 31.9  RDW 16.7* 16.5* 16.7* 16.7* 16.2*  LYMPHSABS 3.4  --   --   --  1.7  MONOABS 0.6  --   --   --  0.5  EOSABS 0.3  --   --   --  0.2  BASOSABS 0.1  --   --   --  0.1   ------------------------------------------------------------------------------------------------------------------  Results for orders placed or performed during the hospital encounter of 10/21/19 (from the past 48 hour(s))  Basic metabolic panel     Status: Abnormal   Collection Time: 10/21/19 10:19 PM  Result Value Ref Range   Sodium 139 135 - 145 mmol/L   Potassium 4.2 3.5 - 5.1 mmol/L   Chloride 106 98 - 111 mmol/L   CO2 24 22 - 32 mmol/L   Glucose, Bld 128 (H) 70 - 99 mg/dL   BUN 27 (H) 8 - 23 mg/dL   Creatinine, Ser 1.23 0.61 - 1.24 mg/dL   Calcium 9.6 8.9 - 10.3 mg/dL   GFR calc non Af Amer >60 >60 mL/min   GFR calc Af Amer >60 >60 mL/min   Anion gap 9 5 - 15    Comment: Performed at [facility], [address] [place],  [address]  CBC with Differential     Status: Abnormal   Collection Time: 10/21/19 10:19 PM  Result  Value Ref Range   WBC 9.7 4.0 - 10.5 K/uL   RBC 4.16 (L) 4.22 - 5.81 MIL/uL   Hemoglobin 12.8 (L) 13.0 - 17.0 g/dL   HCT 40.1 39.0 - 52.0 %   MCV 96.4 80.0 - 100.0 fL   MCH 30.8 26.0 - 34.0 pg   MCHC 31.9 30.0 - 36.0 g/dL   RDW 16.2 (H) 11.5 - 15.5 %   Platelets 198 150 - 400 K/uL   nRBC 0.0 0.0 - 0.2 %   Neutrophils Relative % 74 %   Neutro Abs 7.2 1.7 - 7.7 K/uL   Lymphocytes Relative 18 %   Lymphs Abs 1.7 0.7 - 4.0 K/uL   Monocytes Relative 5 %   Monocytes Absolute 0.5 0.1 - 1.0 K/uL   Eosinophils Relative 2 %   Eosinophils Absolute 0.2 0.0 - 0.5 K/uL   Basophils  Relative 1 %   Basophils Absolute 0.1 0.0 - 0.1 K/uL   Immature Granulocytes 0 %   Abs Immature Granulocytes 0.04 0.00 - 0.07 K/uL    Comment: Performed at [facility], [address], [place], [place] [address]  Troponin I (High Sensitivity)     Status: Abnormal   Collection Time: 10/21/19 10:19 PM  Result Value Ref Range   Troponin I (High Sensitivity) 63 (H) <18 ng/L    Comment: (NOTE) Elevated high sensitivity troponin I (hsTnI) values and significant  changes across serial measurements may suggest ACS but many other  chronic and acute conditions are known to elevate hsTnI results.  Refer to the "Links" section for chest pain algorithms and additional  guidance. Performed at [facility], [address], [place], [place] [address]   Brain natriuretic peptide     Status: Abnormal   Collection Time: 10/21/19 10:20 PM  Result Value Ref Range   B Natriuretic Peptide 523.0 (H) 0.0 - 100.0 pg/mL    Comment: Performed at [facility], [address], [place], [place] [address]  Troponin I (High Sensitivity)     Status: Abnormal   Collection Time: 10/21/19 11:43 PM  Result Value Ref Range   Troponin I (High Sensitivity) 65 (H) <18 ng/L    Comment: (NOTE) Elevated high sensitivity troponin I (hsTnI) values and significant  changes across serial measurements may suggest ACS but many other  chronic and acute conditions are known to elevate hsTnI results.  Refer to the "Links" section for chest pain algorithms and additional  guidance. Performed at [facility], [address], [place], [place] [address]     Chemistries  Recent Labs  Lab 10/17/19 1847 10/18/19 0105 10/19/19 0409 10/20/19 1356 10/21/19 2219  NA 141 141 141 141 139  K 3.3* 3.2* 3.4* 4.3 4.2  CL 108 107 106 107 106  CO2 17* 24 25 25 24   GLUCOSE 232* 146* 114* 94 128*  BUN 19 19 28* 27* 27*  CREATININE 1.64* 1.43* 1.41* 1.23 1.23  CALCIUM 9.4 9.1 9.1 9.5 9.6  MG  --   --  2.1 2.3  --   AST 22  --   --   --   --    ALT 19  --   --   --   --   ALKPHOS 82  --   --   --   --   BILITOT 1.2  --   --   --   --    ------------------------------------------------------------------------------------------------------------------  ------------------------------------------------------------------------------------------------------------------ GFR: Estimated Creatinine Clearance: 76.7 mL/min (by C-G formula based on SCr of 1.23  mg/dL). Liver Function Tests: Recent Labs  Lab 10/17/19 1847  AST 22  ALT 19  ALKPHOS 82  BILITOT 1.2  PROT 7.7  ALBUMIN 4.4   No results for input(s): LIPASE, AMYLASE in the last 168 hours. No results for input(s): AMMONIA in the last 168 hours. Coagulation Profile: No results for input(s): INR, PROTIME in the last 168 hours. Cardiac Enzymes: No results for input(s): CKTOTAL, CKMB, CKMBINDEX, TROPONINI in the last 168 hours. BNP (last 3 results) Recent Labs    07/26/19 1102  PROBNP 992*   HbA1C: No results for input(s): HGBA1C in the last 72 hours. CBG: No results for input(s): GLUCAP in the last 168 hours. Lipid Profile: No results for input(s): CHOL, HDL, LDLCALC, TRIG, CHOLHDL, LDLDIRECT in the last 72 hours. Thyroid Function Tests: No results for input(s): TSH, T4TOTAL, FREET4, T3FREE, THYROIDAB in the last 72 hours. Anemia Panel: No results for input(s): VITAMINB12, FOLATE, FERRITIN, TIBC, IRON, RETICCTPCT in the last 72 hours.  --------------------------------------------------------------------------------------------------------------- Urine analysis: No results found for: COLORURINE, APPEARANCEUR, LABSPEC, PHURINE, GLUCOSEU, HGBUR, BILIRUBINUR, KETONESUR, PROTEINUR, UROBILINOGEN, NITRITE, LEUKOCYTESUR    Imaging Results:    DG Chest 1 View  Result Date: 10/21/2019 CLINICAL DATA:  Shortness of breath EXAM: CHEST  1 VIEW COMPARISON:  10/17/2019 FINDINGS: Stable cardiomegaly. Pulmonary vascular congestion with diffuse interstitial opacities. Mild  patchy alveolar opacities have decreased from prior. Probable trace bilateral pleural effusions. No pneumothorax. IMPRESSION: 1. Cardiomegaly with pulmonary vascular congestion and diffuse interstitial edema. 2. Mild patchy alveolar opacities have slightly improved from prior. Electronically Signed   By: Davina Poke M.D.   On: 10/21/2019 21:36    My personal review of EKG: Rhythm NSR, no ST changes noted   Assessment & Plan:    Active Problems:   Acute exacerbation of CHF (congestive heart failure) ([place])   1. Acute exacerbation of combined systolic and diastolic CHF-patient received Lasix 80 mg IV in the ED, diuresing very well.  We will continue with a home regimen of Lasix 40 mg p.o. twice daily from tomorrow morning.  Strict intake and output, daily weights.    Consider O2 evaluation before discharge.  Patient might need oxygen at home.  Continue Coreg.  Delene Loll was on hold before discharge, patient did not take Entresto as he came back in 3 hours.  2. History of LV thrombus-patient was on Xarelto which has been discontinued by cardiology as echocardiogram in July 2020 in October 2020 did not show apical thrombus.  3. Stage III CKD-creatinine 1.23, at baseline.  Follow BMP in a.m.  4. Hyperlipidemia-continue atorvastatin.  5. Obstructive sleep apnea-continue CPAP.  6. Depression-continue Seroquel, bupropion.  7. Hypothyroidism-continue levothyroxine   DVT Prophylaxis-   Lovenox   AM Labs Ordered, also please review Full Orders  Family Communication: Admission, patients condition and plan of care including tests being ordered have been discussed with the patient  who indicate understanding and agree with the plan and Code Status.  Code Status: Full code  Admision status: Inpatient: Based on patients clinical presentation and evaluation of above clinical data, I have made determination that patient meets Inpatient criteria at this time.  Time spent in minutes : 60  minutes   Maimouna Rondeau S Cassidee Deats M.D

## 2019-10-22 NOTE — Progress Notes (Signed)
PROGRESS NOTE    Tony Alvarez  [id number] DOB: 04-24-1956 DOA: 10/21/2019 PCP: [facility]    Assessment & Plan:   Active Problems:   Acute exacerbation of CHF (congestive heart failure) ([place])   Tony Alvarez  is a 63 y.o. male, with medical history of CAD, ischemic cardiomyopathy, CAD, s/p DES to proximal LAD,, OSA, hypertension, hyperlipidemia, chronic combined systolic and diastolic CHF, stage III CKD, LV thrombus resolved and not on anticoagulation who presented to the ED for worsening dyspnea after just being discharged 3 hours ago.   # Acute exacerbation of combined systolic and diastolic CHF --2D echocardiogram 08/17/2019 shows LVEF 25-30% --Was just discharged 3 hours prior after not qualifying for suppl O2 (saturation remained above 90% with walking).  Ate a burger with a can of soda at [place] right after discharge. -patient received Lasix 80 mg IV in the ED, diuresing very well.   --continue Lasix 40 mg p.o. twice daily  --Strict intake and output, daily weights. --Repeat O2 evaluation with ambulation tomorrow --Continue Coreg and Aldactone --continue to hold Entresto due to low BP --EP for ICD as outpatient  # History of LV thrombus, resolved -patient was on Xarelto which has been discontinued by cardiology as echocardiogram in July 2020 in October 2020 did not show apical thrombus.  # Stage III CKD-creatinine 1.23, at baseline.   # Hyperlipidemia-continue atorvastatin.  # Obstructive sleep apnea-continue CPAP.  # Depression-continue Seroquel, bupropion and abilify  # Hypothyroidism-continue levothyroxine  # Tobacco abuse  # Hx of hypokalemia --due to diuretic --continue home potassium 40 mg daily   DVT prophylaxis: Lovenox SQ Code Status: Full code  Family Communication: None  Disposition Plan: tomorrow    Subjective and Interval History:  Pt reported putting out a lot of urine with IV lasix received in the ED, though  not charted.  Breathing improved.  No fever, cough, chest pain, abdominal pain, N/V/D, dysuria, increased swelling.   Objective: Vitals:   10/22/19 1349 10/22/19 2109 10/23/19 0500 10/23/19 0550  BP: 107/79 104/63  99/64  Pulse: 96 97  84  Resp: 20 18  18   Temp: 98.6 F (37 C) (!) 97.5 F (36.4 C)  98.8 F (37.1 C)  TempSrc: Oral Oral  Oral  SpO2: 98% 98%  96%  Weight:   104.6 kg   Height:        Intake/Output Summary (Last 24 hours) at 10/23/2019 0812 Last data filed at 10/23/2019 0547 Gross per 24 hour  Intake 840 ml  Output 600 ml  Net 240 ml   Filed Weights   10/21/19 1959 10/22/19 0135 10/23/19 0500  Weight: 119.7 kg 105.6 kg 104.6 kg    Examination:  Constitutional: NAD, AAOx3 HEENT: conjunctivae and lids normal, EOMI CV: RRR no M,R,G. Distal pulses +2.  No cyanosis.   RESP: CTA B/L, normal respiratory effort, on 3L GI: +BS, NTND Extremities: No effusions, edema, or tenderness in BLE MSK: normal ROM and strength, no joint enlargement or tenderness of both UE and LE SKIN: warm, dry and intact Neuro: II - XII grossly intact.  Sensation intact Psych: Normal mood and affect.  Appropriate judgement and reason   Data Reviewed: I have personally reviewed following labs and imaging studies  CBC: Recent Labs  Lab 10/17/19 1847 10/18/19 0105 10/19/19 0409 10/20/19 1356 10/21/19 2219 10/22/19 0612  WBC 11.0* 7.3 10.1 8.0 9.7 8.0  NEUTROABS 6.7  --   --   --  7.2  --  HGB 14.6 13.5 11.4* 12.5* 12.8* 12.4*  HCT 45.7 41.3 35.3* 39.2 40.1 38.7*  MCV 96.6 95.6 95.7 97.3 96.4 96.8  PLT 255 189 179 189 198 [id number]   Basic Metabolic Panel: Recent Labs  Lab 10/18/19 0105 10/19/19 0409 10/20/19 1356 10/21/19 2219 10/22/19 0612  NA 141 141 141 139 141  K 3.2* 3.4* 4.3 4.2 3.8  CL 107 106 107 106 106  CO2 24 25 25 24 23   GLUCOSE 146* 114* 94 128* 101*  BUN 19 28* 27* 27* 25*  CREATININE 1.43* 1.41* 1.23 1.23 1.25*  CALCIUM 9.1 9.1 9.5 9.6 9.3  MG  --  2.1  2.3  --   --    GFR: Estimated Creatinine Clearance: 75.1 mL/min (A) (by C-G formula based on SCr of 1.25 mg/dL (H)). Liver Function Tests: Recent Labs  Lab 10/17/19 1847 10/22/19 0612  AST 22 17  ALT 19 21  ALKPHOS 82 63  BILITOT 1.2 1.5*  PROT 7.7 6.6  ALBUMIN 4.4 3.9   No results for input(s): LIPASE, AMYLASE in the last 168 hours. No results for input(s): AMMONIA in the last 168 hours. Coagulation Profile: No results for input(s): INR, PROTIME in the last 168 hours. Cardiac Enzymes: No results for input(s): CKTOTAL, CKMB, CKMBINDEX, TROPONINI in the last 168 hours. BNP (last 3 results) Recent Labs    07/26/19 1102  PROBNP 992*   HbA1C: No results for input(s): HGBA1C in the last 72 hours. CBG: No results for input(s): GLUCAP in the last 168 hours. Lipid Profile: No results for input(s): CHOL, HDL, LDLCALC, TRIG, CHOLHDL, LDLDIRECT in the last 72 hours. Thyroid Function Tests: No results for input(s): TSH, T4TOTAL, FREET4, T3FREE, THYROIDAB in the last 72 hours. Anemia Panel: No results for input(s): VITAMINB12, FOLATE, FERRITIN, TIBC, IRON, RETICCTPCT in the last 72 hours. Sepsis Labs: No results for input(s): PROCALCITON, LATICACIDVEN in the last 168 hours.  Recent Results (from the past 240 hour(s))  Respiratory Panel by RT PCR (Flu A&B, Covid) - Nasopharyngeal Swab     Status: None   Collection Time: 10/17/19  9:28 PM   Specimen: Nasopharyngeal Swab  Result Value Ref Range Status   SARS Coronavirus 2 by RT PCR NEGATIVE NEGATIVE Final    Comment: (NOTE) SARS-CoV-2 target nucleic acids are NOT DETECTED. The SARS-CoV-2 RNA is generally detectable in upper respiratoy specimens during the acute phase of infection. The lowest concentration of SARS-CoV-2 viral copies this assay can detect is 131 copies/mL. A negative result does not preclude SARS-Cov-2 infection and should not be used as the sole basis for treatment or other patient management decisions. A  negative result may occur with  improper specimen collection/handling, submission of specimen other than nasopharyngeal swab, presence of viral mutation(s) within the areas targeted by this assay, and inadequate number of viral copies (<131 copies/mL). A negative result must be combined with clinical observations, patient history, and epidemiological information. The expected result is Negative. Fact Sheet for Patients:  PinkCheek.be Fact Sheet for Healthcare Providers:  GravelBags.it This test is not yet ap proved or cleared by the [place] FDA and  has been authorized for detection and/or diagnosis of SARS-CoV-2 by FDA under an Emergency Use Authorization (EUA). This EUA will remain  in effect (meaning this test can be used) for the duration of the COVID-19 declaration under Section 564(b)(1) of the Act, 21 U.S.C. section 360bbb-3(b)(1), unless the authorization is terminated or revoked sooner.    Influenza A by PCR NEGATIVE NEGATIVE Final  Influenza B by PCR NEGATIVE NEGATIVE Final    Comment: (NOTE) The Xpert Xpress SARS-CoV-2/FLU/RSV assay is intended as an aid in  the diagnosis of influenza from Nasopharyngeal swab specimens and  should not be used as a sole basis for treatment. Nasal washings and  aspirates are unacceptable for Xpert Xpress SARS-CoV-2/FLU/RSV  testing. Fact Sheet for Patients: PinkCheek.be Fact Sheet for Healthcare Providers: GravelBags.it This test is not yet approved or cleared by the [place] FDA and  has been authorized for detection and/or diagnosis of SARS-CoV-2 by  FDA under an Emergency Use Authorization (EUA). This EUA will remain  in effect (meaning this test can be used) for the duration of the  Covid-19 declaration under Section 564(b)(1) of the Act, 21  U.S.C. section 360bbb-3(b)(1), unless the authorization is    terminated or revoked. Performed at [facility], [address] [place], [place] [address]   MRSA PCR Screening     Status: None   Collection Time: 10/17/19 11:56 PM   Specimen: Nasal Mucosa; Nasopharyngeal  Result Value Ref Range Status   MRSA by PCR NEGATIVE NEGATIVE Final    Comment:        The GeneXpert MRSA Assay (FDA approved for NASAL specimens only), is one component of a comprehensive MRSA colonization surveillance program. It is not intended to diagnose MRSA infection nor to guide or monitor treatment for MRSA infections. Performed at [facility], [address] [place], [place] [address]   SARS CORONAVIRUS 2 (TAT 6-24 HRS) Nasopharyngeal Nasopharyngeal Swab     Status: None   Collection Time: 10/22/19 12:35 AM   Specimen: Nasopharyngeal Swab  Result Value Ref Range Status   SARS Coronavirus 2 NEGATIVE NEGATIVE Final    Comment: (NOTE) SARS-CoV-2 target nucleic acids are NOT DETECTED. The SARS-CoV-2 RNA is generally detectable in upper and lower respiratory specimens during the acute phase of infection. Negative results do not preclude SARS-CoV-2 infection, do not rule out co-infections with other pathogens, and should not be used as the sole basis for treatment or other patient management decisions. Negative results must be combined with clinical observations, patient history, and epidemiological information. The expected result is Negative. Fact Sheet for Patients: SugarRoll.be Fact Sheet for Healthcare Providers: https://www.woods-mathews.com/ This test is not yet approved or cleared by the [place] FDA and  has been authorized for detection and/or diagnosis of SARS-CoV-2 by FDA under an Emergency Use Authorization (EUA). This EUA will remain  in effect (meaning this test can be used) for the duration of the COVID-19 declaration under Section 56 4(b)(1) of the Act, 21 U.S.C. section 360bbb-3(b)(1), unless the  authorization is terminated or revoked sooner. Performed at [facility], [place] [address], [place], [place] [address]         Radiology Studies: DG Chest 1 View  Result Date: 10/21/2019 CLINICAL DATA:  Shortness of breath EXAM: CHEST  1 VIEW COMPARISON:  10/17/2019 FINDINGS: Stable cardiomegaly. Pulmonary vascular congestion with diffuse interstitial opacities. Mild patchy alveolar opacities have decreased from prior. Probable trace bilateral pleural effusions. No pneumothorax. IMPRESSION: 1. Cardiomegaly with pulmonary vascular congestion and diffuse interstitial edema. 2. Mild patchy alveolar opacities have slightly improved from prior. Electronically Signed   By: Davina Poke M.D.   On: 10/21/2019 21:36       Scheduled Meds: . ARIPiprazole  15 mg Oral QHS  . aspirin  81 mg Oral Daily  . atorvastatin  80 mg Oral q1800  . buPROPion  150 mg Oral Q  supper  . buPROPion  300 mg Oral Daily  . carvedilol  3.125 mg Oral BID WC  . clopidogrel  75 mg Oral Daily  . enoxaparin (LOVENOX) injection  40 mg Subcutaneous Q24H  . folic acid  1 mg Oral Daily  . furosemide  40 mg Oral BID  . levothyroxine  125 mcg Oral Q0600  . multivitamin with minerals  1 tablet Oral q morning - 10a  . pantoprazole  40 mg Oral Daily  . potassium chloride SA  40 mEq Oral Daily  . QUEtiapine  50-100 mg Oral QHS  . sodium chloride flush  3 mL Intravenous Q12H  . spironolactone  12.5 mg Oral Daily  . Vilazodone HCl  20 mg Oral Daily   Continuous Infusions: . sodium chloride       LOS: 1 day      Enzo Bi, MD [facility] If 7PM-7AM, please contact night-coverage 10/23/2019, 8:12 AM

## 2019-10-22 NOTE — Progress Notes (Signed)
Patient ambulated to bathroom, one unmeasured urine occurrence.  Patient educated to use urinal from now on to measure accurate output.

## 2019-10-23 MED ORDER — ENTRESTO 49-51 MG PO TABS: ORAL_TABLET | ORAL | 6 refills | Status: DC

## 2019-10-23 NOTE — Progress Notes (Signed)
Nsg Discharge Note  Admit Date:  10/21/2019 Discharge date: 10/23/2019   Rage Nabb to be D/C'd Home per MD order.  AVS completed.  Copy for chart, and copy for patient signed, and dated. Patient/caregiver able to verbalize understanding.  Discharge Medication: Allergies as of 10/23/2019   No Known Allergies     Medication List    TAKE these medications   acetaminophen 325 MG tablet Commonly known as: TYLENOL Take 2 tablets (650 mg total) by mouth every 6 (six) hours as needed for mild pain, fever or headache (or Fever >/= 101).   albuterol 108 (90 Base) MCG/ACT inhaler Commonly known as: VENTOLIN HFA Inhale 2 puffs into the lungs every 6 (six) hours as needed for wheezing or shortness of breath.   ARIPiprazole 15 MG tablet Commonly known as: ABILIFY Take 15 mg by mouth at bedtime.   aspirin 81 MG chewable tablet Chew 1 tablet (81 mg total) by mouth daily.   atorvastatin 80 MG tablet Commonly known as: LIPITOR Take 1 tablet (80 mg total) by mouth daily at 6 PM.   buPROPion 150 MG 12 hr tablet Commonly known as: WELLBUTRIN SR Take 150-300 mg by mouth 2 (two) times daily. Take two tablets (300 mg) by mouth every morning and one tablet (150 mg) with lunch   carvedilol 3.125 MG tablet Commonly known as: COREG Take 1 tablet (3.125 mg total) by mouth 2 (two) times daily with a meal.   clopidogrel 75 MG tablet Commonly known as: PLAVIX Take 75 mg by mouth daily.   Entresto 49-51 MG Generic drug: sacubitril-valsartan Please hold this medication until you follow up with either your primary care doctor or cardiologist, because your blood pressure was on the low side during hospitalization.   Eszopiclone 3 MG Tabs Take 3 mg by mouth at bedtime.   folic acid 1 MG tablet Commonly known as: FOLVITE Take 1 tablet (1 mg total) by mouth daily.   furosemide 40 MG tablet Commonly known as: LASIX Take 1 tablet (40 mg total) by mouth 2 (two) times daily. In early morning and  early afternoon.   guaiFENesin 600 MG 12 hr tablet Commonly known as: MUCINEX Take 2 tablets (1,200 mg total) by mouth 2 (two) times daily as needed.   levothyroxine 125 MCG tablet Commonly known as: SYNTHROID Take 125 mcg by mouth as needed.   multivitamin with minerals Tabs tablet Take 1 tablet by mouth every morning.   nitroGLYCERIN 0.4 MG SL tablet Commonly known as: NITROSTAT Place 1 tablet (0.4 mg total) under the tongue every 5 (five) minutes x 3 doses as needed for chest pain.   pantoprazole 40 MG tablet Commonly known as: Protonix Take 1 tablet (40 mg total) by mouth daily.   potassium chloride SA 20 MEQ tablet Commonly known as: KLOR-CON Take 2 tablets (40 mEq total) by mouth daily.   QUEtiapine 100 MG tablet Commonly known as: SEROQUEL Take 50-100 mg by mouth at bedtime.   spironolactone 25 MG tablet Commonly known as: ALDACTONE Take 0.5 tablets (12.5 mg total) by mouth daily.   Viibryd 20 MG Tabs Generic drug: Vilazodone HCl Take 20 mg by mouth daily.       Discharge Assessment: Vitals:   10/22/19 2109 10/23/19 0550  BP: 104/63 99/64  Pulse: 97 84  Resp: 18 18  Temp: (!) 97.5 F (36.4 C) 98.8 F (37.1 C)  SpO2: 98% 96%   Skin clean, dry and intact without evidence of skin break down, no evidence of  skin tears noted. IV catheter discontinued intact. Site without signs and symptoms of complications - no redness or edema noted at insertion site, patient denies c/o pain - only slight tenderness at site.  Dressing with slight pressure applied.  D/c Instructions-Education: Discharge instructions given to patient/family with verbalized understanding. D/c education completed with patient/family including follow up instructions, medication list, d/c activities limitations if indicated, with other d/c instructions as indicated by MD - patient able to verbalize understanding, all questions fully answered. Patient instructed to return to ED, call 911, or call  MD for any changes in condition.  Patient escorted via [place], and D/C home via private auto.  Loa Socks, RN 10/23/2019 2:14 PM

## 2019-10-23 NOTE — Discharge Summary (Signed)
Physician Discharge Summary   Tony Alvarez  male DOB: 1956/06/22  [id number]  PCP: [facility]  Admit date: 10/21/2019 Discharge date: 10/27/2019  Admitted From: home Disposition:  home Home Health: No CODE STATUS: Full code  Discharge Instructions    Diet - low sodium heart healthy   Complete by: As directed    Discharge instructions   Complete by: As directed    I have increased your Lasix to 40 mg twice a day (you used to take 20 mg daily), the prescription for this was already send after your last discharge. Please weight yourself every morning. If weight increases by 3 lbs in 1 day, or 5 lbs in 1 week, please call your outpatient doctor.   Please follow up with your primary care doctor 1-2 weeks after discharge to get lab work done to check your kidney function and electrolytes, and based on the lab results, your outpatient doctor needs to determine if you should continue or change Lasix dosing and potassium supplements.    I am holding your home Entresto because your blood pressure has been low during your hospitalization. Please follow up with your cardiologist to see if/when this medication can be resumed.   You will need to see an electrophysiologist about placing an ICD (defibrillator) for your heart.   Last but not least, avoid salt!  And don't drink too much fluid!   Dr. Enzo Bi   Increase activity slowly   Complete by: As directed        Hospital Course:  For full details, please see H&P, progress notes, consult notes and ancillary notes.  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    # Acute hypoxic respiratory  failure secondary to Acute on Chronic Combined Systolic and Diastolic CHF Exacerbation # Chronic systolic CHF with EF [id number] Pt was just discharged 3 hours prior presentation after not qualifying for suppl O2 (saturation remained above 90% with walking).  Pt ate a burger with a can of soda at [employer] right after discharge, went home, and started experiencing worsening dyspnea. Patient received Lasix 80 mg IV in the ED, and diuresed well (though no I/O charted).  Pt was then continued on PO Lasix 40 mg p.o. twice daily.  Prior to discharge, we repeated O2 saturation test with ambulation, and again, nursing documented "Patient Saturations on Room Air while Ambulating = 96%."  Pt didn't qualify for home suppl O2.  I had a discussion with pt that the most likely reason for his rapid re-admission was his dietary indiscretion with too much salt and fluid intake all at once.  Pt was sensitive to disturbance in fluid balance since he was just recently hospitalized and treated for fluid overload, and he has a known poor EF.  Pt was advised to keep a low-salt diet and restrict his fluid intake.  During this brief hospitalization, home Coreg and Aldactone were continued, and home Entresto continued to be held due to soft BP.  # History of LV thrombus, resolved Patient was on Xarelto which has been discontinued by cardiology during last admission as echocardiogram in July 2020 in October 2020 did not show apical thrombus.  # Stage III CKD, stable Creatinine 1.23, at baseline.   Hyperlipidemia Continued atorvastatin  Depression Pt reported suffering from depression after his wife passed away.  He was eager to be discharged to make it to his outpatient therapy session tomorrow.  Continude Seroquel and bupropion  OSA non-compliant with CPAP   Hypothyroidism Continued Levothyroxine  GERD Continued PPI  Tobacco Use Pt was previously smoking 1 ppd prior to his MI and has reduced his use to 1-5  cigarettes per day.   Discharge Instructions:  Allergies as of 10/23/2019   No Known Allergies     Medication List    TAKE these medications   acetaminophen 325 MG tablet Commonly known as: TYLENOL Take 2 tablets (650 mg total) by mouth every 6 (six) hours as needed for mild pain, fever or headache (or Fever >/= 101).   albuterol 108 (90 Base) MCG/ACT inhaler Commonly known as: VENTOLIN HFA Inhale 2 puffs into the lungs every 6 (six) hours as needed for wheezing or shortness of breath.   ARIPiprazole 15 MG tablet Commonly known as: ABILIFY Take 15 mg by mouth at bedtime.   aspirin 81 MG chewable tablet Chew 1 tablet (81 mg total) by mouth daily.   atorvastatin 80 MG tablet Commonly known as: LIPITOR Take 1 tablet (80 mg total) by mouth daily at 6 PM.   buPROPion 150 MG 12 hr tablet Commonly known as: WELLBUTRIN SR Take 150-300 mg by mouth 2 (two) times daily. Take two tablets (300 mg) by mouth every morning and one tablet (150 mg) with lunch   carvedilol 3.125 MG tablet Commonly known as: COREG Take 1 tablet (3.125 mg total) by mouth 2 (two) times daily with a meal.   clopidogrel 75 MG tablet Commonly known as: PLAVIX Take 75 mg by mouth daily.   Entresto 49-51 MG Generic drug: sacubitril-valsartan Please hold this medication until you follow up with either your primary care doctor or cardiologist, because your blood pressure was on the low side during hospitalization.   Eszopiclone 3 MG Tabs Take 3 mg by mouth at bedtime.   folic acid 1 MG tablet Commonly known as: FOLVITE Take 1 tablet (1 mg total) by mouth daily.   furosemide 40 MG tablet Commonly known as: LASIX Take 1 tablet (40 mg total) by mouth 2 (two) times daily. In early morning and early afternoon.   guaiFENesin 600 MG 12 hr tablet Commonly known as: MUCINEX Take 2 tablets (1,200 mg total) by mouth 2 (two) times daily as needed.   levothyroxine 125 MCG tablet Commonly known as:  SYNTHROID Take 125 mcg by mouth as needed.   multivitamin with minerals Tabs tablet Take 1 tablet by mouth every morning.   nitroGLYCERIN 0.4 MG SL tablet Commonly known as: NITROSTAT Place 1 tablet (0.4 mg total) under the tongue every 5 (five) minutes x 3 doses as needed for chest pain.   pantoprazole 40 MG tablet Commonly known as: Protonix Take 1 tablet (40 mg total) by mouth daily.   potassium chloride SA 20 MEQ tablet Commonly known as: KLOR-CON Take 2 tablets (40 mEq total) by mouth daily.   QUEtiapine 100 MG tablet Commonly known as: SEROQUEL Take 50-100 mg by mouth at bedtime.   spironolactone 25 MG tablet Commonly known as: ALDACTONE Take 0.5 tablets (12.5 mg total) by mouth daily.   Viibryd 20 MG Tabs Generic drug: Vilazodone HCl Take 20 mg by mouth daily.         No Known Allergies   The results of significant diagnostics from this hospitalization (including imaging, microbiology, ancillary and laboratory)  are listed below for reference.   Consultations:  none   Procedures/Studies: DG Chest 1 View  Result Date: 10/21/2019 CLINICAL DATA:  Shortness of breath EXAM: CHEST  1 VIEW COMPARISON:  10/17/2019 FINDINGS: Stable cardiomegaly. Pulmonary vascular congestion with diffuse interstitial opacities. Mild patchy alveolar opacities have decreased from prior. Probable trace bilateral pleural effusions. No pneumothorax. IMPRESSION: 1. Cardiomegaly with pulmonary vascular congestion and diffuse interstitial edema. 2. Mild patchy alveolar opacities have slightly improved from prior. Electronically Signed   By: Davina Poke M.D.   On: 10/21/2019 21:36   DG Chest Portable 1 View  Result Date: 10/17/2019 CLINICAL DATA:  Shortness of breath EXAM: PORTABLE CHEST 1 VIEW COMPARISON:  Radiograph 09/11/2019 FINDINGS: There is diffuse hazy opacity throughout both lungs with cephalized, indistinct pulmonary vascularity and likely trace left effusion tracking in the  fissure. More patchy airspace opacities are present in the periphery of the lungs. Cardiomegaly is similar to slightly increased from comparison portable radiograph. Calcified aorta is similar to prior. No pneumothorax. No acute osseous or soft tissue abnormality. Degenerative changes are present in the imaged spine and shoulders. IMPRESSION: 1. Findings consistent with moderate to severe CHF. 2. Cardiomegaly similar to slightly increased from prior. 3. More patchy airspace opacities in the periphery of the lungs could represent alveolar edema or pneumonia. 4. Trace left pleural effusion. Aortic Atherosclerosis (ICD10-I70.0). Electronically Signed   By: Lovena Le M.D.   On: 10/17/2019 19:31      Labs: BNP (last 3 results) Recent Labs    09/12/19 0423 10/17/19 1847 10/21/19 2220  BNP 758.0* 1,622.0* [id number]*   Basic Metabolic Panel: Recent Labs  Lab 10/21/19 2219 10/22/19 0612  NA 139 141  K 4.2 3.8  CL 106 106  CO2 24 23  GLUCOSE 128* 101*  BUN 27* 25*  CREATININE 1.23 1.25*  CALCIUM 9.6 9.3   Liver Function Tests: Recent Labs  Lab 10/22/19 0612  AST 17  ALT 21  ALKPHOS 63  BILITOT 1.5*  PROT 6.6  ALBUMIN 3.9   No results for input(s): LIPASE, AMYLASE in the last 168 hours. No results for input(s): AMMONIA in the last 168 hours. CBC: Recent Labs  Lab 10/21/19 2219 10/22/19 0612  WBC 9.7 8.0  NEUTROABS 7.2  --   HGB 12.8* 12.4*  HCT 40.1 38.7*  MCV 96.4 96.8  PLT 198 191   Cardiac Enzymes: No results for input(s): CKTOTAL, CKMB, CKMBINDEX, TROPONINI in the last 168 hours. BNP: Invalid input(s): POCBNP CBG: No results for input(s): GLUCAP in the last 168 hours. D-Dimer No results for input(s): DDIMER in the last 72 hours. Hgb A1c No results for input(s): HGBA1C in the last 72 hours. Lipid Profile No results for input(s): CHOL, HDL, LDLCALC, TRIG, CHOLHDL, LDLDIRECT in the last 72 hours. Thyroid function studies No results for input(s): TSH, T4TOTAL,  T3FREE, THYROIDAB in the last 72 hours.  Invalid input(s): FREET3 Anemia work up No results for input(s): VITAMINB12, FOLATE, FERRITIN, TIBC, IRON, RETICCTPCT in the last 72 hours. Urinalysis No results found for: COLORURINE, APPEARANCEUR, [place], [place], [place], [place], [place], [place], PROTEINUR, UROBILINOGEN, NITRITE, LEUKOCYTESUR Sepsis Labs Invalid input(s): PROCALCITONIN,  WBC,  LACTICIDVEN Microbiology Recent Results (from the past 240 hour(s))  Respiratory Panel by RT PCR (Flu A&B, Covid) - Nasopharyngeal Swab     Status: None   Collection Time: 10/17/19  9:28 PM   Specimen: Nasopharyngeal Swab  Result Value Ref Range Status   SARS Coronavirus 2 by RT PCR NEGATIVE NEGATIVE Final  Comment: (NOTE) SARS-CoV-2 target nucleic acids are NOT DETECTED. The SARS-CoV-2 RNA is generally detectable in upper respiratoy specimens during the acute phase of infection. The lowest concentration of SARS-CoV-2 viral copies this assay can detect is 131 copies/mL. A negative result does not preclude SARS-Cov-2 infection and should not be used as the sole basis for treatment or other patient management decisions. A negative result may occur with  improper specimen collection/handling, submission of specimen other than nasopharyngeal swab, presence of viral mutation(s) within the areas targeted by this assay, and inadequate number of viral copies (<131 copies/mL). A negative result must be combined with clinical observations, patient history, and epidemiological information. The expected result is Negative. Fact Sheet for Patients:  PinkCheek.be Fact Sheet for Healthcare Providers:  GravelBags.it This test is not yet ap proved or cleared by the [place] FDA and  has been authorized for detection and/or diagnosis of SARS-CoV-2 by FDA under an Emergency Use Authorization (EUA). This EUA will remain  in effect (meaning this  test can be used) for the duration of the COVID-19 declaration under Section 564(b)(1) of the Act, 21 U.S.C. section 360bbb-3(b)(1), unless the authorization is terminated or revoked sooner.    Influenza A by PCR NEGATIVE NEGATIVE Final   Influenza B by PCR NEGATIVE NEGATIVE Final    Comment: (NOTE) The Xpert Xpress SARS-CoV-2/FLU/RSV assay is intended as an aid in  the diagnosis of influenza from Nasopharyngeal swab specimens and  should not be used as a sole basis for treatment. Nasal washings and  aspirates are unacceptable for Xpert Xpress SARS-CoV-2/FLU/RSV  testing. Fact Sheet for Patients: PinkCheek.be Fact Sheet for Healthcare Providers: GravelBags.it This test is not yet approved or cleared by the [place] FDA and  has been authorized for detection and/or diagnosis of SARS-CoV-2 by  FDA under an Emergency Use Authorization (EUA). This EUA will remain  in effect (meaning this test can be used) for the duration of the  Covid-19 declaration under Section 564(b)(1) of the Act, 21  U.S.C. section 360bbb-3(b)(1), unless the authorization is  terminated or revoked. Performed at [facility], [address] [place], [place] [address]   MRSA PCR Screening     Status: None   Collection Time: 10/17/19 11:56 PM   Specimen: Nasal Mucosa; Nasopharyngeal  Result Value Ref Range Status   MRSA by PCR NEGATIVE NEGATIVE Final    Comment:        The GeneXpert MRSA Assay (FDA approved for NASAL specimens only), is one component of a comprehensive MRSA colonization surveillance program. It is not intended to diagnose MRSA infection nor to guide or monitor treatment for MRSA infections. Performed at [facility], [address] [place], [place] [address]   SARS CORONAVIRUS 2 (TAT 6-24 HRS) Nasopharyngeal Nasopharyngeal Swab     Status: None   Collection Time: 10/22/19 12:35 AM   Specimen: Nasopharyngeal Swab  Result Value  Ref Range Status   SARS Coronavirus 2 NEGATIVE NEGATIVE Final    Comment: (NOTE) SARS-CoV-2 target nucleic acids are NOT DETECTED. The SARS-CoV-2 RNA is generally detectable in upper and lower respiratory specimens during the acute phase of infection. Negative results do not preclude SARS-CoV-2 infection, do not rule out co-infections with other pathogens, and should not be used as the sole basis for treatment or other patient management decisions. Negative results must be combined with clinical observations, patient history, and epidemiological information. The expected result is Negative. Fact Sheet for Patients: SugarRoll.be Fact Sheet for Healthcare Providers: https://www.woods-mathews.com/ This test  is not yet approved or cleared by the [place] and  has been authorized for detection and/or diagnosis of SARS-CoV-2 by FDA under an Emergency Use Authorization (EUA). This EUA will remain  in effect (meaning this test can be used) for the duration of the COVID-19 declaration under Section 56 4(b)(1) of the Act, 21 U.S.C. section 360bbb-3(b)(1), unless the authorization is terminated or revoked sooner. Performed at [facility], [place] [address], [place], [place] [address]      Total time spend on discharging this patient, including the last patient exam, discussing the hospital stay, instructions for ongoing care as it relates to all pertinent caregivers, as well as preparing the medical discharge records, prescriptions, and/or referrals as applicable, is 35 minutes.    Enzo Bi, MD  [facility] 10/27/2019, 7:51 PM  If 7PM-7AM, please contact night-coverage

## 2019-10-23 NOTE — Progress Notes (Signed)
SATURATION QUALIFICATIONS: (This note is used to comply with regulatory documentation for home oxygen)  Patient Saturations on Room Air at Rest = 97%  Patient Saturations on Room Air while Ambulating = 96%  Please briefly explain why patient needs home oxygen:

## 2019-10-27 NOTE — Progress Notes (Signed)
testing

## 2019-11-12 ENCOUNTER — Inpatient Hospital Stay (HOSPITAL_COMMUNITY)
Admission: EM | Admit: 2019-11-12 | Discharge: 2019-11-14 | DRG: 291 | Disposition: A | Discharge status: Home / Self Care | Payer: BC Managed Care – PPO | Attending: Internal Medicine | Admitting: Internal Medicine

## 2019-11-12 ENCOUNTER — Emergency Department (HOSPITAL_COMMUNITY): Payer: BC Managed Care – PPO

## 2019-11-12 ENCOUNTER — Other Ambulatory Visit: Payer: Self-pay

## 2019-11-12 ENCOUNTER — Encounter (HOSPITAL_COMMUNITY): Payer: Self-pay | Admitting: Emergency Medicine

## 2019-11-12 DIAGNOSIS — I130 Hypertensive heart and chronic kidney disease with heart failure and stage 1 through stage 4 chronic kidney disease, or unspecified chronic kidney disease: Secondary | ICD-10-CM | POA: Diagnosis not present

## 2019-11-12 DIAGNOSIS — E785 Hyperlipidemia, unspecified: Secondary | ICD-10-CM | POA: Diagnosis present

## 2019-11-12 DIAGNOSIS — J9601 Acute respiratory failure with hypoxia: Secondary | ICD-10-CM | POA: Diagnosis present

## 2019-11-12 DIAGNOSIS — E039 Hypothyroidism, unspecified: Secondary | ICD-10-CM | POA: Diagnosis present

## 2019-11-12 DIAGNOSIS — G4730 Sleep apnea, unspecified: Secondary | ICD-10-CM | POA: Diagnosis present

## 2019-11-12 DIAGNOSIS — N183 Chronic kidney disease, stage 3 unspecified: Secondary | ICD-10-CM | POA: Diagnosis present

## 2019-11-12 DIAGNOSIS — I2510 Atherosclerotic heart disease of native coronary artery without angina pectoris: Secondary | ICD-10-CM | POA: Diagnosis not present

## 2019-11-12 DIAGNOSIS — Z7982 Long term (current) use of aspirin: Secondary | ICD-10-CM

## 2019-11-12 DIAGNOSIS — Z955 Presence of coronary angioplasty implant and graft: Secondary | ICD-10-CM

## 2019-11-12 DIAGNOSIS — J449 Chronic obstructive pulmonary disease, unspecified: Secondary | ICD-10-CM | POA: Diagnosis present

## 2019-11-12 DIAGNOSIS — Z79899 Other long term (current) drug therapy: Secondary | ICD-10-CM

## 2019-11-12 DIAGNOSIS — Z87891 Personal history of nicotine dependence: Secondary | ICD-10-CM

## 2019-11-12 DIAGNOSIS — G4733 Obstructive sleep apnea (adult) (pediatric): Secondary | ICD-10-CM

## 2019-11-12 DIAGNOSIS — Z79890 Hormone replacement therapy (postmenopausal): Secondary | ICD-10-CM

## 2019-11-12 DIAGNOSIS — I252 Old myocardial infarction: Secondary | ICD-10-CM

## 2019-11-12 DIAGNOSIS — K2270 Barrett's esophagus without dysplasia: Secondary | ICD-10-CM | POA: Diagnosis present

## 2019-11-12 DIAGNOSIS — I10 Essential (primary) hypertension: Secondary | ICD-10-CM

## 2019-11-12 DIAGNOSIS — F329 Major depressive disorder, single episode, unspecified: Secondary | ICD-10-CM | POA: Diagnosis present

## 2019-11-12 DIAGNOSIS — Z7289 Other problems related to lifestyle: Secondary | ICD-10-CM

## 2019-11-12 DIAGNOSIS — I509 Heart failure, unspecified: Secondary | ICD-10-CM

## 2019-11-12 DIAGNOSIS — I5023 Acute on chronic systolic (congestive) heart failure: Secondary | ICD-10-CM | POA: Diagnosis present

## 2019-11-12 DIAGNOSIS — F1010 Alcohol abuse, uncomplicated: Secondary | ICD-10-CM | POA: Diagnosis not present

## 2019-11-12 DIAGNOSIS — I255 Ischemic cardiomyopathy: Secondary | ICD-10-CM | POA: Diagnosis present

## 2019-11-12 DIAGNOSIS — I5043 Acute on chronic combined systolic (congestive) and diastolic (congestive) heart failure: Secondary | ICD-10-CM | POA: Diagnosis not present

## 2019-11-12 DIAGNOSIS — Z7902 Long term (current) use of antithrombotics/antiplatelets: Secondary | ICD-10-CM

## 2019-11-12 DIAGNOSIS — Z20822 Contact with and (suspected) exposure to covid-19: Secondary | ICD-10-CM | POA: Diagnosis present

## 2019-11-12 LAB — CBC
HCT: 43.3 % (ref 39.0–52.0)
Hemoglobin: 13.8 g/dL (ref 13.0–17.0)
MCH: 31.3 pg (ref 26.0–34.0)
MCHC: 31.9 g/dL (ref 30.0–36.0)
MCV: 98.2 fL (ref 80.0–100.0)
Platelets: 201 10*3/uL (ref 150–400)
RBC: 4.41 MIL/uL (ref 4.22–5.81)
RDW: 16 % — ABNORMAL HIGH (ref 11.5–15.5)
WBC: 8.1 10*3/uL (ref 4.0–10.5)
nRBC: 0.2 % (ref 0.0–0.2)

## 2019-11-12 LAB — BASIC METABOLIC PANEL
Anion gap: 11 (ref 5–15)
BUN: 18 mg/dL (ref 8–23)
CO2: 25 mmol/L (ref 22–32)
Calcium: 9.8 mg/dL (ref 8.9–10.3)
Chloride: 106 mmol/L (ref 98–111)
Creatinine, Ser: 1.34 mg/dL — ABNORMAL HIGH (ref 0.61–1.24)
GFR calc Af Amer: >60 mL/min (ref >60)
GFR calc non Af Amer: 57 mL/min — ABNORMAL LOW (ref >60)
Glucose, Bld: 125 mg/dL — ABNORMAL HIGH (ref 70–99)
Potassium: 3.9 mmol/L (ref 3.5–5.1)
Sodium: 142 mmol/L (ref 135–145)

## 2019-11-12 LAB — TROPONIN I (HIGH SENSITIVITY)
Troponin I (High Sensitivity): 12 ng/L (ref <18)
Troponin I (High Sensitivity): 14 ng/L (ref <18)

## 2019-11-12 LAB — BRAIN NATRIURETIC PEPTIDE: B Natriuretic Peptide: 462 pg/mL — ABNORMAL HIGH (ref 0.0–100.0)

## 2019-11-12 LAB — POC SARS CORONAVIRUS 2 AG -  ED: SARS Coronavirus 2 Ag: NEGATIVE

## 2019-11-12 MED ORDER — BUPROPION HCL ER (SR) 150 MG PO TB12
300.0000 mg | ORAL_TABLET | Freq: Every morning | ORAL | Status: DC
  Administered 2019-11-13 – 2019-11-14 (×2): 300 mg via ORAL
  Filled 2019-11-12 (×2): qty 2

## 2019-11-12 MED ORDER — THIAMINE HCL 100 MG PO TABS
100.0000 mg | ORAL_TABLET | Freq: Every day | ORAL | Status: DC
  Administered 2019-11-13 – 2019-11-14 (×2): 100 mg via ORAL
  Filled 2019-11-12 (×3): qty 1

## 2019-11-12 MED ORDER — ATORVASTATIN CALCIUM 40 MG PO TABS
80.0000 mg | ORAL_TABLET | Freq: Every day | ORAL | Status: DC
  Administered 2019-11-13: 80 mg via ORAL
  Filled 2019-11-12: qty 2

## 2019-11-12 MED ORDER — ASPIRIN 81 MG PO CHEW
81.0000 mg | CHEWABLE_TABLET | Freq: Every day | ORAL | Status: DC
  Administered 2019-11-13 – 2019-11-14 (×2): 81 mg via ORAL
  Filled 2019-11-12 (×2): qty 1

## 2019-11-12 MED ORDER — CARVEDILOL 3.125 MG PO TABS
3.1250 mg | ORAL_TABLET | Freq: Two times a day (BID) | ORAL | Status: DC
  Administered 2019-11-13 – 2019-11-14 (×3): 3.125 mg via ORAL
  Filled 2019-11-12 (×3): qty 1

## 2019-11-12 MED ORDER — SODIUM CHLORIDE 0.9 % IV SOLN: 250.0000 mL | INTRAVENOUS | Status: DC | PRN

## 2019-11-12 MED ORDER — LORAZEPAM 2 MG/ML IJ SOLN: 1.0000 mg | INTRAMUSCULAR | Status: DC | PRN

## 2019-11-12 MED ORDER — SPIRONOLACTONE 12.5 MG HALF TABLET
12.5000 mg | ORAL_TABLET | Freq: Every day | ORAL | Status: DC
  Administered 2019-11-13 – 2019-11-14 (×2): 12.5 mg via ORAL
  Filled 2019-11-12 (×4): qty 1

## 2019-11-12 MED ORDER — LORAZEPAM 1 MG PO TABS: 1.0000 mg | ORAL_TABLET | ORAL | Status: DC | PRN

## 2019-11-12 MED ORDER — FUROSEMIDE 10 MG/ML IJ SOLN
20.0000 mg | Freq: Once | INTRAMUSCULAR | Status: AC
  Administered 2019-11-12: 20 mg via INTRAVENOUS
  Filled 2019-11-12: qty 2

## 2019-11-12 MED ORDER — ADULT MULTIVITAMIN W/MINERALS CH
1.0000 | ORAL_TABLET | Freq: Every day | ORAL | Status: DC
  Administered 2019-11-13 – 2019-11-14 (×2): 1 via ORAL
  Filled 2019-11-12 (×2): qty 1

## 2019-11-12 MED ORDER — VILAZODONE HCL 20 MG PO TABS
20.0000 mg | ORAL_TABLET | Freq: Every day | ORAL | Status: DC
  Administered 2019-11-13 – 2019-11-14 (×2): 20 mg via ORAL
  Filled 2019-11-12 (×4): qty 1

## 2019-11-12 MED ORDER — SODIUM CHLORIDE 0.9% FLUSH: 3.0000 mL | INTRAVENOUS | Status: DC | PRN

## 2019-11-12 MED ORDER — ASPIRIN 81 MG PO CHEW
324.0000 mg | CHEWABLE_TABLET | Freq: Once | ORAL | Status: AC
  Administered 2019-11-12: 324 mg via ORAL
  Filled 2019-11-12: qty 4

## 2019-11-12 MED ORDER — CLOPIDOGREL BISULFATE 75 MG PO TABS
75.0000 mg | ORAL_TABLET | Freq: Every day | ORAL | Status: DC
  Administered 2019-11-13: 75 mg via ORAL
  Filled 2019-11-12: qty 1

## 2019-11-12 MED ORDER — THIAMINE HCL 100 MG/ML IJ SOLN
100.0000 mg | Freq: Every day | INTRAMUSCULAR | Status: DC
  Filled 2019-11-12: qty 2

## 2019-11-12 MED ORDER — FUROSEMIDE 10 MG/ML IJ SOLN
60.0000 mg | Freq: Two times a day (BID) | INTRAMUSCULAR | Status: DC
  Administered 2019-11-13 – 2019-11-14 (×3): 60 mg via INTRAVENOUS
  Filled 2019-11-12 (×3): qty 6

## 2019-11-12 MED ORDER — ENSURE ENLIVE PO LIQD: 237.0000 mL | Freq: Two times a day (BID) | ORAL | Status: DC

## 2019-11-12 MED ORDER — QUETIAPINE FUMARATE 25 MG PO TABS
50.0000 mg | ORAL_TABLET | Freq: Every day | ORAL | Status: DC
  Administered 2019-11-12 – 2019-11-13 (×2): 50 mg via ORAL
  Filled 2019-11-12 (×2): qty 2

## 2019-11-12 MED ORDER — ARIPIPRAZOLE 5 MG PO TABS
15.0000 mg | ORAL_TABLET | Freq: Every day | ORAL | Status: DC
  Administered 2019-11-12 – 2019-11-13 (×2): 15 mg via ORAL
  Filled 2019-11-12 (×2): qty 1

## 2019-11-12 MED ORDER — LEVOTHYROXINE SODIUM 125 MCG PO TABS
125.0000 ug | ORAL_TABLET | Freq: Every day | ORAL | Status: DC
  Administered 2019-11-13 – 2019-11-14 (×2): 125 ug via ORAL
  Filled 2019-11-12 (×2): qty 1

## 2019-11-12 MED ORDER — SODIUM CHLORIDE 0.9% FLUSH
3.0000 mL | Freq: Two times a day (BID) | INTRAVENOUS | Status: DC
  Administered 2019-11-12 – 2019-11-14 (×4): 3 mL via INTRAVENOUS

## 2019-11-12 MED ORDER — PANTOPRAZOLE SODIUM 40 MG PO TBEC
40.0000 mg | DELAYED_RELEASE_TABLET | Freq: Every day | ORAL | Status: DC
  Administered 2019-11-13 – 2019-11-14 (×2): 40 mg via ORAL
  Filled 2019-11-12 (×2): qty 1

## 2019-11-12 MED ORDER — FUROSEMIDE 10 MG/ML IJ SOLN
40.0000 mg | Freq: Once | INTRAMUSCULAR | Status: AC
  Administered 2019-11-12: 40 mg via INTRAVENOUS
  Filled 2019-11-12: qty 4

## 2019-11-12 MED ORDER — ACETAMINOPHEN 325 MG PO TABS: 650.0000 mg | ORAL_TABLET | ORAL | Status: DC | PRN

## 2019-11-12 MED ORDER — ALBUTEROL SULFATE (2.5 MG/3ML) 0.083% IN NEBU: 3.0000 mL | INHALATION_SOLUTION | Freq: Four times a day (QID) | RESPIRATORY_TRACT | Status: DC | PRN

## 2019-11-12 MED ORDER — FOLIC ACID 1 MG PO TABS
1.0000 mg | ORAL_TABLET | Freq: Every day | ORAL | Status: DC
  Administered 2019-11-13 – 2019-11-14 (×2): 1 mg via ORAL
  Filled 2019-11-12 (×2): qty 1

## 2019-11-12 MED ORDER — ZOLPIDEM TARTRATE 5 MG PO TABS: 5.0000 mg | ORAL_TABLET | Freq: Every evening | ORAL | Status: DC | PRN

## 2019-11-12 MED ORDER — BUPROPION HCL ER (SR) 150 MG PO TB12
150.0000 mg | ORAL_TABLET | Freq: Every day | ORAL | Status: DC
  Administered 2019-11-13 – 2019-11-14 (×2): 150 mg via ORAL
  Filled 2019-11-12 (×2): qty 1

## 2019-11-12 MED ORDER — ONDANSETRON HCL 4 MG/2ML IJ SOLN: 4.0000 mg | Freq: Four times a day (QID) | INTRAMUSCULAR | Status: DC | PRN

## 2019-11-12 MED ORDER — ENOXAPARIN SODIUM 60 MG/0.6ML ~~LOC~~ SOLN
0.5000 mg/kg | SUBCUTANEOUS | Status: DC
  Administered 2019-11-13 – 2019-11-14 (×2): 50 mg via SUBCUTANEOUS
  Filled 2019-11-12 (×2): qty 0.6

## 2019-11-12 MED ORDER — POTASSIUM CHLORIDE CRYS ER 20 MEQ PO TBCR
40.0000 meq | EXTENDED_RELEASE_TABLET | Freq: Every day | ORAL | Status: DC
  Administered 2019-11-13 – 2019-11-14 (×2): 40 meq via ORAL
  Filled 2019-11-12 (×2): qty 2

## 2019-11-12 NOTE — ED Provider Notes (Signed)
CHF exacerbation, need to check O2 with ambulation per request of hospitalist, Dr. Nehemiah Settle.  Pt to be admitted. Need covid test, POC ordered, will f/u on result and order additional covid-19 screening if negative.   7:07 PM POC COVID-19 test negative, will place rapid PCR test for confirmation.  Pt to be admitted by hospitalist Dr. Nehemiah Settle.  BP 95/69   Pulse 92   Temp 97.8 F (36.6 C) (Oral)   Resp (!) 23   Ht '5\' 10"'  (1.778 m)   Wt 102.1 kg   SpO2 91%   BMI 32.28 kg/m   Results for orders placed or performed during the hospital encounter of 77/82/42  Basic metabolic panel  Result Value Ref Range   Sodium 142 135 - 145 mmol/L   Potassium 3.9 3.5 - 5.1 mmol/L   Chloride 106 98 - 111 mmol/L   CO2 25 22 - 32 mmol/L   Glucose, Bld 125 (H) 70 - 99 mg/dL   BUN 18 8 - 23 mg/dL   Creatinine, Ser 1.34 (H) 0.61 - 1.24 mg/dL   Calcium 9.8 8.9 - 10.3 mg/dL   GFR calc non Af Amer 57 (L) >60 mL/min   GFR calc Af Amer >60 >60 mL/min   Anion gap 11 5 - 15  CBC  Result Value Ref Range   WBC 8.1 4.0 - 10.5 K/uL   RBC 4.41 4.22 - 5.81 MIL/uL   Hemoglobin 13.8 13.0 - 17.0 g/dL   HCT 43.3 39.0 - 52.0 %   MCV 98.2 80.0 - 100.0 fL   MCH 31.3 26.0 - 34.0 pg   MCHC 31.9 30.0 - 36.0 g/dL   RDW 16.0 (H) 11.5 - 15.5 %   Platelets 201 150 - 400 K/uL   nRBC 0.2 0.0 - 0.2 %  Brain natriuretic peptide  Result Value Ref Range   B Natriuretic Peptide 462.0 (H) 0.0 - 100.0 pg/mL  POC SARS Coronavirus 2 Ag-ED - Nasal Swab (BD Veritor Kit)  Result Value Ref Range   SARS Coronavirus 2 Ag NEGATIVE NEGATIVE  Troponin I (High Sensitivity)  Result Value Ref Range   Troponin I (High Sensitivity) 12 <18 ng/L  Troponin I (High Sensitivity)  Result Value Ref Range   Troponin I (High Sensitivity) 14 <18 ng/L   DG Chest 1 View  Result Date: 10/21/2019 CLINICAL DATA:  Shortness of breath EXAM: CHEST  1 VIEW COMPARISON:  10/17/2019 FINDINGS: Stable cardiomegaly. Pulmonary vascular congestion with diffuse  interstitial opacities. Mild patchy alveolar opacities have decreased from prior. Probable trace bilateral pleural effusions. No pneumothorax. IMPRESSION: 1. Cardiomegaly with pulmonary vascular congestion and diffuse interstitial edema. 2. Mild patchy alveolar opacities have slightly improved from prior. Electronically Signed   By: Davina Poke M.D.   On: 10/21/2019 21:36   DG Chest 2 View  Result Date: 11/12/2019 CLINICAL DATA:  short of breath and chest tightness EXAM: CHEST - 2 VIEW COMPARISON:  10/21/2019 FINDINGS: Stable cardiac enlargement. Small to moderate bilateral pleural effusions identified. Diffuse increase interstitial markings throughout both lungs identified compatible with pulmonary edema. The chronic interstitial changes of emphysema are also noted. No airspace consolidation. IMPRESSION: 1. Cardiac enlargement and congestive heart failure. Electronically Signed   By: Kerby Moors M.D.   On: 11/12/2019 14:33   DG Chest Portable 1 View  Result Date: 10/17/2019 CLINICAL DATA:  Shortness of breath EXAM: PORTABLE CHEST 1 VIEW COMPARISON:  Radiograph 09/11/2019 FINDINGS: There is diffuse hazy opacity throughout both lungs with cephalized, indistinct pulmonary vascularity and  likely trace left effusion tracking in the fissure. More patchy airspace opacities are present in the periphery of the lungs. Cardiomegaly is similar to slightly increased from comparison portable radiograph. Calcified aorta is similar to prior. No pneumothorax. No acute osseous or soft tissue abnormality. Degenerative changes are present in the imaged spine and shoulders. IMPRESSION: 1. Findings consistent with moderate to severe CHF. 2. Cardiomegaly similar to slightly increased from prior. 3. More patchy airspace opacities in the periphery of the lungs could represent alveolar edema or pneumonia. 4. Trace left pleural effusion. Aortic Atherosclerosis (ICD10-I70.0). Electronically Signed   By: Lovena Le M.D.   On:  10/17/2019 19:31      Domenic Moras, PA-C 11/12/19 Talbot Grumbling, MD 11/14/19 1451

## 2019-11-12 NOTE — ED Notes (Signed)
Pt ambulatory to restroom with no difficulty or distress noted.

## 2019-11-12 NOTE — H&P (Signed)
History and Physical  Tony Alvarez [id number] DOB: [birth year] DOA: 11/12/2019  Referring physician: Arlean Hopping, PA-C, ED provider PCP: A, [facility]  Outpatient Specialists:   Patient Coming From: home  Chief Complaint: SOB, chest tightness  HPI: Tony Alvarez is a 64 y.o. male with a history of coronary artery disease with STEMI in 01/2019 and drug-eluting stent in proximal LAD, CHF with ischemic cardiomyopathy and EF of 25 to 30% on 08/2019, hypertension, hypothyroidism, sleep apnea, history of alcohol abuse.  Patient seen for shortness of breath and chest tightness that started earlier today.  He has been taking his Lasix at home as prescribed.  It appears that he is also prescribed spironolactone, however he has not begun taking it.  Denies edema, has dry cough. Shortness of breath worse with exertion and improved with rest.  No other palliating or provoking factors.  Does feel that his breathing is slightly better since being here.  Emergency Department Course: Troponins negative.  BNP 462.  Chest x-ray significant for pulmonary edema  Review of Systems:    Pt denies any fevers, chills, nausea, vomiting, diarrhea, constipation, abdominal pain, wheezing, palpitations, headache, vision changes, lightheadedness, dizziness, melena, rectal bleeding.  Review of systems are otherwise negative  Past Medical History:  Diagnosis Date  . Barrett's esophagus 04/21/2018  . CAD (coronary artery disease)    a. s/p STEMI in 01/2019 with DES to proximal LAD  . CHF (congestive heart failure) ([place])    a. EF 35-40% by echo in 01/2019, at 25-30% by repeat echo in 05/2019 and 08/2019  . Depression   . High cholesterol   . Hypertension   . Hypothyroid   . Sleep apnea    wears CPAP at home   Past Surgical History:  Procedure Laterality Date  . BIOPSY  05/24/2018   Procedure: BIOPSY;  Surgeon: Rogene Houston, MD;  [facility];  Service: Endoscopy;;  gastric  .  COLONOSCOPY    . COLONOSCOPY N/A 05/24/2018   Procedure: COLONOSCOPY;  Surgeon: Rogene Houston, MD;  [facility];  Service: Endoscopy;  Laterality: N/A;  8:55  . COLONOSCOPY WITH ESOPHAGOGASTRODUODENOSCOPY (EGD)    . CORONARY STENT INTERVENTION N/A 01/26/2019   Procedure: CORONARY STENT INTERVENTION;  Surgeon: Troy Sine, MD;  Location: [place] [facility];  Service: Cardiovascular;  Laterality: N/A;  . CORONARY/GRAFT ACUTE MI REVASCULARIZATION N/A 01/26/2019   Procedure: Coronary/Graft Acute MI Revascularization;  Surgeon: Troy Sine, MD;  Location: [place] [facility];  Service: Cardiovascular;  Laterality: N/A;  . ESOPHAGOGASTRODUODENOSCOPY N/A 05/24/2018   Procedure: ESOPHAGOGASTRODUODENOSCOPY (EGD);  Surgeon: Rogene Houston, MD;  [facility];  Service: Endoscopy;  Laterality: N/A;  . LEFT HEART CATH AND CORONARY ANGIOGRAPHY N/A 01/26/2019   Procedure: LEFT HEART CATH AND CORONARY ANGIOGRAPHY;  Surgeon: Troy Sine, MD;  Location: [place] [facility];  Service: Cardiovascular;  Laterality: N/A;  . MEDIAL PARTIAL KNEE REPLACEMENT     2015  . POLYPECTOMY  05/24/2018   Procedure: POLYPECTOMY;  Surgeon: Rogene Houston, MD;  [facility];  Service: Endoscopy;;  colon  . rt hand injury    . shoulder surgery for bursitis    . TONSILLECTOMY     Social History:  reports that he has quit smoking. His smoking use included cigarettes. He has a 10.00 pack-year smoking history. He has never used smokeless tobacco. He reports current alcohol use. He reports that he does not use  drugs. Patient lives at home  No Known Allergies  Family History  Problem Relation Age of Onset  . Colon cancer Neg Hx       Prior to Admission medications   Medication Sig Start Date End Date Taking? Authorizing Provider  acetaminophen (TYLENOL) 325 MG tablet Take 2 tablets (650 mg total) by mouth every 6 (six) hours as needed for mild pain, fever or headache (or Fever >/=  101). 09/13/19  Yes Emokpae, Courage, MD  albuterol (VENTOLIN HFA) 108 (90 Base) MCG/ACT inhaler Inhale 2 puffs into the lungs every 6 (six) hours as needed for wheezing or shortness of breath. 09/13/19  Yes Emokpae, Courage, MD  ARIPiprazole (ABILIFY) 15 MG tablet Take 15 mg by mouth at bedtime. 09/01/19  Yes [provider]  aspirin 81 MG chewable tablet Chew 1 tablet (81 mg total) by mouth daily. 10/22/19  Yes Enzo Bi, MD  atorvastatin (LIPITOR) 80 MG tablet Take 1 tablet (80 mg total) by mouth daily at 6 PM. 01/30/19  Yes Almyra Deforest, PA  buPROPion [facility] SR) 150 MG 12 hr tablet Take 150-300 mg by mouth 2 (two) times daily. Take two tablets (300 mg) by mouth every morning and one tablet (150 mg) with lunch 03/27/18  Yes [provider]  carvedilol (COREG) 3.125 MG tablet Take 1 tablet (3.125 mg total) by mouth 2 (two) times daily with a meal. 01/30/19  Yes Almyra Deforest, PA  clopidogrel (PLAVIX) 75 MG tablet Take 75 mg by mouth daily.   Yes [provider]  Eszopiclone 3 MG TABS Take 3 mg by mouth at bedtime. 09/01/19  Yes [provider]  folic acid (FOLVITE) 1 MG tablet Take 1 tablet (1 mg total) by mouth daily. 09/14/19  Yes Emokpae, Courage, MD  furosemide (LASIX) 40 MG tablet Take 1 tablet (40 mg total) by mouth 2 (two) times daily. In early morning and early afternoon. 10/21/19 01/19/20 Yes Enzo Bi, MD  guaiFENesin (MUCINEX) 600 MG 12 hr tablet Take 2 tablets (1,200 mg total) by mouth 2 (two) times daily as needed. 10/21/19  Yes Enzo Bi, MD  levothyroxine (SYNTHROID) 125 MCG tablet Take 125 mcg by mouth daily as needed (for thyroid levels).    Yes [provider]  Multiple Vitamin (MULTIVITAMIN WITH MINERALS) TABS tablet Take 1 tablet by mouth every morning.   Yes [provider]  nitroGLYCERIN (NITROSTAT) 0.4 MG SL tablet Place 1 tablet (0.4 mg total) under the tongue every 5 (five) minutes x 3 doses as needed for chest pain. 01/30/19  Yes  Almyra Deforest, PA  pantoprazole (PROTONIX) 40 MG tablet Take 1 tablet (40 mg total) by mouth daily. 09/13/19 09/12/20 Yes Emokpae, Courage, MD  potassium chloride SA (KLOR-CON) 20 MEQ tablet Take 2 tablets (40 mEq total) by mouth daily. 10/21/19 11/20/19 Yes Enzo Bi, MD  QUEtiapine (SEROQUEL) 100 MG tablet Take 50-100 mg by mouth at bedtime.  10/04/19  Yes [provider]  VIIBRYD 20 MG TABS Take 20 mg by mouth daily.  09/05/19  Yes [provider]  sacubitril-valsartan (ENTRESTO) 49-51 MG Please hold this medication until you follow up with either your primary care doctor or cardiologist, because your blood pressure was on the low side during hospitalization. Patient not taking: Reported on 11/12/2019 10/23/19   Enzo Bi, MD  spironolactone (ALDACTONE) 25 MG tablet Take 0.5 tablets (12.5 mg total) by mouth daily. Patient not taking: Reported on 10/18/2019 08/29/19   Troy Sine, MD    Physical  Exam: BP 95/69   Pulse 92   Temp 97.8 F (36.6 C) (Oral)   Resp (!) 23   Ht 5\' 10"  (1.778 m)   Wt 102.1 kg   SpO2 91%   BMI 32.28 kg/m   . General: Middle-age male. Awake and alert and oriented x3. No acute cardiopulmonary distress.  Marland Kitchen HEENT: Normocephalic atraumatic.  Right and left ears normal in appearance.  Pupils equal, round, reactive to light. Extraocular muscles are intact. Sclerae anicteric and noninjected.  Moist mucosal membranes. No mucosal lesions.  . Neck: Neck supple without lymphadenopathy. No carotid bruits. No masses palpated.  . Cardiovascular: Regular rate with normal S1-S2 sounds. No murmurs, rubs, gallops auscultated. No JVD.  Marland Kitchen Respiratory: Rales in bases bilaterally.  Diminished breath sounds. no accessory muscle use. . Abdomen: Soft, nontender, nondistended. Active bowel sounds. No masses or hepatosplenomegaly  . Skin: No rashes, lesions, or ulcerations.  Dry, warm to touch. 2+ dorsalis pedis and radial pulses. . Musculoskeletal: No calf or leg pain. All  major joints not erythematous nontender.  No upper or lower joint deformation.  Good ROM.  No contractures  . Psychiatric: Intact judgment and insight. Pleasant and cooperative. . Neurologic: No focal neurological deficits. Strength is 5/5 and symmetric in upper and lower extremities.  Cranial nerves II through XII are grossly intact.           Labs on Admission: I have personally reviewed following labs and imaging studies  CBC: Recent Labs  Lab 11/12/19 1511  WBC 8.1  HGB 13.8  HCT 43.3  MCV 98.2  PLT 123456   Basic Metabolic Panel: Recent Labs  Lab 11/12/19 1511  NA 142  K 3.9  CL 106  CO2 25  GLUCOSE 125*  BUN 18  CREATININE 1.34*  CALCIUM 9.8   GFR: Estimated Creatinine Clearance: 69.3 mL/min (A) (by C-G formula based on SCr of 1.34 mg/dL (H)). Liver Function Tests: No results for input(s): AST, ALT, ALKPHOS, BILITOT, PROT, ALBUMIN in the last 168 hours. No results for input(s): LIPASE, AMYLASE in the last 168 hours. No results for input(s): AMMONIA in the last 168 hours. Coagulation Profile: No results for input(s): INR, PROTIME in the last 168 hours. Cardiac Enzymes: No results for input(s): CKTOTAL, CKMB, CKMBINDEX, TROPONINI in the last 168 hours. BNP (last 3 results) Recent Labs    07/26/19 1102  PROBNP 992*   HbA1C: No results for input(s): HGBA1C in the last 72 hours. CBG: No results for input(s): GLUCAP in the last 168 hours. Lipid Profile: No results for input(s): CHOL, HDL, LDLCALC, TRIG, CHOLHDL, LDLDIRECT in the last 72 hours. Thyroid Function Tests: No results for input(s): TSH, T4TOTAL, FREET4, T3FREE, THYROIDAB in the last 72 hours. Anemia Panel: No results for input(s): VITAMINB12, FOLATE, FERRITIN, TIBC, IRON, RETICCTPCT in the last 72 hours. Urine analysis: No results found for: COLORURINE, APPEARANCEUR, LABSPEC, PHURINE, GLUCOSEU, HGBUR, BILIRUBINUR, KETONESUR, PROTEINUR, UROBILINOGEN, NITRITE, LEUKOCYTESUR Sepsis  Labs: @LABRCNTIP (procalcitonin:4,lacticidven:4) )No results found for this or any previous visit (from the past 240 hour(s)).   Radiological Exams on Admission: DG Chest 2 View  Result Date: 11/12/2019 CLINICAL DATA:  short of breath and chest tightness EXAM: CHEST - 2 VIEW COMPARISON:  10/21/2019 FINDINGS: Stable cardiac enlargement. Small to moderate bilateral pleural effusions identified. Diffuse increase interstitial markings throughout both lungs identified compatible with pulmonary edema. The chronic interstitial changes of emphysema are also noted. No airspace consolidation. IMPRESSION: 1. Cardiac enlargement and congestive heart failure. Electronically Signed   By: Lovena Le  Clovis Riley M.D.   On: 11/12/2019 14:33    EKG: Independently reviewed.  Sinus tachycardia with left axis deviation.  Left atrial enlargement.  Nonspecific intraventricular conduction delay.  No acute ST changes.  Assessment/Plan: Active Problems:   Alcohol abuse   Ischemic cardiomyopathy   CAD (coronary artery disease)   Hypothyroid   COPD (chronic obstructive pulmonary disease) (HCC)   Sleep apnea   Hypertension   Acute exacerbation of CHF (congestive heart failure) ([place])   Acute respiratory failure with hypoxia [facility])    This patient was discussed with the ED physician, including pertinent vitals, physical exam findings, labs, and imaging.  We also discussed care given by the ED provider.  1. Acute respiratory failure with hypoxia a. O2 2. Acute exacerbation of CHF a. Systolic heart failure with EF of 25 to 30% b. Echocardiogram done within the past 90 days, will not repeat Telemetry monitoring Strict I/O Daily Weights Diuresis: Lasix 60 mg IV twice daily, spironolactone Potassium: 40 mEq twice a day by mouth Echo cardiac exam tomorrow Repeat BMP tomorrow 3. Hypertension a. Continue carvedilol 4. COPD a. Stable b. Continue home inhaler 5. Hypothyroidism a. Continue Synthroid 6. Coronary artery  disease with ischemic cardiomyopathy a. No active coronary artery disease 7. History of alcohol abuse a. CIWA protocol  DVT prophylaxis: Lovenox Consultants: None Code Status: Full code Family Communication: None Disposition Plan: Patient should be able to return home following admission diuresis   Truett Mainland, DO

## 2019-11-12 NOTE — ED Provider Notes (Signed)
[facility] EMERGENCY DEPARTMENT Provider Note   CSN: [id number] Arrival date & time: 11/12/19  1334     History Chief Complaint  Patient presents with  . Shortness of Breath    Tony Alvarez is a 64 y.o. male.  HPI      Tony Alvarez is a 64 y.o. male, with a history of CAD, CHF, HTN, hyperlipidemia, presenting to the ED with shortness of breath and chest discomfort beginning this morning upon waking around 8 AM.  Chest discomfort was in the central and left chest, felt like a fullness, 2-3/10, nonradiating.  This lasted until just after ED arrival.  Shortness of breath also improved after ED arrival.  He used his albuterol inhaler one time prior to arrival without improvement. States he has been compliant with his Lasix and other medications. Denies fever/chills, acute cough, acute lower extremity edema/pain, acute orthopnea, abdominal pain, N/V/D, dizziness, diaphoresis, syncope, or any other complaints.  Past Medical History:  Diagnosis Date  . Barrett's esophagus 04/21/2018  . CAD (coronary artery disease)    a. s/p STEMI in 01/2019 with DES to proximal LAD  . CHF (congestive heart failure) ([place])    a. EF 35-40% by echo in 01/2019, at 25-30% by repeat echo in 05/2019 and 08/2019  . Depression   . High cholesterol   . Hypertension   . Hypothyroid   . Sleep apnea    wears CPAP at home    Patient Active Problem List   Diagnosis Date Noted  . Acute exacerbation of CHF (congestive heart failure) ([employer]) 10/22/2019  . CHF exacerbation ([place]) 10/17/2019  . Acute and chronic respiratory failure (acute-on-chronic) ([place]) 09/11/2019  . Sleep apnea   . Hypertension   . AKI (acute kidney injury) ([place])   . CAD (coronary artery disease) 06/24/2019  . Hypothyroid   . COPD (chronic obstructive pulmonary disease) ([employer])   . Depression   . Hyperlipidemia 01/30/2019  . LV (left ventricular) mural thrombus with acute MI ([place]) 01/30/2019  . Alcohol abuse 01/30/2019  . Alcohol withdrawal  delirium ([place]) 01/30/2019  . Elevated liver enzymes 01/30/2019  . Ischemic cardiomyopathy 01/30/2019  . Acute on chronic systolic CHF (congestive heart failure) ([place]) 01/30/2019  . History of colonic polyps 04/21/2018  . Barrett's esophagus 04/21/2018  . Barrett's esophagus without dysplasia 04/21/2018  . Rectal bleeding 04/21/2018    Past Surgical History:  Procedure Laterality Date  . BIOPSY  05/24/2018   Procedure: BIOPSY;  Surgeon: Rogene Houston, MD;  [facility];  Service: Endoscopy;;  gastric  . COLONOSCOPY    . COLONOSCOPY N/A 05/24/2018   Procedure: COLONOSCOPY;  Surgeon: Rogene Houston, MD;  Location: [facility];  Service: Endoscopy;  Laterality: N/A;  8:55  . COLONOSCOPY WITH ESOPHAGOGASTRODUODENOSCOPY (EGD)    . CORONARY STENT INTERVENTION N/A 01/26/2019   Procedure: CORONARY STENT INTERVENTION;  Surgeon: Troy Sine, MD;  Location: [place] CV LAB;  Service: Cardiovascular;  Laterality: N/A;  . CORONARY/GRAFT ACUTE MI REVASCULARIZATION N/A 01/26/2019   Procedure: Coronary/Graft Acute MI Revascularization;  Surgeon: Troy Sine, MD;  Location: [place] CV LAB;  Service: Cardiovascular;  Laterality: N/A;  . ESOPHAGOGASTRODUODENOSCOPY N/A 05/24/2018   Procedure: ESOPHAGOGASTRODUODENOSCOPY (EGD);  Surgeon: Rogene Houston, MD;  [facility];  Service: Endoscopy;  Laterality: N/A;  . LEFT HEART CATH AND CORONARY ANGIOGRAPHY N/A 01/26/2019   Procedure: LEFT HEART CATH AND CORONARY ANGIOGRAPHY;  Surgeon: Troy Sine, MD;  Location: [place] CV LAB;  Service: Cardiovascular;  Laterality: N/A;  . MEDIAL PARTIAL KNEE REPLACEMENT     2015  . POLYPECTOMY  05/24/2018   Procedure: POLYPECTOMY;  Surgeon: Rogene Houston, MD;  [facility];  Service: Endoscopy;;  colon  . rt hand injury    . shoulder surgery for bursitis    . TONSILLECTOMY         Family History  Problem Relation Age of Onset  . Colon cancer Neg Hx      Social History   Tobacco Use  . Smoking status: Former Smoker    Packs/day: 0.25    Years: 40.00    Pack years: 10.00    Types: Cigarettes  . Smokeless tobacco: Never Used  Substance Use Topics  . Alcohol use: Yes    Comment: very rare  . Drug use: Never    Home Medications Prior to Admission medications   Medication Sig Start Date End Date Taking? Authorizing Provider  acetaminophen (TYLENOL) 325 MG tablet Take 2 tablets (650 mg total) by mouth every 6 (six) hours as needed for mild pain, fever or headache (or Fever >/= 101). 09/13/19   Roxan Hockey, MD  albuterol (VENTOLIN HFA) 108 (90 Base) MCG/ACT inhaler Inhale 2 puffs into the lungs every 6 (six) hours as needed for wheezing or shortness of breath. 09/13/19   Roxan Hockey, MD  ARIPiprazole (ABILIFY) 15 MG tablet Take 15 mg by mouth at bedtime. 09/01/19   [provider]  aspirin 81 MG chewable tablet Chew 1 tablet (81 mg total) by mouth daily. 10/22/19   Enzo Bi, MD  atorvastatin (LIPITOR) 80 MG tablet Take 1 tablet (80 mg total) by mouth daily at 6 PM. 01/30/19   Almyra Deforest, PA  buPROPion [facility] SR) 150 MG 12 hr tablet Take 150-300 mg by mouth 2 (two) times daily. Take two tablets (300 mg) by mouth every morning and one tablet (150 mg) with lunch 03/27/18   [provider]  carvedilol (COREG) 3.125 MG tablet Take 1 tablet (3.125 mg total) by mouth 2 (two) times daily with a meal. 01/30/19   Almyra Deforest, PA  clopidogrel (PLAVIX) 75 MG tablet Take 75 mg by mouth daily.    [provider]  Eszopiclone 3 MG TABS Take 3 mg by mouth at bedtime. 09/01/19   [provider]  folic acid (FOLVITE) 1 MG tablet Take 1 tablet (1 mg total) by mouth daily. 09/14/19   Roxan Hockey, MD  furosemide (LASIX) 40 MG tablet Take 1 tablet (40 mg total) by mouth 2 (two) times daily. In early morning and early afternoon. 10/21/19 01/19/20  Enzo Bi, MD  guaiFENesin (MUCINEX) 600 MG 12 hr tablet Take 2  tablets (1,200 mg total) by mouth 2 (two) times daily as needed. 10/21/19   Enzo Bi, MD  levothyroxine (SYNTHROID) 125 MCG tablet Take 125 mcg by mouth as needed.    [provider]  Multiple Vitamin (MULTIVITAMIN WITH MINERALS) TABS tablet Take 1 tablet by mouth every morning.    [provider]  nitroGLYCERIN (NITROSTAT) 0.4 MG SL tablet Place 1 tablet (0.4 mg total) under the tongue every 5 (five) minutes x 3 doses as needed for chest pain. 01/30/19   Almyra Deforest, PA  pantoprazole (PROTONIX) 40 MG tablet Take 1 tablet (40 mg total) by mouth daily. 09/13/19 09/12/20  Roxan Hockey, MD  potassium chloride SA (KLOR-CON) 20 MEQ tablet Take 2 tablets (40 mEq total) by mouth daily. 10/21/19 11/20/19  Enzo Bi, MD  QUEtiapine (SEROQUEL) 100 MG tablet Take 50-100 mg by mouth at bedtime.  10/04/19   [provider]  sacubitril-valsartan (ENTRESTO) 49-51 MG Please hold this medication until you follow up with either your primary care doctor or cardiologist, because your blood pressure was on the low side during hospitalization. 10/23/19   Enzo Bi, MD  spironolactone (ALDACTONE) 25 MG tablet Take 0.5 tablets (12.5 mg total) by mouth daily. Patient not taking: Reported on 10/18/2019 08/29/19   Troy Sine, MD  VIIBRYD 20 MG TABS Take 20 mg by mouth daily.  09/05/19   [provider]    Allergies    Patient has no known allergies.  Review of Systems   Review of Systems  Constitutional: Negative for chills, diaphoresis and fever.  Respiratory: Positive for shortness of breath. Negative for cough.   Cardiovascular: Positive for chest pain. Negative for leg swelling.  Gastrointestinal: Negative for abdominal pain, diarrhea, nausea and vomiting.  Neurological: Negative for dizziness, syncope and weakness.  All other systems reviewed and are negative.   Physical Exam Updated Vital Signs BP 108/77 (BP Location: Right Arm)   Pulse 93   Temp 97.8 F (36.6 C)  (Oral)   Resp (!) 24   Ht 5\' 10"  (1.778 m)   Wt 102.1 kg   SpO2 93%   BMI 32.28 kg/m   Physical Exam Vitals and nursing note reviewed.  Constitutional:      General: He is not in acute distress.    Appearance: He is well-developed. He is not diaphoretic.  HENT:     Head: Normocephalic and atraumatic.     Mouth/Throat:     Mouth: Mucous membranes are moist.     Pharynx: Oropharynx is clear.  Eyes:     Conjunctiva/sclera: Conjunctivae normal.  Cardiovascular:     Rate and Rhythm: Normal rate and regular rhythm.     Pulses: Normal pulses.          Radial pulses are 2+ on the right side and 2+ on the left side.       Posterior tibial pulses are 2+ on the right side and 2+ on the left side.     Heart sounds: Normal heart sounds.     Comments: Tactile temperature in the extremities appropriate and equal bilaterally. Pulmonary:     Effort: Pulmonary effort is normal. No respiratory distress.     Breath sounds: Examination of the right-middle field reveals rales. Examination of the left-middle field reveals rales. Examination of the right-lower field reveals rales. Examination of the left-lower field reveals rales. Wheezing and rales present.  Abdominal:     Palpations: Abdomen is soft.     Tenderness: There is no abdominal tenderness. There is no guarding.  Musculoskeletal:     Cervical back: Neck supple.     Right lower leg: No tenderness. Edema present.     Left lower leg: No tenderness. Edema present.  Lymphadenopathy:     Cervical: No cervical adenopathy.  Skin:    General: Skin is warm and dry.  Neurological:     Mental Status: He is alert.  Psychiatric:        Mood and Affect: Mood and affect normal.        Speech: Speech normal.        Behavior: Behavior normal.     ED Results / Procedures / Treatments   Labs (all labs ordered are listed, but only abnormal results are displayed) Labs Reviewed  BASIC METABOLIC PANEL -  Abnormal; Notable for the following  components:      Result Value   Glucose, Bld 125 (*)    Creatinine, Ser 1.34 (*)    GFR calc non Af Amer 57 (*)    All other components within normal limits  CBC - Abnormal; Notable for the following components:   RDW 16.0 (*)    All other components within normal limits  BRAIN NATRIURETIC PEPTIDE - Abnormal; Notable for the following components:   B Natriuretic Peptide 462.0 (*)    All other components within normal limits  POC SARS CORONAVIRUS 2 AG -  ED  TROPONIN I (HIGH SENSITIVITY)  TROPONIN I (HIGH SENSITIVITY)    EKG EKG Interpretation  Date/Time:  Saturday November 12 2019 14:03:15 EST Ventricular Rate:  101 PR Interval:  156 QRS Duration: 140 QT Interval:  384 QTC Calculation: 497 R Axis:   -82 Text Interpretation: Sinus tachycardia Possible Left atrial enlargement Left axis deviation Non-specific intra-ventricular conduction block Inferior infarct , age undetermined Anterolateral infarct , age undetermined Abnormal ECG Nonspecific ST and T wave abnormality No significant change since last tracing Reconfirmed by Varney Biles [id number]) on 11/12/2019 3:33:29 PM   Radiology DG Chest 2 View  Result Date: 11/12/2019 CLINICAL DATA:  short of breath and chest tightness EXAM: CHEST - 2 VIEW COMPARISON:  10/21/2019 FINDINGS: Stable cardiac enlargement. Small to moderate bilateral pleural effusions identified. Diffuse increase interstitial markings throughout both lungs identified compatible with pulmonary edema. The chronic interstitial changes of emphysema are also noted. No airspace consolidation. IMPRESSION: 1. Cardiac enlargement and congestive heart failure. Electronically Signed   By: Kerby Moors M.D.   On: 11/12/2019 14:33    Procedures Procedures (including critical care time)  Medications Ordered in ED Medications  aspirin chewable tablet 324 mg (324 mg Oral Given 11/12/19 1622)  furosemide (LASIX) injection 40 mg (40 mg Intravenous Given 11/12/19 1623)    ED Course   I have reviewed the triage vital signs and the nursing notes.  Pertinent labs & imaging results that were available during my care of the patient were reviewed by me and considered in my medical decision making (see chart for details).  Clinical Course as of Nov 11 1710  Sat Nov 12, 2019  1642 Spoke with Dr. Nehemiah Settle, hospitalist. Requests additional information before admission, including ambulatory SPO2.   [SJ]    Clinical Course User Index [SJ] Cheral Cappucci, Helane Gunther, PA-C   MDM Rules/Calculators/A&P                      Patient presents complaining of shortness of breath and chest discomfort. Rales on exam.  Pulmonary edema on chest x-ray.  Elevated BNP.  I expect the patient will need admission for IV diuresis.  Findings and plan of care discussed with Varney Biles, MD.    End of shift patient care handoff report given to Domenic Moras, PA-C. Plan: Patient will need to ambulate with pulse ox prior to admission.  Please also assure Covid testing is performed.  Final Clinical Impression(s) / ED Diagnoses Final diagnoses:  Acute on chronic congestive heart failure, unspecified heart failure type [facility])    Rx / DC Orders ED Discharge Orders    None       Layla Maw 11/12/19 1715    Davonna Belling, MD 11/13/19 [phone]

## 2019-11-12 NOTE — ED Notes (Signed)
Cardiac monitoring ordered. No cardiac monitors available at this time. Charge RN aware. Will continue to monitor.

## 2019-11-12 NOTE — ED Notes (Signed)
Pt ambulated 4 laps around the nurses station. O2 sats stayed at 93%. Pt was mildly SOB.

## 2019-11-12 NOTE — ED Triage Notes (Signed)
Pt states he woke up this morning feeling very short of breath with some chest tightness.

## 2019-11-13 DIAGNOSIS — Z7289 Other problems related to lifestyle: Secondary | ICD-10-CM | POA: Diagnosis not present

## 2019-11-13 DIAGNOSIS — G4730 Sleep apnea, unspecified: Secondary | ICD-10-CM | POA: Diagnosis present

## 2019-11-13 DIAGNOSIS — E785 Hyperlipidemia, unspecified: Secondary | ICD-10-CM | POA: Diagnosis present

## 2019-11-13 DIAGNOSIS — I255 Ischemic cardiomyopathy: Secondary | ICD-10-CM | POA: Diagnosis present

## 2019-11-13 DIAGNOSIS — N183 Chronic kidney disease, stage 3 unspecified: Secondary | ICD-10-CM | POA: Diagnosis present

## 2019-11-13 DIAGNOSIS — J9601 Acute respiratory failure with hypoxia: Secondary | ICD-10-CM | POA: Diagnosis present

## 2019-11-13 DIAGNOSIS — I2510 Atherosclerotic heart disease of native coronary artery without angina pectoris: Secondary | ICD-10-CM | POA: Diagnosis present

## 2019-11-13 DIAGNOSIS — Z20822 Contact with and (suspected) exposure to covid-19: Secondary | ICD-10-CM | POA: Diagnosis present

## 2019-11-13 DIAGNOSIS — K2270 Barrett's esophagus without dysplasia: Secondary | ICD-10-CM | POA: Diagnosis present

## 2019-11-13 DIAGNOSIS — Z7982 Long term (current) use of aspirin: Secondary | ICD-10-CM | POA: Diagnosis not present

## 2019-11-13 DIAGNOSIS — I252 Old myocardial infarction: Secondary | ICD-10-CM | POA: Diagnosis not present

## 2019-11-13 DIAGNOSIS — F1010 Alcohol abuse, uncomplicated: Secondary | ICD-10-CM | POA: Diagnosis present

## 2019-11-13 DIAGNOSIS — I130 Hypertensive heart and chronic kidney disease with heart failure and stage 1 through stage 4 chronic kidney disease, or unspecified chronic kidney disease: Secondary | ICD-10-CM | POA: Diagnosis present

## 2019-11-13 DIAGNOSIS — I5043 Acute on chronic combined systolic (congestive) and diastolic (congestive) heart failure: Secondary | ICD-10-CM | POA: Diagnosis not present

## 2019-11-13 DIAGNOSIS — Z79899 Other long term (current) drug therapy: Secondary | ICD-10-CM | POA: Diagnosis not present

## 2019-11-13 DIAGNOSIS — I509 Heart failure, unspecified: Secondary | ICD-10-CM

## 2019-11-13 DIAGNOSIS — F329 Major depressive disorder, single episode, unspecified: Secondary | ICD-10-CM | POA: Diagnosis present

## 2019-11-13 DIAGNOSIS — Z87891 Personal history of nicotine dependence: Secondary | ICD-10-CM | POA: Diagnosis not present

## 2019-11-13 DIAGNOSIS — Z955 Presence of coronary angioplasty implant and graft: Secondary | ICD-10-CM | POA: Diagnosis not present

## 2019-11-13 DIAGNOSIS — Z79890 Hormone replacement therapy (postmenopausal): Secondary | ICD-10-CM | POA: Diagnosis not present

## 2019-11-13 DIAGNOSIS — J449 Chronic obstructive pulmonary disease, unspecified: Secondary | ICD-10-CM | POA: Diagnosis present

## 2019-11-13 DIAGNOSIS — Z7902 Long term (current) use of antithrombotics/antiplatelets: Secondary | ICD-10-CM | POA: Diagnosis not present

## 2019-11-13 DIAGNOSIS — E039 Hypothyroidism, unspecified: Secondary | ICD-10-CM | POA: Diagnosis present

## 2019-11-13 DIAGNOSIS — I5023 Acute on chronic systolic (congestive) heart failure: Secondary | ICD-10-CM | POA: Diagnosis present

## 2019-11-13 LAB — CBC
HCT: 38.3 % — ABNORMAL LOW (ref 39.0–52.0)
Hemoglobin: 12.3 g/dL — ABNORMAL LOW (ref 13.0–17.0)
MCH: 31 pg (ref 26.0–34.0)
MCHC: 32.1 g/dL (ref 30.0–36.0)
MCV: 96.5 fL (ref 80.0–100.0)
Platelets: 176 10*3/uL (ref 150–400)
RBC: 3.97 MIL/uL — ABNORMAL LOW (ref 4.22–5.81)
RDW: 15.9 % — ABNORMAL HIGH (ref 11.5–15.5)
WBC: 7.4 10*3/uL (ref 4.0–10.5)
nRBC: 0 % (ref 0.0–0.2)

## 2019-11-13 LAB — BASIC METABOLIC PANEL
Anion gap: 12 (ref 5–15)
BUN: 19 mg/dL (ref 8–23)
CO2: 24 mmol/L (ref 22–32)
Calcium: 9.6 mg/dL (ref 8.9–10.3)
Chloride: 104 mmol/L (ref 98–111)
Creatinine, Ser: 1.25 mg/dL — ABNORMAL HIGH (ref 0.61–1.24)
GFR calc Af Amer: >60 mL/min (ref >60)
GFR calc non Af Amer: >60 mL/min (ref >60)
Glucose, Bld: 84 mg/dL (ref 70–99)
Potassium: 3.3 mmol/L — ABNORMAL LOW (ref 3.5–5.1)
Sodium: 140 mmol/L (ref 135–145)

## 2019-11-13 LAB — COMPREHENSIVE METABOLIC PANEL
ALT: 16 U/L (ref 0–44)
AST: 14 U/L — ABNORMAL LOW (ref 15–41)
Albumin: 3.8 g/dL (ref 3.5–5.0)
Alkaline Phosphatase: 69 U/L (ref 38–126)
Anion gap: 10 (ref 5–15)
BUN: 18 mg/dL (ref 8–23)
CO2: 24 mmol/L (ref 22–32)
Calcium: 9.5 mg/dL (ref 8.9–10.3)
Chloride: 107 mmol/L (ref 98–111)
Creatinine, Ser: 1.19 mg/dL (ref 0.61–1.24)
GFR calc Af Amer: >60 mL/min (ref >60)
GFR calc non Af Amer: >60 mL/min (ref >60)
Glucose, Bld: 89 mg/dL (ref 70–99)
Potassium: 3.3 mmol/L — ABNORMAL LOW (ref 3.5–5.1)
Sodium: 141 mmol/L (ref 135–145)
Total Bilirubin: 1.8 mg/dL — ABNORMAL HIGH (ref 0.3–1.2)
Total Protein: 6.6 g/dL (ref 6.5–8.1)

## 2019-11-13 LAB — MAGNESIUM: Magnesium: 2 mg/dL (ref 1.7–2.4)

## 2019-11-13 LAB — SARS CORONAVIRUS 2 (TAT 6-24 HRS): SARS Coronavirus 2: NEGATIVE

## 2019-11-13 LAB — PHOSPHORUS: Phosphorus: 3.9 mg/dL (ref 2.5–4.6)

## 2019-11-13 MED ORDER — ORAL CARE MOUTH RINSE
15.0000 mL | Freq: Two times a day (BID) | OROMUCOSAL | Status: DC
  Administered 2019-11-14: 15 mL via OROMUCOSAL

## 2019-11-13 MED ORDER — CHLORHEXIDINE GLUCONATE CLOTH 2 % EX PADS
6.0000 | MEDICATED_PAD | Freq: Every day | CUTANEOUS | Status: DC
  Administered 2019-11-14: 6 via TOPICAL

## 2019-11-13 NOTE — Progress Notes (Signed)
Subjective: Patient was admitted yesterday with shortness of breath and chest tightness. He feels better this morning.  Serial troponin levels have been unremarkable.  Objective: Vital signs in last 24 hours: Temp:  [97.8 F (36.6 C)-98.5 F (36.9 C)] 98 F (36.7 C) (01/03 0743) Pulse Rate:  [72-93] 88 (01/03 0800) Resp:  [10-26] 13 (01/03 0800) BP: (94-112)/(60-88) 100/74 (01/03 0800) SpO2:  [88 %-96 %] 95 % (01/03 0800) Weight:  [100.4 kg-102.1 kg] 100.4 kg (01/02 2300) He is comfortable at rest and not in any respiratory distress.  Vital signs are stable.  He is saturating well.  Lung fields actually are clinically clear with no evidence of wheezing or crackles.  He  does not have any gallop rhythm and there is no peripheral pitting edema. Intake/Output from previous day: 01/02 0701 - 01/03 0700 In: 3 [I.V.:3] Out: -  Intake/Output this shift: No intake/output data recorded.  Recent Labs    11/12/19 1511 11/13/19 0112  HGB 13.8 12.3*   Recent Labs    11/12/19 1511 11/13/19 0112  WBC 8.1 7.4  RBC 4.41 3.97*  HCT 43.3 38.3*  PLT 201 176   Recent Labs    11/13/19 0112 11/13/19 0438  NA 141 140  K 3.3* 3.3*  CL 107 104  CO2 24 24  BUN 18 19  CREATININE 1.19 1.25*  GLUCOSE 89 84  CALCIUM 9.5 9.6     Assessment/Plan: 1.  Acute exacerbation of CHF.  Ejection fraction 25 to 30%.  Chest x-ray consistent with CHF.  Continue with a higher dose of intravenous Lasix and add spironolactone.  Monitor electrolytes and repeat chest x-ray tomorrow. 2.  Hypertension.  Stable. 3.  COPD.  Stable.  Continue with bronchodilators.    Cheryle Dark C Aleli Navedo 11/13/2019, 8:52 AM

## 2019-11-14 ENCOUNTER — Inpatient Hospital Stay (HOSPITAL_COMMUNITY): Payer: BC Managed Care – PPO

## 2019-11-14 DIAGNOSIS — I5043 Acute on chronic combined systolic (congestive) and diastolic (congestive) heart failure: Secondary | ICD-10-CM

## 2019-11-14 LAB — BASIC METABOLIC PANEL
Anion gap: 11 (ref 5–15)
BUN: 26 mg/dL — ABNORMAL HIGH (ref 8–23)
CO2: 25 mmol/L (ref 22–32)
Calcium: 9.7 mg/dL (ref 8.9–10.3)
Chloride: 106 mmol/L (ref 98–111)
Creatinine, Ser: 1.35 mg/dL — ABNORMAL HIGH (ref 0.61–1.24)
GFR calc Af Amer: >60 mL/min (ref >60)
GFR calc non Af Amer: 56 mL/min — ABNORMAL LOW (ref >60)
Glucose, Bld: 93 mg/dL (ref 70–99)
Potassium: 3.5 mmol/L (ref 3.5–5.1)
Sodium: 142 mmol/L (ref 135–145)

## 2019-11-14 NOTE — Progress Notes (Signed)
SATURATION QUALIFICATIONS: (This note is used to comply with regulatory documentation for home oxygen)  Patient Saturations on Room Air at Rest = 97%  Patient Saturations on Room Air while Ambulating = 92%  Patient Saturations on 2 Liters of oxygen while Ambulating = n/a  Please briefly explain why patient needs home oxygen: Patient is not in need of oxygen as he was able to maintain oxygen saturation while ambulating.  Celestia Khat, RN

## 2019-11-14 NOTE — Progress Notes (Signed)
Patient is to be discharged home and in stable condition. Patient's IV and telemetry removed, WNL. Patient given discharge instructions and verbalized understanding. Patient accompanied by staff to an awaiting vehicle.  Celestia Khat, RN

## 2019-11-14 NOTE — Discharge Summary (Signed)
Physician Discharge Summary  Tony Alvarez [id number] DOB: 12-Aug-1956 DOA: 11/12/2019  PCP: [facility]  Admit date: 11/12/2019  Discharge date: 11/14/2019  Admitted From:Home  Disposition:  Home  Recommendations for Outpatient Follow-up:  1. Follow up with PCP in 1-2 weeks 2. Follow up with his Cardiologist in 2 weeks 3. Continue on home lasix as prior and avoid sodium and excessive fluid 4. Monitor daily weights closely  Home Health:None  Equipment/Devices:None  Discharge Condition:Stable  CODE STATUS: Full  Diet recommendation: Heart Healthy with 1569mL fluid restriction  Brief/Interim Summary: Per HPI: Tony Alvarez is a 64 y.o. male with a history of coronary artery disease with STEMI in 01/2019 and drug-eluting stent in proximal LAD, CHF with ischemic cardiomyopathy and EF of 25 to 30% on 08/2019, hypertension, hypothyroidism, sleep apnea, history of alcohol abuse.  Patient seen for shortness of breath and chest tightness that started earlier today.  He has been taking his Lasix at home as prescribed.  It appears that he is also prescribed spironolactone, however he has not begun taking it.  Denies edema, has dry cough. Shortness of breath worse with exertion and improved with rest.  No other palliating or provoking factors.  Does feel that his breathing is slightly better since being here.  Patient was admitted with acute systolic CHF decompensation in the setting of EF of 25-30%.  He has done quite well with diuresis and initially had some acute hypoxemic respiratory failure that is now resolved.  Unfortunately, his weights and urine outputs have not been accurately recorded in our system, but he feels well enough to go home and would like to go home today and follow-up with his cardiologist in the outpatient setting.  He has had no other acute events during this brief course of admission  Discharge Diagnoses:  Active Problems:   Alcohol abuse   Ischemic  cardiomyopathy   CAD (coronary artery disease)   Hypothyroid   COPD (chronic obstructive pulmonary disease) (HCC)   Sleep apnea   Hypertension   Acute exacerbation of CHF (congestive heart failure) (HCC)   Acute respiratory failure with hypoxia (HCC)   CKD (chronic kidney disease) stage 3, GFR 30-59 ml/min   CHF (congestive heart failure) ([place])  Principal discharge diagnosis: Acute hypoxemic respiratory failure secondary to acute systolic CHF exacerbation.  Discharge Instructions  Discharge Instructions    Diet - low sodium heart healthy   Complete by: As directed    Increase activity slowly   Complete by: As directed      Allergies as of 11/14/2019   No Known Allergies     Medication List    STOP taking these medications   Entresto 49-51 MG Generic drug: sacubitril-valsartan     TAKE these medications   acetaminophen 325 MG tablet Commonly known as: TYLENOL Take 2 tablets (650 mg total) by mouth every 6 (six) hours as needed for mild pain, fever or headache (or Fever >/= 101).   albuterol 108 (90 Base) MCG/ACT inhaler Commonly known as: VENTOLIN HFA Inhale 2 puffs into the lungs every 6 (six) hours as needed for wheezing or shortness of breath.   ARIPiprazole 15 MG tablet Commonly known as: ABILIFY Take 15 mg by mouth at bedtime.   aspirin 81 MG chewable tablet Chew 1 tablet (81 mg total) by mouth daily.   atorvastatin 80 MG tablet Commonly known as: LIPITOR Take 1 tablet (80 mg total) by mouth daily at 6 PM.   buPROPion 150 MG 12 hr  tablet Commonly known as: WELLBUTRIN SR Take 150-300 mg by mouth 2 (two) times daily. Take two tablets (300 mg) by mouth every morning and one tablet (150 mg) with lunch   carvedilol 3.125 MG tablet Commonly known as: COREG Take 1 tablet (3.125 mg total) by mouth 2 (two) times daily with a meal.   clopidogrel 75 MG tablet Commonly known as: PLAVIX Take 75 mg by mouth daily.   Eszopiclone 3 MG Tabs Take 3 mg by mouth at  bedtime.   folic acid 1 MG tablet Commonly known as: FOLVITE Take 1 tablet (1 mg total) by mouth daily.   furosemide 40 MG tablet Commonly known as: LASIX Take 1 tablet (40 mg total) by mouth 2 (two) times daily. In early morning and early afternoon.   guaiFENesin 600 MG 12 hr tablet Commonly known as: MUCINEX Take 2 tablets (1,200 mg total) by mouth 2 (two) times daily as needed.   levothyroxine 125 MCG tablet Commonly known as: SYNTHROID Take 125 mcg by mouth daily as needed (for thyroid levels).   multivitamin with minerals Tabs tablet Take 1 tablet by mouth every morning.   nitroGLYCERIN 0.4 MG SL tablet Commonly known as: NITROSTAT Place 1 tablet (0.4 mg total) under the tongue every 5 (five) minutes x 3 doses as needed for chest pain.   pantoprazole 40 MG tablet Commonly known as: Protonix Take 1 tablet (40 mg total) by mouth daily.   potassium chloride SA 20 MEQ tablet Commonly known as: KLOR-CON Take 2 tablets (40 mEq total) by mouth daily.   QUEtiapine 100 MG tablet Commonly known as: SEROQUEL Take 50-100 mg by mouth at bedtime.   spironolactone 25 MG tablet Commonly known as: ALDACTONE Take 0.5 tablets (12.5 mg total) by mouth daily.   Viibryd 20 MG Tabs Generic drug: Vilazodone HCl Take 20 mg by mouth daily.      Follow-up Information    A, [facility] Follow up in 1 week(s).   Contact information: [address] [place] [address] [phone]        Troy Sine, MD Follow up in 2 week(s).   Specialty: Cardiology Contact information: [address] [place] [address] [phone]          No Known Allergies  Consultations:  None   Procedures/Studies: DG Chest 1 View  Result Date: 10/21/2019 CLINICAL DATA:  Shortness of breath EXAM: CHEST  1 VIEW COMPARISON:  10/17/2019 FINDINGS: Stable cardiomegaly. Pulmonary vascular congestion with diffuse interstitial opacities. Mild patchy alveolar  opacities have decreased from prior. Probable trace bilateral pleural effusions. No pneumothorax. IMPRESSION: 1. Cardiomegaly with pulmonary vascular congestion and diffuse interstitial edema. 2. Mild patchy alveolar opacities have slightly improved from prior. Electronically Signed   By: Davina Poke M.D.   On: 10/21/2019 21:36   DG Chest 2 View  Result Date: 11/12/2019 CLINICAL DATA:  short of breath and chest tightness EXAM: CHEST - 2 VIEW COMPARISON:  10/21/2019 FINDINGS: Stable cardiac enlargement. Small to moderate bilateral pleural effusions identified. Diffuse increase interstitial markings throughout both lungs identified compatible with pulmonary edema. The chronic interstitial changes of emphysema are also noted. No airspace consolidation. IMPRESSION: 1. Cardiac enlargement and congestive heart failure. Electronically Signed   By: Kerby Moors M.D.   On: 11/12/2019 14:33   DG Chest Port 1 View  Result Date: 11/14/2019 CLINICAL DATA:  Shortness of breath and chest tightness EXAM: PORTABLE CHEST 1 VIEW COMPARISON:  11/12/2019 FINDINGS: Cardiomegaly and interstitial opacity  similar to prior. No definite effusion. No air bronchograms or pneumothorax. IMPRESSION: Interstitial opacity from emphysema and possibly mild edema. Cardiomegaly. Electronically Signed   By: Monte Fantasia M.D.   On: 11/14/2019 05:44   DG Chest Portable 1 View  Result Date: 10/17/2019 CLINICAL DATA:  Shortness of breath EXAM: PORTABLE CHEST 1 VIEW COMPARISON:  Radiograph 09/11/2019 FINDINGS: There is diffuse hazy opacity throughout both lungs with cephalized, indistinct pulmonary vascularity and likely trace left effusion tracking in the fissure. More patchy airspace opacities are present in the periphery of the lungs. Cardiomegaly is similar to slightly increased from comparison portable radiograph. Calcified aorta is similar to prior. No pneumothorax. No acute osseous or soft tissue abnormality. Degenerative changes  are present in the imaged spine and shoulders. IMPRESSION: 1. Findings consistent with moderate to severe CHF. 2. Cardiomegaly similar to slightly increased from prior. 3. More patchy airspace opacities in the periphery of the lungs could represent alveolar edema or pneumonia. 4. Trace left pleural effusion. Aortic Atherosclerosis (ICD10-I70.0). Electronically Signed   By: Lovena Le M.D.   On: 10/17/2019 19:31     Discharge Exam: Vitals:   11/14/19 0800 11/14/19 1000  BP: 94/69   Pulse: 89   Resp: (!) 22   Temp:    SpO2: 96% 91%   Vitals:   11/14/19 0500 11/14/19 0600 11/14/19 0800 11/14/19 1000  BP: 91/67 (!) 84/63 94/69   Pulse: 76 84 89   Resp: 18 (!) 25 (!) 22   Temp:      TempSrc:      SpO2: 96% 93% 96% 91%  Weight: 100.7 kg     Height:        General: Pt is alert, awake, not in acute distress Cardiovascular: RRR, S1/S2 +, no rubs, no gallops Respiratory: CTA bilaterally, no wheezing, no rhonchi Abdominal: Soft, NT, ND, bowel sounds + Extremities: no edema, no cyanosis    The results of significant diagnostics from this hospitalization (including imaging, microbiology, ancillary and laboratory) are listed below for reference.     Microbiology: Recent Results (from the past 240 hour(s))  SARS CORONAVIRUS 2 (TAT 6-24 HRS) Nasopharyngeal Nasopharyngeal Swab     Status: None   Collection Time: 11/12/19  6:45 PM   Specimen: Nasopharyngeal Swab  Result Value Ref Range Status   SARS Coronavirus 2 NEGATIVE NEGATIVE Final    Comment: (NOTE) SARS-CoV-2 target nucleic acids are NOT DETECTED. The SARS-CoV-2 RNA is generally detectable in upper and lower respiratory specimens during the acute phase of infection. Negative results do not preclude SARS-CoV-2 infection, do not rule out co-infections with other pathogens, and should not be used as the sole basis for treatment or other patient management decisions. Negative results must be combined with clinical  observations, patient history, and epidemiological information. The expected result is Negative. Fact Sheet for Patients: SugarRoll.be Fact Sheet for Healthcare Providers: https://www.woods-mathews.com/ This test is not yet approved or cleared by the [place] FDA and  has been authorized for detection and/or diagnosis of SARS-CoV-2 by FDA under an Emergency Use Authorization (EUA). This EUA will remain  in effect (meaning this test can be used) for the duration of the COVID-19 declaration under Section 56 4(b)(1) of the Act, 21 U.S.C. section 360bbb-3(b)(1), unless the authorization is terminated or revoked sooner. Performed at [facility], [place] [address] [place], [place] [address]      Labs: BNP (last 3 results) Recent Labs    10/17/19 1847 10/21/19 2220 11/12/19 1511  BNP 1,622.0* 523.0* 123XX123*   Basic Metabolic Panel: Recent Labs  Lab 11/12/19 1511 11/13/19 0112 11/13/19 0438 11/14/19 0507  NA 142 141 140 142  K 3.9 3.3* 3.3* 3.5  CL 106 107 104 106  CO2 25 24 24 25   GLUCOSE 125* 89 84 93  BUN 18 18 19  26*  CREATININE 1.34* 1.19 1.25* 1.35*  CALCIUM 9.8 9.5 9.6 9.7  MG  --  2.0  --   --   PHOS  --  3.9  --   --    Liver Function Tests: Recent Labs  Lab 11/13/19 0112  AST 14*  ALT 16  ALKPHOS 69  BILITOT 1.8*  PROT 6.6  ALBUMIN 3.8   No results for input(s): LIPASE, AMYLASE in the last 168 hours. No results for input(s): AMMONIA in the last 168 hours. CBC: Recent Labs  Lab 11/12/19 1511 11/13/19 0112  WBC 8.1 7.4  HGB 13.8 12.3*  HCT 43.3 38.3*  MCV 98.2 96.5  PLT 201 176   Cardiac Enzymes: No results for input(s): CKTOTAL, CKMB, CKMBINDEX, TROPONINI in the last 168 hours. BNP: Invalid input(s): POCBNP CBG: No results for input(s): GLUCAP in the last 168 hours. D-Dimer No results for input(s): DDIMER in the last 72 hours. Hgb A1c No results for input(s): HGBA1C in the last 72  hours. Lipid Profile No results for input(s): CHOL, HDL, LDLCALC, TRIG, CHOLHDL, LDLDIRECT in the last 72 hours. Thyroid function studies No results for input(s): TSH, T4TOTAL, T3FREE, THYROIDAB in the last 72 hours.  Invalid input(s): FREET3 Anemia work up No results for input(s): VITAMINB12, FOLATE, FERRITIN, TIBC, IRON, RETICCTPCT in the last 72 hours. Urinalysis No results found for: COLORURINE, APPEARANCEUR, [place], [place], [place], [place], [place], [place], PROTEINUR, UROBILINOGEN, NITRITE, LEUKOCYTESUR Sepsis Labs Invalid input(s): PROCALCITONIN,  WBC,  LACTICIDVEN Microbiology Recent Results (from the past 240 hour(s))  SARS CORONAVIRUS 2 (TAT 6-24 HRS) Nasopharyngeal Nasopharyngeal Swab     Status: None   Collection Time: 11/12/19  6:45 PM   Specimen: Nasopharyngeal Swab  Result Value Ref Range Status   SARS Coronavirus 2 NEGATIVE NEGATIVE Final    Comment: (NOTE) SARS-CoV-2 target nucleic acids are NOT DETECTED. The SARS-CoV-2 RNA is generally detectable in upper and lower respiratory specimens during the acute phase of infection. Negative results do not preclude SARS-CoV-2 infection, do not rule out co-infections with other pathogens, and should not be used as the sole basis for treatment or other patient management decisions. Negative results must be combined with clinical observations, patient history, and epidemiological information. The expected result is Negative. Fact Sheet for Patients: SugarRoll.be Fact Sheet for Healthcare Providers: https://www.woods-mathews.com/ This test is not yet approved or cleared by the [place] FDA and  has been authorized for detection and/or diagnosis of SARS-CoV-2 by FDA under an Emergency Use Authorization (EUA). This EUA will remain  in effect (meaning this test can be used) for the duration of the COVID-19 declaration under Section 56 4(b)(1) of the Act, 21 U.S.C. section  360bbb-3(b)(1), unless the authorization is terminated or revoked sooner. Performed at [facility], [place] [address], [place], [place] [address]      Time coordinating discharge: 35 minutes  SIGNED:   Rodena Goldmann, DO [facility] 11/14/2019, 11:49 AM  If 7PM-7AM, please contact night-coverage [email or web address]

## 2019-11-22 ENCOUNTER — Encounter (INDEPENDENT_AMBULATORY_CARE_PROVIDER_SITE_OTHER): Payer: Self-pay

## 2019-11-22 ENCOUNTER — Ambulatory Visit: Payer: BC Managed Care – PPO | Admitting: Cardiovascular Disease

## 2019-11-22 ENCOUNTER — Other Ambulatory Visit: Payer: Self-pay

## 2019-11-22 ENCOUNTER — Encounter: Payer: Self-pay | Admitting: Cardiovascular Disease

## 2019-11-22 VITALS — BP 100/78 | HR 90 | Temp 97.3°F | Ht 70.0 in | Wt 235.0 lb

## 2019-11-22 DIAGNOSIS — I2510 Atherosclerotic heart disease of native coronary artery without angina pectoris: Secondary | ICD-10-CM | POA: Diagnosis not present

## 2019-11-22 DIAGNOSIS — I10 Essential (primary) hypertension: Secondary | ICD-10-CM

## 2019-11-22 DIAGNOSIS — I2102 ST elevation (STEMI) myocardial infarction involving left anterior descending coronary artery: Secondary | ICD-10-CM | POA: Diagnosis not present

## 2019-11-22 DIAGNOSIS — E785 Hyperlipidemia, unspecified: Secondary | ICD-10-CM

## 2019-11-22 DIAGNOSIS — Z79899 Other long term (current) drug therapy: Secondary | ICD-10-CM

## 2019-11-22 DIAGNOSIS — E039 Hypothyroidism, unspecified: Secondary | ICD-10-CM

## 2019-11-22 DIAGNOSIS — I255 Ischemic cardiomyopathy: Secondary | ICD-10-CM | POA: Diagnosis not present

## 2019-11-22 MED ORDER — CARVEDILOL 3.125 MG PO TABS: ORAL_TABLET | ORAL | 3 refills | Status: DC

## 2019-11-22 MED ORDER — DIGOXIN 125 MCG PO TABS: ORAL_TABLET | ORAL | 3 refills | Status: DC

## 2019-11-22 NOTE — Progress Notes (Signed)
Cardiology Office Note    Date:  11/24/2019   ID:  Tony Alvarez, DOB 27-Apr-1956, MRN [id number]  PCP:  [facility]  Cardiologist:  Shelva Majestic, MD   F/U evaluation  History of Present Illness:  Tony Alvarez is a 64 y.o. male who presents for a 2 month F/U cardiology evaluation.  Tony Alvarez has a history of hypertension and hyperlipidemia who developed new onset acute chest pain on January 26, 2019 which radiated to his back while raking leaves.  His chest pain persisted and he ultimately presented to any [facility] emergency room for evaluation.  His ECG showed anterior/anterolateral Q waves with ST elevation in inferior Q waves.  He was transported to [facility] in the setting of an acute anterior ST segment elevation myocardial infarction and underwent emergent catheterization by me which demonstrated total occlusion of his proximal LAD immediately after a large dilated aneurysmal segment in the proximal LAD.  He had almost a common ostium left main with immediate trifurcation into the LAD, ramus and moderate-sized circumflex vessel.  The circumflex vessel had 20% proximal narrowing.  The RCA had nonobstructive stenoses of 20% proximally 40% mid and 30% at the acute margin.  LVEDP was 22 mm.  He underwent successful acute intervention with PTCA and ultimate insertion of a 3.0 x 22 mm Resolute Onyx stent resulting in TIMI-3 flow and residual narrowing of 0%.  Once opened, the LAD also had 20% mid stenosis and 40% distal smooth stenoses and wrapped around the apex.  I had not seen the patient since doing his intervention.  A subsequent echo Doppler study showed an EF of 35 to 40% with severe akinesis of the left ventricular mid anterior anteroseptal wall, anterior wall and apical segment as well as inferoapical wall and there was evidence for small fixed thrombus in the anterior apical wall of the left ventricle.  Apparently he was placed on Xarelto for his LV thrombus.  He also had  delirium concerning for alcohol withdrawal during his hospitalization and received benzodiazepine.    Subsequently, he has had issues with shortness breath and was evaluated in June 2020 with dyspnea.  COVID 19 was negative.  He had had a cough prior to presentation.  CT scan was negative for PE.  He was noted to have LVH as well as emphysema and a left lower lobe opacity suggestive of pneumonia versus atelectasis or scarring.  He had increased interstitial markings.  He was treated with doxycycline.  He also was treated with IV Lasix.    He was evaluated on May 17, 2019 by Wynona Luna and at that time continued to be on triple drug therapy with aspirin, Plavix, and Brilinta.  He has not yet had a follow-up echo Doppler study.  He was currently on carvedilol 3.125 mg twice a day, Entresto 24/26 mg twice a day, furosemide 20 mg daily, and spironolactone 25 mg daily.  He also is on levothyroxine for hypothyroidism.  He admits to occasional shortness of breath with activity.  There also is some residual right ankle edema.    I  saw him in August 2020.  To reduce bleed risk I recommended he discontinue aspirin. I recommended he reduce his Lasix and only take as needed for swelling to allow for further titration of Entresto to 49/51 mg twice a day.  He underwent an echo Doppler study in August 17, 2019 which continued to show an EF at 25 to 30% with grade 2 diastolic dysfunction.  There was akinesis of the mid distal anteroseptal wall, apex and apical lateral regions.  Definity contrast did not reveal any evidence for apical thrombi.  I last saw him on August 29, 2019 at which time he denied  any recurrent episodes of chest pain.  Apparently he never stopped his furosemide and has continued to take this 20 mg every morning.  He continues to be on Xarelto and Plavix.  He is on Entresto 49/51 mg twice a day, spironolactone 25 mg in the morning, and carvedilol 3.125 mg twice a day.  He is on  atorvastatin 80 mg for hyperlipidemia and levothyroxine 125 mcg for hypothyroidism.   Since I last saw him, he apparently has had several hospitalizations in November, December 2020 with his last on January 2 through November 14, 2019.  He has had issues with low blood pressure and apparently is no longer on Entresto.  Current medications now include carvedilol 3.125 mg twice a day, furosemide 40 mg twice a day.  He has had issues with leg edema.  He also is on spironolactone 12.5 mg daily.  He continues to be on atorvastatin 80 mg with target LDL less than 70.  In addition he is also on Abilify 15 mg in addition to Wellbutrin SR for depression.  He presents for evaluation.  Past Medical History:  Diagnosis Date   Barrett's esophagus 04/21/2018   CAD (coronary artery disease)    a. s/p STEMI in 01/2019 with DES to proximal LAD   CHF (congestive heart failure) ([place])    a. EF 35-40% by echo in 01/2019, at 25-30% by repeat echo in 05/2019 and 08/2019   Depression    High cholesterol    Hypertension    Hypothyroid    Sleep apnea    wears CPAP at home    Past Surgical History:  Procedure Laterality Date   BIOPSY  05/24/2018   Procedure: BIOPSY;  Surgeon: Rogene Houston, MD;  [facility];  Service: Endoscopy;;  gastric   COLONOSCOPY     COLONOSCOPY N/A 05/24/2018   Procedure: COLONOSCOPY;  Surgeon: Rogene Houston, MD;  [facility];  Service: Endoscopy;  Laterality: N/A;  8:55   COLONOSCOPY WITH ESOPHAGOGASTRODUODENOSCOPY (EGD)     CORONARY STENT INTERVENTION N/A 01/26/2019   Procedure: CORONARY STENT INTERVENTION;  Surgeon: Troy Sine, MD;  Location: [place] [facility];  Service: Cardiovascular;  Laterality: N/A;   CORONARY/GRAFT ACUTE MI REVASCULARIZATION N/A 01/26/2019   Procedure: Coronary/Graft Acute MI Revascularization;  Surgeon: Troy Sine, MD;  Location: [place] [facility];  Service: Cardiovascular;  Laterality: N/A;    ESOPHAGOGASTRODUODENOSCOPY N/A 05/24/2018   Procedure: ESOPHAGOGASTRODUODENOSCOPY (EGD);  Surgeon: Rogene Houston, MD;  [facility];  Service: Endoscopy;  Laterality: N/A;   LEFT HEART CATH AND CORONARY ANGIOGRAPHY N/A 01/26/2019   Procedure: LEFT HEART CATH AND CORONARY ANGIOGRAPHY;  Surgeon: Troy Sine, MD;  Location: [place] [facility];  Service: Cardiovascular;  Laterality: N/A;   MEDIAL PARTIAL KNEE REPLACEMENT     2015   POLYPECTOMY  05/24/2018   Procedure: POLYPECTOMY;  Surgeon: Rogene Houston, MD;  [facility];  Service: Endoscopy;;  colon   rt hand injury     shoulder surgery for bursitis     TONSILLECTOMY      Current Medications: Outpatient Medications Prior to Visit  Medication Sig Dispense Refill   acetaminophen (TYLENOL) 325 MG tablet Take 2 tablets (650 mg total) by  mouth every 6 (six) hours as needed for mild pain, fever or headache (or Fever >/= 101). 12 tablet 0   albuterol (VENTOLIN HFA) 108 (90 Base) MCG/ACT inhaler Inhale 2 puffs into the lungs every 6 (six) hours as needed for wheezing or shortness of breath. 18 g 1   ARIPiprazole (ABILIFY) 15 MG tablet Take 15 mg by mouth at bedtime.     aspirin 81 MG chewable tablet Chew 1 tablet (81 mg total) by mouth daily.     atorvastatin (LIPITOR) 80 MG tablet Take 1 tablet (80 mg total) by mouth daily at 6 PM. 90 tablet 3   buPROPion (WELLBUTRIN SR) 150 MG 12 hr tablet Take 150-300 mg by mouth 2 (two) times daily. Take two tablets (300 mg) by mouth every morning and one tablet (150 mg) with lunch  0   clopidogrel (PLAVIX) 75 MG tablet Take 75 mg by mouth daily.     Eszopiclone 3 MG TABS Take 3 mg by mouth at bedtime.     folic acid (FOLVITE) 1 MG tablet Take 1 tablet (1 mg total) by mouth daily. 30 tablet 2   furosemide (LASIX) 40 MG tablet Take 1 tablet (40 mg total) by mouth 2 (two) times daily. In early morning and early afternoon. 60 tablet 2   guaiFENesin (MUCINEX) 600 MG 12  hr tablet Take 2 tablets (1,200 mg total) by mouth 2 (two) times daily as needed. 20 tablet 0   levothyroxine (SYNTHROID) 125 MCG tablet Take 125 mcg by mouth daily as needed (for thyroid levels).      Multiple Vitamin (MULTIVITAMIN WITH MINERALS) TABS tablet Take 1 tablet by mouth every morning.     nitroGLYCERIN (NITROSTAT) 0.4 MG SL tablet Place 1 tablet (0.4 mg total) under the tongue every 5 (five) minutes x 3 doses as needed for chest pain. 25 tablet 3   pantoprazole (PROTONIX) 40 MG tablet Take 1 tablet (40 mg total) by mouth daily. 30 tablet 1   QUEtiapine (SEROQUEL) 100 MG tablet Take 50-100 mg by mouth at bedtime.      spironolactone (ALDACTONE) 25 MG tablet Take 0.5 tablets (12.5 mg total) by mouth daily. 45 tablet 1   VIIBRYD 20 MG TABS Take 20 mg by mouth daily.      carvedilol (COREG) 3.125 MG tablet Take 1 tablet (3.125 mg total) by mouth 2 (two) times daily with a meal. 60 tablet 5   potassium chloride SA (KLOR-CON) 20 MEQ tablet Take 2 tablets (40 mEq total) by mouth daily. 60 tablet 0   No facility-administered medications prior to visit.     Allergies:   Patient has no known allergies.   Social History   Socioeconomic History   Marital status: Widowed    Spouse name: Not on file   Number of children: Not on file   Years of education: Not on file   Highest education level: Not on file  Occupational History   Not on file  Tobacco Use   Smoking status: Former Smoker    Packs/day: 0.25    Years: 40.00    Pack years: 10.00    Types: Cigarettes   Smokeless tobacco: Never Used  Substance and Sexual Activity   Alcohol use: Yes    Comment: very rare   Drug use: Never   Sexual activity: Not on file  Other Topics Concern   Not on file  Social History Narrative   Not on file   Social Determinants of Health   Financial  Resource Strain:    Difficulty of Paying Living Expenses: Not on file  Food Insecurity:    Worried About [place] in the Last Year: Not on file   Ran Out of Food in the Last Year: Not on file  Transportation Needs:    Lack of Transportation (Medical): Not on file   Lack of Transportation (Non-Medical): Not on file  Physical Activity:    Days of Exercise per Week: Not on file   Minutes of Exercise per Session: Not on file  Stress:    Feeling of Stress : Not on file  Social Connections:    Frequency of Communication with Friends and Family: Not on file   Frequency of Social Gatherings with Friends and Family: Not on file   Attends Religious Services: Not on file   Active Member of Clubs or Organizations: Not on file   Attends Archivist Meetings: Not on file   Marital Status: Not on file     Family History:  The patient's is negative for colon CA  ROS General: Negative; No fevers, chills, or night sweats;  HEENT: Negative; No changes in vision or hearing, sinus congestion, difficulty swallowing Pulmonary: Negative; No cough, wheezing, shortness of breath, hemoptysis Cardiovascular: See HPI  GI: Negative; No nausea, vomiting, diarrhea, or abdominal pain GU: Negative; No dysuria, hematuria, or difficulty voiding Musculoskeletal: Negative; no myalgias, joint pain, or weakness Hematologic/Oncology: Negative; no easy bruising, bleeding Endocrine: Negative; no heat/cold intolerance; no diabetes Neuro: Negative; no changes in balance, headaches Skin: Negative; No rashes or skin lesions Psychiatric: Positive for depression Sleep: Negative; No snoring, daytime sleepiness, hypersomnolence, bruxism, restless legs, hypnogognic hallucinations, no cataplexy Other comprehensive 14 point system review is negative.   PHYSICAL EXAM:   VS:  BP 100/78 (BP Location: Left Arm, Patient Position: Sitting, Cuff Size: Normal)    Pulse 90    Temp (!) 97.3 F (36.3 C)    Ht '5\' 10"'  (1.778 m)    Wt 235 lb (106.6 kg)    BMI 33.72 kg/m     Repeat blood pressure by me was 100/68 supine and  96/70 standing  Wt Readings from Last 3 Encounters:  11/22/19 235 lb (106.6 kg)  11/14/19 222 lb 0.1 oz (100.7 kg)  10/23/19 230 lb 9.6 oz (104.6 kg)     General: Alert, oriented, no distress.  Skin: normal turgor, no rashes, warm and dry HEENT: Normocephalic, atraumatic. Pupils equal round and reactive to light; sclera anicteric; extraocular muscles intact;  Nose without nasal septal hypertrophy Mouth/Parynx benign; Mallinpatti scale 3 Neck: No JVD, no carotid bruits; normal carotid upstroke Lungs: clear to ausculatation and percussion; no wheezing or rales Chest wall: without tenderness to palpitation Heart: PMI not displaced, RRR, s1 s2 normal, 1/6 systolic murmur, no diastolic murmur; positive for summation gallop; no rubs, , thrills, or heaves Abdomen: soft, nontender; no hepatosplenomehaly, BS+; abdominal aorta nontender and not dilated by palpation. Back: no CVA tenderness Pulses 2+ Musculoskeletal: full range of motion, normal strength, no joint deformities Extremities: no clubbing cyanosis or edema, Homan's sign negative  Neurologic: grossly nonfocal; Cranial nerves grossly wnl Psychologic: Normal mood and affect   Studies/Labs Reviewed:   EKG:  EKG is ordered today. ECG (independently read by me): Normal sinus rhythm at 90 bpm.  QS complex V1 through V6 as well as inferiorly consistent with extensive prior MI.  QTc interval 44 ms.  Left axis deviation  August 29, 2019 ECG (independently read by me):  NSR at 88; IVCD, Inferior Q waves and QS V1-6.  July 08, 2019 ECG (independently read by me): NSR at 82; left axis deviation, QS complex anterolaterally consistent with an anterior MI.  Inferior Q waves.    Recent Labs: BMP Latest Ref Rng & Units 11/14/2019 11/13/2019 11/13/2019  Glucose 70 - 99 mg/dL 93 84 89  BUN 8 - 23 mg/dL 26(H) 19 18  Creatinine 0.61 - 1.24 mg/dL 1.35(H) 1.25(H) 1.19  BUN/Creat Ratio 10 - 24 - - -  Sodium 135 - 145 mmol/L 142 140 141  Potassium 3.5  - 5.1 mmol/L 3.5 3.3(L) 3.3(L)  Chloride 98 - 111 mmol/L 106 104 107  CO2 22 - 32 mmol/L '25 24 24  ' Calcium 8.9 - 10.3 mg/dL 9.7 9.6 9.5     Hepatic Function Latest Ref Rng & Units 11/13/2019 10/22/2019 10/17/2019  Total Protein 6.5 - 8.1 g/dL 6.6 6.6 7.7  Albumin 3.5 - 5.0 g/dL 3.8 3.9 4.4  AST 15 - 41 U/L 14(L) 17 22  ALT 0 - 44 U/L '16 21 19  ' Alk Phosphatase 38 - 126 U/L 69 63 82  Total Bilirubin 0.3 - 1.2 mg/dL 1.8(H) 1.5(H) 1.2  Bilirubin, Direct 0.00 - 0.40 mg/dL - - -    CBC Latest Ref Rng & Units 11/13/2019 11/12/2019 10/22/2019  WBC 4.0 - 10.5 K/uL 7.4 8.1 8.0  Hemoglobin 13.0 - 17.0 g/dL 12.3(L) 13.8 12.4(L)  Hematocrit 39.0 - 52.0 % 38.3(L) 43.3 38.7(L)  Platelets 150 - 400 K/uL 176 201 191   Lab Results  Component Value Date   MCV 96.5 11/13/2019   MCV 98.2 11/12/2019   MCV 96.8 10/22/2019   Lab Results  Component Value Date   TSH 100.957 (H) 09/11/2019   Lab Results  Component Value Date   HGBA1C 6.7 (H) 09/11/2019     BNP    Component Value Date/Time   BNP 462.0 (H) 11/12/2019 1511    ProBNP    Component Value Date/Time   PROBNP 992 (H) 07/26/2019 1102     Lipid Panel     Component Value Date/Time   CHOL 113 03/28/2019 0834   TRIG 136 03/28/2019 0834   HDL 27 (L) 03/28/2019 0834   CHOLHDL 4.2 03/28/2019 0834   CHOLHDL 5.6 01/26/2019 2056   VLDL 38 01/26/2019 2056   LDLCALC 59 03/28/2019 0834     RADIOLOGY: DG Chest 2 View  Result Date: 11/12/2019 CLINICAL DATA:  short of breath and chest tightness EXAM: CHEST - 2 VIEW COMPARISON:  10/21/2019 FINDINGS: Stable cardiac enlargement. Small to moderate bilateral pleural effusions identified. Diffuse increase interstitial markings throughout both lungs identified compatible with pulmonary edema. The chronic interstitial changes of emphysema are also noted. No airspace consolidation. IMPRESSION: 1. Cardiac enlargement and congestive heart failure. Electronically Signed   By: Kerby Moors M.D.   On:  11/12/2019 14:33   DG Chest Port 1 View  Result Date: 11/14/2019 CLINICAL DATA:  Shortness of breath and chest tightness EXAM: PORTABLE CHEST 1 VIEW COMPARISON:  11/12/2019 FINDINGS: Cardiomegaly and interstitial opacity similar to prior. No definite effusion. No air bronchograms or pneumothorax. IMPRESSION: Interstitial opacity from emphysema and possibly mild edema. Cardiomegaly. Electronically Signed   By: Monte Fantasia M.D.   On: 11/14/2019 05:44     Additional studies/ records that were reviewed today include:  Echo 01/27/2019 1. The left ventricle has moderately reduced systolic function, with an ejection fraction of 35-40%. There is mildly increased left ventricular wall thickness. 2. Severe  akinesis of the left ventricular, mid-apical anteroseptal wall, anterior wall, apical segment and inferoapical wall. 3. Small, fixed thrombus on the anteroapical wall of the left ventricle.   Cath 01/26/2019  Prox RCA lesion is 20% stenosed.  Prox Cx lesion is 20% stenosed.  Prox LAD-1 lesion is 100% stenosed.  Post intervention, there is a 0% residual stenosis.  Prox LAD-2 lesion is 20% stenosed.  Mid LAD lesion is 40% stenosed.  LV end diastolic pressure is mildly elevated.  There is moderate left ventricular systolic dysfunction.  A stent was successfully placed.  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  Almost a common ostium left main with immediate trifurcation into the LAD, small ramus intermediate vessel and moderate size circumflex vessel. The ramus vessel is normal. The circumflex vessel has 20% proximal narrowing and gave rise to 2 marginal branches. The RCA is a dominant vessel with moderate luminal irregularity with 20% narrowing proximally 40% mid stenosis and 30% narrowing proximal to the acute margin.  Probable moderate  acute LV dysfunction with hypocontractility involving the anterolateral wall and apex. LVEDP 22 mm.  Successful PCI to the totally occluded LAD with PTCA and ultimate insertion of a 3.0 x 22 mm Resolute Onyx stent postdilated to 3.26 mm with the 100% occlusion being reduced to 0% and TIMI 0 flow improving to TIMI-3 flow. Once reperfusion was established, LAD had 20% mid LAD smooth narrowing and 40% distal smooth stenosis. The LAD wrapped around the apex and supplied the distal third of the inferior wall.  RECOMMENDATION: DAPT for minimum of 1 year. Optimal blood pressure control with ideal blood pressure less than 120/80. High potency statin therapy with target LDL less than 70. Medical therapy for concomitant CAD. A 2D echo Doppler study will be ordered improved LV function assessment.    Intervention       ECHO 08/17/2019 1. Left ventricular ejection fraction, by visual estimation, is 25 to 30%. The left ventricle has severely decreased function. There is no left ventricular hypertrophy. 2. Left ventricular diastolic Doppler parameters are consistent with pseudonormalization pattern of LV diastolic filling. 3. There is akinesis of the mid- distal anterioseptal wall, apex and apical lateral regions. There is spontaneous contrast in apical LV . Definity contrast was used. There is no evidence of apical thrombi. 4. Global right ventricle has normal systolic function.The right ventricular size is normal. No increase in right ventricular wall thickness. 5. Left atrial size was mildly dilated. 6. Right atrial size was normal. 7. The mitral valve is normal in structure. Moderate mitral valve regurgitation. 8. The tricuspid valve is normal in structure. Tricuspid valve regurgitation is trivial. 9. The aortic valve is normal in structure. Aortic valve regurgitation was not visualized by color flow Doppler. 10. The pulmonic valve was grossly normal. Pulmonic valve regurgitation is  not visualized by color flow Doppler. 11. Aortic dilatation noted. 12. There is mild dilatation of the ascending aorta measuring 39 mm. 13. Normal pulmonary artery systolic pressure. 14. The atrial septum is grossly normal.   ASSESSMENT:    1. CAD in native artery   2. ST elevation myocardial infarction involving left anterior descending (LAD) coronary artery [facility]): March 2020   3. Ischemic cardiomyopathy   4. Essential hypertension   5. Medication management   6. Hyperlipidemia LDL goal <70   7. Hypothyroidism, unspecified type     PLAN:  Mr. Sevon  Alvarez is a 64 year old gentleman who developed new onset chest pain and was found to have acute anterior myocardial infarction on January 27, 2019.  He underwent emergent cardiac catheterization and successful intervention to a totally occluded proximal LAD.  He had concomitant CAD and his LAD was successfully stented with ultimate insertion of a 3.0 x 22 mm Resolute Onyx DES stent postdilated 3.26 mm.  There was acute LV dysfunction with EF at 35% and was also found to have a small apical mural thrombus for which he initially was started on triple drug therapy with aspirin/Plavix in addition to Xarelto.  I had not seen him since his initial evaluation until he presented to the office to see me in August 2020.  At that time, I recommended he discontinue aspirin and continue on Plavix and Xarelto to reduce potential bleed risk.  He was on initial dose Entresto and his dose was further titrated to 49/51 mg twice a day.  Due to somewhat low blood pressure I had recommended he decrease his Lasix and only take this on an as-needed basis.  Apparently he never reduce the Lasix dose.  He has had several admissions to the hospital since November 2020 and apparently his medications have been adjusted.  He is no longer on Entresto due to low blood pressure and is only on carvedilol 3.125 mg twice a day in addition to furosemide 40 mg twice a day and  spironolactone 12.5 mg daily.  His heart rate today is in the 90s.  Blood pressure is too low to reinitiate Entresto.  I have recommended initiation of  low-dose Lanoxin and recommended 0.125 mg daily for 4 days and then 0.0625 mg daily.  I will slightly try to titrate carvedilol to 4.375 mg twice a day if blood pressure allows.  I am recommending follow-up laboratory with a comprehensive metabolic panel, BNP, TSH, CBC and lipid studies in addition to Lanoxin level in several weeks.  With his persistent severely reduced LV function I am referring him for electrophysiology evaluation for consideration of prophylactic ICD implantation to reduce potential sudden cardiac arrhythmic death.  Hopefully medications can be further adjusted and ARB or Entresto can be reinstituted in the future.  If hypotension continues to be a problem, he will be referred to advanced heart failure clinic.  He continues to be on levothyroxine for hypothyroidism.  I will see him in 4 weeks for follow-up evaluation/    Medication Adjustments/Labs and Tests Ordered: Current medicines are reviewed at length with the patient today.  Concerns regarding medicines are outlined above.  Medication changes, Labs and Tests ordered today are listed in the Patient Instructions below. Patient Instructions  Medication Instructions:  Start taking 1.50m Digoxin for 4 days then 1/2 the pill starting the 5th day.  Continue to take 3.1243mCarvedilol twice daily for 1 week and then add 1/2 pill to regular dose. 1.5 pills twice daily from then on.  If you need a refill on your cardiac medications before your next appointment, please call your pharmacy.   Lab work: In 3 weeks get labs drawn: CMET, BNP, TSH, Digoxin, CBC and Lipid levels If you have labs (blood work) drawn today and your tests are completely normal, you will receive your results only by: [place]) OR A paper copy in the mail If you have any lab test  that is abnormal or we need to change your treatment, we will call you to review the results.  Testing/Procedures: NONE  Follow-Up: At [facility], you and your health needs are our priority.  As part of our continuing mission to provide you with exceptional heart care, we have created designated Provider Care Teams.  These Care Teams include your primary Cardiologist (physician) and Advanced Practice Providers (APPs -  Physician Assistants and Nurse Practitioners) who all work together to provide you with the care you need, when you need it. You may see Shelva Majestic, MD or one of the following Advanced Practice Providers on your designated Care Team:    Almyra Deforest, PA-C  Fabian Sharp, [place] or   Roby Lofts, [place]  Your physician wants you to follow-up in:1 month with [name]   Any Other Special Instructions Will Be Listed Below (If Applicable). You have been referred to Electrophysiology. They will be in contact.         Signed, Shelva Majestic, MD  11/24/2019 7:38 PM    [place] [place] [address], [place], [place], [place]  [address] Phone: [phone]

## 2019-11-22 NOTE — Patient Instructions (Addendum)
Medication Instructions:  Start taking 1.25mg  Digoxin for 4 days then 1/2 the pill starting the 5th day.  Continue to take 3.125mg  Carvedilol twice daily for 1 week and then add 1/2 pill to regular dose. 1.5 pills twice daily from then on.  If you need a refill on your cardiac medications before your next appointment, please call your pharmacy.   Lab work: In 3 weeks get labs drawn: CMET, BNP, TSH, Digoxin, CBC and Lipid levels If you have labs (blood work) drawn today and your tests are completely normal, you will receive your results only by: [place] (if you have MyChart) OR A paper copy in the mail If you have any lab test that is abnormal or we need to change your treatment, we will call you to review the results.  Testing/Procedures: NONE  Follow-Up: At [facility], you and your health needs are our priority.  As part of our continuing mission to provide you with exceptional heart care, we have created designated Provider Care Teams.  These Care Teams include your primary Cardiologist (physician) and Advanced Practice Providers (APPs -  Physician Assistants and Nurse Practitioners) who all work together to provide you with the care you need, when you need it. You may see Shelva Majestic, MD or one of the following Advanced Practice Providers on your designated Care Team:    Almyra Deforest, PA-C  Fabian Sharp, [place] or   Roby Lofts, [place]  Your physician wants you to follow-up in:1 month with [name]   Any Other Special Instructions Will Be Listed Below (If Applicable). You have been referred to Electrophysiology. They will be in contact.

## 2019-11-24 ENCOUNTER — Encounter: Payer: Self-pay | Admitting: Cardiovascular Disease

## 2019-11-30 NOTE — H&P (View-Only) (Signed)
Electrophysiology TeleHealth Note   Due to national recommendations of social distancing due to COVID 19, an audio/video telehealth visit is felt to be most appropriate for this patient at this time.  See MyChart message from today for the patient's consent to telehealth for [facility].   Date:  12/01/2019   ID:  Tony Alvarez, DOB [birth year], MRN [id number]  Location: patient's home  Provider location: [address] [place]  Evaluation Performed: Follow-up visit  PCP:  [facility]  Cardiologist:   TK Electrophysiologist:  SK   Chief Complaint:  ICM  History of Present Illness:    Tony Alvarez is a 64 y.o. male who presents via audio/video conferencing for a telehealth visit today.  He is referred for consideration of an ICD  Presented 3/20 with CP >>Anterrior MI>> PCI LAD  Has had persistent LV dysfunction, and has had limited drug uptitration 2/2 low BP  The patient denies chest pain,   nocturnal dyspnea, orthopnea or peripheral edema.  There have been no palpitations, lightheadedness or syncope.  Mild SOB with exertion but biggest limitation is knees, had had TKR on the right and awaits it on the L  He tells me his wife, Tony Alvarez, died one year ago next month from perioperative complications related ot brain surgery.  No children, recently to the area.  Has Hx of depression and is being seen and treated for it    DATE TEST EF   3/20 LHV  LAD-T>>0% CXp-20%; RCAp-20%  3/20 Echo   35-40 %   1020 Echo   25-30 %         Date Cr K Hgb  1/21 1.35 3.5 12.3          The patient denies symptoms of fevers, chills, cough, or new SOB worrisome for COVID 19.   Past Medical History:  Diagnosis Date  . Barrett's esophagus 04/21/2018  . CAD (coronary artery disease)    a. s/p STEMI in 01/2019 with DES to proximal LAD  . CHF (congestive heart failure) ([place])    a. EF 35-40% by echo in 01/2019, at 25-30% by repeat echo in 05/2019 and 08/2019    . Depression   . High cholesterol   . Hypertension   . Hypothyroid   . Sleep apnea    wears CPAP at home    Past Surgical History:  Procedure Laterality Date  . BIOPSY  05/24/2018   Procedure: BIOPSY;  Surgeon: Rogene Houston, MD;  [facility];  Service: Endoscopy;;  gastric  . COLONOSCOPY    . COLONOSCOPY N/A 05/24/2018   Procedure: COLONOSCOPY;  Surgeon: Rogene Houston, MD;  [facility];  Service: Endoscopy;  Laterality: N/A;  8:55  . COLONOSCOPY WITH ESOPHAGOGASTRODUODENOSCOPY (EGD)    . CORONARY STENT INTERVENTION N/A 01/26/2019   Procedure: CORONARY STENT INTERVENTION;  Surgeon: Troy Sine, MD;  Location: [place] CV LAB;  Service: Cardiovascular;  Laterality: N/A;  . CORONARY/GRAFT ACUTE MI REVASCULARIZATION N/A 01/26/2019   Procedure: Coronary/Graft Acute MI Revascularization;  Surgeon: Troy Sine, MD;  Location: [facility];  Service: Cardiovascular;  Laterality: N/A;  . ESOPHAGOGASTRODUODENOSCOPY N/A 05/24/2018   Procedure: ESOPHAGOGASTRODUODENOSCOPY (EGD);  Surgeon: Rogene Houston, MD;  [facility];  Service: Endoscopy;  Laterality: N/A;  . LEFT HEART CATH AND CORONARY ANGIOGRAPHY N/A 01/26/2019   Procedure: LEFT HEART CATH AND CORONARY ANGIOGRAPHY;  Surgeon: Troy Sine, MD;  Location:  [place] INVASIVE CV LAB;  Service: Cardiovascular;  Laterality: N/A;  . MEDIAL PARTIAL KNEE REPLACEMENT     2015  . POLYPECTOMY  05/24/2018   Procedure: POLYPECTOMY;  Surgeon: Rogene Houston, MD;  [facility];  Service: Endoscopy;;  colon  . rt hand injury    . shoulder surgery for bursitis    . TONSILLECTOMY      Current Outpatient Medications  Medication Sig Dispense Refill  . acetaminophen (TYLENOL) 325 MG tablet Take 2 tablets (650 mg total) by mouth every 6 (six) hours as needed for mild pain, fever or headache (or Fever >/= 101). 12 tablet 0  . albuterol (VENTOLIN HFA) 108 (90 Base) MCG/ACT inhaler Inhale 2 puffs  into the lungs every 6 (six) hours as needed for wheezing or shortness of breath. 18 g 1  . ARIPiprazole (ABILIFY) 15 MG tablet Take 15 mg by mouth at bedtime.    Marland Kitchen aspirin 81 MG chewable tablet Chew 1 tablet (81 mg total) by mouth daily.    Marland Kitchen atorvastatin (LIPITOR) 80 MG tablet Take 1 tablet (80 mg total) by mouth daily at 6 PM. 90 tablet 3  . buPROPion (WELLBUTRIN SR) 150 MG 12 hr tablet Take 150-300 mg by mouth 2 (two) times daily. Take two tablets (300 mg) by mouth every morning and one tablet (150 mg) with lunch  0  . carvedilol (COREG) 3.125 MG tablet Take 1 whole pill for 1 week and then take 1 pill and 1/2 pill after. 90 tablet 3  . clopidogrel (PLAVIX) 75 MG tablet Take 75 mg by mouth daily.    . digoxin (LANOXIN) 0.125 MG tablet Take a whole pill for 4 days then 1/2 pill  after. 90 tablet 3  . Eszopiclone 3 MG TABS Take 3 mg by mouth at bedtime.    . folic acid (FOLVITE) 1 MG tablet Take 1 tablet (1 mg total) by mouth daily. 30 tablet 2  . furosemide (LASIX) 40 MG tablet Take 1 tablet (40 mg total) by mouth 2 (two) times daily. In early morning and early afternoon. 60 tablet 2  . guaiFENesin (MUCINEX) 600 MG 12 hr tablet Take 2 tablets (1,200 mg total) by mouth 2 (two) times daily as needed. (Patient not taking: Reported on 12/01/2019) 20 tablet 0  . levothyroxine (SYNTHROID) 125 MCG tablet Take 125 mcg by mouth daily as needed (for thyroid levels).     . Multiple Vitamin (MULTIVITAMIN WITH MINERALS) TABS tablet Take 1 tablet by mouth every morning.    . nitroGLYCERIN (NITROSTAT) 0.4 MG SL tablet Place 1 tablet (0.4 mg total) under the tongue every 5 (five) minutes x 3 doses as needed for chest pain. 25 tablet 3  . pantoprazole (PROTONIX) 40 MG tablet Take 1 tablet (40 mg total) by mouth daily. 30 tablet 1  . potassium chloride SA (KLOR-CON) 20 MEQ tablet Take 2 tablets (40 mEq total) by mouth daily. 60 tablet 0  . QUEtiapine (SEROQUEL) 100 MG tablet Take 50-100 mg by mouth at bedtime.       Marland Kitchen spironolactone (ALDACTONE) 25 MG tablet Take 0.5 tablets (12.5 mg total) by mouth daily. 45 tablet 1  . VIIBRYD 20 MG TABS Take 20 mg by mouth daily.      No current facility-administered medications for this visit.    Allergies:   Patient has no known allergies.   Social History:  The patient  reports that he has quit smoking. His smoking use included cigarettes. He has a  10.00 pack-year smoking history. He has never used smokeless tobacco. He reports current alcohol use. He reports that he does not use drugs.   Family History:  The patient's   family history is not on file.   ROS:  Please see the history of present illness.   All other systems are personally reviewed and negative.    Exam:    Vital Signs:  BP (!) 99/59   Pulse 85   Wt 222 lb (100.7 kg)   BMI 31.85 kg/m     Labs/Other Tests and Data Reviewed:    Recent Labs: 07/26/2019: NT-Pro BNP 992 09/11/2019: TSH 100.957 11/12/2019: B Natriuretic Peptide 462.0 11/13/2019: ALT 16; Hemoglobin 12.3; Magnesium 2.0; Platelets 176 11/14/2019: BUN 26; Creatinine, Ser 1.35; Potassium 3.5; Sodium 142   Wt Readings from Last 3 Encounters:  12/01/19 222 lb (100.7 kg)  11/22/19 235 lb (106.6 kg)  11/14/19 222 lb 0.1 oz (100.7 kg)     Other studies personally reviewed: Additional studies/ records that were reviewed today include: *As above   ECG Personally reviewed 10/21/19 11/12/19., 11/22/19, Sinus with wide QRS 171msec or so, with IVCD, q wave v3-V6, qR lead 1 and sometimes monophasic in lead L ASSESSMENT & PLAN:   Ischemic Cardiomyopathy  Low BP precluding drug uptitration  Grief at the loss of his wife  Depression  The pt has persistent LV dysfunction despite maximally tolerated medical therapy-- it is reasonable to proceed with ICD for primary prevention od [place] D  His QRSd, the IVCD and his class 2b symptoms do not justify implant of CRT   He is concerned about cost so will try and help him get an estimate of his out of  pocket  Discussed the sad loss of his wife, one year ago next week  His depression he says is stable  COVID 19 screen The patient denies symptoms of COVID 19 at this time.  The importance of social distancing was discussed today.  Follow-up: 90 day post ICD implant    Current medicines are reviewed at length with the patient today.   The patient does not have concerns regarding his medicines.  The following changes were made today:  none  Labs/ tests ordered today include:   No orders of the defined types were placed in this encounter.   Future tests ( post COVID )     Patient Risk:  after full review of this patients clinical status, I feel that they are at moderate * risk at this time.  Today, I have spent 21  minutes with the patient with telehealth technology discussing the above.  Signed, Virl Axe, MD  12/01/2019 4:18 PM     [place] [address] [phone] (office) [phone] (fax)

## 2019-11-30 NOTE — Progress Notes (Signed)
Electrophysiology TeleHealth Note   Due to national recommendations of social distancing due to COVID 19, an audio/video telehealth visit is felt to be most appropriate for this patient at this time.  See MyChart message from today for the patient's consent to telehealth for [facility].   Date:  12/01/2019   ID:  Tony Alvarez, DOB 07/20/1956, MRN [id number]  Location: patient's home  Provider location: [address] [place]  Evaluation Performed: Follow-up visit  PCP:  [facility]  Cardiologist:   TK Electrophysiologist:  SK   Chief Complaint:  ICM  History of Present Illness:    Tony Alvarez is a 64 y.o. male who presents via audio/video conferencing for a telehealth visit today.  He is referred for consideration of an ICD  Presented 3/20 with CP >>Anterrior MI>> PCI LAD  Has had persistent LV dysfunction, and has had limited drug uptitration 2/2 low BP  The patient denies chest pain,   nocturnal dyspnea, orthopnea or peripheral edema.  There have been no palpitations, lightheadedness or syncope.  Mild SOB with exertion but biggest limitation is knees, had had TKR on the right and awaits it on the L  He tells me his wife, Tony Alvarez, died one year ago next month from perioperative complications related ot brain surgery.  No children, recently to the area.  Has Hx of depression and is being seen and treated for it    DATE TEST EF   3/20 LHV  LAD-T>>0% CXp-20%; RCAp-20%  3/20 Echo   35-40 %   1020 Echo   25-30 %         Date Cr K Hgb  1/21 1.35 3.5 12.3          The patient denies symptoms of fevers, chills, cough, or new SOB worrisome for COVID 19.   Past Medical History:  Diagnosis Date  . Barrett's esophagus 04/21/2018  . CAD (coronary artery disease)    a. s/p STEMI in 01/2019 with DES to proximal LAD  . CHF (congestive heart failure) ([place])    a. EF 35-40% by echo in 01/2019, at 25-30% by repeat echo in 05/2019 and 08/2019    . Depression   . High cholesterol   . Hypertension   . Hypothyroid   . Sleep apnea    wears CPAP at home    Past Surgical History:  Procedure Laterality Date  . BIOPSY  05/24/2018   Procedure: BIOPSY;  Surgeon: Rogene Houston, MD;  [facility];  Service: Endoscopy;;  gastric  . COLONOSCOPY    . COLONOSCOPY N/A 05/24/2018   Procedure: COLONOSCOPY;  Surgeon: Rogene Houston, MD;  [facility];  Service: Endoscopy;  Laterality: N/A;  8:55  . COLONOSCOPY WITH ESOPHAGOGASTRODUODENOSCOPY (EGD)    . CORONARY STENT INTERVENTION N/A 01/26/2019   Procedure: CORONARY STENT INTERVENTION;  Surgeon: Troy Sine, MD;  Location: [place] CV LAB;  Service: Cardiovascular;  Laterality: N/A;  . CORONARY/GRAFT ACUTE MI REVASCULARIZATION N/A 01/26/2019   Procedure: Coronary/Graft Acute MI Revascularization;  Surgeon: Troy Sine, MD;  Location: [place] CV LAB;  Service: Cardiovascular;  Laterality: N/A;  . ESOPHAGOGASTRODUODENOSCOPY N/A 05/24/2018   Procedure: ESOPHAGOGASTRODUODENOSCOPY (EGD);  Surgeon: Rogene Houston, MD;  [facility];  Service: Endoscopy;  Laterality: N/A;  . LEFT HEART CATH AND CORONARY ANGIOGRAPHY N/A 01/26/2019   Procedure: LEFT HEART CATH AND CORONARY ANGIOGRAPHY;  Surgeon: Troy Sine, MD;  Location:  [place] INVASIVE CV LAB;  Service: Cardiovascular;  Laterality: N/A;  . MEDIAL PARTIAL KNEE REPLACEMENT     2015  . POLYPECTOMY  05/24/2018   Procedure: POLYPECTOMY;  Surgeon: Rogene Houston, MD;  [facility];  Service: Endoscopy;;  colon  . rt hand injury    . shoulder surgery for bursitis    . TONSILLECTOMY      Current Outpatient Medications  Medication Sig Dispense Refill  . acetaminophen (TYLENOL) 325 MG tablet Take 2 tablets (650 mg total) by mouth every 6 (six) hours as needed for mild pain, fever or headache (or Fever >/= 101). 12 tablet 0  . albuterol (VENTOLIN HFA) 108 (90 Base) MCG/ACT inhaler Inhale 2 puffs  into the lungs every 6 (six) hours as needed for wheezing or shortness of breath. 18 g 1  . ARIPiprazole (ABILIFY) 15 MG tablet Take 15 mg by mouth at bedtime.    Marland Kitchen aspirin 81 MG chewable tablet Chew 1 tablet (81 mg total) by mouth daily.    Marland Kitchen atorvastatin (LIPITOR) 80 MG tablet Take 1 tablet (80 mg total) by mouth daily at 6 PM. 90 tablet 3  . buPROPion (WELLBUTRIN SR) 150 MG 12 hr tablet Take 150-300 mg by mouth 2 (two) times daily. Take two tablets (300 mg) by mouth every morning and one tablet (150 mg) with lunch  0  . carvedilol (COREG) 3.125 MG tablet Take 1 whole pill for 1 week and then take 1 pill and 1/2 pill after. 90 tablet 3  . clopidogrel (PLAVIX) 75 MG tablet Take 75 mg by mouth daily.    . digoxin (LANOXIN) 0.125 MG tablet Take a whole pill for 4 days then 1/2 pill  after. 90 tablet 3  . Eszopiclone 3 MG TABS Take 3 mg by mouth at bedtime.    . folic acid (FOLVITE) 1 MG tablet Take 1 tablet (1 mg total) by mouth daily. 30 tablet 2  . furosemide (LASIX) 40 MG tablet Take 1 tablet (40 mg total) by mouth 2 (two) times daily. In early morning and early afternoon. 60 tablet 2  . guaiFENesin (MUCINEX) 600 MG 12 hr tablet Take 2 tablets (1,200 mg total) by mouth 2 (two) times daily as needed. (Patient not taking: Reported on 12/01/2019) 20 tablet 0  . levothyroxine (SYNTHROID) 125 MCG tablet Take 125 mcg by mouth daily as needed (for thyroid levels).     . Multiple Vitamin (MULTIVITAMIN WITH MINERALS) TABS tablet Take 1 tablet by mouth every morning.    . nitroGLYCERIN (NITROSTAT) 0.4 MG SL tablet Place 1 tablet (0.4 mg total) under the tongue every 5 (five) minutes x 3 doses as needed for chest pain. 25 tablet 3  . pantoprazole (PROTONIX) 40 MG tablet Take 1 tablet (40 mg total) by mouth daily. 30 tablet 1  . potassium chloride SA (KLOR-CON) 20 MEQ tablet Take 2 tablets (40 mEq total) by mouth daily. 60 tablet 0  . QUEtiapine (SEROQUEL) 100 MG tablet Take 50-100 mg by mouth at bedtime.       Marland Kitchen spironolactone (ALDACTONE) 25 MG tablet Take 0.5 tablets (12.5 mg total) by mouth daily. 45 tablet 1  . VIIBRYD 20 MG TABS Take 20 mg by mouth daily.      No current facility-administered medications for this visit.    Allergies:   Patient has no known allergies.   Social History:  The patient  reports that he has quit smoking. His smoking use included cigarettes. He has a  10.00 pack-year smoking history. He has never used smokeless tobacco. He reports current alcohol use. He reports that he does not use drugs.   Family History:  The patient's   family history is not on file.   ROS:  Please see the history of present illness.   All other systems are personally reviewed and negative.    Exam:    Vital Signs:  BP (!) 99/59   Pulse 85   Wt 222 lb (100.7 kg)   BMI 31.85 kg/m     Labs/Other Tests and Data Reviewed:    Recent Labs: 07/26/2019: NT-Pro BNP 992 09/11/2019: TSH 100.957 11/12/2019: B Natriuretic Peptide 462.0 11/13/2019: ALT 16; Hemoglobin 12.3; Magnesium 2.0; Platelets 176 11/14/2019: BUN 26; Creatinine, Ser 1.35; Potassium 3.5; Sodium 142   Wt Readings from Last 3 Encounters:  12/01/19 222 lb (100.7 kg)  11/22/19 235 lb (106.6 kg)  11/14/19 222 lb 0.1 oz (100.7 kg)     Other studies personally reviewed: Additional studies/ records that were reviewed today include: *As above   ECG Personally reviewed 10/21/19 11/12/19., 11/22/19, Sinus with wide QRS 177msec or so, with IVCD, q wave v3-V6, qR lead 1 and sometimes monophasic in lead L ASSESSMENT & PLAN:   Ischemic Cardiomyopathy  Low BP precluding drug uptitration  Grief at the loss of his wife  Depression  The pt has persistent LV dysfunction despite maximally tolerated medical therapy-- it is reasonable to proceed with ICD for primary prevention od [place] D  His QRSd, the IVCD and his class 2b symptoms do not justify implant of CRT   He is concerned about cost so will try and help him get an estimate of his out of  pocket  Discussed the sad loss of his wife, one year ago next week  His depression he says is stable  COVID 19 screen The patient denies symptoms of COVID 19 at this time.  The importance of social distancing was discussed today.  Follow-up: 90 day post ICD implant    Current medicines are reviewed at length with the patient today.   The patient does not have concerns regarding his medicines.  The following changes were made today:  none  Labs/ tests ordered today include:   No orders of the defined types were placed in this encounter.   Future tests ( post COVID )     Patient Risk:  after full review of this patients clinical status, I feel that they are at moderate * risk at this time.  Today, I have spent 21  minutes with the patient with telehealth technology discussing the above.  Signed, Virl Axe, MD  12/01/2019 4:18 PM     [place]  [address] [phone] (office) [phone] (fax)

## 2019-12-01 ENCOUNTER — Telehealth (INDEPENDENT_AMBULATORY_CARE_PROVIDER_SITE_OTHER): Payer: BC Managed Care – PPO | Admitting: Internal Medicine

## 2019-12-01 ENCOUNTER — Other Ambulatory Visit: Payer: Self-pay

## 2019-12-01 VITALS — BP 99/59 | HR 85 | Wt 222.0 lb

## 2019-12-01 DIAGNOSIS — I255 Ischemic cardiomyopathy: Secondary | ICD-10-CM

## 2019-12-01 DIAGNOSIS — I509 Heart failure, unspecified: Secondary | ICD-10-CM

## 2019-12-02 ENCOUNTER — Encounter (HOSPITAL_COMMUNITY): Payer: Self-pay

## 2019-12-02 ENCOUNTER — Emergency Department (HOSPITAL_COMMUNITY)
Admission: EM | Admit: 2019-12-02 | Discharge: 2019-12-02 | Disposition: A | Discharge status: Home / Self Care | Payer: BC Managed Care – PPO | Attending: Emergency Medicine | Admitting: Emergency Medicine

## 2019-12-02 ENCOUNTER — Other Ambulatory Visit: Payer: Self-pay

## 2019-12-02 ENCOUNTER — Emergency Department (HOSPITAL_COMMUNITY): Payer: BC Managed Care – PPO

## 2019-12-02 DIAGNOSIS — Z79899 Other long term (current) drug therapy: Secondary | ICD-10-CM | POA: Insufficient documentation

## 2019-12-02 DIAGNOSIS — N183 Chronic kidney disease, stage 3 unspecified: Secondary | ICD-10-CM | POA: Insufficient documentation

## 2019-12-02 DIAGNOSIS — I2510 Atherosclerotic heart disease of native coronary artery without angina pectoris: Secondary | ICD-10-CM | POA: Insufficient documentation

## 2019-12-02 DIAGNOSIS — I130 Hypertensive heart and chronic kidney disease with heart failure and stage 1 through stage 4 chronic kidney disease, or unspecified chronic kidney disease: Secondary | ICD-10-CM | POA: Insufficient documentation

## 2019-12-02 DIAGNOSIS — E039 Hypothyroidism, unspecified: Secondary | ICD-10-CM | POA: Diagnosis not present

## 2019-12-02 DIAGNOSIS — I252 Old myocardial infarction: Secondary | ICD-10-CM | POA: Insufficient documentation

## 2019-12-02 DIAGNOSIS — Z20822 Contact with and (suspected) exposure to covid-19: Secondary | ICD-10-CM | POA: Insufficient documentation

## 2019-12-02 DIAGNOSIS — R0602 Shortness of breath: Secondary | ICD-10-CM | POA: Diagnosis present

## 2019-12-02 DIAGNOSIS — Z7982 Long term (current) use of aspirin: Secondary | ICD-10-CM | POA: Insufficient documentation

## 2019-12-02 DIAGNOSIS — Z87891 Personal history of nicotine dependence: Secondary | ICD-10-CM | POA: Insufficient documentation

## 2019-12-02 DIAGNOSIS — I509 Heart failure, unspecified: Secondary | ICD-10-CM | POA: Insufficient documentation

## 2019-12-02 LAB — CBC
HCT: 44.4 % (ref 39.0–52.0)
Hemoglobin: 14 g/dL (ref 13.0–17.0)
MCH: 30.3 pg (ref 26.0–34.0)
MCHC: 31.5 g/dL (ref 30.0–36.0)
MCV: 96.1 fL (ref 80.0–100.0)
Platelets: 230 10*3/uL (ref 150–400)
RBC: 4.62 MIL/uL (ref 4.22–5.81)
RDW: 15.5 % (ref 11.5–15.5)
WBC: 8.3 10*3/uL (ref 4.0–10.5)
nRBC: 0 % (ref 0.0–0.2)

## 2019-12-02 LAB — BASIC METABOLIC PANEL
Anion gap: 11 (ref 5–15)
BUN: 15 mg/dL (ref 8–23)
CO2: 24 mmol/L (ref 22–32)
Calcium: 9.5 mg/dL (ref 8.9–10.3)
Chloride: 106 mmol/L (ref 98–111)
Creatinine, Ser: 1.21 mg/dL (ref 0.61–1.24)
GFR calc Af Amer: >60 mL/min (ref >60)
GFR calc non Af Amer: >60 mL/min (ref >60)
Glucose, Bld: 123 mg/dL — ABNORMAL HIGH (ref 70–99)
Potassium: 3.8 mmol/L (ref 3.5–5.1)
Sodium: 141 mmol/L (ref 135–145)

## 2019-12-02 LAB — MAGNESIUM: Magnesium: 2 mg/dL (ref 1.7–2.4)

## 2019-12-02 LAB — RESPIRATORY PANEL BY RT PCR (FLU A&B, COVID)
Influenza A by PCR: NEGATIVE
Influenza B by PCR: NEGATIVE
SARS Coronavirus 2 by RT PCR: NEGATIVE

## 2019-12-02 LAB — BRAIN NATRIURETIC PEPTIDE: B Natriuretic Peptide: 656 pg/mL — ABNORMAL HIGH (ref 0.0–100.0)

## 2019-12-02 LAB — TROPONIN I (HIGH SENSITIVITY): Troponin I (High Sensitivity): 11 ng/L (ref <18)

## 2019-12-02 MED ORDER — POTASSIUM CHLORIDE CRYS ER 20 MEQ PO TBCR: 20.0000 meq | EXTENDED_RELEASE_TABLET | Freq: Once | ORAL | Status: DC

## 2019-12-02 MED ORDER — FUROSEMIDE 20 MG PO TABS: 20.0000 mg | ORAL_TABLET | ORAL | 0 refills | Status: DC

## 2019-12-02 MED ORDER — FUROSEMIDE 10 MG/ML IJ SOLN
40.0000 mg | Freq: Once | INTRAMUSCULAR | Status: AC
  Administered 2019-12-02: 12:00:00 40 mg via INTRAVENOUS
  Filled 2019-12-02: qty 4

## 2019-12-02 MED ORDER — ALBUTEROL SULFATE HFA 108 (90 BASE) MCG/ACT IN AERS
2.0000 | INHALATION_SPRAY | Freq: Once | RESPIRATORY_TRACT | Status: AC
  Administered 2019-12-02: 12:00:00 2 via RESPIRATORY_TRACT
  Filled 2019-12-02: qty 6.7

## 2019-12-02 NOTE — ED Provider Notes (Signed)
[facility] EMERGENCY DEPARTMENT Provider Note   CSN: [id number] Arrival date & time: 12/02/19  1058     History Chief Complaint  Patient presents with  . Shortness of Breath    Tony Alvarez is a 64 y.o. male with a history of CAD, sp stemi 3/20 with stent placement, CHF and COPD presenting for evaluation of shortness of breath which he woke with this morning.  He denies chest pain, also denies fevers or chills, no increased peripheral edema, but does endorse a 3 pound weight gain from yesterday to today.  He takes Lasix 40 mg twice daily and denies missing any doses and has been adherent to his fluid restriction.  He endorses orthopnea.  He has taken his morning oral dose of Lasix and also uses albuterol inhaler just prior to arrival with no significant improvement in symptoms.  He denies fevers or chills, no significant cough.  He was last admitted for an acute exacerbation of his CHF on 11/12/2019.  His last echo revealed an ejection fraction of 25 to 30%.  The history is provided by the patient.       Past Medical History:  Diagnosis Date  . Barrett's esophagus 04/21/2018  . CAD (coronary artery disease)    a. s/p STEMI in 01/2019 with DES to proximal LAD  . CHF (congestive heart failure) ([place])    a. EF 35-40% by echo in 01/2019, at 25-30% by repeat echo in 05/2019 and 08/2019  . Depression   . High cholesterol   . Hypertension   . Hypothyroid   . Sleep apnea    wears CPAP at home    Patient Active Problem List   Diagnosis Date Noted  . CHF (congestive heart failure) ([place]) 11/13/2019  . Acute respiratory failure with hypoxia ([place]) 11/12/2019  . CKD (chronic kidney disease) stage 3, GFR 30-59 ml/min 11/12/2019  . Acute exacerbation of CHF (congestive heart failure) ([place]) 10/22/2019  . CHF exacerbation ([place]) 10/17/2019  . Acute and chronic respiratory failure (acute-on-chronic) ([place]) 09/11/2019  . Sleep apnea   . Hypertension   . AKI (acute kidney injury) ([place])   . CAD  (coronary artery disease) 06/24/2019  . Hypothyroid   . COPD (chronic obstructive pulmonary disease) ([place])   . Depression   . Hyperlipidemia 01/30/2019  . LV (left ventricular) mural thrombus with acute MI ([place]) 01/30/2019  . Alcohol abuse 01/30/2019  . Alcohol withdrawal delirium ([place]) 01/30/2019  . Elevated liver enzymes 01/30/2019  . Ischemic cardiomyopathy 01/30/2019  . Acute on chronic systolic CHF (congestive heart failure) ([place]) 01/30/2019  . History of colonic polyps 04/21/2018  . Barrett's esophagus 04/21/2018  . Barrett's esophagus without dysplasia 04/21/2018  . Rectal bleeding 04/21/2018    Past Surgical History:  Procedure Laterality Date  . BIOPSY  05/24/2018   Procedure: BIOPSY;  Surgeon: Rogene Houston, MD;  [facility];  Service: Endoscopy;;  gastric  . COLONOSCOPY    . COLONOSCOPY N/A 05/24/2018   Procedure: COLONOSCOPY;  Surgeon: Rogene Houston, MD;  Location: [facility];  Service: Endoscopy;  Laterality: N/A;  8:55  . COLONOSCOPY WITH ESOPHAGOGASTRODUODENOSCOPY (EGD)    . CORONARY STENT INTERVENTION N/A 01/26/2019   Procedure: CORONARY STENT INTERVENTION;  Surgeon: Troy Sine, MD;  Location: [place] CV LAB;  Service: Cardiovascular;  Laterality: N/A;  . CORONARY/GRAFT ACUTE MI REVASCULARIZATION N/A 01/26/2019   Procedure: Coronary/Graft Acute MI Revascularization;  Surgeon: Troy Sine, MD;  Location: [place] CV LAB;  Service:  Cardiovascular;  Laterality: N/A;  . ESOPHAGOGASTRODUODENOSCOPY N/A 05/24/2018   Procedure: ESOPHAGOGASTRODUODENOSCOPY (EGD);  Surgeon: Rogene Houston, MD;  [facility];  Service: Endoscopy;  Laterality: N/A;  . LEFT HEART CATH AND CORONARY ANGIOGRAPHY N/A 01/26/2019   Procedure: LEFT HEART CATH AND CORONARY ANGIOGRAPHY;  Surgeon: Troy Sine, MD;  Location: [place] CV LAB;  Service: Cardiovascular;  Laterality: N/A;  . MEDIAL PARTIAL KNEE REPLACEMENT     2015  . POLYPECTOMY  05/24/2018    Procedure: POLYPECTOMY;  Surgeon: Rogene Houston, MD;  Location[facility];  Service: Endoscopy;;  colon  . rt hand injury    . shoulder surgery for bursitis    . TONSILLECTOMY         Family History  Problem Relation Age of Onset  . Colon cancer Neg Hx     Social History   Tobacco Use  . Smoking status: Former Smoker    Packs/day: 0.25    Years: 40.00    Pack years: 10.00    Types: Cigarettes  . Smokeless tobacco: Never Used  Substance Use Topics  . Alcohol use: Yes    Comment: very rare  . Drug use: Never    Home Medications Prior to Admission medications   Medication Sig Start Date End Date Taking? Authorizing Provider  acetaminophen (TYLENOL) 325 MG tablet Take 2 tablets (650 mg total) by mouth every 6 (six) hours as needed for mild pain, fever or headache (or Fever >/= 101). 09/13/19   Roxan Hockey, MD  albuterol (VENTOLIN HFA) 108 (90 Base) MCG/ACT inhaler Inhale 2 puffs into the lungs every 6 (six) hours as needed for wheezing or shortness of breath. 09/13/19   Roxan Hockey, MD  ARIPiprazole (ABILIFY) 15 MG tablet Take 15 mg by mouth at bedtime. 09/01/19   [provider]  aspirin 81 MG chewable tablet Chew 1 tablet (81 mg total) by mouth daily. 10/22/19   Enzo Bi, MD  atorvastatin (LIPITOR) 80 MG tablet Take 1 tablet (80 mg total) by mouth daily at 6 PM. 01/30/19   Almyra Deforest, PA  buPROPion [facility] SR) 150 MG 12 hr tablet Take 150-300 mg by mouth 2 (two) times daily. Take two tablets (300 mg) by mouth every morning and one tablet (150 mg) with lunch 03/27/18   [provider]  carvedilol (COREG) 3.125 MG tablet Take 1 whole pill for 1 week and then take 1 pill and 1/2 pill after. 11/22/19   Troy Sine, MD  clopidogrel (PLAVIX) 75 MG tablet Take 75 mg by mouth daily.    [provider]  digoxin (LANOXIN) 0.125 MG tablet Take a whole pill for 4 days then 1/2 pill  after. 11/22/19   Troy Sine, MD  Eszopiclone 3 MG TABS  Take 3 mg by mouth at bedtime. 09/01/19   [provider]  folic acid (FOLVITE) 1 MG tablet Take 1 tablet (1 mg total) by mouth daily. 09/14/19   Roxan Hockey, MD  furosemide (LASIX) 40 MG tablet Take 1 tablet (40 mg total) by mouth 2 (two) times daily. In early morning and early afternoon. 10/21/19 01/19/20  Enzo Bi, MD  guaiFENesin (MUCINEX) 600 MG 12 hr tablet Take 2 tablets (1,200 mg total) by mouth 2 (two) times daily as needed. Patient not taking: Reported on 12/01/2019 10/21/19   Enzo Bi, MD  levothyroxine (SYNTHROID) 125 MCG tablet Take 125 mcg by mouth daily as needed (for thyroid levels).     [provider]  Multiple Vitamin (MULTIVITAMIN WITH MINERALS) TABS tablet Take 1 tablet by mouth every morning.    [provider]  nitroGLYCERIN (NITROSTAT) 0.4 MG SL tablet Place 1 tablet (0.4 mg total) under the tongue every 5 (five) minutes x 3 doses as needed for chest pain. 01/30/19   Almyra Deforest, PA  pantoprazole (PROTONIX) 40 MG tablet Take 1 tablet (40 mg total) by mouth daily. 09/13/19 09/12/20  Roxan Hockey, MD  potassium chloride SA (KLOR-CON) 20 MEQ tablet Take 2 tablets (40 mEq total) by mouth daily. 10/21/19 11/20/19  Enzo Bi, MD  QUEtiapine (SEROQUEL) 100 MG tablet Take 50-100 mg by mouth at bedtime.  10/04/19   [provider]  spironolactone (ALDACTONE) 25 MG tablet Take 0.5 tablets (12.5 mg total) by mouth daily. 08/29/19   Troy Sine, MD  VIIBRYD 20 MG TABS Take 20 mg by mouth daily.  09/05/19   [provider]    Allergies    Patient has no known allergies.  Review of Systems   Review of Systems  Constitutional: Negative for chills and fever.  HENT: Negative for congestion and sore throat.   Eyes: Negative.   Respiratory: Positive for shortness of breath and wheezing. Negative for chest tightness.   Cardiovascular: Negative for chest pain, palpitations and leg swelling.  Gastrointestinal: Negative for abdominal pain,  nausea and vomiting.  Genitourinary: Negative.   Musculoskeletal: Negative for arthralgias, joint swelling and neck pain.  Skin: Negative.  Negative for rash and wound.  Neurological: Negative for dizziness, weakness, light-headedness, numbness and headaches.  Psychiatric/Behavioral: Negative.     Physical Exam Updated Vital Signs BP 105/74   Pulse 93   Temp 97.9 F (36.6 C) (Oral)   Resp (!) 24   Ht 5\' 10"  (1.778 m)   Wt 102.1 kg   SpO2 97%   BMI 32.28 kg/m   Physical Exam Vitals and nursing note reviewed.  Constitutional:      Appearance: He is well-developed.  HENT:     Head: Normocephalic and atraumatic.  Eyes:     Conjunctiva/sclera: Conjunctivae normal.  Cardiovascular:     Rate and Rhythm: Normal rate and regular rhythm.     Heart sounds: Normal heart sounds.  Pulmonary:     Effort: Pulmonary effort is normal.     Breath sounds: Examination of the right-middle field reveals rales. Examination of the left-middle field reveals rales. Examination of the right-lower field reveals rales. Examination of the left-lower field reveals rales. Decreased breath sounds, wheezing and rales present.     Comments: Rales appreciated left greater than right.  There is a faint expiratory wheeze at bilateral bases.  Prolonged expirations. Abdominal:     General: Bowel sounds are normal.     Palpations: Abdomen is soft.     Tenderness: There is no abdominal tenderness.  Musculoskeletal:        General: Normal range of motion.     Cervical back: Normal range of motion.     Comments: Bilateral trace ankle edema.  Skin:    General: Skin is warm and dry.  Neurological:     Mental Status: He is alert.     ED Results / Procedures / Treatments   Labs (all labs ordered are listed, but only abnormal results are displayed) Labs Reviewed  BASIC METABOLIC PANEL - Abnormal; Notable for the following components:      Result Value   Glucose, Bld 123 (*)    All other components within  normal limits  BRAIN NATRIURETIC PEPTIDE - Abnormal; Notable for the following components:   B Natriuretic Peptide 656.0 (*)    All other components within normal limits  MAGNESIUM  CBC  TROPONIN I (HIGH SENSITIVITY)    EKG EKG Interpretation  Date/Time:  Friday December 02 2019 11:18:10 EST Ventricular Rate:  111 PR Interval:    QRS Duration: 141 QT Interval:  365 QTC Calculation: 496 R Axis:   -79 Text Interpretation: Sinus tachycardia Probable left atrial enlargement Nonspecific IVCD with LAD Left ventricular hypertrophy Similar to prior. No STEMI Confirmed by Nanda Quinton [id number]) on 12/02/2019 11:55:37 AM   Radiology DG Chest Port 1 View  Result Date: 12/02/2019 CLINICAL DATA:  Shortness of breath. EXAM: PORTABLE CHEST 1 VIEW COMPARISON:  November 14, 2019. FINDINGS: Stable cardiomegaly. Mild central pulmonary vascular congestion is noted. No pneumothorax is noted. Increased bibasilar edema or atelectasis is noted with small bilateral pleural effusions. Bony thorax is unremarkable. IMPRESSION: Stable cardiomegaly with mild central pulmonary vascular congestion. Increased bibasilar edema or atelectasis is noted with small bilateral pleural effusions. Electronically Signed   By: Marijo Conception M.D.   On: 12/02/2019 11:58    Procedures Procedures (including critical care time)  Medications Ordered in ED Medications  furosemide (LASIX) injection 40 mg (40 mg Intravenous Given 12/02/19 1156)  albuterol (VENTOLIN HFA) 108 (90 Base) MCG/ACT inhaler 2 puff (2 puffs Inhalation Given 12/02/19 1154)    ED Course  I have reviewed the triage vital signs and the nursing notes.  Pertinent labs & imaging results that were available during my care of the patient were reviewed by me and considered in my medical decision making (see chart for details).    MDM Rules/Calculators/A&P                      1:10 PM patient was given a dose of IV Lasix 40 mg.  He was also given albuterol MDI 2  puffs with resolution of the wheeze.  Rales persist at this time.  He denies shortness of breath at this time, he is currently on 3 L [place]. Pending diuresis.  3:47 PM Pt feels much improved with his breathing and has been diuresing while here. He was ambulated in the dept, 93-96% without tachypnea or sob.  He felt at his baseline with breathing.  He was advised to increase his am lasix to 60 mg, continue with his evening 40 mg for the next 3 days, then return to his regular dosing. Advised recheck by his cardiologist within the week. Advised recheck here for any return of sx.  Discussed continued close watch on fluid intake.    Labs and imaging reviewed. Pt was felt stable for outpatient f/u.    Final Clinical Impression(s) / ED Diagnoses Final diagnoses:  SOB (shortness of breath)    Rx / DC Orders ED Discharge Orders    None       Landis Martins 12/02/19 1552    Long, Wonda Olds, MD 12/02/19 [phone]

## 2019-12-02 NOTE — Discharge Instructions (Addendum)
We recommend taking an additional dose of lasix 20 mg every morning for the next 3 mornings(with your regular 40 mg tablet). Watch the fluid intake as recommended by your cardiologist closely.  Plan a recheck appointment in one week with your cardiologist.  In the interim, you can return here if you develop any return of your symptoms.

## 2019-12-02 NOTE — ED Triage Notes (Signed)
Pt presents to ED with complaints of SOB since this am, pt denies fever or cough.

## 2019-12-02 NOTE — ED Notes (Signed)
Pt ambulated in the hallway well. O2 sats stayed around 93 and 96% while ambulating.

## 2019-12-06 ENCOUNTER — Telehealth: Payer: Self-pay | Admitting: Cardiovascular Disease

## 2019-12-06 MED ORDER — CARVEDILOL 3.125 MG PO TABS: ORAL_TABLET | ORAL | 3 refills | Status: DC

## 2019-12-06 NOTE — Telephone Encounter (Signed)
Discussed Carvedilol with patient After reviewing note Dr Claiborne Billings wanted to increase his Carvedilol 3.125 mg to 1 and 1/2 tablets twice a day. Per patient he has seen no change in his blood pressure and has increased his Carvedilol to 1 and 1/2 in am and 1 in pm. Patient will increase to the 1 and 1/2 twice a day, continue to monitor blood pressure, and call if any changes.

## 2019-12-06 NOTE — Progress Notes (Signed)
Spoke with pt and reviewed instruction letter for ICD procedure scheduled for Monday 12/12/2019.( See instruction letter for complete details)  Instruction letter placed in lobby folder for pt pick up along with surgical scrub and instructions.  Pt verbalizes understanding and agrees with plan.

## 2019-12-06 NOTE — Telephone Encounter (Signed)
New Message:     Please call, question about his Carvedilol.

## 2019-12-08 ENCOUNTER — Other Ambulatory Visit (HOSPITAL_COMMUNITY)
Admission: RE | Admit: 2019-12-08 | Discharge: 2019-12-08 | Disposition: A | Discharge status: Home / Self Care | Payer: BC Managed Care – PPO | Source: Ambulatory Visit | Attending: Internal Medicine | Admitting: Internal Medicine

## 2019-12-08 DIAGNOSIS — Z20822 Contact with and (suspected) exposure to covid-19: Secondary | ICD-10-CM | POA: Insufficient documentation

## 2019-12-08 DIAGNOSIS — Z01812 Encounter for preprocedural laboratory examination: Secondary | ICD-10-CM | POA: Diagnosis present

## 2019-12-08 LAB — SARS CORONAVIRUS 2 (TAT 6-24 HRS): SARS Coronavirus 2: NEGATIVE

## 2019-12-12 ENCOUNTER — Ambulatory Visit (HOSPITAL_COMMUNITY)
Admission: RE | Disposition: A | Discharge status: Home / Self Care | Payer: Self-pay | Source: Home / Self Care | Attending: Internal Medicine

## 2019-12-12 ENCOUNTER — Ambulatory Visit (HOSPITAL_COMMUNITY)
Admission: RE | Admit: 2019-12-12 | Discharge: 2019-12-12 | Disposition: A | Discharge status: Home / Self Care | Payer: BC Managed Care – PPO | Attending: Internal Medicine | Admitting: Internal Medicine

## 2019-12-12 ENCOUNTER — Other Ambulatory Visit: Payer: Self-pay

## 2019-12-12 ENCOUNTER — Ambulatory Visit (HOSPITAL_COMMUNITY): Payer: BC Managed Care – PPO

## 2019-12-12 DIAGNOSIS — Z95810 Presence of automatic (implantable) cardiac defibrillator: Secondary | ICD-10-CM

## 2019-12-12 DIAGNOSIS — I252 Old myocardial infarction: Secondary | ICD-10-CM | POA: Insufficient documentation

## 2019-12-12 DIAGNOSIS — Z006 Encounter for examination for normal comparison and control in clinical research program: Secondary | ICD-10-CM | POA: Insufficient documentation

## 2019-12-12 DIAGNOSIS — I2510 Atherosclerotic heart disease of native coronary artery without angina pectoris: Secondary | ICD-10-CM | POA: Diagnosis not present

## 2019-12-12 DIAGNOSIS — Z79899 Other long term (current) drug therapy: Secondary | ICD-10-CM | POA: Diagnosis not present

## 2019-12-12 DIAGNOSIS — I959 Hypotension, unspecified: Secondary | ICD-10-CM | POA: Diagnosis not present

## 2019-12-12 DIAGNOSIS — I255 Ischemic cardiomyopathy: Secondary | ICD-10-CM | POA: Diagnosis present

## 2019-12-12 DIAGNOSIS — Z87891 Personal history of nicotine dependence: Secondary | ICD-10-CM | POA: Diagnosis not present

## 2019-12-12 DIAGNOSIS — I110 Hypertensive heart disease with heart failure: Secondary | ICD-10-CM | POA: Insufficient documentation

## 2019-12-12 DIAGNOSIS — Z7982 Long term (current) use of aspirin: Secondary | ICD-10-CM | POA: Insufficient documentation

## 2019-12-12 DIAGNOSIS — I509 Heart failure, unspecified: Secondary | ICD-10-CM | POA: Insufficient documentation

## 2019-12-12 DIAGNOSIS — Z7902 Long term (current) use of antithrombotics/antiplatelets: Secondary | ICD-10-CM | POA: Diagnosis not present

## 2019-12-12 DIAGNOSIS — I429 Cardiomyopathy, unspecified: Secondary | ICD-10-CM

## 2019-12-12 DIAGNOSIS — G4730 Sleep apnea, unspecified: Secondary | ICD-10-CM | POA: Diagnosis not present

## 2019-12-12 DIAGNOSIS — E039 Hypothyroidism, unspecified: Secondary | ICD-10-CM | POA: Insufficient documentation

## 2019-12-12 DIAGNOSIS — E7800 Pure hypercholesterolemia, unspecified: Secondary | ICD-10-CM | POA: Diagnosis not present

## 2019-12-12 DIAGNOSIS — Z96651 Presence of right artificial knee joint: Secondary | ICD-10-CM | POA: Insufficient documentation

## 2019-12-12 DIAGNOSIS — J90 Pleural effusion, not elsewhere classified: Secondary | ICD-10-CM | POA: Insufficient documentation

## 2019-12-12 DIAGNOSIS — F4321 Adjustment disorder with depressed mood: Secondary | ICD-10-CM | POA: Diagnosis not present

## 2019-12-12 DIAGNOSIS — Z955 Presence of coronary angioplasty implant and graft: Secondary | ICD-10-CM | POA: Insufficient documentation

## 2019-12-12 HISTORY — PX: ICD IMPLANT: EP1208

## 2019-12-12 SURGERY — ICD IMPLANT
Anesthesia: LOCAL

## 2019-12-12 MED ORDER — MIDAZOLAM HCL 5 MG/5ML IJ SOLN
INTRAMUSCULAR | Status: AC
  Filled 2019-12-12: qty 5

## 2019-12-12 MED ORDER — FENTANYL CITRATE (PF) 100 MCG/2ML IJ SOLN
INTRAMUSCULAR | Status: DC | PRN
  Administered 2019-12-12 (×2): 25 ug via INTRAVENOUS

## 2019-12-12 MED ORDER — HEPARIN (PORCINE) IN NACL 1000-0.9 UT/500ML-% IV SOLN
INTRAVENOUS | Status: AC
  Filled 2019-12-12: qty 500

## 2019-12-12 MED ORDER — CEFAZOLIN SODIUM-DEXTROSE 2-4 GM/100ML-% IV SOLN
INTRAVENOUS | Status: AC
  Filled 2019-12-12: qty 100

## 2019-12-12 MED ORDER — FENTANYL CITRATE (PF) 100 MCG/2ML IJ SOLN
INTRAMUSCULAR | Status: AC
  Filled 2019-12-12: qty 2

## 2019-12-12 MED ORDER — LIDOCAINE HCL 1 % IJ SOLN
INTRAMUSCULAR | Status: AC
  Filled 2019-12-12: qty 60

## 2019-12-12 MED ORDER — SODIUM CHLORIDE 0.9 % IV SOLN: INTRAVENOUS | Status: DC

## 2019-12-12 MED ORDER — ONDANSETRON HCL 4 MG/2ML IJ SOLN: 4.0000 mg | Freq: Four times a day (QID) | INTRAMUSCULAR | Status: DC | PRN

## 2019-12-12 MED ORDER — ACETAMINOPHEN 325 MG PO TABS
325.0000 mg | ORAL_TABLET | ORAL | Status: DC | PRN
  Filled 2019-12-12: qty 2

## 2019-12-12 MED ORDER — MIDAZOLAM HCL 5 MG/5ML IJ SOLN
INTRAMUSCULAR | Status: DC | PRN
  Administered 2019-12-12 (×2): 1 mg via INTRAVENOUS

## 2019-12-12 MED ORDER — SODIUM CHLORIDE 0.9 % IV SOLN
80.0000 mg | INTRAVENOUS | Status: AC
  Administered 2019-12-12: 80 mg
  Filled 2019-12-12: qty 2

## 2019-12-12 MED ORDER — CEFAZOLIN SODIUM-DEXTROSE 2-4 GM/100ML-% IV SOLN
2.0000 g | INTRAVENOUS | Status: AC
  Administered 2019-12-12: 2 g via INTRAVENOUS
  Filled 2019-12-12: qty 100

## 2019-12-12 MED ORDER — LIDOCAINE HCL (PF) 1 % IJ SOLN
INTRAMUSCULAR | Status: DC | PRN
  Administered 2019-12-12: 60 mL

## 2019-12-12 MED ORDER — SODIUM CHLORIDE 0.9 % IV SOLN
INTRAVENOUS | Status: AC
  Filled 2019-12-12: qty 2

## 2019-12-12 SURGICAL SUPPLY — 7 items
CABLE SURGICAL S-101-97-12 (CABLE) ×2 IMPLANT
HEMOSTAT SURGICEL 2X4 FIBR (HEMOSTASIS) ×2 IMPLANT
ICD GALLANT VR CDVRA500Q (ICD Generator) ×2 IMPLANT
LEAD DURATA 7122Q-65CM (Lead) ×2 IMPLANT
PAD PRO RADIOLUCENT 2001M-C (PAD) ×2 IMPLANT
SHEATH 9FR PRELUDE SNAP 13 (SHEATH) ×2 IMPLANT
TRAY PACEMAKER INSERTION (PACKS) ×2 IMPLANT

## 2019-12-12 NOTE — Progress Notes (Signed)
wound care and activity instructions were reviewed with the patient Routine post implant follow up is in place  Pending post implant device check and CXR  Anticipate discharge after these are completed and cleared by Dr. Pat Kocher, PA-C

## 2019-12-12 NOTE — Progress Notes (Signed)
ICD Criteria  Current LVEF25%. Within 12 months prior to implant: Yes   Heart failure history: Yes, Class II  Cardiomyopathy history: Yes, Ischemic Cardiomyopathy - Prior MI.  Atrial Fibrillation/Atrial Flutter: No.  Ventricular tachycardia history: No.  Cardiac arrest history: No.  History of syndromes with risk of sudden death: No.  Previous ICD: No.  Current ICD indication: Primary  PPM indication: No.  Class I or II Bradycardia indication present: No  Beta Blocker therapy for 3 or more months: Yes, prescribed.   Ace Inhibitor/ARB therapy for 3 or more months: No, medical reason.   I have seen Tony Alvarez is a 64 y.o. malepre-procedural and has been referred by TK for consideration of ICD implant for primary prevention of sudden death.  The patient's chart has been reviewed and they meet criteria for ICD implant.  I have had a thorough discussion with the patient reviewing options.  The patient and their family (if available) have had opportunities to ask questions and have them answered. The patient and I have decided together through the [place] [employer] to implant ICD at this time.  Risks, benefits, alternatives to ICD implantation were discussed in detail with the patient today. The patient  understands that the risks include but are not limited to bleeding, infection, pneumothorax, perforation, tamponade, vascular damage, renal failure, MI, stroke, death, inappropriate shocks, and lead dislodgement and   wishes to proceed.

## 2019-12-12 NOTE — Progress Notes (Addendum)
Called Lisa,RN in cath lab and per Lisa,RN Dr Caryl Comes said client may be discharged; Dr Caryl Comes aware of CXR results and no new orders

## 2019-12-12 NOTE — Interval H&P Note (Signed)
History and Physical Interval Note:  12/12/2019 9:41 AM  Tony Alvarez  has presented today for surgery, with the diagnosis of Cardiomyopathy.  The various methods of treatment have been discussed with the patient and family. After consideration of risks, benefits and other options for treatment, the patient has consented to  Procedure(s): ICD IMPLANT (N/A) as a surgical intervention.  The patient's history has been reviewed, patient examined, no change in status, stable for surgery.  I have reviewed the patient's chart and labs.  Questions were answered to the patient's satisfaction.     Virl Axe  BP 118/78   Pulse 87   Temp 97.8 F (36.6 C) (Oral)   Resp 18   Ht 5\' 10"  (1.778 m)   Wt 98.9 kg   SpO2 99%   BMI 31.28 kg/m  Well developed and nourished in no acute distress HENT normal Neck supple with JVP-  Clear Regular rate and rhythm, no murmurs or gallops Abd-soft with active BS No Clubbing cyanosis edema Skin-warm and dry A & Oriented  Grossly normal sensory and motor function

## 2019-12-12 NOTE — Discharge Instructions (Signed)
Tomorrow, 12/13/19, PLEASE SEND A REMOTE DEVICE TRANSMISSION    Supplemental Discharge Instructions for  Pacemaker/Defibrillator Patients  Activity No heavy lifting or vigorous activity with your left/right arm for 6 to 8 weeks.  Do not raise your left/right arm above your head for one week.  Gradually raise your affected arm as drawn below.              12/16/2019                  12/17/2019                  12/18/2019                12/19/2019 __  NO DRIVING for 7 days.  WOUND CARE - Remove the arm sling tomorrow morning, 12/13/19 - Remove the outer plastic dressing tomorrow 12/13/19, there are steri strips (small paper tapes) underneath, DO NOT remove these - Keep the wound area clean and dry.  Do not get this area wet for one week. No showers for  you may shower on  12/19/2019 evening .  The tape/steri-strips on your wound will fall off; do not pull them off.  No bandage is needed on the site.  DO  NOT apply any creams, oils, or ointments to the wound area  - DO  NOT apply any creams, oils, or ointments to the wound area. - If you notice any drainage or discharge from the wound, any swelling or bruising at the site, or you develop a fever > 101? F after you are discharged home, call the office at once.  Special Instructions - You are still able to use cellular telephones; use the ear opposite the side where you have your pacemaker/defibrillator.  Avoid carrying your cellular phone near your device. - When traveling through airports, show security personnel your identification card to avoid being screened in the metal detectors.  Ask the security personnel to use the hand wand. - Avoid arc welding equipment, MRI testing (magnetic resonance imaging), TENS units (transcutaneous nerve stimulators).  Call the office for questions about other devices. - Avoid electrical appliances that are in poor condition or are not properly grounded. - Microwave ovens are safe to be near or to  operate.  Additional information for defibrillator patients should your device go off: - If your device goes off ONCE and you feel fine afterward, notify the device clinic nurses. - If your device goes off ONCE and you do not feel well afterward, call 911. - If your device goes off TWICE, call 911. - If your device goes off THREE times in one day, call 911.  DO NOT DRIVE YOURSELF OR A FAMILY MEMBER WITH A DEFIBRILLATOR TO THE HOSPITAL--CALL 911.            Cardioverter Defibrillator Implantation  An implantable cardioverter defibrillator (ICD) is a small device that is placed under the skin in the chest or abdomen. An ICD consists of a battery, a small computer (pulse generator), and wires (leads) that go into the heart. An ICD is used to detect and correct two types of dangerous irregular heartbeats (arrhythmias):  A rapid heart rhythm (tachycardia).  An arrhythmia in which the lower chambers of the heart (ventricles) contract in an uncoordinated way (fibrillation). When an ICD detects tachycardia, it sends a low-energy shock to the heart to restore the heartbeat to normal (cardioversion). This signal is usually painless. If cardioversion does not work or if the ICD  detects fibrillation, it delivers a high-energy shock to the heart (defibrillation) to restart the heart. This shock may feel like a strong jolt in the chest. Your health care provider may prescribe an ICD if:  You have had an arrhythmia that originated in the ventricles.  Your heart has been damaged by a disease or heart condition. Sometimes, ICDs are programmed to act as a device called a pacemaker. Pacemakers can be used to treat a slow heartbeat (bradycardia) or tachycardia by taking over the heart rate with electrical impulses. Tell a health care provider about:  Any allergies you have.  All medicines you are taking, including vitamins, herbs, eye drops, creams, and over-the-counter medicines.  Any problems you  or family members have had with anesthetic medicines.  Any blood disorders you have.  Any surgeries you have had.  Any medical conditions you have.  Whether you are pregnant or may be pregnant. What are the risks? Generally, this is a safe procedure. However, problems may occur, including:  Swelling, bleeding, or bruising.  Infection.  Blood clots.  Damage to other structures or organs, such as nerves, blood vessels, or the heart.  Allergic reactions to medicines used during the procedure. What happens before the procedure? Staying hydrated Follow instructions from your health care provider about hydration, which may include:  Up to 2 hours before the procedure - you may continue to drink clear liquids, such as water, clear fruit juice, black coffee, and plain tea. Eating and drinking restrictions Follow instructions from your health care provider about eating and drinking, which may include:  8 hours before the procedure - stop eating heavy meals or foods such as meat, fried foods, or fatty foods.  6 hours before the procedure - stop eating light meals or foods, such as toast or cereal.  6 hours before the procedure - stop drinking milk or drinks that contain milk.  2 hours before the procedure - stop drinking clear liquids. Medicine Ask your health care provider about:  Changing or stopping your normal medicines. This is important if you take diabetes medicines or blood thinners.  Taking medicines such as aspirin and ibuprofen. These medicines can thin your blood. Do not take these medicines before your procedure if your doctor tells you not to. Tests  You may have blood tests.  You may have a test to check the electrical signals in your heart (electrocardiogram, ECG).  You may have imaging tests, such as a chest X-ray. General instructions  For 24 hours before the procedure, stop using products that contain nicotine or tobacco, such as cigarettes and  e-cigarettes. If you need help quitting, ask your health care provider.  Plan to have someone take you home from the hospital or clinic.  You may be asked to shower with a germ-killing soap. What happens during the procedure?  To reduce your risk of infection: ? Your health care team will wash or sanitize their hands. ? Your skin will be washed with soap. ? Hair may be removed from the surgical area.  Small monitors will be put on your body. They will be used to check your heart, blood pressure, and oxygen level.  An IV tube will be inserted into one of your veins.  You will be given one or more of the following: ? A medicine to help you relax (sedative). ? A medicine to numb the area (local anesthetic). ? A medicine to make you fall asleep (general anesthetic).  Leads will be guided through a blood  vessel into your heart and attached to your heart muscles. Depending on the ICD, the leads may go into one ventricle or they may go into both ventricles and into an upper chamber of the heart. An X-ray machine (fluoroscope) will be usedto help guide the leads.  A small incision will be made to create a deep pocket under your skin.  The pulse generator will be placed into the pocket.  The ICD will be tested.  The incision will be closed with stitches (sutures), skin glue, or staples.  A bandage (dressing) will be placed over the incision. This procedure may vary among health care providers and hospitals. What happens after the procedure?  Your blood pressure, heart rate, breathing rate, and blood oxygen level will be monitored often until the medicines you were given have worn off.  A chest X-ray will be taken to check that the ICD is in the right place.  You will need to stay in the hospital for 1-2 days so your health care provider can make sure your ICD is working.  Do not drive for 24 hours if you received a sedative. Ask your health care provider when it is safe for you to  drive.  You may be given an identification card explaining that you have an ICD. Summary  An implantable cardioverter defibrillator (ICD) is a small device that is placed under the skin in the chest or abdomen. It is used to detect and correct dangerous irregular heartbeats (arrhythmias).  An ICD consists of a battery, a small computer (pulse generator), and wires (leads) that go into the heart.  When an ICD detects rapid heart rhythm (tachycardia), it sends a low-energy shock to the heart to restore the heartbeat to normal (cardioversion). If cardioversion does not work or if the ICD detects uncoordinated heart contractions (fibrillation), it delivers a high-energy shock to the heart (defibrillation) to restart the heart.  You will need to stay in the hospital for 1-2 days to make sure your ICD is working. This information is not intended to replace advice given to you by your health care provider. Make sure you discuss any questions you have with your health care provider. Document Revised: 10/09/2017 Document Reviewed: 11/05/2016 Elsevier Patient Education  2020 [employer].

## 2019-12-13 ENCOUNTER — Telehealth: Payer: Self-pay | Admitting: *Deleted

## 2019-12-13 MED FILL — Lidocaine HCl Local Inj 1%: INTRAMUSCULAR | Qty: 60 | Status: AC

## 2019-12-13 MED FILL — Heparin Sod (Porcine)-NaCl IV Soln 1000 Unit/500ML-0.9%: INTRAVENOUS | Qty: 1000 | Status: AC

## 2019-12-13 NOTE — Telephone Encounter (Signed)
-----   Message from [place], [place] sent at 12/12/2019  5:01 PM EST ----- Same day discharge, ICD, SK  ([employer])  Thanks renee

## 2019-12-13 NOTE — Telephone Encounter (Signed)
Spoke with patient to assist with next day transmission. Transmission received and reviewed. Normal ICD function. No alerts. Sensing and impedance results stable. Pt aware. Denies any concerns about ICD site or discharge instructions. Aware of wound check appointment on 12/22/19. Aware to call back with any new concerns. No further questions at this time.

## 2019-12-15 LAB — LIPID PANEL
Chol/HDL Ratio: 3.6 ratio (ref 0.0–5.0)
Cholesterol, Total: 120 mg/dL (ref 100–199)
HDL: 33 mg/dL — ABNORMAL LOW (ref >39)
LDL Chol Calc (NIH): 63 mg/dL (ref 0–99)
Triglycerides: 133 mg/dL (ref 0–149)
VLDL Cholesterol Cal: 24 mg/dL (ref 5–40)

## 2019-12-15 LAB — CBC
Hematocrit: 39 % (ref 37.5–51.0)
Hemoglobin: 12.8 g/dL — ABNORMAL LOW (ref 13.0–17.7)
MCH: 29.8 pg (ref 26.6–33.0)
MCHC: 32.8 g/dL (ref 31.5–35.7)
MCV: 91 fL (ref 79–97)
Platelets: 183 10*3/uL (ref 150–450)
RBC: 4.3 x10E6/uL (ref 4.14–5.80)
RDW: 14.8 % (ref 11.6–15.4)
WBC: 8.1 10*3/uL (ref 3.4–10.8)

## 2019-12-15 LAB — COMPREHENSIVE METABOLIC PANEL
ALT: 8 IU/L (ref 0–44)
AST: 12 IU/L (ref 0–40)
Albumin/Globulin Ratio: 2.2 (ref 1.2–2.2)
Albumin: 4.2 g/dL (ref 3.8–4.8)
Alkaline Phosphatase: 89 IU/L (ref 39–117)
BUN/Creatinine Ratio: 12 (ref 10–24)
BUN: 13 mg/dL (ref 8–27)
Bilirubin Total: 0.9 mg/dL (ref 0.0–1.2)
CO2: 20 mmol/L (ref 20–29)
Calcium: 9.9 mg/dL (ref 8.6–10.2)
Chloride: 106 mmol/L (ref 96–106)
Creatinine, Ser: 1.07 mg/dL (ref 0.76–1.27)
GFR calc Af Amer: 86 mL/min/{1.73_m2} (ref >59)
GFR calc non Af Amer: 75 mL/min/{1.73_m2} (ref >59)
Globulin, Total: 1.9 g/dL (ref 1.5–4.5)
Glucose: 101 mg/dL — ABNORMAL HIGH (ref 65–99)
Potassium: 4.2 mmol/L (ref 3.5–5.2)
Sodium: 143 mmol/L (ref 134–144)
Total Protein: 6.1 g/dL (ref 6.0–8.5)

## 2019-12-15 LAB — DIGOXIN LEVEL: Digoxin, Serum: <0.4 ng/mL — ABNORMAL LOW (ref 0.5–0.9)

## 2019-12-15 LAB — TSH: TSH: 4.1 u[IU]/mL (ref 0.450–4.500)

## 2019-12-15 LAB — BRAIN NATRIURETIC PEPTIDE: BNP: 438.6 pg/mL — ABNORMAL HIGH (ref 0.0–100.0)

## 2019-12-21 ENCOUNTER — Other Ambulatory Visit: Payer: Self-pay

## 2019-12-21 ENCOUNTER — Ambulatory Visit: Payer: BC Managed Care – PPO | Admitting: Cardiovascular Disease

## 2019-12-21 ENCOUNTER — Encounter: Payer: Self-pay | Admitting: Cardiovascular Disease

## 2019-12-21 VITALS — BP 98/72 | HR 91 | Temp 97.9°F | Ht 70.0 in | Wt 215.8 lb

## 2019-12-21 DIAGNOSIS — Z79899 Other long term (current) drug therapy: Secondary | ICD-10-CM | POA: Diagnosis not present

## 2019-12-21 DIAGNOSIS — I5042 Chronic combined systolic (congestive) and diastolic (congestive) heart failure: Secondary | ICD-10-CM | POA: Diagnosis not present

## 2019-12-21 DIAGNOSIS — I2102 ST elevation (STEMI) myocardial infarction involving left anterior descending coronary artery: Secondary | ICD-10-CM | POA: Diagnosis not present

## 2019-12-21 DIAGNOSIS — E039 Hypothyroidism, unspecified: Secondary | ICD-10-CM

## 2019-12-21 DIAGNOSIS — I2510 Atherosclerotic heart disease of native coronary artery without angina pectoris: Secondary | ICD-10-CM

## 2019-12-21 DIAGNOSIS — I255 Ischemic cardiomyopathy: Secondary | ICD-10-CM | POA: Diagnosis not present

## 2019-12-21 MED ORDER — SPIRONOLACTONE 25 MG PO TABS: 12.5000 mg | ORAL_TABLET | Freq: Two times a day (BID) | ORAL | 1 refills | Status: DC

## 2019-12-21 MED ORDER — FUROSEMIDE 40 MG PO TABS: 40.0000 mg | ORAL_TABLET | Freq: Every day | ORAL | 2 refills | Status: DC

## 2019-12-21 MED ORDER — CARVEDILOL 6.25 MG PO TABS: 6.2500 mg | ORAL_TABLET | Freq: Two times a day (BID) | ORAL | 3 refills | Status: DC

## 2019-12-21 NOTE — Progress Notes (Signed)
Cardiology Office Note    Date:  12/27/2019   ID:  Waino Mounsey, DOB Mar 26, 1956, MRN [id number]  PCP:  [facility]  Cardiologist:  Shelva Majestic, MD   F/U evaluation  History of Present Illness:  Tony Alvarez is a 64 y.o. male who presents for a one month F/U cardiology evaluation.  Tony Alvarez has a history of hypertension and hyperlipidemia who developed new onset acute chest pain on January 26, 2019 which radiated to his back while raking leaves.  His chest pain persisted and he ultimately presented to any [facility] emergency room for evaluation.  His ECG showed anterior/anterolateral Q waves with ST elevation in inferior Q waves.  He was transported to [facility] in the setting of an acute anterior ST segment elevation myocardial infarction and underwent emergent catheterization by me which demonstrated total occlusion of his proximal LAD immediately after a large dilated aneurysmal segment in the proximal LAD.  He had almost a common ostium left main with immediate trifurcation into the LAD, ramus and moderate-sized circumflex vessel.  The circumflex vessel had 20% proximal narrowing.  The RCA had nonobstructive stenoses of 20% proximally 40% mid and 30% at the acute margin.  LVEDP was 22 mm.  He underwent successful acute intervention with PTCA and ultimate insertion of a 3.0 x 22 mm Resolute Onyx stent resulting in TIMI-3 flow and residual narrowing of 0%.  Once opened, the LAD also had 20% mid stenosis and 40% distal smooth stenoses and wrapped around the apex.  I had not seen the patient since doing his intervention.  A subsequent echo Doppler study showed an EF of 35 to 40% with severe akinesis of the left ventricular mid anterior anteroseptal wall, anterior wall and apical segment as well as inferoapical wall and there was evidence for small fixed thrombus in the anterior apical wall of the left ventricle.  Apparently he was placed on Xarelto for his LV thrombus.  He also had  delirium concerning for alcohol withdrawal during his hospitalization and received benzodiazepine.    Subsequently, he has had issues with shortness breath and was evaluated in June 2020 with dyspnea.  COVID 19 was negative.  He had had a cough prior to presentation.  CT scan was negative for PE.  He was noted to have LVH as well as emphysema and a left lower lobe opacity suggestive of pneumonia versus atelectasis or scarring.  He had increased interstitial markings.  He was treated with doxycycline.  He also was treated with IV Lasix.    He was evaluated on May 17, 2019 by Wynona Luna and at that time continued to be on triple drug therapy with aspirin, Plavix, and Brilinta.  He has not yet had a follow-up echo Doppler study.  He was currently on carvedilol 3.125 mg twice a day, Entresto 24/26 mg twice a day, furosemide 20 mg daily, and spironolactone 25 mg daily.  He also is on levothyroxine for hypothyroidism.  He admits to occasional shortness of breath with activity.  There also is some residual right ankle edema.    I  saw him in August 2020.  To reduce bleed risk I recommended he discontinue aspirin. I recommended he reduce his Lasix and only take as needed for swelling to allow for further titration of Entresto to 49/51 mg twice a day.  He underwent an echo Doppler study in August 17, 2019 which continued to show an EF at 25 to 30% with grade 2 diastolic dysfunction.  There was akinesis of the mid distal anteroseptal wall, apex and apical lateral regions.  Definity contrast did not reveal any evidence for apical thrombi.  I  saw him on August 29, 2019 at which time he denied  any recurrent episodes of chest pain.  Apparently he never stopped his furosemide and has continued to take this 20 mg every morning.  He continues to be on Xarelto and Plavix.  He is on Entresto 49/51 mg twice a day, spironolactone 25 mg in the morning, and carvedilol 3.125 mg twice a day.  He is on atorvastatin 80  mg for hyperlipidemia and levothyroxine 125 mcg for hypothyroidism.   Since his October 19 evaluation he had several hospitalizations in November, December 2020 with his last on January 2 through November 14, 2019.  He has had issues with low blood pressure and apparently is no longer on Entresto.  Current medications now include carvedilol 3.125 mg twice a day, furosemide 40 mg twice a day.  He has had issues with leg edema.  He also is on spironolactone 12.5 mg daily.  He continues to be on atorvastatin 80 mg with target LDL less than 70.  In addition he is also on Abilify 15 mg in addition to Wellbutrin SR for depression.    I last saw him on November 22, 2019.  He was no longer on Entresto due to low blood pressure and was only on low-dose carvedilol 3.125 mg twice a day in addition to furosemide and spironolactone.  I recommended initiation of low-dose Lanoxin at 0.125 mg for 4 days then 0.0625 mg daily and attempted to slightly titrate carvedilol to 4.375 mg twice a day.  I referred him to EP for consideration of prophylactic ICD implantation to reduce potential sudden cardiac arrhythmic death.  Saw Dr. Jolyn Nap and on December 12, 2019 underwent successful implantation of a [place] ICD device.  He tolerated the procedure well.  Presently he denies any chest pain or palpitations.  He will be undergoing a wound check of his device on February 11.  He presents for reevaluation.  Past Medical History:  Diagnosis Date  . Barrett's esophagus 04/21/2018  . CAD (coronary artery disease)    a. s/p STEMI in 01/2019 with DES to proximal LAD  . CHF (congestive heart failure) ([place])    a. EF 35-40% by echo in 01/2019, at 25-30% by repeat echo in 05/2019 and 08/2019  . Depression   . High cholesterol   . Hypertension   . Hypothyroid   . Sleep apnea    wears CPAP at home    Past Surgical History:  Procedure Laterality Date  . BIOPSY  05/24/2018   Procedure: BIOPSY;  Surgeon: Rogene Houston, MD;   [facility];  Service: Endoscopy;;  gastric  . COLONOSCOPY    . COLONOSCOPY N/A 05/24/2018   Procedure: COLONOSCOPY;  Surgeon: Rogene Houston, MD;  [facility];  Service: Endoscopy;  Laterality: N/A;  8:55  . COLONOSCOPY WITH ESOPHAGOGASTRODUODENOSCOPY (EGD)    . CORONARY STENT INTERVENTION N/A 01/26/2019   Procedure: CORONARY STENT INTERVENTION;  Surgeon: Troy Sine, MD;  Location: [place] [facility];  Service: Cardiovascular;  Laterality: N/A;  . CORONARY/GRAFT ACUTE MI REVASCULARIZATION N/A 01/26/2019   Procedure: Coronary/Graft Acute MI Revascularization;  Surgeon: Troy Sine, MD;  Location: [place] [facility];  Service: Cardiovascular;  Laterality: N/A;  . ESOPHAGOGASTRODUODENOSCOPY N/A 05/24/2018   Procedure: ESOPHAGOGASTRODUODENOSCOPY (EGD);  Surgeon: Rogene Houston,  MD;  [facility];  Service: Endoscopy;  Laterality: N/A;  . ICD IMPLANT N/A 12/12/2019   Procedure: ICD IMPLANT;  Surgeon: Deboraha Sprang, MD;  Location: [place] [facility];  Service: Cardiovascular;  Laterality: N/A;  . LEFT HEART CATH AND CORONARY ANGIOGRAPHY N/A 01/26/2019   Procedure: LEFT HEART CATH AND CORONARY ANGIOGRAPHY;  Surgeon: Troy Sine, MD;  Location: [place] [facility];  Service: Cardiovascular;  Laterality: N/A;  . MEDIAL PARTIAL KNEE REPLACEMENT     2015  . POLYPECTOMY  05/24/2018   Procedure: POLYPECTOMY;  Surgeon: Rogene Houston, MD;  [facility];  Service: Endoscopy;;  colon  . rt hand injury    . shoulder surgery for bursitis    . TONSILLECTOMY      Current Medications: Outpatient Medications Prior to Visit  Medication Sig Dispense Refill  . acetaminophen (TYLENOL) 325 MG tablet Take 2 tablets (650 mg total) by mouth every 6 (six) hours as needed for mild pain, fever or headache (or Fever >/= 101). 12 tablet 0  . albuterol (VENTOLIN HFA) 108 (90 Base) MCG/ACT inhaler Inhale 2 puffs into the lungs every 6 (six) hours as needed for  wheezing or shortness of breath. 18 g 1  . ARIPiprazole (ABILIFY) 15 MG tablet Take 15 mg by mouth at bedtime.    Marland Kitchen aspirin 81 MG chewable tablet Chew 1 tablet (81 mg total) by mouth daily.    Marland Kitchen atorvastatin (LIPITOR) 80 MG tablet Take 1 tablet (80 mg total) by mouth daily at 6 PM. 90 tablet 3  . buPROPion (WELLBUTRIN SR) 150 MG 12 hr tablet Take 150-300 mg by mouth See admin instructions. Take two tablets (300 mg) by mouth every morning and one tablet (150 mg) with lunch  0  . clopidogrel (PLAVIX) 75 MG tablet Take 75 mg by mouth daily.    . digoxin (LANOXIN) 0.125 MG tablet Take a whole pill for 4 days then 1/2 pill  after. (Patient taking differently: Take 0.0625 mg by mouth daily. ) 90 tablet 3  . Eszopiclone 3 MG TABS Take 3 mg by mouth at bedtime.    . folic acid (FOLVITE) 1 MG tablet Take 1 tablet (1 mg total) by mouth daily. 30 tablet 2  . levothyroxine (SYNTHROID) 125 MCG tablet Take 125 mcg by mouth daily before breakfast.     . Multiple Vitamin (MULTIVITAMIN WITH MINERALS) TABS tablet Take 1 tablet by mouth every morning.    . nitroGLYCERIN (NITROSTAT) 0.4 MG SL tablet Place 1 tablet (0.4 mg total) under the tongue every 5 (five) minutes x 3 doses as needed for chest pain. 25 tablet 3  . pantoprazole (PROTONIX) 40 MG tablet Take 1 tablet (40 mg total) by mouth daily. 30 tablet 1  . QUEtiapine (SEROQUEL) 100 MG tablet Take 50 mg by mouth at bedtime.     Marland Kitchen VIIBRYD 40 MG TABS Take 40 mg by mouth at bedtime.     . carvedilol (COREG) 3.125 MG tablet Take 1 and 1/2 pills twice a day 90 tablet 3  . furosemide (LASIX) 40 MG tablet Take 1 tablet (40 mg total) by mouth 2 (two) times daily. In early morning and early afternoon. (Patient taking differently: Take 40 mg by mouth daily. ) 60 tablet 2  . spironolactone (ALDACTONE) 25 MG tablet Take 0.5 tablets (12.5 mg total) by mouth daily. 45 tablet 1  . potassium chloride SA (KLOR-CON) 20 MEQ tablet Take 2 tablets (40 mEq total) by  mouth daily. 60  tablet 0   No facility-administered medications prior to visit.     Allergies:   Patient has no known allergies.   Social History   Socioeconomic History  . Marital status: Widowed    Spouse name: Not on file  . Number of children: Not on file  . Years of education: Not on file  . Highest education level: Not on file  Occupational History  . Not on file  Tobacco Use  . Smoking status: Former Smoker    Packs/day: 0.25    Years: 40.00    Pack years: 10.00    Types: Cigarettes  . Smokeless tobacco: Never Used  Substance and Sexual Activity  . Alcohol use: Yes    Comment: very rare  . Drug use: Never  . Sexual activity: Not on file  Other Topics Concern  . Not on file  Social History Narrative  . Not on file   Social Determinants of Health   Financial Resource Strain:   . Difficulty of Paying Living Expenses: Not on file  Food Insecurity:   . Worried About Charity fundraiser in the Last Year: Not on file  . Ran Out of Food in the Last Year: Not on file  Transportation Needs:   . Lack of Transportation (Medical): Not on file  . Lack of Transportation (Non-Medical): Not on file  Physical Activity:   . Days of Exercise per Week: Not on file  . Minutes of Exercise per Session: Not on file  Stress:   . Feeling of Stress : Not on file  Social Connections:   . Frequency of Communication with Friends and Family: Not on file  . Frequency of Social Gatherings with Friends and Family: Not on file  . Attends Religious Services: Not on file  . Active Member of Clubs or Organizations: Not on file  . Attends Archivist Meetings: Not on file  . Marital Status: Not on file     Family History:  The patient's is negative for colon CA  ROS General: Negative; No fevers, chills, or night sweats;  HEENT: Negative; No changes in vision or hearing, sinus congestion, difficulty swallowing Pulmonary: Negative; No cough, wheezing, shortness of breath,  hemoptysis Cardiovascular: See HPI  GI: Negative; No nausea, vomiting, diarrhea, or abdominal pain GU: Negative; No dysuria, hematuria, or difficulty voiding Musculoskeletal: Negative; no myalgias, joint pain, or weakness Hematologic/Oncology: Negative; no easy bruising, bleeding Endocrine: Negative; no heat/cold intolerance; no diabetes Neuro: Negative; no changes in balance, headaches Skin: Negative; No rashes or skin lesions Psychiatric: Positive for depression Sleep: Negative; No snoring, daytime sleepiness, hypersomnolence, bruxism, restless legs, hypnogognic hallucinations, no cataplexy Other comprehensive 14 point system review is negative.   PHYSICAL EXAM:   VS:  BP 98/72   Pulse 91   Temp 97.9 F (36.6 C)   Ht '5\' 10"'  (1.778 m)   Wt 215 lb 12.8 oz (97.9 kg)   SpO2 95%   BMI 30.96 kg/m     Pete blood pressure by me 100/70 supine and 100/68 standing  Wt Readings from Last 3 Encounters:  12/21/19 215 lb 12.8 oz (97.9 kg)  12/12/19 218 lb (98.9 kg)  12/02/19 225 lb (102.1 kg)    General: Alert, oriented, no distress.  Skin: normal turgor, no rashes, warm and dry HEENT: Normocephalic, atraumatic. Pupils equal round and reactive to light; sclera anicteric; extraocular muscles intact; Nose without nasal septal hypertrophy Mouth/Parynx benign; Mallinpatti scale 3 Neck: No JVD, no  carotid bruits; normal carotid upstroke Lungs: clear to ausculatation and percussion; no wheezing or rales Chest wall: without tenderness to palpitation; ICD device implanted left subclavicular region Heart: PMI not displaced, RRR, s1 s2 normal, 1/6 systolic murmur, no diastolic murmur, no rubs, gallops, thrills, or heaves Abdomen: soft, nontender; no hepatosplenomehaly, BS+; abdominal aorta nontender and not dilated by palpation. Back: no CVA tenderness Pulses 2+ Musculoskeletal: full range of motion, normal strength, no joint deformities Extremities: no clubbing cyanosis or edema, Homan's sign  negative  Neurologic: grossly nonfocal; Cranial nerves grossly wnl Psychologic: Normal mood and affect    Studies/Labs Reviewed:   EKG:  EKG is ordered today. ECG (independently read by me): Normal sinus rhythm at 88 bpm.  Left atrial enlargement.  QS complex anteriorly consistent with prior anterior MI.  Inferior Q waves.  Nonspecific interventricular block.  QTc interval 41 ms.  November 22, 2019 ECG (independently read by me): Normal sinus rhythm at 90 bpm.  QS complex V1 through V6 as well as inferiorly consistent with extensive prior MI.  QTc interval 44 ms.  Left axis deviation  August 29, 2019 ECG (independently read by me):  NSR at 88; IVCD, Inferior Q waves and QS V1-6.  July 08, 2019 ECG (independently read by me): NSR at 82; left axis deviation, QS complex anterolaterally consistent with an anterior MI.  Inferior Q waves.    Recent Labs: BMP Latest Ref Rng & Units 12/14/2019 12/02/2019 11/14/2019  Glucose 65 - 99 mg/dL 101(H) 123(H) 93  BUN 8 - 27 mg/dL 13 15 26(H)  Creatinine 0.76 - 1.27 mg/dL 1.07 1.21 1.35(H)  BUN/Creat Ratio 10 - 24 12 - -  Sodium 134 - 144 mmol/L 143 141 142  Potassium 3.5 - 5.2 mmol/L 4.2 3.8 3.5  Chloride 96 - 106 mmol/L 106 106 106  CO2 20 - 29 mmol/L '20 24 25  ' Calcium 8.6 - 10.2 mg/dL 9.9 9.5 9.7     Hepatic Function Latest Ref Rng & Units 12/14/2019 11/13/2019 10/22/2019  Total Protein 6.0 - 8.5 g/dL 6.1 6.6 6.6  Albumin 3.8 - 4.8 g/dL 4.2 3.8 3.9  AST 0 - 40 IU/L 12 14(L) 17  ALT 0 - 44 IU/L '8 16 21  ' Alk Phosphatase 39 - 117 IU/L 89 69 63  Total Bilirubin 0.0 - 1.2 mg/dL 0.9 1.8(H) 1.5(H)  Bilirubin, Direct 0.00 - 0.40 mg/dL - - -    CBC Latest Ref Rng & Units 12/14/2019 12/02/2019 11/13/2019  WBC 3.4 - 10.8 x10E3/uL 8.1 8.3 7.4  Hemoglobin 13.0 - 17.7 g/dL 12.8(L) 14.0 12.3(L)  Hematocrit 37.5 - 51.0 % 39.0 44.4 38.3(L)  Platelets 150 - 450 x10E3/uL 183 230 176   Lab Results  Component Value Date   MCV 91 12/14/2019   MCV 96.1 12/02/2019    MCV 96.5 11/13/2019   Lab Results  Component Value Date   TSH 4.100 12/14/2019   Lab Results  Component Value Date   HGBA1C 6.7 (H) 09/11/2019     BNP    Component Value Date/Time   BNP 438.6 (H) 12/14/2019 1048   BNP 656.0 (H) 12/02/2019 1146    ProBNP    Component Value Date/Time   PROBNP 992 (H) 07/26/2019 1102     Lipid Panel     Component Value Date/Time   CHOL 120 12/14/2019 1048   TRIG 133 12/14/2019 1048   HDL 33 (L) 12/14/2019 1048   CHOLHDL 3.6 12/14/2019 1048   CHOLHDL 5.6 01/26/2019 2056   VLDL  38 01/26/2019 2056   [place] 63 12/14/2019 1048     RADIOLOGY: DG Chest 2 View  Result Date: 12/12/2019 CLINICAL DATA:  Shortness of breath. EXAM: CHEST - 2 VIEW COMPARISON:  December 02, 2019. FINDINGS: Stable cardiomediastinal silhouette. Central pulmonary vascular congestion and bilateral pulmonary edema is again noted. Small left pleural effusions are noted as well. Interval placement of single lead left-sided pacemaker with lead in grossly good position. No pneumothorax is noted. Bony thorax is unremarkable. IMPRESSION: Interval placement of single lead left-sided pacemaker with lead in grossly good position. Stable bilateral pulmonary edema and pleural effusions are noted. Electronically Signed   By: Marijo Conception M.D.   On: 12/12/2019 16:18   DG Chest Port 1 View  Result Date: 12/02/2019 CLINICAL DATA:  Shortness of breath. EXAM: PORTABLE CHEST 1 VIEW COMPARISON:  November 14, 2019. FINDINGS: Stable cardiomegaly. Mild central pulmonary vascular congestion is noted. No pneumothorax is noted. Increased bibasilar edema or atelectasis is noted with small bilateral pleural effusions. Bony thorax is unremarkable. IMPRESSION: Stable cardiomegaly with mild central pulmonary vascular congestion. Increased bibasilar edema or atelectasis is noted with small bilateral pleural effusions. Electronically Signed   By: Marijo Conception M.D.   On: 12/02/2019 11:58   CUP PACEART  INCLINIC DEVICE CHECK  Result Date: 12/22/2019 Wound check appointment. Steri-strips removed. Wound without redness or edema. Incision edges approximated, wound well healed. Normal device function. Thresholds, sensing, and impedances consistent with implant measurements. Device programmed at 3.5V for  extra safety margin until 03/13/20. Histogram distribution appropriate for patient and level of activity. No mode switches or ventricular arrhythmias noted. Patient educated about wound care, arm mobility, lifting restrictions, shock plan. ROV 03/13/20 with  Dr. Caryl Comes.Lavenia Atlas, BSN, RN    Additional studies/ records that were reviewed today include:  Echo 01/27/2019 1. The left ventricle has moderately reduced systolic function, with an ejection fraction of 35-40%. There is mildly increased left ventricular wall thickness. 2. Severe akinesis of the left ventricular, mid-apical anteroseptal wall, anterior wall, apical segment and inferoapical wall. 3. Small, fixed thrombus on the anteroapical wall of the left ventricle.   Cath 01/26/2019  Prox RCA lesion is 20% stenosed.  Prox Cx lesion is 20% stenosed.  Prox LAD-1 lesion is 100% stenosed.  Post intervention, there is a 0% residual stenosis.  Prox LAD-2 lesion is 20% stenosed.  Mid LAD lesion is 40% stenosed.  LV end diastolic pressure is mildly elevated.  There is moderate left ventricular systolic dysfunction.  A stent was successfully placed.  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  Almost a common ostium left main with immediate trifurcation into the LAD, small ramus intermediate vessel and moderate size circumflex vessel. The ramus vessel is normal. The circumflex vessel has 20% proximal narrowing and gave rise to 2 marginal branches. The RCA is a dominant vessel with moderate  luminal irregularity with 20% narrowing proximally 40% mid stenosis and 30% narrowing proximal to the acute margin.  Probable moderate acute LV dysfunction with hypocontractility involving the anterolateral wall and apex. LVEDP 22 mm.  Successful PCI to the totally occluded LAD with PTCA and ultimate insertion of a 3.0 x 22 mm Resolute Onyx stent postdilated to 3.26 mm with the 100% occlusion being reduced to 0% and TIMI 0 flow improving to TIMI-3 flow. Once reperfusion was established, LAD had 20% mid LAD smooth narrowing and  40% distal smooth stenosis. The LAD wrapped around the apex and supplied the distal third of the inferior wall.  RECOMMENDATION: DAPT for minimum of 1 year. Optimal blood pressure control with ideal blood pressure less than 120/80. High potency statin therapy with target LDL less than 70. Medical therapy for concomitant CAD. A 2D echo Doppler study will be ordered improved LV function assessment.    Intervention       ECHO 08/17/2019 1. Left ventricular ejection fraction, by visual estimation, is 25 to 30%. The left ventricle has severely decreased function. There is no left ventricular hypertrophy. 2. Left ventricular diastolic Doppler parameters are consistent with pseudonormalization pattern of LV diastolic filling. 3. There is akinesis of the mid- distal anterioseptal wall, apex and apical lateral regions. There is spontaneous contrast in apical LV . Definity contrast was used. There is no evidence of apical thrombi. 4. Global right ventricle has normal systolic function.The right ventricular size is normal. No increase in right ventricular wall thickness. 5. Left atrial size was mildly dilated. 6. Right atrial size was normal. 7. The mitral valve is normal in structure. Moderate mitral valve regurgitation. 8. The tricuspid valve is normal in structure. Tricuspid valve regurgitation is trivial. 9. The aortic valve is normal in structure. Aortic  valve regurgitation was not visualized by color flow Doppler. 10. The pulmonic valve was grossly normal. Pulmonic valve regurgitation is not visualized by color flow Doppler. 11. Aortic dilatation noted. 12. There is mild dilatation of the ascending aorta measuring 39 mm. 13. Normal pulmonary artery systolic pressure. 14. The atrial septum is grossly normal.   ASSESSMENT:    1. Ischemic cardiomyopathy   2. CAD in native artery   3. ST elevation myocardial infarction involving left anterior descending (LAD) coronary artery ([place])   4. Chronic combined systolic and diastolic heart failure ([place])   5. Medication management   6. Hypothyroidism, unspecified type     PLAN:  Tony Alvarez is a 64 year old gentleman who developed new onset chest pain and was found to have acute anterior myocardial infarction on January 27, 2019.  He underwent emergent cardiac catheterization and successful intervention to a totally occluded proximal LAD.  He had concomitant CAD and his LAD was successfully stented with ultimate insertion of a 3.0 x 22 mm Resolute Onyx DES stent postdilated 3.26 mm.  There was acute LV dysfunction with EF at 35% and was also found to have a small apical mural thrombus for which he initially was started on triple drug therapy with aspirin/Plavix in addition to Xarelto.  I had not seen him since his initial evaluation until he presented to the office to see me in August 2020.  At that time, I recommended he discontinue aspirin and continue on Plavix and Xarelto to reduce potential bleed risk.  He was on initial dose Entresto and his dose was further titrated to 49/51 mg twice a day.  Due to  low blood pressure I had recommended he decrease his Lasix and only take this on an as-needed basis.  He had several repeat hospitalizations throughout November and December and seen by me in January 2021 was on significantly reduced medical regimen and was no longer on Entresto.  He underwent  successful implantation of a [place] ICD device by Dr. Caryl Comes on December 12, 2019.  Presently he has been on a medical regimen of carvedilol 4.1875 mg twice a day, digoxin 0.0625 mg daily, furosemide 40 mg twice a day and spironolactone 12.5 mg daily.  His resting pulse is in the 90s.  I have recommended we try to increase carvedilol to 6.25 mg twice a day and with his low blood pressure have recommended reducing furosemide to 40 mg daily.  With his LV dysfunction he would benefit from slight additional titration of spironolactone if possible and I will try increasing this to 12.5 mg twice a day however symptoms of low blood pressure developed this will not be increased.  He continues to be on levothyroxine for hypothyroidism and pantoprazole for GERD.  I am recommending he undergo a follow-up echo Doppler study in April with plans for follow-up office visit in May.   Medication Adjustments/Labs and Tests Ordered: Current medicines are reviewed at length with the patient today.  Concerns regarding medicines are outlined above.  Medication changes, Labs and Tests ordered today are listed in the Patient Instructions below. Patient Instructions  Medication Instructions:  INCREASE CARVEDILOL TO 6.25 MG TWICE A DAY (1 TABLET TWICE A DAY) INCREASE SPIRONOLACTONE TO 12.5MG TWICE A DAY (1/2 TABLET TWICE A DAY) DECREASE LASIX TO 40MG ONCE DAILY (1 TABLET DAILY)  *If you need a refill on your cardiac medications before your next appointment, please call your pharmacy*  Lab Work: [facility] [place] If you have labs (blood work) drawn today and your tests are completely normal, you will receive your results only by: Marland Kitchen MyChart Message (if you have MyChart) OR . A paper copy in the mail If you have any lab test that is abnormal or we need to change your treatment, we will call you to review the results.  Testing/Procedures: in april Your physician has requested that you have an echocardiogram.  Echocardiography is a painless test that uses sound waves to create images of your heart. It provides your doctor with information about the size and shape of your heart and how well your heart's chambers and valves are working. This procedure takes approximately one hour. There are no restrictions for this procedure. [place]  Follow-Up: At [facility], you and your health needs are our priority.  As part of our continuing mission to provide you with exceptional heart care, we have created designated Provider Care Teams.  These Care Teams include your primary Cardiologist (physician) and Advanced Practice Providers (APPs -  Physician Assistants and Nurse Practitioners) who all work together to provide you with the care you need, when you need it.  Your next appointment: IN MAY  3 month(s)  The format for your next appointment:   In Person  Provider:   Shelva Majestic, MD     Signed, Shelva Majestic, MD  12/27/2019 6:43 PM    [place] [address], [place], [place], [place]  [address] Phone: [phone]

## 2019-12-21 NOTE — Patient Instructions (Addendum)
Medication Instructions:  INCREASE CARVEDILOL TO 6.25 MG TWICE A DAY (1 TABLET TWICE A DAY) INCREASE SPIRONOLACTONE TO 12.5MG  TWICE A DAY (1/2 TABLET TWICE A DAY) DECREASE LASIX TO 40MG  ONCE DAILY (1 TABLET DAILY)  *If you need a refill on your cardiac medications before your next appointment, please call your pharmacy*  Lab Work: [facility] [place] If you have labs (blood work) drawn today and your tests are completely normal, you will receive your results only by: Marland Kitchen MyChart Message (if you have MyChart) OR . A paper copy in the mail If you have any lab test that is abnormal or we need to change your treatment, we will call you to review the results.  Testing/Procedures: in april Your physician has requested that you have an echocardiogram. Echocardiography is a painless test that uses sound waves to create images of your heart. It provides your doctor with information about the size and shape of your heart and how well your heart's chambers and valves are working. This procedure takes approximately one hour. There are no restrictions for this procedure. [place]  Follow-Up: At [facility], you and your health needs are our priority.  As part of our continuing mission to provide you with exceptional heart care, we have created designated Provider Care Teams.  These Care Teams include your primary Cardiologist (physician) and Advanced Practice Providers (APPs -  Physician Assistants and Nurse Practitioners) who all work together to provide you with the care you need, when you need it.  Your next appointment: IN MAY  3 month(s)  The format for your next appointment:   In Person  Provider:   Shelva Majestic, MD

## 2019-12-22 ENCOUNTER — Ambulatory Visit (INDEPENDENT_AMBULATORY_CARE_PROVIDER_SITE_OTHER): Payer: BC Managed Care – PPO | Admitting: *Deleted

## 2019-12-22 DIAGNOSIS — I255 Ischemic cardiomyopathy: Secondary | ICD-10-CM

## 2019-12-22 DIAGNOSIS — Z95810 Presence of automatic (implantable) cardiac defibrillator: Secondary | ICD-10-CM

## 2019-12-22 DIAGNOSIS — I5023 Acute on chronic systolic (congestive) heart failure: Secondary | ICD-10-CM

## 2019-12-22 LAB — CUP PACEART INCLINIC DEVICE CHECK
Battery Remaining Longevity: 105 mo
Brady Statistic RV Percent Paced: 0 %
Date Time Interrogation Session: 20210211094721
HighPow Impedance: 59.625
Implantable Lead Implant Date: 20210201
Implantable Lead Location: 753860
Implantable Pulse Generator Implant Date: 20210201
Lead Channel Impedance Value: 425 Ohm
Lead Channel Pacing Threshold Amplitude: 0.75 V
Lead Channel Pacing Threshold Amplitude: 0.75 V
Lead Channel Pacing Threshold Pulse Width: 0.5 ms
Lead Channel Pacing Threshold Pulse Width: 0.5 ms
Lead Channel Sensing Intrinsic Amplitude: 11.9 mV
Lead Channel Setting Pacing Amplitude: 3.5 V
Lead Channel Setting Pacing Pulse Width: 0.5 ms
Lead Channel Setting Sensing Sensitivity: 0.5 mV
Pulse Gen Serial Number: 111005287

## 2019-12-22 NOTE — Progress Notes (Signed)
Wound check appointment. Steri-strips removed. Wound without redness or edema. Incision edges approximated, wound well healed. Normal device function. Thresholds, sensing, and impedances consistent with implant measurements. Device programmed at 3.5V for extra safety margin until 03/13/20. Histogram distribution appropriate for patient and level of activity. No mode switches or ventricular arrhythmias noted. Patient educated about wound care, arm mobility, lifting restrictions, shock plan. ROV 03/13/20 with Dr. Caryl Comes.

## 2019-12-23 ENCOUNTER — Telehealth: Payer: Self-pay | Admitting: Cardiovascular Disease

## 2019-12-23 NOTE — Telephone Encounter (Signed)
Returned call to pt he states that he became SOB 9am this morning, he denies cough, any swelling and any weight gain he states that his weight is the same as yesterday and denies any weight gain since Dr Claiborne Billings decreased lasix from 80mg  to 40mg  on last visit. I can hear that pt is SOB while talking on the phone. His creatinine was normal 12-02-19. He will increase his lasix to 60mg  1-1/2 tab x2 days. He will call back if sx increase or worsen. He will call back over the weekend if he feels that he needs to. He will go to the ER if he feels needed.

## 2019-12-23 NOTE — Telephone Encounter (Signed)
Pt c/o Shortness Of Breath: STAT if SOB developed within the last 24 hours or pt is noticeably SOB on the phone  1. Are you currently SOB (can you hear that pt is SOB on the phone)? No  2. How long have you been experiencing SOB? About 48 hours  3. Are you SOB when sitting or when up moving around? Both  4. Are you currently experiencing any other symptoms? No

## 2019-12-27 ENCOUNTER — Encounter: Payer: Self-pay | Admitting: Cardiovascular Disease

## 2020-02-02 ENCOUNTER — Inpatient Hospital Stay (HOSPITAL_COMMUNITY): Payer: BC Managed Care – PPO

## 2020-02-02 ENCOUNTER — Inpatient Hospital Stay: Payer: Self-pay

## 2020-02-02 ENCOUNTER — Inpatient Hospital Stay (HOSPITAL_COMMUNITY)
Admission: EM | Admit: 2020-02-02 | Discharge: 2020-02-14 | DRG: 291 | Disposition: A | Discharge status: Hospice | Payer: BC Managed Care – PPO | Attending: Internal Medicine | Admitting: Internal Medicine

## 2020-02-02 ENCOUNTER — Other Ambulatory Visit: Payer: Self-pay

## 2020-02-02 ENCOUNTER — Emergency Department (HOSPITAL_COMMUNITY): Payer: BC Managed Care – PPO

## 2020-02-02 DIAGNOSIS — Z66 Do not resuscitate: Secondary | ICD-10-CM

## 2020-02-02 DIAGNOSIS — I77810 Thoracic aortic ectasia: Secondary | ICD-10-CM | POA: Diagnosis present

## 2020-02-02 DIAGNOSIS — R748 Abnormal levels of other serum enzymes: Secondary | ICD-10-CM | POA: Diagnosis present

## 2020-02-02 DIAGNOSIS — G9341 Metabolic encephalopathy: Secondary | ICD-10-CM | POA: Diagnosis present

## 2020-02-02 DIAGNOSIS — E785 Hyperlipidemia, unspecified: Secondary | ICD-10-CM | POA: Diagnosis present

## 2020-02-02 DIAGNOSIS — Z955 Presence of coronary angioplasty implant and graft: Secondary | ICD-10-CM | POA: Diagnosis not present

## 2020-02-02 DIAGNOSIS — I2102 ST elevation (STEMI) myocardial infarction involving left anterior descending coronary artery: Secondary | ICD-10-CM

## 2020-02-02 DIAGNOSIS — Z79890 Hormone replacement therapy (postmenopausal): Secondary | ICD-10-CM

## 2020-02-02 DIAGNOSIS — R570 Cardiogenic shock: Secondary | ICD-10-CM | POA: Diagnosis present

## 2020-02-02 DIAGNOSIS — Z79899 Other long term (current) drug therapy: Secondary | ICD-10-CM

## 2020-02-02 DIAGNOSIS — R4587 Impulsiveness: Secondary | ICD-10-CM | POA: Diagnosis not present

## 2020-02-02 DIAGNOSIS — E876 Hypokalemia: Secondary | ICD-10-CM | POA: Diagnosis present

## 2020-02-02 DIAGNOSIS — I5021 Acute systolic (congestive) heart failure: Secondary | ICD-10-CM | POA: Diagnosis not present

## 2020-02-02 DIAGNOSIS — I5043 Acute on chronic combined systolic (congestive) and diastolic (congestive) heart failure: Secondary | ICD-10-CM | POA: Diagnosis present

## 2020-02-02 DIAGNOSIS — F329 Major depressive disorder, single episode, unspecified: Secondary | ICD-10-CM | POA: Diagnosis present

## 2020-02-02 DIAGNOSIS — E872 Acidosis: Secondary | ICD-10-CM | POA: Diagnosis present

## 2020-02-02 DIAGNOSIS — I110 Hypertensive heart disease with heart failure: Principal | ICD-10-CM

## 2020-02-02 DIAGNOSIS — I5023 Acute on chronic systolic (congestive) heart failure: Secondary | ICD-10-CM | POA: Diagnosis not present

## 2020-02-02 DIAGNOSIS — Z9861 Coronary angioplasty status: Secondary | ICD-10-CM

## 2020-02-02 DIAGNOSIS — Z95820 Peripheral vascular angioplasty status with implants and grafts: Secondary | ICD-10-CM

## 2020-02-02 DIAGNOSIS — R7402 Elevation of levels of lactic acid dehydrogenase (LDH): Secondary | ICD-10-CM | POA: Diagnosis not present

## 2020-02-02 DIAGNOSIS — Z95810 Presence of automatic (implantable) cardiac defibrillator: Secondary | ICD-10-CM

## 2020-02-02 DIAGNOSIS — D649 Anemia, unspecified: Secondary | ICD-10-CM | POA: Diagnosis present

## 2020-02-02 DIAGNOSIS — D696 Thrombocytopenia, unspecified: Secondary | ICD-10-CM | POA: Diagnosis not present

## 2020-02-02 DIAGNOSIS — I252 Old myocardial infarction: Secondary | ICD-10-CM | POA: Diagnosis not present

## 2020-02-02 DIAGNOSIS — R7401 Elevation of levels of liver transaminase levels: Secondary | ICD-10-CM | POA: Diagnosis present

## 2020-02-02 DIAGNOSIS — R791 Abnormal coagulation profile: Secondary | ICD-10-CM | POA: Diagnosis present

## 2020-02-02 DIAGNOSIS — G4733 Obstructive sleep apnea (adult) (pediatric): Secondary | ICD-10-CM | POA: Diagnosis present

## 2020-02-02 DIAGNOSIS — I361 Nonrheumatic tricuspid (valve) insufficiency: Secondary | ICD-10-CM | POA: Diagnosis not present

## 2020-02-02 DIAGNOSIS — R4182 Altered mental status, unspecified: Secondary | ICD-10-CM | POA: Diagnosis present

## 2020-02-02 DIAGNOSIS — N400 Enlarged prostate without lower urinary tract symptoms: Secondary | ICD-10-CM | POA: Diagnosis not present

## 2020-02-02 DIAGNOSIS — Z7902 Long term (current) use of antithrombotics/antiplatelets: Secondary | ICD-10-CM

## 2020-02-02 DIAGNOSIS — Z20822 Contact with and (suspected) exposure to covid-19: Secondary | ICD-10-CM | POA: Diagnosis present

## 2020-02-02 DIAGNOSIS — Z7189 Other specified counseling: Secondary | ICD-10-CM

## 2020-02-02 DIAGNOSIS — G9340 Encephalopathy, unspecified: Secondary | ICD-10-CM | POA: Diagnosis present

## 2020-02-02 DIAGNOSIS — R5383 Other fatigue: Secondary | ICD-10-CM | POA: Diagnosis not present

## 2020-02-02 DIAGNOSIS — F1010 Alcohol abuse, uncomplicated: Secondary | ICD-10-CM | POA: Diagnosis present

## 2020-02-02 DIAGNOSIS — Z7901 Long term (current) use of anticoagulants: Secondary | ICD-10-CM | POA: Diagnosis not present

## 2020-02-02 DIAGNOSIS — I255 Ischemic cardiomyopathy: Secondary | ICD-10-CM | POA: Diagnosis present

## 2020-02-02 DIAGNOSIS — D6959 Other secondary thrombocytopenia: Secondary | ICD-10-CM | POA: Diagnosis present

## 2020-02-02 DIAGNOSIS — K2270 Barrett's esophagus without dysplasia: Secondary | ICD-10-CM | POA: Diagnosis present

## 2020-02-02 DIAGNOSIS — I509 Heart failure, unspecified: Secondary | ICD-10-CM

## 2020-02-02 DIAGNOSIS — I081 Rheumatic disorders of both mitral and tricuspid valves: Secondary | ICD-10-CM | POA: Diagnosis present

## 2020-02-02 DIAGNOSIS — I340 Nonrheumatic mitral (valve) insufficiency: Secondary | ICD-10-CM | POA: Diagnosis not present

## 2020-02-02 DIAGNOSIS — R443 Hallucinations, unspecified: Secondary | ICD-10-CM | POA: Diagnosis not present

## 2020-02-02 DIAGNOSIS — Z86718 Personal history of other venous thrombosis and embolism: Secondary | ICD-10-CM | POA: Diagnosis not present

## 2020-02-02 DIAGNOSIS — I2510 Atherosclerotic heart disease of native coronary artery without angina pectoris: Secondary | ICD-10-CM | POA: Diagnosis present

## 2020-02-02 DIAGNOSIS — Z960 Presence of urogenital implants: Secondary | ICD-10-CM | POA: Diagnosis not present

## 2020-02-02 DIAGNOSIS — Z87891 Personal history of nicotine dependence: Secondary | ICD-10-CM

## 2020-02-02 DIAGNOSIS — Z7982 Long term (current) use of aspirin: Secondary | ICD-10-CM

## 2020-02-02 DIAGNOSIS — Z96659 Presence of unspecified artificial knee joint: Secondary | ICD-10-CM | POA: Diagnosis present

## 2020-02-02 DIAGNOSIS — Z515 Encounter for palliative care: Secondary | ICD-10-CM | POA: Diagnosis not present

## 2020-02-02 DIAGNOSIS — N179 Acute kidney failure, unspecified: Secondary | ICD-10-CM | POA: Diagnosis present

## 2020-02-02 DIAGNOSIS — I5084 End stage heart failure: Secondary | ICD-10-CM | POA: Diagnosis present

## 2020-02-02 DIAGNOSIS — Z9119 Patient's noncompliance with other medical treatment and regimen: Secondary | ICD-10-CM

## 2020-02-02 DIAGNOSIS — E039 Hypothyroidism, unspecified: Secondary | ICD-10-CM | POA: Diagnosis present

## 2020-02-02 LAB — PROTIME-INR
INR: 1.8 — ABNORMAL HIGH (ref 0.8–1.2)
Prothrombin Time: 21 seconds — ABNORMAL HIGH (ref 11.4–15.2)

## 2020-02-02 LAB — CBC WITH DIFFERENTIAL/PLATELET
Abs Immature Granulocytes: 0.08 10*3/uL — ABNORMAL HIGH (ref 0.00–0.07)
Abs Immature Granulocytes: 0.04 10*3/uL (ref 0.00–0.07)
Basophils Absolute: 0.1 10*3/uL (ref 0.0–0.1)
Basophils Absolute: 0.1 10*3/uL (ref 0.0–0.1)
Basophils Relative: 1 %
Basophils Relative: 1 %
Eosinophils Absolute: 0.1 10*3/uL (ref 0.0–0.5)
Eosinophils Absolute: 0.2 10*3/uL (ref 0.0–0.5)
Eosinophils Relative: 1 %
Eosinophils Relative: 2 %
HCT: 45.3 % (ref 39.0–52.0)
HCT: 39 % (ref 39.0–52.0)
Hemoglobin: 14.3 g/dL (ref 13.0–17.0)
Hemoglobin: 12.4 g/dL — ABNORMAL LOW (ref 13.0–17.0)
Immature Granulocytes: 1 %
Immature Granulocytes: 0 %
Lymphocytes Relative: 10 %
Lymphocytes Relative: 10 %
Lymphs Abs: 1.2 10*3/uL (ref 0.7–4.0)
Lymphs Abs: 1.1 10*3/uL (ref 0.7–4.0)
MCH: 28.4 pg (ref 26.0–34.0)
MCH: 27.3 pg (ref 26.0–34.0)
MCHC: 31.6 g/dL (ref 30.0–36.0)
MCHC: 31.8 g/dL (ref 30.0–36.0)
MCV: 90.1 fL (ref 80.0–100.0)
MCV: 85.9 fL (ref 80.0–100.0)
Monocytes Absolute: 0.8 10*3/uL (ref 0.1–1.0)
Monocytes Absolute: 0.5 10*3/uL (ref 0.1–1.0)
Monocytes Relative: 7 %
Monocytes Relative: 5 %
Neutro Abs: 8.8 10*3/uL — ABNORMAL HIGH (ref 1.7–7.7)
Neutro Abs: 8.3 10*3/uL — ABNORMAL HIGH (ref 1.7–7.7)
Neutrophils Relative %: 80 %
Neutrophils Relative %: 82 %
Platelets: 207 10*3/uL (ref 150–400)
Platelets: 184 10*3/uL (ref 150–400)
RBC: 5.03 MIL/uL (ref 4.22–5.81)
RBC: 4.54 MIL/uL (ref 4.22–5.81)
RDW: 18 % — ABNORMAL HIGH (ref 11.5–15.5)
RDW: 17.5 % — ABNORMAL HIGH (ref 11.5–15.5)
WBC: 11 10*3/uL — ABNORMAL HIGH (ref 4.0–10.5)
WBC: 10.1 10*3/uL (ref 4.0–10.5)
nRBC: 1.1 % — ABNORMAL HIGH (ref 0.0–0.2)
nRBC: 0.5 % — ABNORMAL HIGH (ref 0.0–0.2)

## 2020-02-02 LAB — BRAIN NATRIURETIC PEPTIDE: B Natriuretic Peptide: 2899.9 pg/mL — ABNORMAL HIGH (ref 0.0–100.0)

## 2020-02-02 LAB — POCT I-STAT EG7
Acid-base deficit: 4 mmol/L — ABNORMAL HIGH (ref 0.0–2.0)
Bicarbonate: 19.5 mmol/L — ABNORMAL LOW (ref 20.0–28.0)
Calcium, Ion: 1.17 mmol/L (ref 1.15–1.40)
HCT: 45 % (ref 39.0–52.0)
Hemoglobin: 15.3 g/dL (ref 13.0–17.0)
O2 Saturation: 51 %
Potassium: 3.8 mmol/L (ref 3.5–5.1)
Sodium: 140 mmol/L (ref 135–145)
TCO2: 20 mmol/L — ABNORMAL LOW (ref 22–32)
pCO2, Ven: 29.9 mmHg — ABNORMAL LOW (ref 44.0–60.0)
pH, Ven: 7.423 (ref 7.250–7.430)
pO2, Ven: 26 mmHg — CL (ref 32.0–45.0)

## 2020-02-02 LAB — URINALYSIS, ROUTINE W REFLEX MICROSCOPIC
Bilirubin Urine: NEGATIVE
Glucose, UA: NEGATIVE mg/dL
Ketones, ur: NEGATIVE mg/dL
Leukocytes,Ua: NEGATIVE
Nitrite: NEGATIVE
Protein, ur: 100 mg/dL — AB
Specific Gravity, Urine: 1.013 (ref 1.005–1.030)
pH: 5 (ref 5.0–8.0)

## 2020-02-02 LAB — COMPREHENSIVE METABOLIC PANEL
ALT: 611 U/L — ABNORMAL HIGH (ref 0–44)
ALT: 597 U/L — ABNORMAL HIGH (ref 0–44)
AST: 311 U/L — ABNORMAL HIGH (ref 15–41)
AST: 409 U/L — ABNORMAL HIGH (ref 15–41)
Albumin: 4 g/dL (ref 3.5–5.0)
Albumin: 3.6 g/dL (ref 3.5–5.0)
Alkaline Phosphatase: 98 U/L (ref 38–126)
Alkaline Phosphatase: 89 U/L (ref 38–126)
Anion gap: 13 (ref 5–15)
Anion gap: 12 (ref 5–15)
BUN: 25 mg/dL — ABNORMAL HIGH (ref 8–23)
BUN: 31 mg/dL — ABNORMAL HIGH (ref 8–23)
CO2: 19 mmol/L — ABNORMAL LOW (ref 22–32)
CO2: 20 mmol/L — ABNORMAL LOW (ref 22–32)
Calcium: 9.7 mg/dL (ref 8.9–10.3)
Calcium: 9.2 mg/dL (ref 8.9–10.3)
Chloride: 106 mmol/L (ref 98–111)
Chloride: 109 mmol/L (ref 98–111)
Creatinine, Ser: 1.76 mg/dL — ABNORMAL HIGH (ref 0.61–1.24)
Creatinine, Ser: 1.59 mg/dL — ABNORMAL HIGH (ref 0.61–1.24)
GFR calc Af Amer: 47 mL/min — ABNORMAL LOW (ref >60)
GFR calc Af Amer: 54 mL/min — ABNORMAL LOW (ref >60)
GFR calc non Af Amer: 41 mL/min — ABNORMAL LOW (ref >60)
GFR calc non Af Amer: 46 mL/min — ABNORMAL LOW (ref >60)
Glucose, Bld: 157 mg/dL — ABNORMAL HIGH (ref 70–99)
Glucose, Bld: 113 mg/dL — ABNORMAL HIGH (ref 70–99)
Potassium: 4 mmol/L (ref 3.5–5.1)
Potassium: 3.9 mmol/L (ref 3.5–5.1)
Sodium: 138 mmol/L (ref 135–145)
Sodium: 141 mmol/L (ref 135–145)
Total Bilirubin: 3.8 mg/dL — ABNORMAL HIGH (ref 0.3–1.2)
Total Bilirubin: 4 mg/dL — ABNORMAL HIGH (ref 0.3–1.2)
Total Protein: 6.9 g/dL (ref 6.5–8.1)
Total Protein: 6.3 g/dL — ABNORMAL LOW (ref 6.5–8.1)

## 2020-02-02 LAB — MAGNESIUM: Magnesium: 1.9 mg/dL (ref 1.7–2.4)

## 2020-02-02 LAB — GLUCOSE, CAPILLARY: Glucose-Capillary: 105 mg/dL — ABNORMAL HIGH (ref 70–99)

## 2020-02-02 LAB — DIGOXIN LEVEL: Digoxin Level: <0.2 ng/mL — ABNORMAL LOW (ref 0.8–2.0)

## 2020-02-02 LAB — HEMOGLOBIN A1C
Hgb A1c MFr Bld: 6.2 % — ABNORMAL HIGH (ref 4.8–5.6)
Mean Plasma Glucose: 131.24 mg/dL

## 2020-02-02 LAB — ACETAMINOPHEN LEVEL: Acetaminophen (Tylenol), Serum: <10 ug/mL — ABNORMAL LOW (ref 10–30)

## 2020-02-02 LAB — POC SARS CORONAVIRUS 2 AG -  ED: SARS Coronavirus 2 Ag: NEGATIVE

## 2020-02-02 LAB — I-STAT CHEM 8, ED
BUN: 29 mg/dL — ABNORMAL HIGH (ref 8–23)
Calcium, Ion: 1.14 mmol/L — ABNORMAL LOW (ref 1.15–1.40)
Chloride: 107 mmol/L (ref 98–111)
Creatinine, Ser: 1.6 mg/dL — ABNORMAL HIGH (ref 0.61–1.24)
Glucose, Bld: 147 mg/dL — ABNORMAL HIGH (ref 70–99)
HCT: 46 % (ref 39.0–52.0)
Hemoglobin: 15.6 g/dL (ref 13.0–17.0)
Potassium: 3.8 mmol/L (ref 3.5–5.1)
Sodium: 140 mmol/L (ref 135–145)
TCO2: 20 mmol/L — ABNORMAL LOW (ref 22–32)

## 2020-02-02 LAB — TROPONIN I (HIGH SENSITIVITY)
Troponin I (High Sensitivity): 36 ng/L — ABNORMAL HIGH (ref <18)
Troponin I (High Sensitivity): 42 ng/L — ABNORMAL HIGH (ref <18)

## 2020-02-02 LAB — CK
Total CK: 159 U/L (ref 49–397)
Total CK: 151 U/L (ref 49–397)

## 2020-02-02 LAB — LACTIC ACID, PLASMA: Lactic Acid, Venous: 2.9 mmol/L (ref 0.5–1.9)

## 2020-02-02 LAB — PHOSPHORUS: Phosphorus: 3.5 mg/dL (ref 2.5–4.6)

## 2020-02-02 LAB — APTT: aPTT: 38 seconds — ABNORMAL HIGH (ref 24–36)

## 2020-02-02 LAB — SARS CORONAVIRUS 2 (TAT 6-24 HRS): SARS Coronavirus 2: NEGATIVE

## 2020-02-02 LAB — ETHANOL: Alcohol, Ethyl (B): <10 mg/dL (ref <10)

## 2020-02-02 LAB — SALICYLATE LEVEL: Salicylate Lvl: <7 mg/dL — ABNORMAL LOW (ref 7.0–30.0)

## 2020-02-02 LAB — BILIRUBIN, DIRECT: Bilirubin, Direct: 1.2 mg/dL — ABNORMAL HIGH (ref 0.0–0.2)

## 2020-02-02 LAB — HEPATITIS B SURFACE ANTIGEN: Hepatitis B Surface Ag: NONREACTIVE

## 2020-02-02 LAB — TSH: TSH: 26.296 u[IU]/mL — ABNORMAL HIGH (ref 0.350–4.500)

## 2020-02-02 LAB — HEPATITIS B CORE ANTIBODY, IGM: Hep B C IgM: NONREACTIVE

## 2020-02-02 MED ORDER — FUROSEMIDE 10 MG/ML IJ SOLN
80.0000 mg | Freq: Once | INTRAMUSCULAR | Status: AC
  Administered 2020-02-02: 80 mg via INTRAVENOUS
  Filled 2020-02-02: qty 8

## 2020-02-02 MED ORDER — FUROSEMIDE 10 MG/ML IJ SOLN
40.0000 mg | Freq: Once | INTRAMUSCULAR | Status: AC
  Administered 2020-02-02: 40 mg via INTRAVENOUS
  Filled 2020-02-02: qty 4

## 2020-02-02 MED ORDER — SPIRONOLACTONE 12.5 MG HALF TABLET: 12.5000 mg | ORAL_TABLET | Freq: Every day | ORAL | Status: DC

## 2020-02-02 MED ORDER — CLOPIDOGREL BISULFATE 75 MG PO TABS
75.0000 mg | ORAL_TABLET | Freq: Every day | ORAL | Status: DC
  Administered 2020-02-03 – 2020-02-04 (×2): 75 mg via ORAL
  Filled 2020-02-02 (×2): qty 1

## 2020-02-02 MED ORDER — LEVOTHYROXINE SODIUM 25 MCG PO TABS
125.0000 ug | ORAL_TABLET | Freq: Every day | ORAL | Status: DC
  Administered 2020-02-03 – 2020-02-14 (×12): 125 ug via ORAL
  Filled 2020-02-02 (×13): qty 1

## 2020-02-02 MED ORDER — LORAZEPAM 0.5 MG PO TABS
0.5000 mg | ORAL_TABLET | Freq: Two times a day (BID) | ORAL | Status: DC | PRN
  Filled 2020-02-02: qty 1

## 2020-02-02 MED ORDER — PANTOPRAZOLE SODIUM 40 MG PO TBEC
40.0000 mg | DELAYED_RELEASE_TABLET | Freq: Every day | ORAL | Status: DC
  Administered 2020-02-03 – 2020-02-14 (×12): 40 mg via ORAL
  Filled 2020-02-02 (×13): qty 1

## 2020-02-02 MED ORDER — HEPARIN (PORCINE) 25000 UT/250ML-% IV SOLN
1500.0000 [IU]/h | INTRAVENOUS | Status: DC
  Administered 2020-02-02: 1300 [IU]/h via INTRAVENOUS
  Administered 2020-02-03: 1500 [IU]/h via INTRAVENOUS
  Administered 2020-02-04 – 2020-02-05 (×4): 1900 [IU]/h via INTRAVENOUS
  Administered 2020-02-06: 16:00:00 2000 [IU]/h via INTRAVENOUS
  Administered 2020-02-07: 2300 [IU]/h via INTRAVENOUS
  Administered 2020-02-07: 2100 [IU]/h via INTRAVENOUS
  Administered 2020-02-08 – 2020-02-09 (×3): 2050 [IU]/h via INTRAVENOUS
  Filled 2020-02-02 (×13): qty 250

## 2020-02-02 MED ORDER — FUROSEMIDE 10 MG/ML IJ SOLN
4.0000 mg/h | INTRAVENOUS | Status: DC
  Administered 2020-02-02: 10 mg/h via INTRAVENOUS
  Filled 2020-02-02: qty 25

## 2020-02-02 MED ORDER — MILRINONE LACTATE IN DEXTROSE 20-5 MG/100ML-% IV SOLN
0.3750 ug/kg/min | INTRAVENOUS | Status: DC
  Administered 2020-02-02: 23:00:00 0.25 ug/kg/min via INTRAVENOUS
  Administered 2020-02-03: 0.375 ug/kg/min via INTRAVENOUS
  Administered 2020-02-03: 0.125 ug/kg/min via INTRAVENOUS
  Administered 2020-02-04: 0.25 ug/kg/min via INTRAVENOUS
  Administered 2020-02-04 (×2): 0.375 ug/kg/min via INTRAVENOUS
  Administered 2020-02-05 – 2020-02-07 (×6): 0.25 ug/kg/min via INTRAVENOUS
  Administered 2020-02-08: 0.375 ug/kg/min via INTRAVENOUS
  Administered 2020-02-08: 0.25 ug/kg/min via INTRAVENOUS
  Administered 2020-02-09 – 2020-02-14 (×13): 0.375 ug/kg/min via INTRAVENOUS
  Filled 2020-02-02 (×25): qty 100

## 2020-02-02 MED ORDER — INSULIN ASPART 100 UNIT/ML ~~LOC~~ SOLN
0.0000 [IU] | Freq: Three times a day (TID) | SUBCUTANEOUS | Status: DC
  Administered 2020-02-10 – 2020-02-11 (×2): 1 [IU] via SUBCUTANEOUS

## 2020-02-02 MED ORDER — ENOXAPARIN SODIUM 40 MG/0.4ML ~~LOC~~ SOLN: 40.0000 mg | SUBCUTANEOUS | Status: DC

## 2020-02-02 MED ORDER — CLONAZEPAM 0.5 MG PO TBDP: 0.5000 mg | ORAL_TABLET | Freq: Two times a day (BID) | ORAL | Status: DC | PRN

## 2020-02-02 NOTE — ED Triage Notes (Addendum)
Pt here from home, found by friends w/ SOB, pt hyperventilating. Per EMS code [facility] was called and cancelled. Pt in ST, RR >30 resp/min on 4L [place] for EMS, pt alert to self only. Ems gave 324 aspirin, 192ml NS bolus.

## 2020-02-02 NOTE — ED Notes (Signed)
Pt labs collect by Julaine Fusi NT w/ Nishka Heide RN.

## 2020-02-02 NOTE — ED Provider Notes (Signed)
Patient's care assumed after prior ED provider.    Case discussed with the admitting service.    Patient will be evaluated for admission.     Valarie Merino, MD 02/02/20 [phone]

## 2020-02-02 NOTE — Consult Note (Addendum)
Cardiology Consultation:   Patient ID: Tony Alvarez MRN: [id number]; DOB: [birth year]  Admit date: 02/02/2020 Date of Consult: 02/02/2020  Primary Care Provider: [facility] Primary Cardiologist: Shelva Majestic, MD  Primary Electrophysiologist:  None    Patient Profile:   Tony Alvarez is a 64 y.o. male with a history of NYHA IIb/ACC C ICM (LVEDD 6.7cm, LVEF 25%, ICD in situ, c/b LV thrombus on NOAC), CAD s/p anterior STEMI 01/2019, ?EtOH withdrawal and related delirium who presents with altered mental status found to be in acute decompensated heart failure with SCAI B-C cardiogenic shock.   History of Present Illness:   Tony Alvarez is altered, so most of the history is obtained from chart review.   Per notes, patient was last seen by Tony Alvarez in 12/2019 after recurrent hospitalizations for decompensated heart failure and increasing intolerance of GDMT due to hypotension. At that time he was tolerating carvedilol 3.125 BID and spironolactone 12.5 mg daily in addition to lasix 40mg  BID. BP during that visit was 98/72. Lasix was reduced to 40mg  daily and spironolactone and carvedilol uptitrated. Per telephone encounter from 10 days after his office visit, patient noted to be increasingly SOB for which his PO lasix was increased to 60mg  daily x 1-2 days.   Today, a neighbor went to check on Tony Alvarez and found him altered.   Upon arrival to the ER, patients BP was 102/53, HR 97, and per chart was tachypneic to low 50s though with SpO2 > 92% on RA. Initial labs notable for metabolic acidosis with HCO3 19 and lactate 2.9, AKI with Cr 1.7 from 1.1, AST/ALT 311/611 with tBili 3.8. BNP 2899 from 440 1 month ago. TSH elevated to 26.   CT head with no acute intracranial abnormality. CXR without significant pulmonary edema or infiltrate. Abdominal ultrasound without evidence of cirrhosis and renal ultrasound with medical renal disease. EKG with leftward axis and IVCD largely unchanged  from prior.   Weight during last office visit was 97.9 kg. No weight recorded yet during this hospitalization.   Past Medical History:  Diagnosis Date   Barrett's esophagus 04/21/2018   CAD (coronary artery disease)    a. s/p STEMI in 01/2019 with DES to proximal LAD   CHF (congestive heart failure) ([place])    a. EF 35-40% by echo in 01/2019, at 25-30% by repeat echo in 05/2019 and 08/2019   Depression    High cholesterol    Hypertension    Hypothyroid    Sleep apnea    wears CPAP at home    Past Surgical History:  Procedure Laterality Date   BIOPSY  05/24/2018   Procedure: BIOPSY;  Surgeon: Rogene Houston, MD;  [facility];  Service: Endoscopy;;  gastric   COLONOSCOPY     COLONOSCOPY N/A 05/24/2018   Procedure: COLONOSCOPY;  Surgeon: Rogene Houston, MD;  [facility];  Service: Endoscopy;  Laterality: N/A;  8:55   COLONOSCOPY WITH ESOPHAGOGASTRODUODENOSCOPY (EGD)     CORONARY STENT INTERVENTION N/A 01/26/2019   Procedure: CORONARY STENT INTERVENTION;  Surgeon: Troy Sine, MD;  Location: [place] [facility];  Service: Cardiovascular;  Laterality: N/A;   CORONARY/GRAFT ACUTE MI REVASCULARIZATION N/A 01/26/2019   Procedure: Coronary/Graft Acute MI Revascularization;  Surgeon: Troy Sine, MD;  Location: [place] [facility];  Service: Cardiovascular;  Laterality: N/A;   ESOPHAGOGASTRODUODENOSCOPY N/A 05/24/2018   Procedure: ESOPHAGOGASTRODUODENOSCOPY (EGD);  Surgeon: Rogene Houston, MD;  [facility];  Service: Endoscopy;  Laterality: N/A;   ICD IMPLANT N/A 12/12/2019   Procedure: ICD IMPLANT;  Surgeon: Deboraha Sprang, MD;  Location: [place] [facility];  Service: Cardiovascular;  Laterality: N/A;   LEFT HEART CATH AND CORONARY ANGIOGRAPHY N/A 01/26/2019   Procedure: LEFT HEART CATH AND CORONARY ANGIOGRAPHY;  Surgeon: Troy Sine, MD;  Location: [place] [facility];  Service: Cardiovascular;  Laterality: N/A;   MEDIAL PARTIAL  KNEE REPLACEMENT     2015   POLYPECTOMY  05/24/2018   Procedure: POLYPECTOMY;  Surgeon: Rogene Houston, MD;  [facility];  Service: Endoscopy;;  colon   rt hand injury     shoulder surgery for bursitis     TONSILLECTOMY       Home Medications:  Prior to Admission medications   Medication Sig Start Date End Date Taking? Authorizing Provider  acetaminophen (TYLENOL) 325 MG tablet Take 2 tablets (650 mg total) by mouth every 6 (six) hours as needed for mild pain, fever or headache (or Fever >/= 101). 09/13/19  Yes Emokpae, Courage, MD  albuterol (VENTOLIN HFA) 108 (90 Base) MCG/ACT inhaler Inhale 2 puffs into the lungs every 6 (six) hours as needed for wheezing or shortness of breath. 09/13/19  Yes Emokpae, Courage, MD  ARIPiprazole (ABILIFY) 15 MG tablet Take 15 mg by mouth at bedtime. 09/01/19  Yes [provider]  aspirin 81 MG chewable tablet Chew 1 tablet (81 mg total) by mouth daily. 10/22/19  Yes Enzo Bi, MD  atorvastatin (LIPITOR) 80 MG tablet Take 1 tablet (80 mg total) by mouth daily at 6 PM. 01/30/19  Yes Almyra Deforest, PA  buPROPion [facility] SR) 150 MG 12 hr tablet Take 150-300 mg by mouth See admin instructions. Take two tablets (300 mg) by mouth every morning and one tablet (150 mg) with lunch 03/27/18  Yes [provider]  carvedilol (COREG) 6.25 MG tablet Take 1 tablet (6.25 mg total) by mouth 2 (two) times daily. 12/21/19  Yes Troy Sine, MD  clonazePAM (KLONOPIN) 1 MG tablet Take 1-2 mg by mouth 2 (two) times daily. 01/13/20  Yes [provider]  clopidogrel (PLAVIX) 75 MG tablet Take 75 mg by mouth daily.   Yes [provider]  digoxin (LANOXIN) 0.125 MG tablet Take a whole pill for 4 days then 1/2 pill  after. Patient taking differently: Take 0.0625 mg by mouth daily.  11/22/19  Yes Troy Sine, MD  Eszopiclone 3 MG TABS Take 3 mg by mouth at bedtime. 09/01/19  Yes [provider]  folic acid (FOLVITE) 1 MG  tablet Take 1 tablet (1 mg total) by mouth daily. 09/14/19  Yes Emokpae, Courage, MD  furosemide (LASIX) 40 MG tablet Take 1 tablet (40 mg total) by mouth daily. 12/21/19 03/20/20 Yes Troy Sine, MD  levothyroxine (SYNTHROID) 125 MCG tablet Take 125 mcg by mouth daily before breakfast.    Yes [provider]  Multiple Vitamin (MULTIVITAMIN WITH MINERALS) TABS tablet Take 1 tablet by mouth every morning.   Yes [provider]  nitroGLYCERIN (NITROSTAT) 0.4 MG SL tablet Place 1 tablet (0.4 mg total) under the tongue every 5 (five) minutes x 3 doses as needed for chest pain. 01/30/19  Yes Almyra Deforest, PA  pantoprazole (PROTONIX) 40 MG tablet Take 1 tablet (40 mg total) by mouth daily. 09/13/19 09/12/20 Yes Emokpae, Courage, MD  potassium chloride SA (KLOR-CON) 20 MEQ tablet Take 2 tablets (40 mEq total) by mouth daily. 10/21/19 02/02/20  Yes Enzo Bi, MD  QUEtiapine (SEROQUEL) 100 MG tablet Take 50 mg by mouth at bedtime.  10/04/19  Yes [provider]  spironolactone (ALDACTONE) 25 MG tablet Take 0.5 tablets (12.5 mg total) by mouth 2 (two) times daily. 12/21/19  Yes Troy Sine, MD    Inpatient Medications: Scheduled Meds:  [START ON 02/03/2020] insulin aspart  0-6 Units Subcutaneous TID WC   [START ON 02/03/2020] levothyroxine  125 mcg Oral QAC breakfast   [START ON 02/03/2020] pantoprazole  40 mg Oral Daily   Continuous Infusions:  furosemide (LASIX) infusion     milrinone     PRN Meds: LORazepam  Allergies:   No Known Allergies  Social History:   Social History   Socioeconomic History   Marital status: Widowed    Spouse name: Not on file   Number of children: Not on file   Years of education: Not on file   Highest education level: Not on file  Occupational History   Not on file  Tobacco Use   Smoking status: Former Smoker    Packs/day: 0.25    Years: 40.00    Pack years: 10.00    Types: Cigarettes   Smokeless tobacco: Never Used    Substance and Sexual Activity   Alcohol use: Yes    Comment: very rare   Drug use: Never   Sexual activity: Not on file  Other Topics Concern   Not on file  Social History Narrative   Not on file   Social Determinants of Health   Financial Resource Strain:    Difficulty of Paying Living Expenses:   Food Insecurity:    Worried About Charity fundraiser in the Last Year:    Arboriculturist in the Last Year:   Transportation Needs:    Film/video editor (Medical):    Lack of Transportation (Non-Medical):   Physical Activity:    Days of Exercise per Week:    Minutes of Exercise per Session:   Stress:    Feeling of Stress :   Social Connections:    Frequency of Communication with Friends and Family:    Frequency of Social Gatherings with Friends and Family:    Attends Religious Services:    Active Member of Clubs or Organizations:    Attends Music therapist:    Marital Status:   Intimate Partner Violence:    Fear of Current or Ex-Partner:    Emotionally Abused:    Physically Abused:    Sexually Abused:     Family History:    Family History  Problem Relation Age of Onset   Colon cancer Neg Hx      Review of Systems: [y] = yes, [ ]  = no     Unable to obtain due to altered mental status.   Physical Exam/Data:   Vitals:   02/02/20 1915 02/02/20 1930 02/02/20 1945 02/02/20 2038  BP: 109/76 102/75 103/70 (!) 119/103  Pulse: 95 98 97 93  Resp: (!) 47 (!) 45 (!) 50 (!) 46  Temp:    98.9 F (37.2 C)  TempSrc:    Oral  SpO2: 96% 100% 98% 94%    Intake/Output Summary (Last 24 hours) at 02/02/2020 2105 Last data filed at 02/02/2020 1715 Gross per 24 hour  Intake --  Output 500 ml  Net -500 ml   There were no vitals filed for this visit. There is no height or weight on file to calculate BMI.  General:  Ill appearing, caucasian man. Lying in bed with head elevated > 45 degrees. Tachypneic.  HEENT: normal Neck: JVD noted  to earlobe when HOB ~ 30 degrees.  Vascular: 2+ radial pulses.   Cardiac:  normal S1, S2; tachycardic. No murmurs.  Lungs:  clear to auscultation anteriorly, no wheezing, rhonchi or rales  Abd: soft, nontender. Ext: 2+ edema bilaterally. Cool extremities. Slow cap refill.  Neuro:  Altered, lethargic.   EKG:  The EKG was personally reviewed and demonstrates:  Sinus rhythm with left axis, IVCD (QRS 133). Chronic QS waves anteriorly.   Relevant CV Studies: Echo 01/27/2019 1. The left ventricle has moderately reduced systolic function, with an ejection fraction of 35-40%. There is mildly increased left ventricular wall thickness. 2. Severe akinesis of the left ventricular, mid-apical anteroseptal wall, anterior wall, apical segment and inferoapical wall. 3. Small, fixed thrombus on the anteroapical wall of the left ventricle.   Cath 01/26/2019  Prox RCA lesion is 20% stenosed.  Prox Cx lesion is 20% stenosed.  Prox LAD-1 lesion is 100% stenosed.  Post intervention, there is a 0% residual stenosis.  Prox LAD-2 lesion is 20% stenosed.  Mid LAD lesion is 40% stenosed.  LV end diastolic pressure is mildly elevated.  There is moderate left ventricular systolic dysfunction.  A stent was successfully placed.  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  Almost a common ostium left main with immediate trifurcation into the LAD, small ramus intermediate vessel and moderate size circumflex vessel. The ramus vessel is normal. The circumflex vessel has 20% proximal narrowing and gave rise to 2 marginal branches. The RCA is a dominant vessel with moderate luminal irregularity with 20% narrowing proximally 40% mid stenosis and 30% narrowing proximal to the acute margin.  Probable moderate acute LV dysfunction with hypocontractility involving the  anterolateral wall and apex. LVEDP 22 mm.  Successful PCI to the totally occluded LAD with PTCA and ultimate insertion of a 3.0 x 22 mm Resolute Onyx stent postdilated to 3.26 mm with the 100% occlusion being reduced to 0% and TIMI 0 flow improving to TIMI-3 flow. Once reperfusion was established, LAD had 20% mid LAD smooth narrowing and 40% distal smooth stenosis. The LAD wrapped around the apex and supplied the distal third of the inferior wall.  RECOMMENDATION: DAPT for minimum of 1 year. Optimal blood pressure control with ideal blood pressure less than 120/80. High potency statin therapy with target LDL less than 70. Medical therapy for concomitant CAD. A 2D echo Doppler study will be ordered improved LV function assessment.    Intervention       ECHO 08/17/2019 1. Left ventricular ejection fraction, by visual estimation, is 25 to 30%. The left ventricle has severely decreased function. There is no left ventricular hypertrophy. 2. Left ventricular diastolic Doppler parameters are consistent with pseudonormalization pattern of LV diastolic filling. 3. There is akinesis of the mid- distal anterioseptal wall, apex and apical lateral regions. There is spontaneous contrast in apical LV . Definity contrast was used. There is no evidence of apical thrombi. 4. Global right ventricle has normal systolic function.The right ventricular size is normal. No increase in right ventricular wall thickness. 5. Left atrial size was mildly dilated. 6. Right atrial size was normal. 7. The mitral valve is normal in structure. Moderate mitral valve regurgitation. 8. The tricuspid valve is normal in structure. Tricuspid valve regurgitation is trivial.  9. The aortic valve is normal in structure. Aortic valve regurgitation was not visualized by color flow Doppler. 10. The pulmonic valve was grossly normal. Pulmonic valve regurgitation is not visualized by color flow Doppler. 11. Aortic  dilatation noted. 12. There is mild dilatation of the ascending aorta measuring 39 mm. 13. Normal pulmonary artery systolic pressure. 14. The atrial septum is grossly normal.  Laboratory Data:  Chemistry Recent Labs  Lab 02/02/20 1343 02/02/20 1354  NA 138 140   140  K 4.0 3.8   3.8  CL 106 107  CO2 19*  --   GLUCOSE 157* 147*  BUN 25* 29*  CREATININE 1.76* 1.60*  CALCIUM 9.7  --   GFRNONAA 41*  --   GFRAA 47*  --   ANIONGAP 13  --     Recent Labs  Lab 02/02/20 1343  PROT 6.9  ALBUMIN 4.0  AST 311*  ALT 611*  ALKPHOS 98  BILITOT 3.8*   Hematology Recent Labs  Lab 02/02/20 1343 02/02/20 1354  WBC 11.0*  --   RBC 5.03  --   HGB 14.3 15.3   15.6  HCT 45.3 45.0   46.0  MCV 90.1  --   MCH 28.4  --   MCHC 31.6  --   RDW 18.0*  --   PLT 207  --    Cardiac EnzymesNo results for input(s): TROPONINI in the last 168 hours. No results for input(s): TROPIPOC in the last 168 hours.  BNP Recent Labs  Lab 02/02/20 1343  BNP 2,899.9*    DDimer No results for input(s): DDIMER in the last 168 hours.  Radiology/Studies:  CT Head Wo Contrast  Result Date: 02/02/2020 CLINICAL DATA:  Recent fall EXAM: CT HEAD WITHOUT CONTRAST CT CERVICAL SPINE WITHOUT CONTRAST TECHNIQUE: Multidetector CT imaging of the head and cervical spine was performed following the standard protocol without intravenous contrast. Multiplanar CT image reconstructions of the cervical spine were also generated. COMPARISON:  01/29/2019 FINDINGS: CT HEAD FINDINGS Brain: No evidence of acute infarction, hemorrhage, hydrocephalus, extra-axial collection or mass lesion/mass effect. Vascular: No hyperdense vessel or unexpected calcification. Skull: Normal. Negative for fracture or focal lesion. Sinuses/Orbits: No acute finding. Other: None. CT CERVICAL SPINE FINDINGS Alignment: Within normal limits. Skull base and vertebrae: 7 cervical segments are well visualized. Vertebral body height is well maintained. No acute  fracture or acute facet abnormality is noted. Disc space narrowing is noted at C4-5 6 and C6-7. Multilevel osteophytic changes are noted. Soft tissues and spinal canal: Surrounding soft tissue structures are within normal limits. Pacemaker device is noted on the left. Upper chest: See is the lung apices demonstrate emphysematous change. Other: None IMPRESSION: CT of the head: No acute intracranial abnormality noted. CT of the cervical spine: Multilevel degenerative change without acute abnormality. Electronically Signed   By: Inez Catalina M.D.   On: 02/02/2020 16:23   CT Cervical Spine Wo Contrast  Result Date: 02/02/2020 CLINICAL DATA:  Recent fall EXAM: CT HEAD WITHOUT CONTRAST CT CERVICAL SPINE WITHOUT CONTRAST TECHNIQUE: Multidetector CT imaging of the head and cervical spine was performed following the standard protocol without intravenous contrast. Multiplanar CT image reconstructions of the cervical spine were also generated. COMPARISON:  01/29/2019 FINDINGS: CT HEAD FINDINGS Brain: No evidence of acute infarction, hemorrhage, hydrocephalus, extra-axial collection or mass lesion/mass effect. Vascular: No hyperdense vessel or unexpected calcification. Skull: Normal. Negative for fracture or focal lesion. Sinuses/Orbits: No acute finding. Other: None. CT CERVICAL SPINE FINDINGS Alignment: Within normal limits.  Skull base and vertebrae: 7 cervical segments are well visualized. Vertebral body height is well maintained. No acute fracture or acute facet abnormality is noted. Disc space narrowing is noted at C4-5 6 and C6-7. Multilevel osteophytic changes are noted. Soft tissues and spinal canal: Surrounding soft tissue structures are within normal limits. Pacemaker device is noted on the left. Upper chest: See is the lung apices demonstrate emphysematous change. Other: None IMPRESSION: CT of the head: No acute intracranial abnormality noted. CT of the cervical spine: Multilevel degenerative change without acute  abnormality. Electronically Signed   By: Inez Catalina M.D.   On: 02/02/2020 16:23   US RENAL  Result Date: 02/02/2020 CLINICAL DATA:  Acute renal insufficiency. EXAM: RENAL / URINARY TRACT ULTRASOUND COMPLETE COMPARISON:  None. FINDINGS: Right Kidney: Renal measurements: 9.7 cm x 4.2 cm x 4.0 cm = volume: 84.50 mL. Diffusely increased echogenicity of the renal parenchyma is seen. No mass or hydronephrosis visualized. Left Kidney: Renal measurements: 11.7 cm x 5.7 cm x 4.0 cm = volume: 104.60 mL. Diffusely increased echogenicity of the renal parenchyma is seen. A 2.3 cm x 2.4 cm x 2.0 cm anechoic structure is seen within the left kidney. No flow is seen within this region on color Doppler evaluation. No hydronephrosis visualized. Bladder: A Foley catheter is seen within the urinary bladder. Other: None. IMPRESSION: 1. Left renal cyst. 2. Increased renal echogenicity which may be secondary to medical renal disease. Electronically Signed   By: Virgina Norfolk M.D.   On: 02/02/2020 19:04   DG Chest Port 1 View  Result Date: 02/02/2020 CLINICAL DATA:  Shortness of breath. EXAM: PORTABLE CHEST 1 VIEW COMPARISON:  December 12, 2019. FINDINGS: Stable cardiomegaly with central pulmonary vascular congestion. No pneumothorax is noted. Single lead left-sided pacemaker is unchanged in position. Mild bibasilar atelectasis or edema is noted. Bony thorax is unremarkable. IMPRESSION: Stable cardiomegaly with central pulmonary vascular congestion. Mild bibasilar atelectasis or edema is noted. Electronically Signed   By: Marijo Conception M.D.   On: 02/02/2020 13:57   [place] EKG SITE RITE  Result Date: 02/02/2020 If Site Rite image not attached, placement could not be confirmed due to current cardiac rhythm.  US Abdomen Limited RUQ  Result Date: 02/02/2020 CLINICAL DATA:  Elevated LFTs. EXAM: ULTRASOUND ABDOMEN LIMITED RIGHT UPPER QUADRANT COMPARISON:  None. FINDINGS: Gallbladder: No gallstones or wall thickening  visualized. No sonographic Murphy sign noted by sonographer. Common bile duct: Diameter: 5 mm. Liver: No focal lesion identified. Within normal limits in parenchymal echogenicity. Portal vein is patent on color Doppler imaging with normal direction of blood flow towards the liver. Other: None. IMPRESSION: No acute hepatobiliary disease. Electronically Signed   By: Marin Olp M.D.   On: 02/02/2020 19:08    Assessment and Plan:   1. Acute Decompensated Systolic HF, SCAI B/C Cardiogenic Shock -- PICC line ordered; due to RN availability to be placed tomorrow AM.  -- Check Co-Ox when able.  -- Start milrinone 0.25 now. -- Lasix 80mg  IV x 1 and Lasix gtt @ 10 ordered. Goal net negative 2L+ over next 24 hours. Foley in situ for strict I/Os.  -- BID BMP, Mg while aggressively diuresing. K>4, Mg>2. -- Hold beta blocker due to low output state.  -- Hold spironolactone due to AKI.  -- Please send Utox -- Advanced heart failure team to follow; may need evaluation for advanced therapies in the setting of recurrent HF hospitalizations and GDMT intolerance due to hypotension.   2. CAD  s/p STEMI, PCI 01/2019 -- Would continue plavix for now given burden of CAD and low bleeding risk.  -- no ASA given concomitant anticoagulation.  -- Ok to d/c plavix if planned procedures as > 1 year since intervention.   3. History of LV Thrombus -- On Xarelto as outpatient. Would bridge with heparin given likely need for assessment of invasive hemodynamics this admission.   Patient seen and examined with Dr. Sung Amabile.   For questions or updates, please contact [place] Please consult [email or web address] for contact info under    Signed, Milus Banister, MD  02/02/2020 9:05 PM  Agree with above.   64 y/o male with CAD with anterior STEMI in 3/20, HTN, HL. Systolic HF with LV thrombus and reported ETOH abuse.   Echo from 10/20 reviewed LVED 10% with moderate RV dysfunction.   Admitted with altered mental  status. Head CT unremarkable. Labwork c/w multiorgan hypoperfusion with mildly  elevated creatinine, LFTs and lactate.  On exam he is lethargic and confused. dissheelved Neck: supple. JVP to jaw. Carotids 2+ bilat; no bruits. No lymphadenopathy or thryomegaly appreciated. Cor: PMI nondisplaced. Regular + s3 Lungs: clear Abdomen: soft, nontender, nondistended. No hepatosplenomegaly. No bruits or masses. Good bowel sounds. Extremities: no cyanosis, clubbing, rash, 1+ edema cool Neuro:lethargic. will arouse but is confused  He has clear low output HF with volume overload and probable SCAI shock class C. Will start empiric milrinone and IV lasix. Have ordered PICC line for first thing in am to track CVP and co-ox. If not improving with milrinone may need to move to ICU.   Based on echo alone he would probably be a reasonable VAD candidate but I am not dure his social situation will permit this. We will continue to evaluate.   CRITICAL CARE Performed by: Glori Bickers  Total critical care time: 35 minutes  Critical care time was exclusive of separately billable procedures and treating other patients.  Critical care was necessary to treat or prevent imminent or life-threatening deterioration.  Critical care was time spent personally by me (independent of midlevel providers or residents) on the following activities: development of treatment plan with patient and/or surrogate as well as nursing, discussions with consultants, evaluation of patient's response to treatment, examination of patient, obtaining history from patient or surrogate, ordering and performing treatments and interventions, ordering and review of laboratory studies, ordering and review of radiographic studies, pulse oximetry and re-evaluation of patient's condition.  Glori Bickers, MD  1:26 AM  ADDENDUM 3:50AM Overnight with robust UOP to IV lasix bolus and drip - already negative 2.5 L. Increasing hypotension (SBPs high  70s, low 80s) with diuresis, thus decreased lasix gtt to 4mg /hr and lowered milrinone to 0.125 with improvement to SBPs > 90 (goal for renal perfusion). Potassium aggressively supplemented and suspect will be able to reintroduce MRA as Cr already improving with decongestion. Per RN mental status also much improved with aforementioned interventions.   Rosamaria Donn K. Marletta Lor, MD

## 2020-02-02 NOTE — H&P (Addendum)
Date: 02/02/2020               Patient Name:  Tony Alvarez MRN: [id number]  DOB: 31-Jan-1956 Age / Sex: 64 y.o., male   PCP: [facility]         Medical Service: Internal Medicine Teaching Service         Attending Physician: Dr. Heber Nogal, Rachel Moulds, DO    First Contact: Dr. Ladona Horns Pager: [phone]  Second Contact: Dr. Molli Hazard Pager: [phone]       After Hours (After 5p/  First Contact Pager: [phone]  weekends / holidays): Second Contact Pager: [phone]   Chief Complaint: altered mental status  History of Present Illness:  [name] is a 64 year old M with significant PMH of ischemic cardiomyopathy s/p STEMI and PCI in 01/2019, HFrEF (last EF 25-30% in 08/2019), HTN, HLD, OSA, and depression who presented with altered mental status. Pt seen at the bedside and intermittently following commands and answering questions. Says he is in "hospital," but otherwise appears to only respond by nodding head to indicate yes/no. Rest of history obtained per chart review. Pt found this afternoon by a neighbor in his home. Was hyperventilating and confused. ED provider spoke with sister, Tony Alvarez, who lives in [place] and had not spoken to the patient in 6 days. EMS initially called a STEMI, but cancelled after no change from prior EKG noted. Pt was tachycardic, RR >30 on 4L [place]. Given 324 ASA and 131m NS bolus by EMS.  In the ED, pt afebrile, HR 103, BP 106/78, RR 34, and O2 saturation 97% on 2L . BNP significantly elevated to 2,900. Trop 36 > 42. AST and ALT elevated to 311 and 611, with high T bili at 3.8 and normal alk phos 98. Other lab work remarkable for Cr 1.76 (prior 1.07), bicarb 19, K 4.0, Na 138, glc 157, WBC 11.0 with increased neutrophil count, Hgb 14.3, and plts 207. CT head and c-spine negative for acute abnormality. CXR with stable cardiomegaly, central pulmonary congestion, and mild bibasilar atelectasis vs edema. Pt was given 442mIV lasix in the  ED.  Meds: Current Meds  Medication Sig  . acetaminophen (TYLENOL) 325 MG tablet Take 2 tablets (650 mg total) by mouth every 6 (six) hours as needed for mild pain, fever or headache (or Fever >/= 101).  . Marland Kitchenlbuterol (VENTOLIN HFA) 108 (90 Base) MCG/ACT inhaler Inhale 2 puffs into the lungs every 6 (six) hours as needed for wheezing or shortness of breath.  . ARIPiprazole (ABILIFY) 15 MG tablet Take 15 mg by mouth at bedtime.  . Marland Kitchenspirin 81 MG chewable tablet Chew 1 tablet (81 mg total) by mouth daily.  . Marland Kitchentorvastatin (LIPITOR) 80 MG tablet Take 1 tablet (80 mg total) by mouth daily at 6 PM.  . buPROPion (WELLBUTRIN SR) 150 MG 12 hr tablet Take 150-300 mg by mouth See admin instructions. Take two tablets (300 mg) by mouth every morning and one tablet (150 mg) with lunch  . carvedilol (COREG) 6.25 MG tablet Take 1 tablet (6.25 mg total) by mouth 2 (two) times daily.  . clonazePAM (KLONOPIN) 1 MG tablet Take 1-2 mg by mouth 2 (two) times daily.  . clopidogrel (PLAVIX) 75 MG tablet Take 75 mg by mouth daily.  . digoxin (LANOXIN) 0.125 MG tablet Take a whole pill for 4 days then 1/2 pill  after. (Patient taking differently: Take 0.0625 mg by mouth daily. )  . Eszopiclone 3  MG TABS Take 3 mg by mouth at bedtime.  . folic acid (FOLVITE) 1 MG tablet Take 1 tablet (1 mg total) by mouth daily.  . furosemide (LASIX) 40 MG tablet Take 1 tablet (40 mg total) by mouth daily.  Marland Kitchen levothyroxine (SYNTHROID) 125 MCG tablet Take 125 mcg by mouth daily before breakfast.   . Multiple Vitamin (MULTIVITAMIN WITH MINERALS) TABS tablet Take 1 tablet by mouth every morning.  . nitroGLYCERIN (NITROSTAT) 0.4 MG SL tablet Place 1 tablet (0.4 mg total) under the tongue every 5 (five) minutes x 3 doses as needed for chest pain.  . pantoprazole (PROTONIX) 40 MG tablet Take 1 tablet (40 mg total) by mouth daily.  . potassium chloride SA (KLOR-CON) 20 MEQ tablet Take 2 tablets (40 mEq total) by mouth daily.  . QUEtiapine  (SEROQUEL) 100 MG tablet Take 50 mg by mouth at bedtime.   Marland Kitchen spironolactone (ALDACTONE) 25 MG tablet Take 0.5 tablets (12.5 mg total) by mouth 2 (two) times daily.   Allergies: Allergies as of 02/02/2020  . (No Known Allergies)   Past Medical History:  Diagnosis Date  . Barrett's esophagus 04/21/2018  . CAD (coronary artery disease)    a. s/p STEMI in 01/2019 with DES to proximal LAD  . CHF (congestive heart failure) ([place])    a. EF 35-40% by echo in 01/2019, at 25-30% by repeat echo in 05/2019 and 08/2019  . Depression   . High cholesterol   . Hypertension   . Hypothyroid   . Sleep apnea    wears CPAP at home   Family History:  Family History  Problem Relation Age of Onset  . Colon cancer Neg Hx    Additional family history unable to be provided.  Social History:  Social History   Tobacco Use  . Smoking status: Former Smoker    Packs/day: 0.25    Years: 40.00    Pack years: 10.00    Types: Cigarettes  . Smokeless tobacco: Never Used  Substance Use Topics  . Alcohol use: Yes    Comment: very rare  . Drug use: Never   When asked if pt's drinks EtOH, he shakes his head no. Denies drinking alcohol in the last day, week, or month.   Review of Systems: A complete ROS was negative except as per HPI.  Physical Exam: Blood pressure 109/76, pulse 95, temperature 98.2 F (36.8 C), temperature source Oral, resp. rate (!) 47, SpO2 96 %. Physical Exam Vitals and nursing note reviewed.  Constitutional:      General: He is in acute distress (mild).     Appearance: He is not ill-appearing.     Comments: Pt awake and alert. Following some commands and answering some questions, though largely non-verbal.  Eyes:     General: Scleral icterus (mild) present.  Neck:     Comments: + JVD to jawline Cardiovascular:     Rate and Rhythm: Normal rate and regular rhythm.     Heart sounds: Normal heart sounds.  Pulmonary:     Effort: Pulmonary effort is normal. No respiratory  distress.     Breath sounds: Normal breath sounds.  Abdominal:     General: Abdomen is flat. Bowel sounds are normal.     Palpations: Abdomen is soft.     Tenderness: There is no abdominal tenderness.     Comments: No asterixis   Musculoskeletal:     Comments: +2 bilateral lower extremity edema to the knees. Normal ROM throughout and without deformities.  Skin:    General: Skin is dry.     Comments: Cool distal extremities  Neurological:     Mental Status: He is alert. He is confused.     Comments: Oriented to self and place. Motor function intact. Sensation unable to be assessed secondary to inability to cooperate.   Psychiatric:        Mood and Affect: Affect is blunt.        Speech: He is noncommunicative.    EKG: personally reviewed my interpretation is sinus tachycardia, left atrial enlargement, and LVH, no changes from priors on 2/12 or 1/22.  CXR: personally reviewed my interpretation is stable cardiomegaly, ICD in place over L chest, without focal consolidation, increased pulmonary vascular congestion  Assessment & Plan by Problem: Active Problems:   Acute exacerbation of CHF (congestive heart failure) [facility])  Mr. Kenyon is a 64 year old M with significant PMH of ischemic cardiomyopathy s/p STEMI and PCI in 01/2019, HFrEF (last EF 25-30% in 08/2019), HTN, HLD, OSA, and depression who presented with altered mental status.   Altered mental status Pt found at home confused this afternoon. Now intermittently following commands and answering questions but largely non-verbal. Pt on multiple centrally acting and psych meds at home including aripiprazole, bupropion, clonazepam, seroquel, and eszopiclone. Alcohol use disorder and ?withdrawal delirium documented in the chart, though pt denies alcohol use on admission. CT head and neck in the ED without acute abnormality. CK normal x 2. No obvious electrolyte derangements.  - unclear if catatonia vs polypharmacy vs poor perfusion from  low output state vs hepatic encephalopathy - ativan 0.34m BID PRN to avoid withdrawal - EtOH, salicylate, and acetaminophen level pending - sitter  Potential cardiogenic shock Acute HFrEF exacerbation (EF 25-30% in 08/2019) Ischemic cardiomyopathy s/p STEMI and PCI in 01/2019 Presented with pitting LE edema to the knees and cool extremities. BNP significantly elevated to 2,899. Troponin 36 > 42. Follows with Dr. KClaiborne Billingsand was last seen on 2/10. S/p ICD implantation on 2/1. Home regimen of carvedilol 6.269mBID, spironolactone 12.31m88mID, lasix 69m31mily, and digoxin 0.06231mg32mly. Previously on Xarelto for mural thrombus, though uncertain compliance with this medication and not on current medication list. - suspect early cardiogenic shock given volume overload, cool extremities, and AKI/transaminitis - BP holding at this time 100s/70s - repeat echo - follow-up dig level - Foley in place - strict I/O's and daily weights  Cardiology consulted, appreciate input and recommendations - begin lasix drip - place PICC line for CVP and co-ox monitoring - suspect need for milrinone overnight - pharmacy consulted for heparin bridge for mural thrombus history and in anticipation of procedure  AKI Cr 1.76 on admission, priors 1.0-1.3. UA with small Hgb, 100 protein, 6-10 RBCs, and 0-5 WBCs. CK normal x2. Renal ultrasound significant for increase echogenicity possibility secondary to medical renal disease. - suspect secondary to vascular congestion vs low output state - will monitor with diuresis - holding home spironolactone   Elevated AST/ALT AST and ALT elevated to 311 and 611, with high T bili at 3.8 and normal alk phos 98. INR elevated to 1.8, though ?pt still on Xarelto. RUQ ultrasound with no acute hepatobiliary disease.  - suspect due to poor perfusion from low output HF - ordered direct bilirubin - Hepatitis labs pending - repeat CMP this evening  Dispo: Admit patient to Inpatient with  expected length of stay greater than 2 midnights.  Signed: JonesLadona Horns3/25/2021, 7:34 PM  Pager: [phone]

## 2020-02-02 NOTE — ED Notes (Signed)
Pt transported to CT ?

## 2020-02-02 NOTE — Progress Notes (Addendum)
ANTICOAGULATION CONSULT NOTE - Initial Consult  Pharmacy Consult for Heparin (without bolus) Indication: History of Mural Thrombus  No Known Allergies  Patient Measurements: Height: 5\' 10"  (177.8 cm) Weight: 206 lb (93.4 kg) IBW/kg (Calculated) : 73 Heparin Dosing Weight:  91.9 kg  Vital Signs: Temp: 98.9 F (37.2 C) (03/25 2038) Temp Source: Oral (03/25 2038) BP: 119/103 (03/25 2038) Pulse Rate: 93 (03/25 2038)  Labs: Recent Labs    02/02/20 1343 02/02/20 1354 02/02/20 1538 02/02/20 1839 02/02/20 1909  HGB 14.3 15.3  15.6  --   --   --   HCT 45.3 45.0  46.0  --   --   --   PLT 207  --   --   --   --   APTT  --   --   --  38*  --   LABPROT  --   --   --  21.0*  --   INR  --   --   --  1.8*  --   CREATININE 1.76* 1.60*  --   --   --   CKTOTAL 159  --   --   --  151  TROPONINIHS 36*  --  42*  --   --     Estimated Creatinine Clearance: 55.7 mL/min (A) (by C-G formula based on SCr of 1.6 mg/dL (H)).   Medical History: Past Medical History:  Diagnosis Date  . Barrett's esophagus 04/21/2018  . CAD (coronary artery disease)    a. s/p STEMI in 01/2019 with DES to proximal LAD  . CHF (congestive heart failure) ([place])    a. EF 35-40% by echo in 01/2019, at 25-30% by repeat echo in 05/2019 and 08/2019  . Depression   . High cholesterol   . Hypertension   . Hypothyroid   . Sleep apnea    wears CPAP at home   Assessment: 64 yr old male with hx of ischemic cardiomyopathy S/P STEMI and PCI in 01/2019, HFrEF (last EF 25-30% in 08/2019), HTN, HLD, OSA, and depression presented with AMS and potential cardiogenic shock. Pt was previously on Xarelto for mural thrombus, with unknown compliance (not on current med list, but pt told RN he was taking Xarelto as late as yesterday; however, pt is confused). Pharmacy is consulted to dose heparin for history or mural thrombus.   H/H, platelets WNL; admission INR 1.8. Given the uncertainty re: whether pt was taking Xarelto PTA, will  order aPTT with first heparin level.  Goal of Therapy:  Heparin level 0.3-0.7 units/ml Monitor platelets by anticoagulation protocol: Yes   Plan:  Start heparin infusion at 1300 units/hr (no bolus) Check 6-hr heparin level, aPTT Monitor daily heparin level, aPTT, CBC Monitor for signs/symptoms of bleeding  Gillermina Hu, PharmD, BCPS, [facility] Clinical Pharmacist 02/02/2020,9:12 PM

## 2020-02-02 NOTE — ED Notes (Signed)
RN attempted report 

## 2020-02-02 NOTE — ED Notes (Signed)
Culture sent in addition to UA 

## 2020-02-02 NOTE — ED Provider Notes (Signed)
[facility] EMERGENCY DEPARTMENT Provider Note   CSN: [id number] Arrival date & time: 02/02/20  1331     History Chief Complaint  Patient presents with  . Chest Pain  . Shortness of Breath  . Altered Mental Status    Tony Alvarez is a 64 y.o. male history of CHF with EF of 35%, CAD, high cholesterol, hypertension, here presenting with altered mental status.  Lives at home by himself.  He has a neighbor that comes in and check in on him.  Apparently the neighbor came in and found him altered today.  Patient cannot tell me what is going on.  He was recently admitted for a STEMI and had a stent placed.  Patient also has a CHF with a EF of 35%. EMS noted possible STEMI but was found to be unchanged so no STEMI was activated.   I talked to sister, Rudene Anda [phone]). She lives in [place] and last talked to him 6 days ago. He didn't answer his phone. His neighbor checked in on him yesterday and he appears confused. His friend, Truman Hayward, came to visit him today and he was noted to be very altered. Patient unable to give any history.   The history is provided by the EMS personnel.   Level V caveat- AMS     Past Medical History:  Diagnosis Date  . Barrett's esophagus 04/21/2018  . CAD (coronary artery disease)    a. s/p STEMI in 01/2019 with DES to proximal LAD  . CHF (congestive heart failure) ([place])    a. EF 35-40% by echo in 01/2019, at 25-30% by repeat echo in 05/2019 and 08/2019  . Depression   . High cholesterol   . Hypertension   . Hypothyroid   . Sleep apnea    wears CPAP at home    Patient Active Problem List   Diagnosis Date Noted  . CHF (congestive heart failure) ([place]) 11/13/2019  . Acute respiratory failure with hypoxia ([place]) 11/12/2019  . CKD (chronic kidney disease) stage 3, GFR 30-59 ml/min 11/12/2019  . Acute exacerbation of CHF (congestive heart failure) ([place]) 10/22/2019  . CHF exacerbation ([place]) 10/17/2019  . Acute and chronic respiratory  failure (acute-on-chronic) ([place]) 09/11/2019  . Sleep apnea   . Hypertension   . AKI (acute kidney injury) ([place])   . CAD (coronary artery disease) 06/24/2019  . Hypothyroid   . COPD (chronic obstructive pulmonary disease) ([place])   . Depression   . Hyperlipidemia 01/30/2019  . LV (left ventricular) mural thrombus with acute MI ([place]) 01/30/2019  . Alcohol abuse 01/30/2019  . Alcohol withdrawal delirium ([place]) 01/30/2019  . Elevated liver enzymes 01/30/2019  . Ischemic cardiomyopathy 01/30/2019  . Acute on chronic systolic CHF (congestive heart failure) ([place]) 01/30/2019  . History of colonic polyps 04/21/2018  . Barrett's esophagus 04/21/2018  . Barrett's esophagus without dysplasia 04/21/2018  . Rectal bleeding 04/21/2018    Past Surgical History:  Procedure Laterality Date  . BIOPSY  05/24/2018   Procedure: BIOPSY;  Surgeon: Rogene Houston, MD;  [facility];  Service: Endoscopy;;  gastric  . COLONOSCOPY    . COLONOSCOPY N/A 05/24/2018   Procedure: COLONOSCOPY;  Surgeon: Rogene Houston, MD;  Location[facility];  Service: Endoscopy;  Laterality: N/A;  8:55  . COLONOSCOPY WITH ESOPHAGOGASTRODUODENOSCOPY (EGD)    . CORONARY STENT INTERVENTION N/A 01/26/2019   Procedure: CORONARY STENT INTERVENTION;  Surgeon: Troy Sine, MD;  Location: [facility] CV LAB;  Service:  Cardiovascular;  Laterality: N/A;  . CORONARY/GRAFT ACUTE MI REVASCULARIZATION N/A 01/26/2019   Procedure: Coronary/Graft Acute MI Revascularization;  Surgeon: Troy Sine, MD;  Location: [facility] LAB;  Service: Cardiovascular;  Laterality: N/A;  . ESOPHAGOGASTRODUODENOSCOPY N/A 05/24/2018   Procedure: ESOPHAGOGASTRODUODENOSCOPY (EGD);  Surgeon: Rogene Houston, MD;  [facility];  Service: Endoscopy;  Laterality: N/A;  . ICD IMPLANT N/A 12/12/2019   Procedure: ICD IMPLANT;  Surgeon: Deboraha Sprang, MD;  Location: [facility] LAB;  Service: Cardiovascular;  Laterality: N/A;  . LEFT  HEART CATH AND CORONARY ANGIOGRAPHY N/A 01/26/2019   Procedure: LEFT HEART CATH AND CORONARY ANGIOGRAPHY;  Surgeon: Troy Sine, MD;  Location: [facility] LAB;  Service: Cardiovascular;  Laterality: N/A;  . MEDIAL PARTIAL KNEE REPLACEMENT     2015  . POLYPECTOMY  05/24/2018   Procedure: POLYPECTOMY;  Surgeon: Rogene Houston, MD;  [facility];  Service: Endoscopy;;  colon  . rt hand injury    . shoulder surgery for bursitis    . TONSILLECTOMY         Family History  Problem Relation Age of Onset  . Colon cancer Neg Hx     Social History   Tobacco Use  . Smoking status: Former Smoker    Packs/day: 0.25    Years: 40.00    Pack years: 10.00    Types: Cigarettes  . Smokeless tobacco: Never Used  Substance Use Topics  . Alcohol use: Yes    Comment: very rare  . Drug use: Never    Home Medications Prior to Admission medications   Medication Sig Start Date End Date Taking? Authorizing Provider  acetaminophen (TYLENOL) 325 MG tablet Take 2 tablets (650 mg total) by mouth every 6 (six) hours as needed for mild pain, fever or headache (or Fever >/= 101). 09/13/19  Yes Emokpae, Courage, MD  albuterol (VENTOLIN HFA) 108 (90 Base) MCG/ACT inhaler Inhale 2 puffs into the lungs every 6 (six) hours as needed for wheezing or shortness of breath. 09/13/19  Yes Emokpae, Courage, MD  ARIPiprazole (ABILIFY) 15 MG tablet Take 15 mg by mouth at bedtime. 09/01/19  Yes [provider]  aspirin 81 MG chewable tablet Chew 1 tablet (81 mg total) by mouth daily. 10/22/19  Yes Enzo Bi, MD  atorvastatin (LIPITOR) 80 MG tablet Take 1 tablet (80 mg total) by mouth daily at 6 PM. 01/30/19  Yes Almyra Deforest, PA  buPROPion [facility] SR) 150 MG 12 hr tablet Take 150-300 mg by mouth See admin instructions. Take two tablets (300 mg) by mouth every morning and one tablet (150 mg) with lunch 03/27/18  Yes [provider]  carvedilol (COREG) 6.25 MG tablet Take 1 tablet (6.25 mg  total) by mouth 2 (two) times daily. 12/21/19  Yes Troy Sine, MD  clonazePAM (KLONOPIN) 1 MG tablet Take 1-2 mg by mouth 2 (two) times daily. 01/13/20  Yes [provider]  clopidogrel (PLAVIX) 75 MG tablet Take 75 mg by mouth daily.   Yes [provider]  digoxin (LANOXIN) 0.125 MG tablet Take a whole pill for 4 days then 1/2 pill  after. Patient taking differently: Take 0.0625 mg by mouth daily.  11/22/19  Yes Troy Sine, MD  Eszopiclone 3 MG TABS Take 3 mg by mouth at bedtime. 09/01/19  Yes [provider]  folic acid (FOLVITE) 1 MG tablet Take 1 tablet (1 mg total) by mouth daily. 09/14/19  Yes Emokpae,  Courage, MD  furosemide (LASIX) 40 MG tablet Take 1 tablet (40 mg total) by mouth daily. 12/21/19 03/20/20 Yes Troy Sine, MD  levothyroxine (SYNTHROID) 125 MCG tablet Take 125 mcg by mouth daily before breakfast.    Yes [provider]  Multiple Vitamin (MULTIVITAMIN WITH MINERALS) TABS tablet Take 1 tablet by mouth every morning.   Yes [provider]  nitroGLYCERIN (NITROSTAT) 0.4 MG SL tablet Place 1 tablet (0.4 mg total) under the tongue every 5 (five) minutes x 3 doses as needed for chest pain. 01/30/19  Yes Almyra Deforest, PA  pantoprazole (PROTONIX) 40 MG tablet Take 1 tablet (40 mg total) by mouth daily. 09/13/19 09/12/20 Yes Emokpae, Courage, MD  potassium chloride SA (KLOR-CON) 20 MEQ tablet Take 2 tablets (40 mEq total) by mouth daily. 10/21/19 02/02/20 Yes Enzo Bi, MD  QUEtiapine (SEROQUEL) 100 MG tablet Take 50 mg by mouth at bedtime.  10/04/19  Yes [provider]  spironolactone (ALDACTONE) 25 MG tablet Take 0.5 tablets (12.5 mg total) by mouth 2 (two) times daily. 12/21/19  Yes Troy Sine, MD    Allergies    Patient has no known allergies.  Review of Systems   Review of Systems  Neurological: Positive for weakness.  Psychiatric/Behavioral: Positive for confusion.  All other systems reviewed and are  negative.   Physical Exam Updated Vital Signs BP 110/81   Pulse 94   Temp 98.2 F (36.8 C) (Oral)   Resp (!) 48   SpO2 100%   Physical Exam Vitals and nursing note reviewed.  Constitutional:      Comments: Confused, disoriented   HENT:     Head: Normocephalic.  Eyes:     Pupils: Pupils are equal, round, and reactive to light.  Cardiovascular:     Rate and Rhythm: Normal rate and regular rhythm.     Heart sounds: Normal heart sounds.  Pulmonary:     Breath sounds: Normal breath sounds.  Abdominal:     General: Bowel sounds are normal.     Palpations: Abdomen is soft.  Musculoskeletal:        General: Normal range of motion.     Cervical back: Normal range of motion.     Comments: No obvious extremity trauma, 2+ edema bilateral legs.   Skin:    General: Skin is warm.     Capillary Refill: Capillary refill takes less than 2 seconds.  Neurological:     Comments: Confused, A & O x 1. Moving all extremities   Psychiatric:     Comments: Unable      ED Results / Procedures / Treatments   Labs (all labs ordered are listed, but only abnormal results are displayed) Labs Reviewed  CBC WITH DIFFERENTIAL/PLATELET - Abnormal; Notable for the following components:      Result Value   WBC 11.0 (*)    RDW 18.0 (*)    nRBC 1.1 (*)    Neutro Abs 8.8 (*)    Abs Immature Granulocytes 0.08 (*)    All other components within normal limits  COMPREHENSIVE METABOLIC PANEL - Abnormal; Notable for the following components:   CO2 19 (*)    Glucose, Bld 157 (*)    BUN 25 (*)    Creatinine, Ser 1.76 (*)    AST 311 (*)    ALT 611 (*)    Total Bilirubin 3.8 (*)    GFR calc non Af Amer 41 (*)    GFR calc Af Amer 47 (*)  All other components within normal limits  BRAIN NATRIURETIC PEPTIDE - Abnormal; Notable for the following components:   B Natriuretic Peptide 2,899.9 (*)    All other components within normal limits  I-STAT CHEM 8, ED - Abnormal; Notable for the following  components:   BUN 29 (*)    Creatinine, Ser 1.60 (*)    Glucose, Bld 147 (*)    Calcium, Ion 1.14 (*)    TCO2 20 (*)    All other components within normal limits  POCT I-STAT EG7 - Abnormal; Notable for the following components:   pCO2, Ven 29.9 (*)    pO2, Ven 26.0 (*)    Bicarbonate 19.5 (*)    TCO2 20 (*)    Acid-base deficit 4.0 (*)    All other components within normal limits  TROPONIN I (HIGH SENSITIVITY) - Abnormal; Notable for the following components:   Troponin I (High Sensitivity) 36 (*)    All other components within normal limits  SARS CORONAVIRUS 2 (TAT 6-24 HRS)  CK  BLOOD GAS, VENOUS  URINALYSIS, ROUTINE W REFLEX MICROSCOPIC  POC SARS CORONAVIRUS 2 AG -  ED  TROPONIN I (HIGH SENSITIVITY)    EKG EKG Interpretation  Date/Time:  Thursday February 02 2020 13:39:20 EDT Ventricular Rate:  101 PR Interval:    QRS Duration: 133 QT Interval:  374 QTC Calculation: 485 R Axis:   -77 Text Interpretation: Sinus tachycardia Left atrial enlargement Nonspecific IVCD with LAD Left ventricular hypertrophy Probable inferior infarct, acute Probable anterolateral infarct, acute Baseline wander in lead(s) V5 similar to previous no STEMI Confirmed by Wandra Arthurs [phone]) on 02/02/2020 1:58:13 PM   Radiology DG Chest Port 1 View  Result Date: 02/02/2020 CLINICAL DATA:  Shortness of breath. EXAM: PORTABLE CHEST 1 VIEW COMPARISON:  December 12, 2019. FINDINGS: Stable cardiomegaly with central pulmonary vascular congestion. No pneumothorax is noted. Single lead left-sided pacemaker is unchanged in position. Mild bibasilar atelectasis or edema is noted. Bony thorax is unremarkable. IMPRESSION: Stable cardiomegaly with central pulmonary vascular congestion. Mild bibasilar atelectasis or edema is noted. Electronically Signed   By: Marijo Conception M.D.   On: 02/02/2020 13:57    Procedures Procedures (including critical care time)  Medications Ordered in ED Medications  furosemide (LASIX)  injection 40 mg (40 mg Intravenous Given 02/02/20 1426)    ED Course  I have reviewed the triage vital signs and the nursing notes.  Pertinent labs & imaging results that were available during my care of the patient were reviewed by me and considered in my medical decision making (see chart for details).    MDM Rules/Calculators/A&P                      Kreg Pitsenbarger is a 64 y.o. male here with confused. Patient altered and confused.  Appears to be in heart failure.  Family has been trying to call him but unable to reach him.  Per family, this is not his baseline .  No reported falls.  I am unclear if he is able to take care of himself at home.  He also appears to be in heart failure as well.  Will get labs and BNP and troponin.  His EKG does not appear to have a STEMI.  3:43 PM BNP is 2800.  Patient has acute renal failure as well.  I do not think he is taking his medicine.  I do not think he is also safe to go home to  live by himself.  Ordered 40 mg of Lasix.  I talked to cardiology to consult as he is a patient [name].  They state that once medicine admits him, they can call him for official consult.  Patient unable to urinate after Lasix, his bladder scan showed about 270 cc.  Will put in Foley catheter.  4:06 PM Signed out to Dr. Francia Greaves to follow up CT head/neck and then admit patient.   Final Clinical Impression(s) / ED Diagnoses Final diagnoses:  None    Rx / DC Orders ED Discharge Orders    None       Drenda Freeze, MD 02/02/20 1606

## 2020-02-02 NOTE — ED Notes (Signed)
Pt transported to US

## 2020-02-03 ENCOUNTER — Inpatient Hospital Stay (HOSPITAL_COMMUNITY): Payer: BC Managed Care – PPO

## 2020-02-03 DIAGNOSIS — R570 Cardiogenic shock: Secondary | ICD-10-CM | POA: Diagnosis present

## 2020-02-03 DIAGNOSIS — I361 Nonrheumatic tricuspid (valve) insufficiency: Secondary | ICD-10-CM

## 2020-02-03 DIAGNOSIS — I340 Nonrheumatic mitral (valve) insufficiency: Secondary | ICD-10-CM

## 2020-02-03 LAB — COMPREHENSIVE METABOLIC PANEL
ALT: 575 U/L — ABNORMAL HIGH (ref 0–44)
ALT: 470 U/L — ABNORMAL HIGH (ref 0–44)
AST: 359 U/L — ABNORMAL HIGH (ref 15–41)
AST: 202 U/L — ABNORMAL HIGH (ref 15–41)
Albumin: 3.5 g/dL (ref 3.5–5.0)
Albumin: 3.1 g/dL — ABNORMAL LOW (ref 3.5–5.0)
Alkaline Phosphatase: 84 U/L (ref 38–126)
Alkaline Phosphatase: 72 U/L (ref 38–126)
Anion gap: 11 (ref 5–15)
Anion gap: 12 (ref 5–15)
BUN: 27 mg/dL — ABNORMAL HIGH (ref 8–23)
BUN: 26 mg/dL — ABNORMAL HIGH (ref 8–23)
CO2: 22 mmol/L (ref 22–32)
CO2: 23 mmol/L (ref 22–32)
Calcium: 8.8 mg/dL — ABNORMAL LOW (ref 8.9–10.3)
Calcium: 8.2 mg/dL — ABNORMAL LOW (ref 8.9–10.3)
Chloride: 109 mmol/L (ref 98–111)
Chloride: 107 mmol/L (ref 98–111)
Creatinine, Ser: 1.52 mg/dL — ABNORMAL HIGH (ref 0.61–1.24)
Creatinine, Ser: 1.27 mg/dL — ABNORMAL HIGH (ref 0.61–1.24)
GFR calc Af Amer: 56 mL/min — ABNORMAL LOW (ref >60)
GFR calc Af Amer: >60 mL/min (ref >60)
GFR calc non Af Amer: 49 mL/min — ABNORMAL LOW (ref >60)
GFR calc non Af Amer: >60 mL/min (ref >60)
Glucose, Bld: 110 mg/dL — ABNORMAL HIGH (ref 70–99)
Glucose, Bld: 96 mg/dL (ref 70–99)
Potassium: 3.2 mmol/L — ABNORMAL LOW (ref 3.5–5.1)
Potassium: 3.3 mmol/L — ABNORMAL LOW (ref 3.5–5.1)
Sodium: 142 mmol/L (ref 135–145)
Sodium: 142 mmol/L (ref 135–145)
Total Bilirubin: 3.9 mg/dL — ABNORMAL HIGH (ref 0.3–1.2)
Total Bilirubin: 3.5 mg/dL — ABNORMAL HIGH (ref 0.3–1.2)
Total Protein: 6.2 g/dL — ABNORMAL LOW (ref 6.5–8.1)
Total Protein: 5.5 g/dL — ABNORMAL LOW (ref 6.5–8.1)

## 2020-02-03 LAB — COOXEMETRY PANEL
Carboxyhemoglobin: 1.5 % (ref 0.5–1.5)
Carboxyhemoglobin: 1.4 % (ref 0.5–1.5)
Methemoglobin: 1.1 % (ref 0.0–1.5)
Methemoglobin: 1.2 % (ref 0.0–1.5)
O2 Saturation: 46.4 %
O2 Saturation: 49.6 %
Total hemoglobin: 11 g/dL — ABNORMAL LOW (ref 12.0–16.0)
Total hemoglobin: 11.2 g/dL — ABNORMAL LOW (ref 12.0–16.0)

## 2020-02-03 LAB — RAPID URINE DRUG SCREEN, HOSP PERFORMED
Amphetamines: NOT DETECTED
Barbiturates: NOT DETECTED
Benzodiazepines: NOT DETECTED
Cocaine: NOT DETECTED
Opiates: NOT DETECTED
Tetrahydrocannabinol: NOT DETECTED

## 2020-02-03 LAB — GLUCOSE, CAPILLARY
Glucose-Capillary: 96 mg/dL (ref 70–99)
Glucose-Capillary: 102 mg/dL — ABNORMAL HIGH (ref 70–99)
Glucose-Capillary: 120 mg/dL — ABNORMAL HIGH (ref 70–99)
Glucose-Capillary: 108 mg/dL — ABNORMAL HIGH (ref 70–99)

## 2020-02-03 LAB — ECHOCARDIOGRAM COMPLETE
Height: 70 in
Weight: 3200 oz

## 2020-02-03 LAB — HEPARIN LEVEL (UNFRACTIONATED)
Heparin Unfractionated: 0.11 IU/mL — ABNORMAL LOW (ref 0.30–0.70)
Heparin Unfractionated: 1.44 IU/mL — ABNORMAL HIGH (ref 0.30–0.70)
Heparin Unfractionated: 0.1 IU/mL — ABNORMAL LOW (ref 0.30–0.70)

## 2020-02-03 LAB — MAGNESIUM: Magnesium: 2 mg/dL (ref 1.7–2.4)

## 2020-02-03 LAB — LACTIC ACID, PLASMA
Lactic Acid, Venous: 2.1 mmol/L (ref 0.5–1.9)
Lactic Acid, Venous: 1.7 mmol/L (ref 0.5–1.9)
Lactic Acid, Venous: 1.2 mmol/L (ref 0.5–1.9)

## 2020-02-03 LAB — APTT: aPTT: 40 seconds — ABNORMAL HIGH (ref 24–36)

## 2020-02-03 MED ORDER — SODIUM CHLORIDE 0.9% FLUSH
10.0000 mL | INTRAVENOUS | Status: DC | PRN
  Administered 2020-02-13: 10:00:00 10 mL

## 2020-02-03 MED ORDER — PERFLUTREN LIPID MICROSPHERE
INTRAVENOUS | Status: AC
  Filled 2020-02-03: qty 10

## 2020-02-03 MED ORDER — DIGOXIN 125 MCG PO TABS
0.1250 mg | ORAL_TABLET | Freq: Every day | ORAL | Status: DC
  Administered 2020-02-03 – 2020-02-14 (×12): 0.125 mg via ORAL
  Filled 2020-02-03 (×12): qty 1

## 2020-02-03 MED ORDER — MAGNESIUM SULFATE 2 GM/50ML IV SOLN
2.0000 g | Freq: Once | INTRAVENOUS | Status: AC
  Administered 2020-02-03: 2 g via INTRAVENOUS
  Filled 2020-02-03: qty 50

## 2020-02-03 MED ORDER — PERFLUTREN LIPID MICROSPHERE
1.0000 mL | INTRAVENOUS | Status: AC | PRN
  Administered 2020-02-03: 3 mL via INTRAVENOUS
  Filled 2020-02-03: qty 10

## 2020-02-03 MED ORDER — ADULT MULTIVITAMIN W/MINERALS CH
1.0000 | ORAL_TABLET | Freq: Every day | ORAL | Status: DC
  Administered 2020-02-04 – 2020-02-14 (×11): 1 via ORAL
  Filled 2020-02-03 (×11): qty 1

## 2020-02-03 MED ORDER — POTASSIUM CHLORIDE ER 10 MEQ PO TBCR
40.0000 meq | EXTENDED_RELEASE_TABLET | Freq: Once | ORAL | Status: AC
  Administered 2020-02-03: 40 meq via ORAL
  Filled 2020-02-03 (×2): qty 4

## 2020-02-03 MED ORDER — SODIUM CHLORIDE 0.9% FLUSH
10.0000 mL | Freq: Two times a day (BID) | INTRAVENOUS | Status: DC
  Administered 2020-02-05: 20 mL
  Administered 2020-02-05 – 2020-02-14 (×16): 10 mL

## 2020-02-03 MED ORDER — POTASSIUM CHLORIDE CRYS ER 20 MEQ PO TBCR
40.0000 meq | EXTENDED_RELEASE_TABLET | Freq: Two times a day (BID) | ORAL | Status: DC
  Administered 2020-02-03 – 2020-02-05 (×6): 40 meq via ORAL
  Filled 2020-02-03 (×6): qty 2

## 2020-02-03 MED ORDER — GLUCERNA SHAKE PO LIQD
237.0000 mL | Freq: Three times a day (TID) | ORAL | Status: DC
  Administered 2020-02-03 – 2020-02-14 (×26): 237 mL via ORAL
  Filled 2020-02-03 (×5): qty 237

## 2020-02-03 MED ORDER — CHLORHEXIDINE GLUCONATE CLOTH 2 % EX PADS
6.0000 | MEDICATED_PAD | Freq: Every day | CUTANEOUS | Status: DC
  Administered 2020-02-03 – 2020-02-13 (×10): 6 via TOPICAL

## 2020-02-03 NOTE — Progress Notes (Signed)
  Echocardiogram 2D Echocardiogram has been performed.  Jennette Dubin 02/03/2020, 9:31 AM

## 2020-02-03 NOTE — Progress Notes (Signed)
Subjective: Pt seen at the bedside this morning. More alert and interactive today, though with increased latency in responses. Does not remember events preceding his presentation. Denies chest pain, trouble breathing, or any other acute complaints.   Objective:  Vital signs in last 24 hours: Vitals:   02/03/20 0214 02/03/20 0218 02/03/20 0230 02/03/20 0419  BP: (!) 85/58 (!) 86/66 93/69 99/63   Pulse: 84 85 82 81  Resp:      Temp:    99.8 F (37.7 C)  TempSrc:    Oral  SpO2:    95%  Weight:    90.7 kg  Height:       Physical Exam Vitals and nursing note reviewed.  Constitutional:      Appearance: He is ill-appearing.  Pulmonary:     Effort: Pulmonary effort is normal.  Musculoskeletal:     Comments: PICC placed in LUE. Bilateral LE pitting edema +1 to mid-shin, mild improvement from yesterday.  Skin:    General: Skin is warm and dry.     Comments: Distal extremities warm to touch, much improved from presentation.   Neurological:     Mental Status: He is alert.  Psychiatric:        Mood and Affect: Affect is blunt.        Speech: Speech is delayed.        Behavior: Behavior is slowed.    Assessment/Plan:  Active Problems:   Acute exacerbation of CHF (congestive heart failure) (HCC)   Acute encephalopathy   Cardiogenic shock [facility])  Tony Alvarez is a 64 year old M with significant PMH of ischemic cardiomyopathy s/p STEMI and PCI in 01/2019, HFrEF (last EF 25-30% in 08/2019), HTN, HLD, OSA, and depression who presented with altered mental status.   Altered mental status CT head and neck in the ED without acute abnormality. CK normal x 2. No obvious electrolyte derangements. Unclear if combination of catatonia vs polypharmacy vs poor perfusion from low output state vs hepatic encephalopathy. - mental status minimally improved - ativan 0.5mg  BID PRN to avoid withdrawal  Cardiogenic shock Acute HFrEF exacerbation - EF 25-30% in 08/2019, now 15-20% Ischemic  cardiomyopathy s/p STEMI and PCI in 01/2019 LE edema, cool extremities, and BNP >2,800 on presentation, consistent with cardiogenic shock. Started lasix drip, milrinone, and heparin for mural thrombus. - repeat echo with further reduced LVEF of 15-20%, no LV thrombus, extensive akinesis, grade III diastolic dysfunction, moderately reduced RV function with moderately elevated PA pressure, severely dilated RA and LA, and suggested RA pressure of 86mmHg - BP worsening overnight with SBP 80-90s - lactate downtrending 2.9 > 2.1 > 1.7 - net negative 2.7L overnight - PICC placed this AM - dig level undetectable  - Foley in place - strict I/O's and daily weights  Cardiology consulted, appreciate input and recommendations - hypotensive with low-output despite 0.16mcg milrinone - increase milrinone to 0.364mcg - may need switch to dobutamine vs add norepi - negative >2L and CVP now 4 - stop lasix drip - add digoxin 0.125mg  - on heparin drip for possible invasive hemodynamics - may need transfer to ICU if aggressive care pursued as pt may need additional/mechanical support  AKI Cr 1.76 on admission, priors 1.0-1.3. Renal ultrasound significant for increase echogenicity possibility secondary to medical renal disease. - suspect secondary to above from vascular congestion vs low output state  - Cr improved this AM 1.6 > 1.27 - holding home spironolactone   Elevated AST/ALT  RUQ ultrasound with no acute  hepatobiliary disease. No documented history of cirrhosis. Salicylate and acetaminophen negative. EtOH negative. HBV core antibody and surface antibody negative. - suspect secondary to poor perfusion from low output state - AST and ALT mild improved (311 > 202 and 611 > 470) - T bili 4.0 > 3.5 - continue to monitor CMPs   Prior to Admission Living Arrangement: home Anticipated Discharge Location: TBD Barriers to Discharge: clinical improvement Dispo: Anticipated discharge pending clinical  course.  Tony Horns, MD 02/03/2020, 7:13 AM Pager: [phone]

## 2020-02-03 NOTE — Progress Notes (Signed)
[facility] for Heparin (without bolus) Indication: History of Mural Thrombus  No Known Allergies  Patient Measurements: Height: 5\' 10"  (177.8 cm) Weight: 200 lb (90.7 kg) IBW/kg (Calculated) : 73 Heparin Dosing Weight:  91.9 kg  Vital Signs: Temp: 99.2 F (37.3 C) (03/26 1223) Temp Source: Oral (03/26 1223) BP: 90/63 (03/26 1715)  Labs: Recent Labs    02/02/20 1343 02/02/20 1343 02/02/20 1354 02/02/20 1354 02/02/20 1538 02/02/20 1839 02/02/20 1909 02/02/20 2125 02/02/20 2311 02/03/20 0554 02/03/20 1512 02/03/20 1658  HGB 14.3   < > 15.3  15.6  --   --   --   --   --  12.4*  --   --   --   HCT 45.3  --  45.0  46.0  --   --   --   --   --  39.0  --   --   --   PLT 207  --   --   --   --   --   --   --  184  --   --   --   APTT  --   --   --   --   --  38*  --   --  40*  --   --   --   LABPROT  --   --   --   --   --  21.0*  --   --   --   --   --   --   INR  --   --   --   --   --  1.8*  --   --   --   --   --   --   HEPARINUNFRC  --   --   --   --   --   --   --   --   --  0.11* 1.44* 0.10*  CREATININE 1.76*   < > 1.60*   < >  --   --   --  1.59* 1.52* 1.27*  --   --   CKTOTAL 159  --   --   --   --   --  151  --   --   --   --   --   TROPONINIHS 36*  --   --   --  42*  --   --   --   --   --   --   --    < > = values in this interval not displayed.    Estimated Creatinine Clearance: 69.2 mL/min (A) (by C-G formula based on SCr of 1.27 mg/dL (H)).   Medical History: Past Medical History:  Diagnosis Date  . Barrett's esophagus 04/21/2018  . CAD (coronary artery disease)    a. s/p STEMI in 01/2019 with DES to proximal LAD  . CHF (congestive heart failure) ([place])    a. EF 35-40% by echo in 01/2019, at 25-30% by repeat echo in 05/2019 and 08/2019  . Depression   . High cholesterol   . Hypertension   . Hypothyroid   . Sleep apnea    wears CPAP at home   Assessment: 64 yr old male with hx of ischemic cardiomyopathy S/P  STEMI and PCI in 01/2019, HFrEF (last EF 25-30% in 08/2019), HTN, HLD, OSA, and depression presented with AMS and potential cardiogenic shock. Pt was previously on Xarelto for mural thrombus, with unknown compliance (not on  current med list, but pt told RN he was taking Xarelto; however, pt is confused). Pharmacy is consulted to dose heparin for history or mural thrombus.  -heparin level= 0.1 after increase to 1500 units/hr  Goal of Therapy:  Heparin level 0.3-0.7 units/ml Monitor platelets by anticoagulation protocol: Yes   Plan:  -increase heparin to 1750 units/hr -Heparin level in 6 hours and daily wth CBC daily  Hildred Laser, PharmD Clinical Pharmacist **Pharmacist phone directory can now be found on [email or web address] ([email or web address]).  Listed under [place].

## 2020-02-03 NOTE — Progress Notes (Addendum)
Advanced Heart Failure Rounding Note  PCP-Cardiologist: Shelva Majestic, MD   Subjective:    Yesterday started on milrinone 0.25 mcg. Todays CO-OX 46%.   ECHO EF 15-20 RV moderately reduced.   Denies SOB. Says he is 64 years old.   Objective:   Weight Range: 90.7 kg Body mass index is 28.7 kg/m.   Vital Signs:   Temp:  [97.8 F (36.6 C)-99.8 F (37.7 C)] 99.8 F (37.7 C) (03/26 0757) Pulse Rate:  [78-103] 81 (03/26 0419) Resp:  [12-53] 23 (03/26 1020) BP: (82-119)/(57-103) 93/57 (03/26 1000) SpO2:  [92 %-100 %] 100 % (03/26 1000) Weight:  [90.7 kg-93.4 kg] 90.7 kg (03/26 0419)    Weight change: Filed Weights   02/02/20 2100 02/03/20 0419  Weight: 93.4 kg 90.7 kg    Intake/Output:   Intake/Output Summary (Last 24 hours) at 02/03/2020 1158 Last data filed at 02/03/2020 1000 Gross per 24 hour  Intake 482.48 ml  Output 3250 ml  Net -2767.52 ml      Physical Exam   CVP 3-4  General:  . No resp difficulty HEENT: Normal Neck: Supple. JVP flat . Carotids 2+ bilat; no bruits. No lymphadenopathy or thyromegaly appreciated. Cor: PMI nondisplaced. Regular rate & rhythm. No rubs, or murmurs. +S3  Lungs: Clear Abdomen: Soft, nontender, nondistended. No hepatosplenomegaly. No bruits or masses. Good bowel sounds. Extremities: No cyanosis, clubbing, rash, R and LLE trace edema Neuro: Alert & orientedx3, cranial nerves grossly intact. moves all 4 extremities w/o difficulty. Affect flat    Telemetry   SR 80s   EKG    n/a  Labs    CBC Recent Labs    02/02/20 1343 02/02/20 1343 02/02/20 1354 02/02/20 2311  WBC 11.0*  --   --  10.1  NEUTROABS 8.8*  --   --  8.3*  HGB 14.3   < > 15.3  15.6 12.4*  HCT 45.3   < > 45.0  46.0 39.0  MCV 90.1  --   --  85.9  PLT 207  --   --  184   < > = values in this interval not displayed.   Basic Metabolic Panel Recent Labs    02/02/20 1839 02/02/20 2125 02/02/20 2311 02/03/20 0554  NA  --    < > 142 142  K  --     < > 3.2* 3.3*  CL  --    < > 109 107  CO2  --    < > 22 23  GLUCOSE  --    < > 110* 96  BUN  --    < > 27* 26*  CREATININE  --    < > 1.52* 1.27*  CALCIUM  --    < > 8.8* 8.2*  MG 1.9  --   --  2.0  PHOS 3.5  --   --   --    < > = values in this interval not displayed.   Liver Function Tests Recent Labs    02/02/20 2311 02/03/20 0554  AST 359* 202*  ALT 575* 470*  ALKPHOS 84 72  BILITOT 3.9* 3.5*  PROT 6.2* 5.5*  ALBUMIN 3.5 3.1*   No results for input(s): LIPASE, AMYLASE in the last 72 hours. Cardiac Enzymes Recent Labs    02/02/20 1343 02/02/20 1909  CKTOTAL 159 151    BNP: BNP (last 3 results) Recent Labs    12/02/19 1146 12/14/19 1048 02/02/20 1343  BNP 656.0* 438.6* 2,899.9*  ProBNP (last 3 results) Recent Labs    07/26/19 1102  PROBNP 992*     D-Dimer No results for input(s): DDIMER in the last 72 hours. Hemoglobin A1C Recent Labs    02/02/20 1840  HGBA1C 6.2*   Fasting Lipid Panel No results for input(s): CHOL, HDL, LDLCALC, TRIG, CHOLHDL, LDLDIRECT in the last 72 hours. Thyroid Function Tests Recent Labs    02/02/20 2012  TSH 26.296*    Other results:   Imaging    CT Head Wo Contrast  Result Date: 02/02/2020 CLINICAL DATA:  Recent fall EXAM: CT HEAD WITHOUT CONTRAST CT CERVICAL SPINE WITHOUT CONTRAST TECHNIQUE: Multidetector CT imaging of the head and cervical spine was performed following the standard protocol without intravenous contrast. Multiplanar CT image reconstructions of the cervical spine were also generated. COMPARISON:  01/29/2019 FINDINGS: CT HEAD FINDINGS Brain: No evidence of acute infarction, hemorrhage, hydrocephalus, extra-axial collection or mass lesion/mass effect. Vascular: No hyperdense vessel or unexpected calcification. Skull: Normal. Negative for fracture or focal lesion. Sinuses/Orbits: No acute finding. Other: None. CT CERVICAL SPINE FINDINGS Alignment: Within normal limits. Skull base and vertebrae: 7  cervical segments are well visualized. Vertebral body height is well maintained. No acute fracture or acute facet abnormality is noted. Disc space narrowing is noted at C4-5 6 and C6-7. Multilevel osteophytic changes are noted. Soft tissues and spinal canal: Surrounding soft tissue structures are within normal limits. Pacemaker device is noted on the left. Upper chest: See is the lung apices demonstrate emphysematous change. Other: None IMPRESSION: CT of the head: No acute intracranial abnormality noted. CT of the cervical spine: Multilevel degenerative change without acute abnormality. Electronically Signed   By: Inez Catalina M.D.   On: 02/02/2020 16:23   CT Cervical Spine Wo Contrast  Result Date: 02/02/2020 CLINICAL DATA:  Recent fall EXAM: CT HEAD WITHOUT CONTRAST CT CERVICAL SPINE WITHOUT CONTRAST TECHNIQUE: Multidetector CT imaging of the head and cervical spine was performed following the standard protocol without intravenous contrast. Multiplanar CT image reconstructions of the cervical spine were also generated. COMPARISON:  01/29/2019 FINDINGS: CT HEAD FINDINGS Brain: No evidence of acute infarction, hemorrhage, hydrocephalus, extra-axial collection or mass lesion/mass effect. Vascular: No hyperdense vessel or unexpected calcification. Skull: Normal. Negative for fracture or focal lesion. Sinuses/Orbits: No acute finding. Other: None. CT CERVICAL SPINE FINDINGS Alignment: Within normal limits. Skull base and vertebrae: 7 cervical segments are well visualized. Vertebral body height is well maintained. No acute fracture or acute facet abnormality is noted. Disc space narrowing is noted at C4-5 6 and C6-7. Multilevel osteophytic changes are noted. Soft tissues and spinal canal: Surrounding soft tissue structures are within normal limits. Pacemaker device is noted on the left. Upper chest: See is the lung apices demonstrate emphysematous change. Other: None IMPRESSION: CT of the head: No acute intracranial  abnormality noted. CT of the cervical spine: Multilevel degenerative change without acute abnormality. Electronically Signed   By: Inez Catalina M.D.   On: 02/02/2020 16:23   US RENAL  Result Date: 02/02/2020 CLINICAL DATA:  Acute renal insufficiency. EXAM: RENAL / URINARY TRACT ULTRASOUND COMPLETE COMPARISON:  None. FINDINGS: Right Kidney: Renal measurements: 9.7 cm x 4.2 cm x 4.0 cm = volume: 84.50 mL. Diffusely increased echogenicity of the renal parenchyma is seen. No mass or hydronephrosis visualized. Left Kidney: Renal measurements: 11.7 cm x 5.7 cm x 4.0 cm = volume: 104.60 mL. Diffusely increased echogenicity of the renal parenchyma is seen. A 2.3 cm x 2.4 cm x 2.0 cm anechoic  structure is seen within the left kidney. No flow is seen within this region on color Doppler evaluation. No hydronephrosis visualized. Bladder: A Foley catheter is seen within the urinary bladder. Other: None. IMPRESSION: 1. Left renal cyst. 2. Increased renal echogenicity which may be secondary to medical renal disease. Electronically Signed   By: Virgina Norfolk M.D.   On: 02/02/2020 19:04   DG Chest Port 1 View  Result Date: 02/02/2020 CLINICAL DATA:  Shortness of breath. EXAM: PORTABLE CHEST 1 VIEW COMPARISON:  December 12, 2019. FINDINGS: Stable cardiomegaly with central pulmonary vascular congestion. No pneumothorax is noted. Single lead left-sided pacemaker is unchanged in position. Mild bibasilar atelectasis or edema is noted. Bony thorax is unremarkable. IMPRESSION: Stable cardiomegaly with central pulmonary vascular congestion. Mild bibasilar atelectasis or edema is noted. Electronically Signed   By: Marijo Conception M.D.   On: 02/02/2020 13:57   ECHOCARDIOGRAM COMPLETE  Result Date: 02/03/2020    ECHOCARDIOGRAM REPORT   Patient Name:   Tony Alvarez Date of Exam: 02/03/2020 Medical Rec #:  [id number]    Height:       70.0 in Accession #:    [id number]   Weight:       200.0 lb Date of Birth:  [birth year]    BSA:           2.087 m Patient Age:    70 years     BP:           98/68 mmHg Patient Gender: M            HR:           79 bpm. Exam Location:  Inpatient Procedure: 2D Echo STAT ECHO Indications:    Congestive Heart Failure I50.9  History:        Patient has prior history of Echocardiogram examinations, most                 recent 08/17/2019. CHF, CAD, Defibrillator; Risk                 Factors:Hypertension.  Sonographer:    Mikki Santee RDCS (AE) Referring Phys: [id number] [place]  1. No LV thrombus on contrast imaging. Extensive akinesis of mid septum, all apical segmemts, and apex consistent with prior LAD infarction/aneurysm. Left ventricular ejection fraction, by estimation, is 15-20%. The left ventricle has severely decreased  function. The left ventricle demonstrates regional wall motion abnormalities (see scoring diagram/findings for description). The left ventricular internal cavity size was mildly dilated. Left ventricular diastolic parameters are consistent with Grade III diastolic dysfunction (restrictive). Elevated left atrial pressure.  2. Right ventricular systolic function is moderately reduced. The right ventricular size is moderately enlarged. There is moderately elevated pulmonary artery systolic pressure. The estimated right ventricular systolic pressure is [id number] mmHg.  3. Left atrial size was severely dilated.  4. Right atrial size was severely dilated.  5. The mitral valve is grossly normal. Mild to moderate mitral valve regurgitation. No evidence of mitral stenosis.  6. Tricuspid valve regurgitation is mild to moderate.  7. The aortic valve is tricuspid. Aortic valve regurgitation is not visualized. No aortic stenosis is present.  8. The inferior vena cava is dilated in size with <50% respiratory variability, suggesting right atrial pressure of 15 mmHg. Comparison(s): A prior study was performed on 08/17/2019. Changes from prior study are noted. EF now 15-20%. FINDINGS  Left Ventricle:  No LV thrombus on contrast imaging. Extensive akinesis of mid septum, all apical  segmemts, and apex consistent with prior LAD infarction/aneurysm. Left ventricular ejection fraction, by estimation, is 15-20%. The left ventricle has severely decreased function. The left ventricle demonstrates regional wall motion abnormalities. Definity contrast agent was given IV to delineate the left ventricular endocardial borders. The left ventricular internal cavity size was mildly dilated. There is no left ventricular hypertrophy. Left ventricular diastolic parameters are consistent with Grade III diastolic dysfunction (restrictive). Elevated left atrial pressure.  LV Wall Scoring: The entire apex and mid inferoseptal segment are akinetic. Right Ventricle: The right ventricular size is moderately enlarged. No increase in right ventricular wall thickness. Right ventricular systolic function is moderately reduced. There is moderately elevated pulmonary artery systolic pressure. The tricuspid  regurgitant velocity is 3.14 m/s, and with an assumed right atrial pressure of 15 mmHg, the estimated right ventricular systolic pressure is [id number] mmHg. Left Atrium: Left atrial size was severely dilated. Right Atrium: Right atrial size was severely dilated. Pericardium: Trivial pericardial effusion is present. Mitral Valve: The mitral valve is grossly normal. Mild to moderate mitral valve regurgitation. No evidence of mitral valve stenosis. Tricuspid Valve: The tricuspid valve is grossly normal. Tricuspid valve regurgitation is mild to moderate. No evidence of tricuspid stenosis. Aortic Valve: The aortic valve is tricuspid. Aortic valve regurgitation is not visualized. No aortic stenosis is present. Pulmonic Valve: The pulmonic valve was grossly normal. Pulmonic valve regurgitation is not visualized. No evidence of pulmonic stenosis. Aorta: The aortic root and ascending aorta are structurally normal, with no evidence of dilitation. Venous:  The inferior vena cava is dilated in size with less than 50% respiratory variability, suggesting right atrial pressure of 15 mmHg. IAS/Shunts: No atrial level shunt detected by color flow Doppler. Additional Comments: A pacer wire is visualized in the right atrium and right ventricle.  LEFT VENTRICLE PLAX 2D LVIDd:         6.30 cm  Diastology LVIDs:         5.00 cm  LV e' lateral:   6.00 cm/s LV PW:         0.90 cm  LV E/e' lateral: 13.8 LV IVS:        0.90 cm  LV e' medial:    3.32 cm/s LVOT diam:     2.30 cm  LV E/e' medial:  25.0 LV SV:         37 LV SV Index:   18 LVOT Area:     4.15 cm  RIGHT VENTRICLE RV S prime:     11.60 cm/s TAPSE (M-mode): 1.5 cm LEFT ATRIUM            Index       RIGHT ATRIUM           Index LA diam:      5.00 cm  2.40 cm/m  RA Area:     25.40 cm LA Vol (A2C): 126.0 ml 60.36 ml/m RA Volume:   74.00 ml  35.45 ml/m LA Vol (A4C): 75.4 ml  36.12 ml/m  AORTIC VALVE LVOT Vmax:   60.27 cm/s LVOT Vmean:  37.733 cm/s LVOT VTI:    0.089 m  AORTA Ao Root diam: 2.80 cm MITRAL VALVE               TRICUSPID VALVE MV Area (PHT): 3.12 cm    TR Peak grad:   39.4 mmHg MV Decel Time: 243 msec    TR Vmax:        314.00 cm/s MR Peak grad: 40.7 mmHg MR  Vmax:      319.00 cm/s  SHUNTS MV E velocity: 83.10 cm/s  Systemic VTI:  0.09 m MV A velocity: 26.10 cm/s  Systemic Diam: 2.30 cm MV E/A ratio:  3.18 Eleonore Chiquito MD Electronically signed by Eleonore Chiquito MD Signature Date/Time: 02/03/2020/11:01:50 AM    Final    [place] EKG SITE RITE  Result Date: 02/02/2020 If Site Rite image not attached, placement could not be confirmed due to current cardiac rhythm.  US Abdomen Limited RUQ  Result Date: 02/02/2020 CLINICAL DATA:  Elevated LFTs. EXAM: ULTRASOUND ABDOMEN LIMITED RIGHT UPPER QUADRANT COMPARISON:  None. FINDINGS: Gallbladder: No gallstones or wall thickening visualized. No sonographic Murphy sign noted by sonographer. Common bile duct: Diameter: 5 mm. Liver: No focal lesion identified. Within normal  limits in parenchymal echogenicity. Portal vein is patent on color Doppler imaging with normal direction of blood flow towards the liver. Other: None. IMPRESSION: No acute hepatobiliary disease. Electronically Signed   By: Marin Olp M.D.   On: 02/02/2020 19:08      Medications:     Scheduled Medications: . Chlorhexidine Gluconate Cloth  6 each Topical Daily  . clopidogrel  75 mg Oral Daily  . insulin aspart  0-6 Units Subcutaneous TID WC  . levothyroxine  125 mcg Oral QAC breakfast  . pantoprazole  40 mg Oral Daily  . perflutren lipid microspheres (DEFINITY) IV suspension      . potassium chloride  40 mEq Oral BID  . sodium chloride flush  10-40 mL Intracatheter Q12H     Infusions: . furosemide (LASIX) infusion 4 mg/hr (02/02/20 2343)  . heparin 1,500 Units/hr (02/03/20 0641)  . milrinone 0.125 mcg/kg/min (02/03/20 0221)     PRN Medications:  LORazepam, sodium chloride flush     Assessment/Plan  1. Cardiogenic Shock  - Has ICD  - ECHO this admit EF 15-20% with RV moderately reduce. In 2020 EF 25-30%  - Lactic Acid trending down 2.9>2.1>1.7  Hypotensive today with low output despite milrinone 0.25 mcg. CO-OX 46%.  - Increase milrinone 0.375 mcg. May need to switch to dobutamine versus adding Norepi. If norepi added will need to transfer to ICU.  -Placed on lasix drip.Negative >2 liters.  Now CVP down to 4. Stop lasix drip.   -- Hold beta blocker due to low output state. Add 0.125 mg digoxin.  -- Hold spironolactone due to AKI.   2. CAD s/p STEMI, PCI 01/2019 -- Continue plavix + statin.  -- no ASA given concomitant anticoagulation.  -- Ok to d/c plavix if planned procedures as > 1 year since intervention.   3. History of LV Thrombus -- On Xarelto as outpatient.  - On heparin drip for possible invasive hemodynamics this admission.   4. AKI  -In the setting of low output HF.   -Creatinine trending down from 1.5>1.2 with addition of milrinone. -Follow BMET    5. Hypothyroidism  -On levothyroxine.  6. AMS -CT of head was negative.  - UDS negative.  - Suspect related to low output HF.    7. Anemia  Hgb 8.3  No obvious source.   8. Hypokalemia  Supp K  Check CO-OX 1400.   - No family present. May need to move ICU if aggressive care pursued. Need Palliative for GOC,. Concerned we may need to additional support--> mechanical support.  - Need to determine social situation.      Length of Stay: 1  Amy Clegg, NP  02/03/2020, 11:58 AM  Advanced Heart Failure Team  Pager [phone] (M-F; [place])  Please contact [facility] for night-coverage after hours (4p -7a ) and weekends on [email or web address]  Patient seen and examined with the above-signed Advanced Practice Provider and/or Housestaff. I personally reviewed laboratory data, imaging studies and relevant notes. I independently examined the patient and formulated the important aspects of the plan. I have edited the note to reflect any of my changes or salient points. I have personally discussed the plan with the patient and/or family.  He is now out of shock with milrinone. Lactate has normalized. Creatinine and LFTs coming down however co-ox still low. Milrinone increased. He has diuresed sufficiently and CVP in 2-3 range (checked personally). Mental status better  General:  Weak appearing. No resp difficulty HEENT: normal Neck: supple. no JVD. Carotids 2+ bilat; no bruits. No lymphadenopathy or thryomegaly appreciated. Cor: PMI nondisplaced. Regular rate & rhythm. +s3. Lungs: clear Abdomen: soft, nontender, nondistended. No hepatosplenomegaly. No bruits or masses. Good bowel sounds. Extremities: no cyanosis, clubbing, rash, edema Neuro: alert & orientedx3, cranial nerves grossly intact. moves all 4 extremities w/o difficulty. Affect pleasant  Shock improving with milrinone but co-ox still low. I am not overly optimistic that we will be able to transition to an effective oral HF regimen.  Thus I have discussed the possibility of VAD placement with him. He said he would be interested in VAD support if he needed it. How he has very limited social support and says he can only rely on his neighbors. We will continue to adjust his HF meds and may initiate the VAD process to see if he is a candidate early next week depending on further discussions with him.   Glori Bickers, MD  8:49 PM

## 2020-02-03 NOTE — Progress Notes (Signed)
Spoke to MD, lasix discontinued Will continue to monitor

## 2020-02-03 NOTE — Progress Notes (Signed)
Mews score of 4. RR 37. Came to check on pt. He was resting in bed , no respiratory distress. Manual respiration count: 23

## 2020-02-03 NOTE — Plan of Care (Signed)
  Problem: Education: Goal: Ability to demonstrate management of disease process will improve Outcome: Progressing   Problem: Cardiac: Goal: Ability to achieve and maintain adequate cardiopulmonary perfusion will improve Outcome: Progressing   Problem: Health Behavior/Discharge Planning: Goal: Ability to manage health-related needs will improve Outcome: Progressing   Problem: Clinical Measurements: Goal: Cardiovascular complication will be avoided Outcome: Progressing

## 2020-02-03 NOTE — Progress Notes (Signed)
Initial Nutrition Assessment  DOCUMENTATION CODES:   Not applicable  INTERVENTION:   -Glucerna Shake po TID, each supplement provides 220 kcal and 10 grams of protein -MVI with minerals daily  NUTRITION DIAGNOSIS:   Increased nutrient needs related to chronic illness(CHF) as evidenced by estimated needs.  GOAL:   Patient will meet greater than or equal to 90% of their needs  MONITOR:   PO intake, Supplement acceptance, Diet advancement, Labs, Weight trends, Skin, I & O's  REASON FOR ASSESSMENT:   Malnutrition Screening Tool    ASSESSMENT:   Tony Alvarez is a 64 year old M with significant PMH of ischemic cardiomyopathy s/p STEMI and PCI in 01/2019, HFrEF (last EF 25-30% in 08/2019), HTN, HLD, OSA, and depression who presented with altered mental status. Pt seen at the bedside and intermittently following commands and answering questions. Says he is in "hospital," but otherwise appears to only respond by nodding head to indicate yes/no. Rest of history obtained per chart review. Pt found this afternoon by a neighbor in his home. Was hyperventilating and confused. ED provider spoke with sister, Cleda Clarks, who lives in [place] and had not spoken to the patient in 6 days. EMS initially called a STEMI, but cancelled after no change from prior EKG noted. Pt was tachycardic, RR >30 on 4L [place]. Given 324 ASA and 141mL NS bolus by EMS.  Pt admitted with AMS and CHF exacerbation.  Reviewed I/O's: -3.1 L x 24 hours  UOP: 3.3 L x 24 hours  Pt sitting up in bed at time of visit, eating graham crackers. He reports eating well PTA and denies any difficulty chewing or swallowing. Pt pleasant, but unable to give much history- replies "I don't know" to most questions asked.   SLP in room at time of visit; advanced pt to a regular diet with thin liquids.   Reviewed wt hx; noted pt has experienced a 13/3% wt loss over the past 3 months, which is significant for time frame. Suspect some wt loss may be  related to diuresis.   Lab Results  Component Value Date   HGBA1C 6.2 (H) 02/02/2020   PTA DM medications are none.   Labs reviewed: K: 3.3 (on PO supplementation), CBGS: 90-105 (inpatient orders for glycemic control are 0-6 units insulin aspart TID with meals).   NUTRITION - FOCUSED PHYSICAL EXAM:    Most Recent Value  Orbital Region  No depletion  Upper Arm Region  No depletion  Thoracic and Lumbar Region  No depletion  Buccal Region  No depletion  Temple Region  No depletion  Clavicle Bone Region  No depletion  Clavicle and Acromion Bone Region  No depletion  Scapular Bone Region  No depletion  Dorsal Hand  No depletion  Patellar Region  No depletion  Anterior Thigh Region  No depletion  Posterior Calf Region  No depletion  Edema (RD Assessment)  Mild  Hair  Reviewed  Eyes  Reviewed  Mouth  Reviewed  Skin  Reviewed  Nails  Reviewed       Diet Order:   Diet Order            Diet regular Room service appropriate? Yes with Assist; Fluid consistency: Thin  Diet effective now              EDUCATION NEEDS:   No education needs have been identified at this time  Skin:  Skin Assessment: Skin Integrity Issues: Skin Integrity Issues:: Other (Comment) Other: MASD to scrotum and buttocks  Last BM:  Unknown  Height:   Ht Readings from Last 1 Encounters:  02/02/20 5\' 10"  (1.778 m)    Weight:   Wt Readings from Last 1 Encounters:  02/03/20 90.7 kg    Ideal Body Weight:  75.5 kg  BMI:  Body mass index is 28.7 kg/m.  Estimated Nutritional Needs:   Kcal:  2050-2250  Protein:  110-125 grams  Fluid:  > 2 L    Loistine Chance, RD, LDN, [place] Registered Dietitian II Certified Diabetes Care and Education Specialist Please refer to [facility] for RD and/or RD on-call/weekend/after hours pager

## 2020-02-03 NOTE — Progress Notes (Signed)
Peripherally Inserted Central Catheter Placement  The IV Nurse has discussed with the patient and/or persons authorized to consent for the patient, the purpose of this procedure and the potential benefits and risks involved with this procedure.  The benefits include less needle sticks, lab draws from the catheter, and the patient may be discharged home with the catheter. Risks include, but not limited to, infection, bleeding, blood clot (thrombus formation), and puncture of an artery; nerve damage and irregular heartbeat and possibility to perform a PICC exchange if needed/ordered by physician.  Alternatives to this procedure were also discussed.  Bard Power PICC patient education guide, fact sheet on infection prevention and patient information card has been provided to patient /or left at bedside.    PICC Placement Documentation  PICC Double Lumen [id number] PICC Right Basilic 44 cm 0 cm (Active)  Indication for Insertion or Continuance of Line Vasoactive infusions 02/03/20 1037  Exposed Catheter (cm) 0 cm 02/03/20 1037  Site Assessment Clean;Dry;Intact 02/03/20 1037  Lumen #1 Status Flushed;Saline locked;Blood return noted 02/03/20 1037  Lumen #2 Status Flushed;Saline locked;Blood return noted 02/03/20 1037  Dressing Type Transparent;Securing device 02/03/20 1037  Dressing Status Clean;Dry;Intact;Antimicrobial disc in place 02/03/20 1037  Dressing Intervention New dressing 02/03/20 1037  Dressing Change Due 02/10/20 02/03/20 [place], Cheryl Stabenow 02/03/2020, 10:42 AM

## 2020-02-03 NOTE — Progress Notes (Signed)
Lactic acid 2.9, MD notified and aware, will continue to monitor, Thanks Arvella Nigh RN.

## 2020-02-03 NOTE — Evaluation (Signed)
Clinical/Bedside Swallow Evaluation Patient Details  Name: Tony Alvarez MRN: [id number] Date of Birth: 1956-07-06  Today's Date: 02/03/2020 Time: SLP Start Time (ACUTE ONLY): [id number] SLP Stop Time (ACUTE ONLY): 0942 SLP Time Calculation (min) (ACUTE ONLY): 16 min  Past Medical History:  Past Medical History:  Diagnosis Date  . Barrett's esophagus 04/21/2018  . CAD (coronary artery disease)    a. s/p STEMI in 01/2019 with DES to proximal LAD  . CHF (congestive heart failure) ([place])    a. EF 35-40% by echo in 01/2019, at 25-30% by repeat echo in 05/2019 and 08/2019  . Depression   . High cholesterol   . Hypertension   . Hypothyroid   . Sleep apnea    wears CPAP at home   Past Surgical History:  Past Surgical History:  Procedure Laterality Date  . BIOPSY  05/24/2018   Procedure: BIOPSY;  Surgeon: Rogene Houston, MD;  [facility];  Service: Endoscopy;;  gastric  . COLONOSCOPY    . COLONOSCOPY N/A 05/24/2018   Procedure: COLONOSCOPY;  Surgeon: Rogene Houston, MD;  [facility];  Service: Endoscopy;  Laterality: N/A;  8:55  . COLONOSCOPY WITH ESOPHAGOGASTRODUODENOSCOPY (EGD)    . CORONARY STENT INTERVENTION N/A 01/26/2019   Procedure: CORONARY STENT INTERVENTION;  Surgeon: Troy Sine, MD;  Location: [facility];  Service: Cardiovascular;  Laterality: N/A;  . CORONARY/GRAFT ACUTE MI REVASCULARIZATION N/A 01/26/2019   Procedure: Coronary/Graft Acute MI Revascularization;  Surgeon: Troy Sine, MD;  Location: [facility];  Service: Cardiovascular;  Laterality: N/A;  . ESOPHAGOGASTRODUODENOSCOPY N/A 05/24/2018   Procedure: ESOPHAGOGASTRODUODENOSCOPY (EGD);  Surgeon: Rogene Houston, MD;  [facility];  Service: Endoscopy;  Laterality: N/A;  . ICD IMPLANT N/A 12/12/2019   Procedure: ICD IMPLANT;  Surgeon: Deboraha Sprang, MD;  Location: [facility];  Service: Cardiovascular;  Laterality: N/A;  . LEFT HEART CATH AND CORONARY  ANGIOGRAPHY N/A 01/26/2019   Procedure: LEFT HEART CATH AND CORONARY ANGIOGRAPHY;  Surgeon: Troy Sine, MD;  Location: [facility] [facility];  Service: Cardiovascular;  Laterality: N/A;  . MEDIAL PARTIAL KNEE REPLACEMENT     2015  . POLYPECTOMY  05/24/2018   Procedure: POLYPECTOMY;  Surgeon: Rogene Houston, MD;  [facility];  Service: Endoscopy;;  colon  . rt hand injury    . shoulder surgery for bursitis    . TONSILLECTOMY     HPI:  Pt is a 64 year old M with significant PMH of ischemic cardiomyopathy s/p STEMI and PCI in 01/2019, HFrEF (last EF 25-30% in 08/2019), HTN, HLD, OSA, and depression who presented with altered mental status. CT of the head: no acute changes. WBC WNL at time of eval.    Assessment / Plan / Recommendation Clinical Impression  Pt was seen for bedside swallow evaluation and she denied a history of dysphagia. Oral mechanism exam was [facility] with adequate dentition. He tolerated all solids and liquids without signs or symptoms of oropharyngeal dysphagia. A regular texture diet is recommended at this time and further skilled SLP services are not clinically indicated for swallowing.  SLP Visit Diagnosis: Dysphagia, unspecified (R13.10)    Aspiration Risk  No limitations    Diet Recommendation Regular;Thin liquid   Liquid Administration via: Straw;Cup Medication Administration: Whole meds with liquid Supervision: Patient able to self feed Postural Changes: Seated upright at 90 degrees    Other  Recommendations Oral Care Recommendations: Oral care BID  Follow up Recommendations None      Frequency and Duration            Prognosis        Swallow Study   General Date of Onset: 02/02/20 HPI: Pt is a 64 year old M with significant PMH of ischemic cardiomyopathy s/p STEMI and PCI in 01/2019, HFrEF (last EF 25-30% in 08/2019), HTN, HLD, OSA, and depression who presented with altered mental status. CT of the head: no acute changes. WBC WNL at time of  eval.  Type of Study: Bedside Swallow Evaluation Previous Swallow Assessment: None Diet Prior to this Study: NPO Temperature Spikes Noted: No Respiratory Status: Nasal cannula History of Recent Intubation: No Behavior/Cognition: Alert;Cooperative;Pleasant mood Oral Cavity Assessment: Within Functional Limits Oral Care Completed by SLP: No Vision: Functional for self-feeding Self-Feeding Abilities: Able to feed self Patient Positioning: Upright in bed Baseline Vocal Quality: Normal Volitional Cough: Strong Volitional Swallow: Able to elicit    Oral/Motor/Sensory Function Overall Oral Motor/Sensory Function: Within functional limits   Ice Chips Ice chips: Within functional limits Presentation: Spoon   Thin Liquid Thin Liquid: Within functional limits Presentation: Straw    Nectar Thick Nectar Thick Liquid: Not tested   Honey Thick Honey Thick Liquid: Not tested   Puree Puree: Within functional limits Presentation: Spoon   Solid     Solid: Within functional limits Presentation: Smolan I. Hardin Negus, [place], [place] Office number [phone] Pager [phone]  Horton Marshall 02/03/2020,9:43 AM

## 2020-02-03 NOTE — Progress Notes (Signed)
[place] for Heparin (without bolus) Indication: History of Mural Thrombus  No Known Allergies  Patient Measurements: Height: 5\' 10"  (177.8 cm) Weight: 200 lb (90.7 kg) IBW/kg (Calculated) : 73 Heparin Dosing Weight:  91.9 kg  Vital Signs: Temp: 99.8 F (37.7 C) (03/26 0419) Temp Source: Oral (03/26 0419) BP: 99/63 (03/26 0419) Pulse Rate: 81 (03/26 0419)  Labs: Recent Labs    02/02/20 1343 02/02/20 1343 02/02/20 1354 02/02/20 1538 02/02/20 1839 02/02/20 1909 02/02/20 2125 02/02/20 2311 02/03/20 0554  HGB 14.3   < > 15.3  15.6  --   --   --   --  12.4*  --   HCT 45.3  --  45.0  46.0  --   --   --   --  39.0  --   PLT 207  --   --   --   --   --   --  184  --   APTT  --   --   --   --  38*  --   --  40*  --   LABPROT  --   --   --   --  21.0*  --   --   --   --   INR  --   --   --   --  1.8*  --   --   --   --   HEPARINUNFRC  --   --   --   --   --   --   --   --  0.11*  CREATININE 1.76*   < > 1.60*  --   --   --  1.59* 1.52*  --   CKTOTAL 159  --   --   --   --  151  --   --   --   TROPONINIHS 36*  --   --  42*  --   --   --   --   --    < > = values in this interval not displayed.    Estimated Creatinine Clearance: 57.8 mL/min (A) (by C-G formula based on SCr of 1.52 mg/dL (H)).   Medical History: Past Medical History:  Diagnosis Date  . Barrett's esophagus 04/21/2018  . CAD (coronary artery disease)    a. s/p STEMI in 01/2019 with DES to proximal LAD  . CHF (congestive heart failure) ([place])    a. EF 35-40% by echo in 01/2019, at 25-30% by repeat echo in 05/2019 and 08/2019  . Depression   . High cholesterol   . Hypertension   . Hypothyroid   . Sleep apnea    wears CPAP at home   Assessment: 64 yr old male with hx of ischemic cardiomyopathy S/P STEMI and PCI in 01/2019, HFrEF (last EF 25-30% in 08/2019), HTN, HLD, OSA, and depression presented with AMS and potential cardiogenic shock. Pt was previously on Xarelto  for mural thrombus, with unknown compliance (not on current med list, but pt told RN he was taking Xarelto as late as yesterday; however, pt is confused). Pharmacy is consulted to dose heparin for history or mural thrombus.   H/H, platelets WNL; admission INR 1.8. Given the uncertainty re: whether pt was taking Xarelto PTA, will order aPTT with first heparin level.  3/26 AM update:  Heparin level low No issues per RN  Goal of Therapy:  Heparin level 0.3-0.7 units/ml Monitor platelets by anticoagulation protocol: Yes   Plan:  Inc heparin to 1500 units/hr 1500 heparin level Monitor daily heparin level, aPTT, CBC Monitor for signs/symptoms of bleeding  Narda Bonds, PharmD, BCPS Clinical Pharmacist Phone: [phone]

## 2020-02-04 DIAGNOSIS — I2510 Atherosclerotic heart disease of native coronary artery without angina pectoris: Secondary | ICD-10-CM

## 2020-02-04 DIAGNOSIS — R4182 Altered mental status, unspecified: Secondary | ICD-10-CM

## 2020-02-04 DIAGNOSIS — R7401 Elevation of levels of liver transaminase levels: Secondary | ICD-10-CM

## 2020-02-04 DIAGNOSIS — Z7901 Long term (current) use of anticoagulants: Secondary | ICD-10-CM

## 2020-02-04 DIAGNOSIS — N400 Enlarged prostate without lower urinary tract symptoms: Secondary | ICD-10-CM

## 2020-02-04 DIAGNOSIS — Z86718 Personal history of other venous thrombosis and embolism: Secondary | ICD-10-CM

## 2020-02-04 DIAGNOSIS — Z960 Presence of urogenital implants: Secondary | ICD-10-CM

## 2020-02-04 LAB — COOXEMETRY PANEL
Carboxyhemoglobin: 1.7 % — ABNORMAL HIGH (ref 0.5–1.5)
Methemoglobin: 1 % (ref 0.0–1.5)
O2 Saturation: 70.9 %
Total hemoglobin: 11 g/dL — ABNORMAL LOW (ref 12.0–16.0)

## 2020-02-04 LAB — BASIC METABOLIC PANEL
Anion gap: 9 (ref 5–15)
BUN: 24 mg/dL — ABNORMAL HIGH (ref 8–23)
CO2: 24 mmol/L (ref 22–32)
Calcium: 8 mg/dL — ABNORMAL LOW (ref 8.9–10.3)
Chloride: 103 mmol/L (ref 98–111)
Creatinine, Ser: 1.09 mg/dL (ref 0.61–1.24)
GFR calc Af Amer: >60 mL/min (ref >60)
GFR calc non Af Amer: >60 mL/min (ref >60)
Glucose, Bld: 109 mg/dL — ABNORMAL HIGH (ref 70–99)
Potassium: 3.3 mmol/L — ABNORMAL LOW (ref 3.5–5.1)
Sodium: 136 mmol/L (ref 135–145)

## 2020-02-04 LAB — CBC
HCT: 32.9 % — ABNORMAL LOW (ref 39.0–52.0)
Hemoglobin: 10.7 g/dL — ABNORMAL LOW (ref 13.0–17.0)
MCH: 28 pg (ref 26.0–34.0)
MCHC: 32.5 g/dL (ref 30.0–36.0)
MCV: 86.1 fL (ref 80.0–100.0)
Platelets: 149 10*3/uL — ABNORMAL LOW (ref 150–400)
RBC: 3.82 MIL/uL — ABNORMAL LOW (ref 4.22–5.81)
RDW: 17.2 % — ABNORMAL HIGH (ref 11.5–15.5)
WBC: 9.1 10*3/uL (ref 4.0–10.5)
nRBC: 0.2 % (ref 0.0–0.2)

## 2020-02-04 LAB — GLUCOSE, CAPILLARY
Glucose-Capillary: 137 mg/dL — ABNORMAL HIGH (ref 70–99)
Glucose-Capillary: 133 mg/dL — ABNORMAL HIGH (ref 70–99)
Glucose-Capillary: 138 mg/dL — ABNORMAL HIGH (ref 70–99)
Glucose-Capillary: 124 mg/dL — ABNORMAL HIGH (ref 70–99)
Glucose-Capillary: 133 mg/dL — ABNORMAL HIGH (ref 70–99)
Glucose-Capillary: 108 mg/dL — ABNORMAL HIGH (ref 70–99)

## 2020-02-04 LAB — HCV INTERPRETATION

## 2020-02-04 LAB — HEPARIN LEVEL (UNFRACTIONATED)
Heparin Unfractionated: 0.26 IU/mL — ABNORMAL LOW (ref 0.30–0.70)
Heparin Unfractionated: 0.47 IU/mL (ref 0.30–0.70)
Heparin Unfractionated: 0.54 IU/mL (ref 0.30–0.70)

## 2020-02-04 LAB — HCV AB W REFLEX TO QUANT PCR: HCV Ab: <0.1 s/co ratio (ref 0.0–0.9)

## 2020-02-04 LAB — MAGNESIUM: Magnesium: 1.7 mg/dL (ref 1.7–2.4)

## 2020-02-04 LAB — HEPATITIS B SURFACE ANTIBODY, QUANTITATIVE: Hep B S AB Quant (Post): <3.1 m[IU]/mL — ABNORMAL LOW (ref >9.9)

## 2020-02-04 MED ORDER — MAGNESIUM SULFATE 4 GM/100ML IV SOLN
4.0000 g | Freq: Once | INTRAVENOUS | Status: AC
  Administered 2020-02-04: 4 g via INTRAVENOUS
  Filled 2020-02-04: qty 100

## 2020-02-04 MED ORDER — SPIRONOLACTONE 12.5 MG HALF TABLET
12.5000 mg | ORAL_TABLET | Freq: Every day | ORAL | Status: DC
  Administered 2020-02-04 – 2020-02-09 (×6): 12.5 mg via ORAL
  Filled 2020-02-04 (×7): qty 1

## 2020-02-04 MED ORDER — FOLIC ACID 1 MG PO TABS
1.0000 mg | ORAL_TABLET | Freq: Every day | ORAL | Status: DC
  Administered 2020-02-04 – 2020-02-14 (×11): 1 mg via ORAL
  Filled 2020-02-04 (×12): qty 1

## 2020-02-04 MED ORDER — FUROSEMIDE 40 MG PO TABS
40.0000 mg | ORAL_TABLET | Freq: Every day | ORAL | Status: DC
  Administered 2020-02-04 – 2020-02-08 (×5): 40 mg via ORAL
  Filled 2020-02-04 (×5): qty 1

## 2020-02-04 MED ORDER — ALBUTEROL SULFATE (2.5 MG/3ML) 0.083% IN NEBU: 3.0000 mL | INHALATION_SOLUTION | Freq: Four times a day (QID) | RESPIRATORY_TRACT | Status: DC | PRN

## 2020-02-04 MED ORDER — MAGNESIUM SULFATE 2 GM/50ML IV SOLN
2.0000 g | Freq: Once | INTRAVENOUS | Status: AC
  Administered 2020-02-04: 14:00:00 2 g via INTRAVENOUS
  Filled 2020-02-04: qty 50

## 2020-02-04 MED ORDER — TAMSULOSIN HCL 0.4 MG PO CAPS
0.4000 mg | ORAL_CAPSULE | Freq: Every day | ORAL | Status: DC
  Administered 2020-02-04: 0.4 mg via ORAL
  Filled 2020-02-04: qty 1

## 2020-02-04 MED ORDER — ATORVASTATIN CALCIUM 80 MG PO TABS
80.0000 mg | ORAL_TABLET | Freq: Every day | ORAL | Status: DC
  Administered 2020-02-04 – 2020-02-11 (×8): 80 mg via ORAL
  Filled 2020-02-04 (×3): qty 1
  Filled 2020-02-04: qty 2
  Filled 2020-02-04 (×6): qty 1

## 2020-02-04 MED ORDER — POTASSIUM CHLORIDE CRYS ER 20 MEQ PO TBCR
60.0000 meq | EXTENDED_RELEASE_TABLET | Freq: Once | ORAL | Status: AC
  Administered 2020-02-04: 14:00:00 60 meq via ORAL
  Filled 2020-02-04: qty 3

## 2020-02-04 NOTE — Progress Notes (Signed)
[place] for Heparin (without bolus) Indication: History of Mural Thrombus  No Known Allergies  Patient Measurements: Height: 5\' 10"  (177.8 cm) Weight: 200 lb (90.7 kg) IBW/kg (Calculated) : 73 Heparin Dosing Weight:  91.9 kg  Vital Signs: Temp: 97.8 F (36.6 C) (03/26 1815) Temp Source: Oral (03/26 1815) BP: 90/63 (03/26 1715)  Labs: Recent Labs     0000 02/02/20 1343 02/02/20 1343 02/02/20 1354 02/02/20 1538 02/02/20 1839 02/02/20 1909 02/02/20 2125 02/02/20 2311 02/03/20 0554 02/03/20 0554 02/03/20 1512 02/03/20 1658 02/04/20 0114  HGB   < > 14.3   < > 15.3  15.6  --   --   --   --  12.4*  --   --   --   --  10.7*  HCT  --  45.3   < > 45.0  46.0  --   --   --   --  39.0  --   --   --   --  32.9*  PLT  --  207  --   --   --   --   --   --  184  --   --   --   --  149*  APTT  --   --   --   --   --  38*  --   --  40*  --   --   --   --   --   LABPROT  --   --   --   --   --  21.0*  --   --   --   --   --   --   --   --   INR  --   --   --   --   --  1.8*  --   --   --   --   --   --   --   --   HEPARINUNFRC  --   --   --   --   --   --   --   --   --  0.11*   < > 1.44* 0.10* 0.26*  CREATININE  --  1.76*   < > 1.60*  --   --   --    < > 1.52* 1.27*  --   --   --  1.09  CKTOTAL  --  159  --   --   --   --  151  --   --   --   --   --   --   --   TROPONINIHS  --  36*  --   --  42*  --   --   --   --   --   --   --   --   --    < > = values in this interval not displayed.    Estimated Creatinine Clearance: 80.6 mL/min (by C-G formula based on SCr of 1.09 mg/dL).   Medical History: Past Medical History:  Diagnosis Date  . Barrett's esophagus 04/21/2018  . CAD (coronary artery disease)    a. s/p STEMI in 01/2019 with DES to proximal LAD  . CHF (congestive heart failure) ([place])    a. EF 35-40% by echo in 01/2019, at 25-30% by repeat echo in 05/2019 and 08/2019  . Depression   . High cholesterol   . Hypertension   .  Hypothyroid   . Sleep  apnea    wears CPAP at home   Assessment: 64 yr old male with hx of ischemic cardiomyopathy S/P STEMI and PCI in 01/2019, HFrEF (last EF 25-30% in 08/2019), HTN, HLD, OSA, and depression presented with AMS and potential cardiogenic shock. Pt was previously on Xarelto for mural thrombus, with unknown compliance (not on current med list, but pt told RN he was taking Xarelto as late as yesterday; however, pt is confused). Pharmacy is consulted to dose heparin for history or mural thrombus.   H/H, platelets WNL; admission INR 1.8. Given the uncertainty re: whether pt was taking Xarelto PTA, will order aPTT with first heparin level.  3/27 AM update:  Heparin level low No issues per RN  Goal of Therapy:  Heparin level 0.3-0.7 units/ml Monitor platelets by anticoagulation protocol: Yes   Plan:  Inc heparin to 1900 units/hr 1100 heparin level Monitor daily heparin level, aPTT, CBC Monitor for signs/symptoms of bleeding  Narda Bonds, PharmD, BCPS Clinical Pharmacist Phone: [phone]

## 2020-02-04 NOTE — Progress Notes (Addendum)
Patient has not urinated 6 hours after foley was removed, Bladder scan showed 254 urine retention. encouraged pt to urinate but he was unable to. He stated he wants to wait for couple hours and he will try again. After a while, patient urinated 350 ml.  Will not place foley back on patient at this time.

## 2020-02-04 NOTE — Progress Notes (Signed)
Advanced Heart Failure Rounding Note  PCP-Cardiologist: Shelva Majestic, MD   Subjective:    Echo EF 10% RV mild to moderately down   Now on milrinone 0.375. Co-ox 71% Renal function has normalized.   Off lasix. CVP up to 7-8    Objective:   Weight Range: 92.5 kg Body mass index is 29.27 kg/m.   Vital Signs:   Temp:  [97.7 F (36.5 C)-99.2 F (37.3 C)] 97.7 F (36.5 C) (03/27 1157) Pulse Rate:  [88-98] 88 (03/27 1157) Resp:  [23-35] 24 (03/27 1157) BP: (90-98)/(53-64) 95/53 (03/27 1157) SpO2:  [91 %-100 %] 91 % (03/27 1157) Weight:  [92.5 kg] 92.5 kg (03/27 0400)    Weight change: Filed Weights   02/02/20 2100 02/03/20 0419 02/04/20 0400  Weight: 93.4 kg 90.7 kg 92.5 kg    Intake/Output:   Intake/Output Summary (Last 24 hours) at 02/04/2020 1338 Last data filed at 02/04/2020 1300 Gross per 24 hour  Intake 1723.33 ml  Output 1375 ml  Net 348.33 ml      Physical Exam   General:  Chronically ill appearing. No resp difficulty HEENT: normal Neck: supple. JVP 7-8 . Carotids 2+ bilat; no bruits. No lymphadenopathy or thryomegaly appreciated. Cor: PMI nondisplaced. Regular rate & rhythm. +s3 Lungs: clear Abdomen: soft, nontender, nondistended. No hepatosplenomegaly. No bruits or masses. Good bowel sounds. Extremities: no cyanosis, clubbing, rash, tr edema Neuro: alert & orientedx3, cranial nerves grossly intact. moves all 4 extremities w/o difficulty. Affect pleasant   Telemetry   SR 80s Personally reviewed   EKG    n/a  Labs    CBC Recent Labs    02/02/20 1343 02/02/20 1354 02/02/20 2311 02/04/20 0114  WBC 11.0*   < > 10.1 9.1  NEUTROABS 8.8*  --  8.3*  --   HGB 14.3   < > 12.4* 10.7*  HCT 45.3   < > 39.0 32.9*  MCV 90.1   < > 85.9 86.1  PLT 207   < > 184 149*   < > = values in this interval not displayed.   Basic Metabolic Panel Recent Labs    02/02/20 1839 02/02/20 2125 02/03/20 0554 02/04/20 0114  NA  --    < > 142 136  K  --     < > 3.3* 3.3*  CL  --    < > 107 103  CO2  --    < > 23 24  GLUCOSE  --    < > 96 109*  BUN  --    < > 26* 24*  CREATININE  --    < > 1.27* 1.09  CALCIUM  --    < > 8.2* 8.0*  MG 1.9   < > 2.0 1.7  PHOS 3.5  --   --   --    < > = values in this interval not displayed.   Liver Function Tests Recent Labs    02/02/20 2311 02/03/20 0554  AST 359* 202*  ALT 575* 470*  ALKPHOS 84 72  BILITOT 3.9* 3.5*  PROT 6.2* 5.5*  ALBUMIN 3.5 3.1*   No results for input(s): LIPASE, AMYLASE in the last 72 hours. Cardiac Enzymes Recent Labs    02/02/20 1343 02/02/20 1909  CKTOTAL 159 151    BNP: BNP (last 3 results) Recent Labs    12/02/19 1146 12/14/19 1048 02/02/20 1343  BNP 656.0* 438.6* 2,899.9*    ProBNP (last 3 results) Recent Labs    07/26/19  1102  PROBNP 992*     D-Dimer No results for input(s): DDIMER in the last 72 hours. Hemoglobin A1C Recent Labs    02/02/20 1840  HGBA1C 6.2*   Fasting Lipid Panel No results for input(s): CHOL, HDL, LDLCALC, TRIG, CHOLHDL, LDLDIRECT in the last 72 hours. Thyroid Function Tests Recent Labs    02/02/20 2012  TSH 26.296*    Other results:   Imaging    No results found.   Medications:     Scheduled Medications: . Chlorhexidine Gluconate Cloth  6 each Topical Daily  . clopidogrel  75 mg Oral Daily  . digoxin  0.125 mg Oral Daily  . feeding supplement (GLUCERNA SHAKE)  237 mL Oral TID BM  . insulin aspart  0-6 Units Subcutaneous TID WC  . levothyroxine  125 mcg Oral QAC breakfast  . multivitamin with minerals  1 tablet Oral Daily  . pantoprazole  40 mg Oral Daily  . potassium chloride  40 mEq Oral BID  . sodium chloride flush  10-40 mL Intracatheter Q12H    Infusions: . heparin 1,900 Units/hr (02/04/20 0534)  . milrinone 0.375 mcg/kg/min (02/04/20 1043)    PRN Medications: sodium chloride flush     Assessment/Plan   1. Cardiogenic Shock  - Has ICD  - ECHO this admit EF 10% with RV mild  moderately reduce. In 2020 EF 25-30%  - Lactic Acid trending down 2.9>2.1>1.7  - on milrinone 0.375 co-ox 46% -> 71%. Decrease milrinone to 0.25 - start lasix 40 po daily - Hold beta blocker due to low output state.  - Continue digoxin 0.125 - add spiro 12.5 - BP too low for ARB/ARNI - Shock improved improved with milrinone. I am not overly optimistic that we will be able to transition to an effective oral HF regimen. Thus I have discussed the possibility of VAD placement with him. He said he would be interested in VAD support if he needed it. How he has very limited social support and says he can only rely on his neighbors. We will continue to adjust his HF meds and may initiate the VAD process to see if he is a candidate early next week depending on further discussions with him.    2. CAD s/p STEMI, PCI 01/2019 - Continue plavix + statin.  - no ASA given concomitant anticoagulation.  - stop plavix. Given possibility of VAD  3. History of LV Thrombus - On Xarelto as outpatient.  - On heparin drip for possible invasive hemodynamics this admission. No bleeding  4. AKI  - In the setting of low output HF.   - Creatinine trending down from 1.5>1.2 with addition of milrinone. - Follow BMET   5. Hypothyroidism  - On levothyroxine.  6. AMS - CT of head was negative.  - UDS negative.  - Suspect related to low output HF.    7. Hypokalemia - supp  8. Hypomagnesemia - supp  Length of Stay: 2  Glori Bickers, MD  02/04/2020, 1:38 PM  Advanced Heart Failure Team Pager [phone] (M-F; [place])  Please contact [facility] for night-coverage after hours (4p -7a ) and weekends on [email or web address]

## 2020-02-04 NOTE — Progress Notes (Signed)
[place] for Heparin (without bolus) Indication: History of Mural Thrombus  No Known Allergies  Patient Measurements: Height: 5\' 10"  (177.8 cm) Weight: 204 lb (92.5 kg) IBW/kg (Calculated) : 73 Heparin Dosing Weight:  91.9 kg  Vital Signs: Temp: 97.9 F (36.6 C) (03/27 0813) Temp Source: Oral (03/27 0813) BP: 98/64 (03/27 0813) Pulse Rate: 98 (03/27 0813)  Labs: Recent Labs     0000 02/02/20 1343 02/02/20 1343 02/02/20 1354 02/02/20 1538 02/02/20 1839 02/02/20 1909 02/02/20 2125 02/02/20 2311 02/03/20 0554 02/03/20 1512 02/03/20 1658 02/04/20 0114 02/04/20 1049  HGB   < > 14.3   < > 15.3  15.6  --   --   --   --  12.4*  --   --   --  10.7*  --   HCT  --  45.3   < > 45.0  46.0  --   --   --   --  39.0  --   --   --  32.9*  --   PLT  --  207  --   --   --   --   --   --  184  --   --   --  149*  --   APTT  --   --   --   --   --  38*  --   --  40*  --   --   --   --   --   LABPROT  --   --   --   --   --  21.0*  --   --   --   --   --   --   --   --   INR  --   --   --   --   --  1.8*  --   --   --   --   --   --   --   --   HEPARINUNFRC  --   --   --   --   --   --   --   --   --  0.11*   < > 0.10* 0.26* 0.47  CREATININE  --  1.76*   < > 1.60*  --   --   --    < > 1.52* 1.27*  --   --  1.09  --   CKTOTAL  --  159  --   --   --   --  151  --   --   --   --   --   --   --   TROPONINIHS  --  36*  --   --  42*  --   --   --   --   --   --   --   --   --    < > = values in this interval not displayed.    Estimated Creatinine Clearance: 81.3 mL/min (by C-G formula based on SCr of 1.09 mg/dL).   Medical History: Past Medical History:  Diagnosis Date  . Barrett's esophagus 04/21/2018  . CAD (coronary artery disease)    a. s/p STEMI in 01/2019 with DES to proximal LAD  . CHF (congestive heart failure) ([place])    a. EF 35-40% by echo in 01/2019, at 25-30% by repeat echo in 05/2019 and 08/2019  . Depression   . High cholesterol    . Hypertension   .  Hypothyroid   . Sleep apnea    wears CPAP at home   Assessment: 64 yr old male with hx of ischemic cardiomyopathy S/P STEMI and PCI in 01/2019, HFrEF (last EF 25-30% in 08/2019), HTN, HLD, OSA, and depression presented with AMS and potential cardiogenic shock. Pt was previously on Xarelto for mural thrombus, with unknown compliance (not on current med list, but pt told RN he was taking Xarelto; however, pt is confused). Pharmacy is consulted to dose heparin for history or mural thrombus.   Heparin level this morning is therapeutic after a rate increase overnight to 1900 units/hour from 1750 units/hour. H&H from this morning is low at 10.7/32.9, plts also slightly low at 149.    Goal of Therapy:  Heparin level 0.3-0.7 units/ml Monitor platelets by anticoagulation protocol: Yes   Plan:  -Continue heparin at 1900 units/hr -Confirmatory heparin level in 6 hours and daily HL with CBC  -Monitor for bleeding   Thank you,   Eddie Candle, PharmD PGY-1 Pharmacy Resident   Please check amion for clinical pharmacist contact number

## 2020-02-04 NOTE — Progress Notes (Signed)
[place] for Heparin (without bolus) Indication: History of Mural Thrombus  No Known Allergies  Patient Measurements: Height: 5\' 10"  (177.8 cm) Weight: 204 lb (92.5 kg) IBW/kg (Calculated) : 73 Heparin Dosing Weight:  91.9 kg  Vital Signs: Temp: 97.8 F (36.6 C) (03/27 1730) Temp Source: Axillary (03/27 1730) BP: 118/80 (03/27 1730) Pulse Rate: 88 (03/27 1157)  Labs: Recent Labs     0000 02/02/20 1343 02/02/20 1343 02/02/20 1354 02/02/20 1538 02/02/20 1839 02/02/20 1909 02/02/20 2125 02/02/20 2311 02/03/20 0554 02/03/20 1512 02/04/20 0114 02/04/20 1049 02/04/20 1651  HGB   < > 14.3   < > 15.3  15.6  --   --   --   --  12.4*  --   --  10.7*  --   --   HCT  --  45.3   < > 45.0  46.0  --   --   --   --  39.0  --   --  32.9*  --   --   PLT  --  207  --   --   --   --   --   --  184  --   --  149*  --   --   APTT  --   --   --   --   --  38*  --   --  40*  --   --   --   --   --   LABPROT  --   --   --   --   --  21.0*  --   --   --   --   --   --   --   --   INR  --   --   --   --   --  1.8*  --   --   --   --   --   --   --   --   HEPARINUNFRC  --   --   --   --   --   --   --   --   --  0.11*   < > 0.26* 0.47 0.54  CREATININE  --  1.76*   < > 1.60*  --   --   --    < > 1.52* 1.27*  --  1.09  --   --   CKTOTAL  --  159  --   --   --   --  151  --   --   --   --   --   --   --   TROPONINIHS  --  36*  --   --  42*  --   --   --   --   --   --   --   --   --    < > = values in this interval not displayed.    Estimated Creatinine Clearance: 81.3 mL/min (by C-G formula based on SCr of 1.09 mg/dL).   Medical History: Past Medical History:  Diagnosis Date  . Barrett's esophagus 04/21/2018  . CAD (coronary artery disease)    a. s/p STEMI in 01/2019 with DES to proximal LAD  . CHF (congestive heart failure) ([place])    a. EF 35-40% by echo in 01/2019, at 25-30% by repeat echo in 05/2019 and 08/2019  . Depression   . High  cholesterol   . Hypertension   .  Hypothyroid   . Sleep apnea    wears CPAP at home   Assessment: 64 yr old male with hx of ischemic cardiomyopathy S/P STEMI and PCI in 01/2019, HFrEF (last EF 25-30% in 08/2019), HTN, HLD, OSA, and depression presented with AMS and potential cardiogenic shock. Pt was previously on Xarelto for mural thrombus, with unknown compliance (not on current med list, but pt told RN he was taking Xarelto; however, pt is confused). Pharmacy is consulted to dose heparin for history or mural thrombus.   Heparin level this morning is therapeutic after a rate increase overnight to 1900 units/hour from 1750 units/hour. H&H from this morning is low at 10.7/32.9, plts also slightly low at 149.   PM f/u > heparin level remains at goal this evening.   Goal of Therapy:  Heparin level 0.3-0.7 units/ml Monitor platelets by anticoagulation protocol: Yes   Plan:  -Continue heparin at 1900 units/hr -Daily heparin level and CBC. -Monitor for bleeding   Thank you,   Marguerite Olea, [facility] Clinical Pharmacist Phone [phone]  02/04/2020 6:21 PM

## 2020-02-04 NOTE — Progress Notes (Signed)
   Subjective:  Much more alert today. Orientedx3. He states he kind of remembers what happened at home, he just began to feel like he couldn't do anything anymore both physically and mentally. His wife passed away from an aneurysm about a year ago and he has been sad since then. He denies SI or the feeling of wanting to give up.   He states he has been having trouble urinating lately. He sometimes has the sensation of still needing to urinate when he is done and was waking up every 4 hours to urinate throughout the night.   Objective:  Vital signs in last 24 hours: Vitals:   02/03/20 1815 02/03/20 2000 02/04/20 0000 02/04/20 0400  BP:  (!) 92/56 (!) 92/57 94/63  Pulse:      Resp:  (!) 35 (!) 32 (!) 27  Temp: 97.8 F (36.6 C) 99.2 F (37.3 C) 98.6 F (37 C) 98.3 F (36.8 C)  TempSrc: Oral Oral Oral   SpO2:  100% 100% 100%  Weight:    92.5 kg  Height:        Constitution: NAD, chronically ill appearing HENT: [place] in place, AT/[place] Cardio: RRR, no m/r/g, trace edema, no JVD Respiratory: +crackles in the bases bilaterally Abdominal: NTTP, soft, non-distended, +BS Neuro: normal affect, a&ox3 GU: foley catheter in place Skin: c/d/i   Assessment/Plan:  Principal Problem:   Cardiogenic shock (HCC) Active Problems:   Acute exacerbation of CHF (congestive heart failure) (HCC)   Acute encephalopathy   64 year old male with past medical history of ischemic cardiomyopathy status post STEMI and PCI in March 2020, HFrEF, hypertension, hyperlipidemia, OSA, and depression who presented with altered mental status, LE edema and elevated BNP.   AMS MDD AMS resolved. Likely secondary to cardiogenic shock as this has improved drastically with improvement in hemodynamic status. He is not completely sure which medications he is taking. He has not required benzodiazepines and is low risk for withdrawal.  - hold klonipin for now - hold wellbutrin w/hepatic impairment - hold other centrally  acting medications for now - medications are from external pharmacy, inpatient pharm unable to review  Cardiogenic Shock  Acute HFrEF Exacerbation - EF 15-20% Co-ox 70.9 today from 49 yesterday after starting milrinone and lasix gtt. Renal function has normalized. Lasix gtt to be stopped yesterday evening but per review continued to around 6am. HF discussing possible VAD later this week.  - cont. Dig .125 - spiro 12.5 mg started today  - cont. Milrinone per HF, decreased from .375 to .25 today  - lasix 40 po today  - holding b-blocker in setting of acute HF - keep mg >2, K > 4  History of LV Thrombus Takes xarelto outpatient  - heparin gtt   CAD s/p STEMI, PCI 01/2019 w LAD stent - plavix stopped today for possible VAD - cont. Statin   BPH Symptoms consistent with BPH. Foley catheter placed in the ED. Foley in place per HF to record strict I/O's. May need flomax.   AKI Resolved with improvement in hemodynamic status s/p milrinone  Transaminitis Improving yesterday after starting milrinone with improvement in hemodynamics. Repeat CMP tomorrow.   VTE: heparin IVF: none Diet:  Code:  Dispo: Anticipated discharge pending clinical improvement.   Marty Heck, DO 02/04/2020, 6:17 AM Pager: [phone]

## 2020-02-05 ENCOUNTER — Inpatient Hospital Stay: Payer: Self-pay

## 2020-02-05 DIAGNOSIS — D649 Anemia, unspecified: Secondary | ICD-10-CM

## 2020-02-05 DIAGNOSIS — D696 Thrombocytopenia, unspecified: Secondary | ICD-10-CM

## 2020-02-05 LAB — COMPREHENSIVE METABOLIC PANEL
ALT: 220 U/L — ABNORMAL HIGH (ref 0–44)
AST: 53 U/L — ABNORMAL HIGH (ref 15–41)
Albumin: 2.7 g/dL — ABNORMAL LOW (ref 3.5–5.0)
Alkaline Phosphatase: 71 U/L (ref 38–126)
Anion gap: 7 (ref 5–15)
BUN: 20 mg/dL (ref 8–23)
CO2: 21 mmol/L — ABNORMAL LOW (ref 22–32)
Calcium: 8 mg/dL — ABNORMAL LOW (ref 8.9–10.3)
Chloride: 108 mmol/L (ref 98–111)
Creatinine, Ser: 0.8 mg/dL (ref 0.61–1.24)
GFR calc Af Amer: >60 mL/min (ref >60)
GFR calc non Af Amer: >60 mL/min (ref >60)
Glucose, Bld: 94 mg/dL (ref 70–99)
Potassium: 4 mmol/L (ref 3.5–5.1)
Sodium: 136 mmol/L (ref 135–145)
Total Bilirubin: 1.5 mg/dL — ABNORMAL HIGH (ref 0.3–1.2)
Total Protein: 5.2 g/dL — ABNORMAL LOW (ref 6.5–8.1)

## 2020-02-05 LAB — GLUCOSE, CAPILLARY
Glucose-Capillary: 100 mg/dL — ABNORMAL HIGH (ref 70–99)
Glucose-Capillary: 111 mg/dL — ABNORMAL HIGH (ref 70–99)
Glucose-Capillary: 129 mg/dL — ABNORMAL HIGH (ref 70–99)
Glucose-Capillary: 108 mg/dL — ABNORMAL HIGH (ref 70–99)

## 2020-02-05 LAB — HEPARIN LEVEL (UNFRACTIONATED): Heparin Unfractionated: 0.36 IU/mL (ref 0.30–0.70)

## 2020-02-05 LAB — CBC
HCT: 35.3 % — ABNORMAL LOW (ref 39.0–52.0)
Hemoglobin: 11 g/dL — ABNORMAL LOW (ref 13.0–17.0)
MCH: 27.4 pg (ref 26.0–34.0)
MCHC: 31.2 g/dL (ref 30.0–36.0)
MCV: 88 fL (ref 80.0–100.0)
Platelets: 142 10*3/uL — ABNORMAL LOW (ref 150–400)
RBC: 4.01 MIL/uL — ABNORMAL LOW (ref 4.22–5.81)
RDW: 17.1 % — ABNORMAL HIGH (ref 11.5–15.5)
WBC: 9.1 10*3/uL (ref 4.0–10.5)
nRBC: 0.4 % — ABNORMAL HIGH (ref 0.0–0.2)

## 2020-02-05 LAB — COOXEMETRY PANEL
Carboxyhemoglobin: 1.4 % (ref 0.5–1.5)
Methemoglobin: 1.1 % (ref 0.0–1.5)
O2 Saturation: 54.1 %
Total hemoglobin: 11.3 g/dL — ABNORMAL LOW (ref 12.0–16.0)

## 2020-02-05 LAB — MAGNESIUM: Magnesium: 1.8 mg/dL (ref 1.7–2.4)

## 2020-02-05 MED ORDER — MAGNESIUM SULFATE 2 GM/50ML IV SOLN
2.0000 g | Freq: Once | INTRAVENOUS | Status: AC
  Administered 2020-02-05: 2 g via INTRAVENOUS
  Filled 2020-02-05: qty 50

## 2020-02-05 MED ORDER — MAGNESIUM SULFATE 4 GM/100ML IV SOLN
4.0000 g | Freq: Once | INTRAVENOUS | Status: AC
  Administered 2020-02-05: 4 g via INTRAVENOUS
  Filled 2020-02-05: qty 100

## 2020-02-05 NOTE — Progress Notes (Signed)
   Subjective:   Feeling ok this morning. He is having some SOB although unchanged since admission, denies dizziness. Urinating well. Discussed possible VAD procedure.   Objective:  Vital signs in last 24 hours: Vitals:   02/05/20 0200 02/05/20 0300 02/05/20 0455 02/05/20 0532  BP: 105/72  102/74 94/64  Pulse:   95 92  Resp: (!) 33 (!) 25 20   Temp:   97.6 F (36.4 C)   TempSrc:   Oral   SpO2: 100%  93%   Weight:      Height:       Constitution: NAD, chronically ill appearing Cardio: RRR, no m/r/g, trace LE edema Respiratory: CTA, no w/r/r Neuro: normal affect, a&ox3 Skin: c/d/i   Assessment/Plan:  Principal Problem:   Cardiogenic shock (HCC) Active Problems:   Acute exacerbation of CHF (congestive heart failure) (HCC)   Acute encephalopathy   64yo male with PMH medical history of ischemic cardiomyopathy status post STEMI and PCI in March 2020, HFrEF, hypertension, hyperlipidemia, OSA, and depression who presented with altered mental status, LE edema, and elevated BNP.    Cardiogenic Shock Acute HFrEF Exacerbation - EF 15-20% Milrinone decreased yesterday from .375 to .25. Co-ox 70.9 > 54 today. Renal function and LFTs continue to improve. Given lasix 40 mg po yesterday with 1L off, net +220cc's. Overall net -2.3L. Mild edema on exam, diuresis limited by soft pressures. Weights do no appear accurate as fluid status did not move much from yesterday to today: 200 > 204 > 213 today.  - cont. Dig .125 - spiro 12.5 mg started yesterday - cont. Milrinone per HF, currently .25 mcg/kg/min - lasix 40 po qd - may need to increase if bp tolerates - holding b-blocker in setting of acute HF - keep mg >2, K > 4 - strict I/O, need daily weights  AMS MDD AMS resolved. Holding wellbutrin with hepatic impairment. Hold other centrally acting medications.   Hx of LV Thrombus Takes xarelto outpatient.  - cont. heparin gtt in case of cardiac intervention.   Normocytic Anemia    Thrombocytopenia Thrombocytopenia possibly in the setting of acute illness. For anemia will check Ferritin, IBC, B12, MMA  CAD s/p STEMI, PCI 01/2019 w LAD stent - plavix stopped today for possible VAD - cont. Atorva  BPH Symptoms consistent with BPH. Foley catheter placed in the ED. Foley in place per HF to record strict I/O's. May need flomax.   Transaminitis Continuing to improve 2/2 HF  VTE: heparin IVF: none Code: full   Dispo: Anticipated discharge pending clinical improvement.   Marty Heck, DO 02/05/2020, 6:19 AM Pager: [phone]

## 2020-02-05 NOTE — Progress Notes (Signed)
Pt's picc line was pulled out by pt about couple hrs ago, MD was notified, will continue to monitor, Thanks Arvella Nigh RN.

## 2020-02-05 NOTE — Progress Notes (Signed)
Advanced Heart Failure Rounding Note  PCP-Cardiologist: Shelva Majestic, MD   Subjective:    Echo EF 10% RV mild to moderately down   Milrinone decreased from 0.375 to 0.25 yesterday.  Co-ox 71% -> 54% Renal function has normalized.   On po lasix. CVP stable at 5   Feels ok. Able to ambulate room on milrinone. Minimal DOE. No orthopnea or PND. No CP.    Objective:   Weight Range: 96.8 kg Body mass index is 30.61 kg/m.   Vital Signs:   Temp:  [97.6 F (36.4 C)-98.2 F (36.8 C)] 97.6 F (36.4 C) (03/28 0455) Pulse Rate:  [86-95] 92 (03/28 0532) Resp:  [14-36] 20 (03/28 1300) BP: (91-118)/(64-80) 113/72 (03/28 1500) SpO2:  [90 %-100 %] 99 % (03/28 1100) Weight:  [96.8 kg] 96.8 kg (03/28 0109) Last BM Date: 02/04/20  Weight change: Filed Weights   02/03/20 0419 02/04/20 0400 02/05/20 0109  Weight: 90.7 kg 92.5 kg 96.8 kg    Intake/Output:   Intake/Output Summary (Last 24 hours) at 02/05/2020 1556 Last data filed at 02/05/2020 1441 Gross per 24 hour  Intake 1104.89 ml  Output 602 ml  Net 502.89 ml      Physical Exam   General:  Weak appearing. No resp difficulty HEENT: normal Neck: supple. no JVD. Carotids 2+ bilat; no bruits. No lymphadenopathy or thryomegaly appreciated. Cor: PMI nondisplaced. Regular rate & rhythm. +s3 Lungs: clear Abdomen: soft, nontender, nondistended. No hepatosplenomegaly. No bruits or masses. Good bowel sounds. Extremities: no cyanosis, clubbing, rash, edema Neuro: alert & orientedx3, cranial nerves grossly intact. moves all 4 extremities w/o difficulty. Affect pleasant    Telemetry   SR 80-90s Personally reviewed   Labs    CBC Recent Labs    02/02/20 2311 02/02/20 2311 02/04/20 0114 02/05/20 0603  WBC 10.1   < > 9.1 9.1  NEUTROABS 8.3*  --   --   --   HGB 12.4*   < > 10.7* 11.0*  HCT 39.0   < > 32.9* 35.3*  MCV 85.9   < > 86.1 88.0  PLT 184   < > 149* 142*   < > = values in this interval not displayed.   Basic  Metabolic Panel Recent Labs    02/02/20 1839 02/02/20 2125 02/04/20 0114 02/05/20 0603  NA  --    < > 136 136  K  --    < > 3.3* 4.0  CL  --    < > 103 108  CO2  --    < > 24 21*  GLUCOSE  --    < > 109* 94  BUN  --    < > 24* 20  CREATININE  --    < > 1.09 0.80  CALCIUM  --    < > 8.0* 8.0*  MG 1.9   < > 1.7 1.8  PHOS 3.5  --   --   --    < > = values in this interval not displayed.   Liver Function Tests Recent Labs    02/03/20 0554 02/05/20 0603  AST 202* 53*  ALT 470* 220*  ALKPHOS 72 71  BILITOT 3.5* 1.5*  PROT 5.5* 5.2*  ALBUMIN 3.1* 2.7*   No results for input(s): LIPASE, AMYLASE in the last 72 hours. Cardiac Enzymes Recent Labs    02/02/20 1909  CKTOTAL 151    BNP: BNP (last 3 results) Recent Labs    12/02/19 1146 12/14/19 1048 02/02/20 1343  BNP 656.0* 438.6* 2,899.9*    ProBNP (last 3 results) Recent Labs    07/26/19 1102  PROBNP 992*     D-Dimer No results for input(s): DDIMER in the last 72 hours. Hemoglobin A1C Recent Labs    02/02/20 1840  HGBA1C 6.2*   Fasting Lipid Panel No results for input(s): CHOL, HDL, LDLCALC, TRIG, CHOLHDL, LDLDIRECT in the last 72 hours. Thyroid Function Tests Recent Labs    02/02/20 2012  TSH 26.296*    Other results:   Imaging    No results found.   Medications:     Scheduled Medications: . atorvastatin  80 mg Oral q1800  . Chlorhexidine Gluconate Cloth  6 each Topical Daily  . digoxin  0.125 mg Oral Daily  . feeding supplement (GLUCERNA SHAKE)  237 mL Oral TID BM  . folic acid  1 mg Oral Daily  . furosemide  40 mg Oral Daily  . insulin aspart  0-6 Units Subcutaneous TID WC  . levothyroxine  125 mcg Oral QAC breakfast  . multivitamin with minerals  1 tablet Oral Daily  . pantoprazole  40 mg Oral Daily  . potassium chloride  40 mEq Oral BID  . sodium chloride flush  10-40 mL Intracatheter Q12H  . spironolactone  12.5 mg Oral Daily    Infusions: . heparin 1,900 Units/hr  (02/05/20 0836)  . milrinone 0.25 mcg/kg/min (02/05/20 1441)    PRN Medications: albuterol, sodium chloride flush     Assessment/Plan   1. Cardiogenic Shock  - Has ICD  - ECHO this admit EF 10% with RV mild moderately reduce. In 2020 EF 25-30%  - Lactic Acid trending down 2.9>2.1>1.7  - Milrinone decreased from 0.375 -> 0.25  co-ox 71% -> 54%. Appears inotrope dependent.  - CVP 5. Continue lasix 40 po daily - Hold beta blocker due to low output state.  - Continue digoxin 0.125 - Continue spiro 12.5 - BP too low for ARB/ARNI - Shock improved improved with milrinone. He is failing milrinone wean and appears inotrope dependent. Thus I have discussed the possibility of VAD placement with him. He said he would be interested in VAD support if he needed it. How he has very limited social support and says he can only rely on his neighbors. We will continue to adjust his HF meds and may initiate the VAD process to see if he is a candidate early next week depending on further discussions with him. - VAD eval to begin tomorrow   2. CAD s/p STEMI, PCI 01/2019 - Continue plavix + statin.  - no ASA given concomitant anticoagulation.  - plavix stopped 3/28. Given possibility of VAD  3. History of LV Thrombus - On Xarelto as outpatient.  - On heparin drip for possible invasive hemodynamics this admission. No bleeding  4. AKI  - In the setting of low output HF.   - Creatinine trending down from 1.5>1.2 -> 0.80  with addition of milrinone. - Follow BMET   5. Hypothyroidism  - On levothyroxine.  6. AMS - CT of head was negative.  - UDS negative.  - Suspect related to low output HF.    7. Hypokalemia - 4.0 today  8. Hypomagnesemia - 1.8. will supp   Length of Stay: 3  Glori Bickers, MD  02/05/2020, 3:56 PM  Advanced Heart Failure Team Pager [phone] (M-F; 7a - 4p)  Please contact [facility] for night-coverage after hours (4p -7a ) and weekends on [email or web address]

## 2020-02-05 NOTE — Progress Notes (Signed)
[place] for Heparin (without bolus) Indication: History of Mural Thrombus  No Known Allergies  Patient Measurements: Height: 5\' 10"  (177.8 cm) Weight: 213 lb 4.8 oz (96.8 kg) IBW/kg (Calculated) : 73 Heparin Dosing Weight:  91.9 kg  Vital Signs: Temp: 97.6 F (36.4 C) (03/28 0455) Temp Source: Oral (03/28 0455) BP: 99/66 (03/28 0700) Pulse Rate: 92 (03/28 0532)  Labs: Recent Labs     0000 02/02/20 1343 02/02/20 1354 02/02/20 1538 02/02/20 1839 02/02/20 1909 02/02/20 2125 02/02/20 2311 02/02/20 2311 02/03/20 0554 02/03/20 1512 02/04/20 0114 02/04/20 0114 02/04/20 1049 02/04/20 1651 02/05/20 0603  HGB   < > 14.3   < >  --   --   --   --  12.4*   < >  --   --  10.7*  --   --   --  11.0*  HCT   < > 45.3   < >  --   --   --   --  39.0  --   --   --  32.9*  --   --   --  35.3*  PLT   < > 207  --   --   --   --   --  184  --   --   --  149*  --   --   --  142*  APTT  --   --   --   --  38*  --   --  40*  --   --   --   --   --   --   --   --   LABPROT  --   --   --   --  21.0*  --   --   --   --   --   --   --   --   --   --   --   INR  --   --   --   --  1.8*  --   --   --   --   --   --   --   --   --   --   --   HEPARINUNFRC  --   --   --   --   --   --   --   --   --  0.11*   < > 0.26*   < > 0.47 0.54 0.36  CREATININE  --  1.76*   < >  --   --   --    < > 1.52*   < > 1.27*  --  1.09  --   --   --  0.80  CKTOTAL  --  159  --   --   --  151  --   --   --   --   --   --   --   --   --   --   TROPONINIHS  --  36*  --  42*  --   --   --   --   --   --   --   --   --   --   --   --    < > = values in this interval not displayed.    Estimated Creatinine Clearance: 113.2 mL/min (by C-G formula based on SCr of 0.8 mg/dL).   Medical History: Past Medical History:  Diagnosis Date  . Barrett's  esophagus 04/21/2018  . CAD (coronary artery disease)    a. s/p STEMI in 01/2019 with DES to proximal LAD  . CHF (congestive heart failure)  ([place])    a. EF 35-40% by echo in 01/2019, at 25-30% by repeat echo in 05/2019 and 08/2019  . Depression   . High cholesterol   . Hypertension   . Hypothyroid   . Sleep apnea    wears CPAP at home   Assessment: 64 yr old male with hx of ischemic cardiomyopathy S/P STEMI and PCI in 01/2019, HFrEF (last EF 25-30% in 08/2019), HTN, HLD, OSA, and depression presented with AMS and potential cardiogenic shock. Pt was previously on Xarelto for mural thrombus, with unknown compliance (not on current med list, but pt told RN he was taking Xarelto; however, pt is confused). Pharmacy is consulted to dose heparin for history of mural thrombus.   Heparin level this morning remains therapeutic at 0.36 on 1900 units/hour. H&H from this morning is stable at 11.0/35.3, plts trending down at 142 today.     Goal of Therapy:  Heparin level 0.3-0.7 units/ml Monitor platelets by anticoagulation protocol: Yes   Plan:  -Continue heparin at 1900 units/hr -Daily HL with CBC  -Monitor for bleeding   Thank you,   Eddie Candle, PharmD PGY-1 Pharmacy Resident   Please check amion for clinical pharmacist contact number

## 2020-02-06 DIAGNOSIS — G9340 Encephalopathy, unspecified: Secondary | ICD-10-CM

## 2020-02-06 LAB — BLOOD GAS, ARTERIAL
Acid-base deficit: 3 mmol/L — ABNORMAL HIGH (ref 0.0–2.0)
Bicarbonate: 20.7 mmol/L (ref 20.0–28.0)
Drawn by: 535271
FIO2: 32
O2 Saturation: 97.1 %
Patient temperature: 36.8
pCO2 arterial: 32.2 mmHg (ref 32.0–48.0)
pH, Arterial: 7.424 (ref 7.350–7.450)
pO2, Arterial: 95.5 mmHg (ref 83.0–108.0)

## 2020-02-06 LAB — CBC
HCT: 33.2 % — ABNORMAL LOW (ref 39.0–52.0)
Hemoglobin: 10.5 g/dL — ABNORMAL LOW (ref 13.0–17.0)
MCH: 27.4 pg (ref 26.0–34.0)
MCHC: 31.6 g/dL (ref 30.0–36.0)
MCV: 86.7 fL (ref 80.0–100.0)
Platelets: 176 10*3/uL (ref 150–400)
RBC: 3.83 MIL/uL — ABNORMAL LOW (ref 4.22–5.81)
RDW: 17.2 % — ABNORMAL HIGH (ref 11.5–15.5)
WBC: 9.3 10*3/uL (ref 4.0–10.5)
nRBC: 0.4 % — ABNORMAL HIGH (ref 0.0–0.2)

## 2020-02-06 LAB — HEPATIC FUNCTION PANEL
ALT: 176 U/L — ABNORMAL HIGH (ref 0–44)
AST: 42 U/L — ABNORMAL HIGH (ref 15–41)
Albumin: 2.9 g/dL — ABNORMAL LOW (ref 3.5–5.0)
Alkaline Phosphatase: 70 U/L (ref 38–126)
Bilirubin, Direct: 0.4 mg/dL — ABNORMAL HIGH (ref 0.0–0.2)
Indirect Bilirubin: 1 mg/dL — ABNORMAL HIGH (ref 0.3–0.9)
Total Bilirubin: 1.4 mg/dL — ABNORMAL HIGH (ref 0.3–1.2)
Total Protein: 5.4 g/dL — ABNORMAL LOW (ref 6.5–8.1)

## 2020-02-06 LAB — COOXEMETRY PANEL
Carboxyhemoglobin: 1.1 % (ref 0.5–1.5)
Methemoglobin: 1 % (ref 0.0–1.5)
O2 Saturation: 49.9 %
Total hemoglobin: 11.9 g/dL — ABNORMAL LOW (ref 12.0–16.0)

## 2020-02-06 LAB — BASIC METABOLIC PANEL
Anion gap: 10 (ref 5–15)
BUN: 19 mg/dL (ref 8–23)
CO2: 23 mmol/L (ref 22–32)
Calcium: 8.4 mg/dL — ABNORMAL LOW (ref 8.9–10.3)
Chloride: 104 mmol/L (ref 98–111)
Creatinine, Ser: 1.09 mg/dL (ref 0.61–1.24)
GFR calc Af Amer: >60 mL/min (ref >60)
GFR calc non Af Amer: >60 mL/min (ref >60)
Glucose, Bld: 122 mg/dL — ABNORMAL HIGH (ref 70–99)
Potassium: 4.5 mmol/L (ref 3.5–5.1)
Sodium: 137 mmol/L (ref 135–145)

## 2020-02-06 LAB — GLUCOSE, CAPILLARY
Glucose-Capillary: 117 mg/dL — ABNORMAL HIGH (ref 70–99)
Glucose-Capillary: 108 mg/dL — ABNORMAL HIGH (ref 70–99)
Glucose-Capillary: 104 mg/dL — ABNORMAL HIGH (ref 70–99)
Glucose-Capillary: 93 mg/dL (ref 70–99)

## 2020-02-06 LAB — HEPARIN LEVEL (UNFRACTIONATED)
Heparin Unfractionated: 0.2 IU/mL — ABNORMAL LOW (ref 0.30–0.70)
Heparin Unfractionated: 0.29 IU/mL — ABNORMAL LOW (ref 0.30–0.70)

## 2020-02-06 LAB — MAGNESIUM: Magnesium: 2.1 mg/dL (ref 1.7–2.4)

## 2020-02-06 MED ORDER — CLONAZEPAM 0.5 MG PO TABS
1.0000 mg | ORAL_TABLET | Freq: Every day | ORAL | Status: DC
  Administered 2020-02-06 – 2020-02-07 (×2): 1 mg via ORAL
  Filled 2020-02-06 (×2): qty 2

## 2020-02-06 MED ORDER — ARIPIPRAZOLE 5 MG PO TABS
10.0000 mg | ORAL_TABLET | Freq: Every day | ORAL | Status: DC
  Administered 2020-02-06 – 2020-02-14 (×9): 10 mg via ORAL
  Filled 2020-02-06 (×9): qty 2

## 2020-02-06 NOTE — Progress Notes (Signed)
Peripherally Inserted Central Catheter Placement  The IV Nurse has discussed with the patient and/or persons authorized to consent for the patient, the purpose of this procedure and the potential benefits and risks involved with this procedure.  The benefits include less needle sticks, lab draws from the catheter, and the patient may be discharged home with the catheter. Risks include, but not limited to, infection, bleeding, blood clot (thrombus formation), and puncture of an artery; nerve damage and irregular heartbeat and possibility to perform a PICC exchange if needed/ordered by physician.  Alternatives to this procedure were also discussed.  [facility] PICC patient education guide, fact sheet on infection prevention and patient information card has been provided to patient /or left at bedside.    PICC Placement Documentation  PICC Double Lumen [id number] PICC Right Basilic 45 cm 0 cm (Active)  Indication for Insertion or Continuance of Line Vasoactive infusions 02/06/20 1500  Exposed Catheter (cm) 0 cm 02/06/20 1500  Site Assessment Clean;Dry;Intact 02/06/20 1500  Lumen #1 Status Flushed;Saline locked;Blood return noted 02/06/20 1500  Lumen #2 Status Flushed;Saline locked;Blood return noted 02/06/20 1500  Dressing Type Transparent;Securing device 02/06/20 1500  Dressing Status Clean;Dry;Intact;Antimicrobial disc in place 02/06/20 1500  Dressing Intervention New dressing 02/06/20 1500  Dressing Change Due 02/13/20 02/06/20 1500       Enos Fling 02/06/2020, 3:26 PM

## 2020-02-06 NOTE — Progress Notes (Signed)
Pt has pulled mult. IVs and a PICC

## 2020-02-06 NOTE — Progress Notes (Signed)
In to meet with patient and begin VAD evaluation per Dr. Clayborne Dana instructions.  Pt confused, unable to identify location or time; sitter at bedside.   Will defer VAD education/consent at this time.  Zada Girt RN, [place] Coordinator [phone]

## 2020-02-06 NOTE — Progress Notes (Signed)
Pt pulled out his 2 IV's that were put in tonight for cont Milrinone drip, earlier today pt pulled out his picc line, pt is here for cardiogenic shock which he has improved on, pt was having hallucinations and forgetting he was in the hospital, MD was notified and came up to see him.  Pt's nuero were all WNL other than his orientation to place, time, and situation, pt will need another picc line for CVP monitoring, will continue to monitor, Thanks Arvella Nigh RN.

## 2020-02-06 NOTE — Progress Notes (Signed)
Received page at 203 by bedside nurse for confusion, and patient ripping out both IVs and PICC line. IMTS staff went to bedside to assess patient. Alert to self, not place or time. Patient endorses seeing a clock face at the end of the bed and hearing voices in the outside hall.  Patient was easily directed. IV team had placed enough IV.  Physical examination:  Neurological examination: Strength intact 5/5 in all extremities, CN intact.  Cardiac: RRR, no murmurs, rubs gallops Lungs: CTAB, no crackles/wheezes  Plan:  - Soft mittens - Continue to monitor vitals

## 2020-02-06 NOTE — Progress Notes (Addendum)
[place] for Heparin (without bolus) Indication: History of Mural Thrombus  No Known Allergies  Patient Measurements: Height: 5\' 10"  (177.8 cm) Weight: 214 lb 3.2 oz (97.2 kg) IBW/kg (Calculated) : 73 Heparin Dosing Weight:  91.9 kg  Vital Signs: Temp: 98.2 F (36.8 C) (03/29 0755) Temp Source: Oral (03/29 0755) BP: 93/66 (03/29 0853) Pulse Rate: 104 (03/29 0755)  Labs: Recent Labs    02/04/20 0114 02/04/20 0114 02/04/20 1049 02/04/20 1651 02/05/20 0603 02/06/20 0440  HGB 10.7*   < >  --   --  11.0* 10.5*  HCT 32.9*  --   --   --  35.3* 33.2*  PLT 149*  --   --   --  142* 176  HEPARINUNFRC 0.26*  --    < > 0.54 0.36 0.20*  CREATININE 1.09  --   --   --  0.80 1.09   < > = values in this interval not displayed.    Estimated Creatinine Clearance: 83.2 mL/min (by C-G formula based on SCr of 1.09 mg/dL).   Medical History: Past Medical History:  Diagnosis Date  . Barrett's esophagus 04/21/2018  . CAD (coronary artery disease)    a. s/p STEMI in 01/2019 with DES to proximal LAD  . CHF (congestive heart failure) ([place])    a. EF 35-40% by echo in 01/2019, at 25-30% by repeat echo in 05/2019 and 08/2019  . Depression   . High cholesterol   . Hypertension   . Hypothyroid   . Sleep apnea    wears CPAP at home   Assessment: 64 yr old male with hx of ischemic cardiomyopathy S/P STEMI and PCI in 01/2019, HFrEF (last EF 25-30% in 08/2019), HTN, HLD, OSA, and depression presented with AMS and potential cardiogenic shock. Pt was previously on Xarelto for mural thrombus, with unknown compliance (not on current med list, but pt told RN he was taking Xarelto; however, pt is confused). Pharmacy is consulted to dose heparin for history of mural thrombus.   Heparin level this morning came back subtherapeutic at 0.2, on 1900 units/hr. Hgb stable at 10.5, plt 176. No s/sx of bleeding. PICC line removed by patient but minimal delay in heparin  stopping (~30 minutes).   Goal of Therapy:  Heparin level 0.3-0.7 units/ml Monitor platelets by anticoagulation protocol: Yes   Plan:  -Increase heparin at 2000 units/hr -Get level in 6 hours -Daily HL with CBC  -Monitor for bleeding   Thank you,   Antonietta Jewel, PharmD, BCCCP Clinical Pharmacist  Phone: [phone]  Please check AMION for all [facility] phone numbers After 10:00 PM, call [facility] [phone]  ADDENDUM Heparin level came back slightly subtherapeutic at 0.29, on 2000 units/hr. No s/sx of bleeding or infusion issues. Increase to 2100 units/hr to get into goal range and get level with AM labs.  Antonietta Jewel, PharmD, [place] Clinical Pharmacist

## 2020-02-06 NOTE — Consult Note (Signed)
[facility] Consult   Reason for Consult:  Hallucinations Referring Physician:  Dr Sharon Seller Patient Identification: Tony Alvarez MRN:  [id number] Principal Diagnosis: Cardiogenic shock [facility]) Diagnosis:  Principal Problem:   Cardiogenic shock ([place]) Active Problems:   Acute exacerbation of CHF (congestive heart failure) ([place])   Acute encephalopathy  Total Time spent with patient: 1 hour  Subjective:   Tony Alvarez is a 64 y.o. male patient admitted with cardiac issues, consult for hallucinations.  Patient seen and evaluated in person by this provider.  He was sitting on the bed in a disheveled state with tremors and sweats.  Consult placed for hallucinations but patient denies seeing or hearing things that are not there, does not appear to be responding to internal stimuli.  Sitter is at his bedside and does not indicate any difference in history discrepancy.  He does say that he cannot see very well as he does not have his glasses.  Prior to admission he was taking Klonopin 1 mg daily and has been for the past 6 months according to the PDMP.  Concerns for benzodiazepine withdrawal and Klonopin restarted.  Sleep is "fairly stable" and appetite is "fair."  No suicidal/homicidal ideations.  Recommend client be tapered off of the Klonopin by his outpatient provider and started on an SSRI for better long-term management.  HPI per MD:  This is a 64 year old male with a past medical history of ischemic cardiomyopathy hypertension hyperlipidemia and OSA as well as depression who presented acutely encephalopathic.  Was found to have borderline blood pressure, elevations in transaminases, serum creatinine.  Chest x-ray revealed pulmonary edema.  He was specked to have cardiogenic shock, we consulted cardiology and advanced heart failure he was placed on milrinone, lasix drip and heparin for his history of mural thrombus. This morning a PICC line has been placed for COox and CVP  measurements. He is able to interact with [place] today, he is delayed in his responses to questons, does not remember the events of the past few days and denies any questions.     Past Psychiatric History: mood disorder, unspecified; alcohol use d/o  Risk to Self:  none Risk to Others:  none Prior Inpatient Therapy:  denies Prior Outpatient Therapy:  denies  Past Medical History:  Past Medical History:  Diagnosis Date  . Barrett's esophagus 04/21/2018  . CAD (coronary artery disease)    a. s/p STEMI in 01/2019 with DES to proximal LAD  . CHF (congestive heart failure) ([place])    a. EF 35-40% by echo in 01/2019, at 25-30% by repeat echo in 05/2019 and 08/2019  . Depression   . High cholesterol   . Hypertension   . Hypothyroid   . Sleep apnea    wears CPAP at home    Past Surgical History:  Procedure Laterality Date  . BIOPSY  05/24/2018   Procedure: BIOPSY;  Surgeon: Rogene Houston, MD;  [facility];  Service: Endoscopy;;  gastric  . COLONOSCOPY    . COLONOSCOPY N/A 05/24/2018   Procedure: COLONOSCOPY;  Surgeon: Rogene Houston, MD;  [facility];  Service: Endoscopy;  Laterality: N/A;  8:55  . COLONOSCOPY WITH ESOPHAGOGASTRODUODENOSCOPY (EGD)    . CORONARY STENT INTERVENTION N/A 01/26/2019   Procedure: CORONARY STENT INTERVENTION;  Surgeon: Troy Sine, MD;  Location: [facility];  Service: Cardiovascular;  Laterality: N/A;  . CORONARY/GRAFT ACUTE MI REVASCULARIZATION N/A 01/26/2019   Procedure: Coronary/Graft Acute MI Revascularization;  Surgeon: Shelva Majestic  A, MD;  Location: [facility];  Service: Cardiovascular;  Laterality: N/A;  . ESOPHAGOGASTRODUODENOSCOPY N/A 05/24/2018   Procedure: ESOPHAGOGASTRODUODENOSCOPY (EGD);  Surgeon: Rogene Houston, MD;  [facility];  Service: Endoscopy;  Laterality: N/A;  . ICD IMPLANT N/A 12/12/2019   Procedure: ICD IMPLANT;  Surgeon: Deboraha Sprang, MD;  Location: [facility];  Service:  Cardiovascular;  Laterality: N/A;  . LEFT HEART CATH AND CORONARY ANGIOGRAPHY N/A 01/26/2019   Procedure: LEFT HEART CATH AND CORONARY ANGIOGRAPHY;  Surgeon: Troy Sine, MD;  Location: [facility];  Service: Cardiovascular;  Laterality: N/A;  . MEDIAL PARTIAL KNEE REPLACEMENT     2015  . POLYPECTOMY  05/24/2018   Procedure: POLYPECTOMY;  Surgeon: Rogene Houston, MD;  [facility];  Service: Endoscopy;;  colon  . rt hand injury    . shoulder surgery for bursitis    . TONSILLECTOMY     Family History:  Family History  Problem Relation Age of Onset  . Colon cancer Neg Hx    Family Psychiatric  History: none Social History:  Social History   Substance and Sexual Activity  Alcohol Use Yes   Comment: very rare     Social History   Substance and Sexual Activity  Drug Use Never    Social History   Socioeconomic History  . Marital status: Widowed    Spouse name: Not on file  . Number of children: Not on file  . Years of education: Not on file  . Highest education level: Not on file  Occupational History  . Not on file  Tobacco Use  . Smoking status: Former Smoker    Packs/day: 0.25    Years: 40.00    Pack years: 10.00    Types: Cigarettes  . Smokeless tobacco: Never Used  Substance and Sexual Activity  . Alcohol use: Yes    Comment: very rare  . Drug use: Never  . Sexual activity: Not on file  Other Topics Concern  . Not on file  Social History Narrative  . Not on file   Social Determinants of Health   Financial Resource Strain:   . Difficulty of Paying Living Expenses:   Food Insecurity:   . Worried About Charity fundraiser in the Last Year:   . Arboriculturist in the Last Year:   Transportation Needs:   . Film/video editor (Medical):   Marland Kitchen Lack of Transportation (Non-Medical):   Physical Activity:   . Days of Exercise per Week:   . Minutes of Exercise per Session:   Stress:   . Feeling of Stress :   Social Connections:   .  Frequency of Communication with Friends and Family:   . Frequency of Social Gatherings with Friends and Family:   . Attends Religious Services:   . Active Member of Clubs or Organizations:   . Attends Archivist Meetings:   Marland Kitchen Marital Status:    Additional Social History:    Allergies:  No Known Allergies  Labs:  Results for orders placed or performed during the hospital encounter of 02/02/20 (from the past 48 hour(s))  Glucose, capillary     Status: Abnormal   Collection Time: 02/04/20  4:42 PM  Result Value Ref Range   Glucose-Capillary 124 (H) 70 - 99 mg/dL    Comment: Glucose reference range applies only to samples taken after fasting for at least 8 hours.  Heparin level (unfractionated)  Status: None   Collection Time: 02/04/20  4:51 PM  Result Value Ref Range   Heparin Unfractionated 0.54 0.30 - 0.70 IU/mL    Comment: (NOTE) If heparin results are below expected values, and patient dosage has  been confirmed, suggest follow up testing of antithrombin III levels. Performed at [facility], [place] [address] [place], [place] [address]   Glucose, capillary     Status: Abnormal   Collection Time: 02/04/20  5:35 PM  Result Value Ref Range   Glucose-Capillary 133 (H) 70 - 99 mg/dL    Comment: Glucose reference range applies only to samples taken after fasting for at least 8 hours.  Glucose, capillary     Status: Abnormal   Collection Time: 02/04/20  9:43 PM  Result Value Ref Range   Glucose-Capillary 108 (H) 70 - 99 mg/dL    Comment: Glucose reference range applies only to samples taken after fasting for at least 8 hours.  Glucose, capillary     Status: Abnormal   Collection Time: 02/05/20  5:52 AM  Result Value Ref Range   Glucose-Capillary 100 (H) 70 - 99 mg/dL    Comment: Glucose reference range applies only to samples taken after fasting for at least 8 hours.  Cooxemetry Panel (carboxy, met, total hgb, O2 sat)     Status: Abnormal   Collection Time:  02/05/20  6:03 AM  Result Value Ref Range   Total hemoglobin 11.3 (L) 12.0 - 16.0 g/dL   O2 Saturation 54.1 %   Carboxyhemoglobin 1.4 0.5 - 1.5 %   Methemoglobin 1.1 0.0 - 1.5 %    Comment: Performed at [facility], [place] [address] [place], [place] [address]  CBC     Status: Abnormal   Collection Time: 02/05/20  6:03 AM  Result Value Ref Range   WBC 9.1 4.0 - 10.5 K/uL   RBC 4.01 (L) 4.22 - 5.81 MIL/uL   Hemoglobin 11.0 (L) 13.0 - 17.0 g/dL   HCT 35.3 (L) 39.0 - 52.0 %   MCV 88.0 80.0 - 100.0 fL   MCH 27.4 26.0 - 34.0 pg   MCHC 31.2 30.0 - 36.0 g/dL   RDW 17.1 (H) 11.5 - 15.5 %   Platelets 142 (L) 150 - 400 K/uL   nRBC 0.4 (H) 0.0 - 0.2 %    Comment: Performed at [facility] [address], [place], [place] [address]  Magnesium     Status: None   Collection Time: 02/05/20  6:03 AM  Result Value Ref Range   Magnesium 1.8 1.7 - 2.4 mg/dL    Comment: Performed at [facility] [address], [place], [place] [address]  Heparin level (unfractionated)     Status: None   Collection Time: 02/05/20  6:03 AM  Result Value Ref Range   Heparin Unfractionated 0.36 0.30 - 0.70 IU/mL    Comment: (NOTE) If heparin results are below expected values, and patient dosage has  been confirmed, suggest follow up testing of antithrombin III levels. Performed at [facility], [place] [address], [place], [place] [address]   Comprehensive metabolic panel     Status: Abnormal   Collection Time: 02/05/20  6:03 AM  Result Value Ref Range   Sodium 136 135 - 145 mmol/L   Potassium 4.0 3.5 - 5.1 mmol/L   Chloride 108 98 - 111 mmol/L   CO2 21 (L) 22 - 32 mmol/L   Glucose, Bld 94 70 - 99  mg/dL    Comment: Glucose reference range applies only to samples taken after fasting for at least 8 hours.   BUN 20 8 - 23 mg/dL   Creatinine, Ser 0.80 0.61 - 1.24 mg/dL   Calcium 8.0 (L) 8.9 - 10.3 mg/dL   Total Protein 5.2 (L) 6.5 - 8.1 g/dL   Albumin 2.7 (L) 3.5 - 5.0 g/dL   AST 53 (H) 15  - 41 U/L   ALT 220 (H) 0 - 44 U/L   Alkaline Phosphatase 71 38 - 126 U/L   Total Bilirubin 1.5 (H) 0.3 - 1.2 mg/dL   GFR calc non Af Amer >60 >60 mL/min   GFR calc Af Amer >60 >60 mL/min   Anion gap 7 5 - 15    Comment: Performed at [facility] [address] [place], [place] [address]  Glucose, capillary     Status: Abnormal   Collection Time: 02/05/20 12:19 PM  Result Value Ref Range   Glucose-Capillary 111 (H) 70 - 99 mg/dL    Comment: Glucose reference range applies only to samples taken after fasting for at least 8 hours.  Glucose, capillary     Status: Abnormal   Collection Time: 02/05/20  4:30 PM  Result Value Ref Range   Glucose-Capillary 129 (H) 70 - 99 mg/dL    Comment: Glucose reference range applies only to samples taken after fasting for at least 8 hours.  Glucose, capillary     Status: Abnormal   Collection Time: 02/05/20  9:07 PM  Result Value Ref Range   Glucose-Capillary 108 (H) 70 - 99 mg/dL    Comment: Glucose reference range applies only to samples taken after fasting for at least 8 hours.  Glucose, capillary     Status: Abnormal   Collection Time: 02/06/20  2:28 AM  Result Value Ref Range   Glucose-Capillary 117 (H) 70 - 99 mg/dL    Comment: Glucose reference range applies only to samples taken after fasting for at least 8 hours.  CBC     Status: Abnormal   Collection Time: 02/06/20  4:40 AM  Result Value Ref Range   WBC 9.3 4.0 - 10.5 K/uL   RBC 3.83 (L) 4.22 - 5.81 MIL/uL   Hemoglobin 10.5 (L) 13.0 - 17.0 g/dL   HCT 33.2 (L) 39.0 - 52.0 %   MCV 86.7 80.0 - 100.0 fL   MCH 27.4 26.0 - 34.0 pg   MCHC 31.6 30.0 - 36.0 g/dL   RDW 17.2 (H) 11.5 - 15.5 %   Platelets 176 150 - 400 K/uL   nRBC 0.4 (H) 0.0 - 0.2 %    Comment: Performed at [facility] [address], [place], [place] [address]  Magnesium     Status: None   Collection Time: 02/06/20  4:40 AM  Result Value Ref Range   Magnesium 2.1 1.7 - 2.4 mg/dL    Comment: Performed at [facility] [address] [place], [place] [address]  Heparin level (unfractionated)     Status: Abnormal   Collection Time: 02/06/20  4:40 AM  Result Value Ref Range   Heparin Unfractionated 0.20 (L) 0.30 - 0.70 IU/mL    Comment: (NOTE) If heparin results are below expected values, and patient dosage has  been confirmed, suggest follow up testing of antithrombin III levels. Performed at [facility], [place] [address], [place],  [address]   Basic metabolic panel     Status: Abnormal  Collection Time: 02/06/20  4:40 AM  Result Value Ref Range   Sodium 137 135 - 145 mmol/L   Potassium 4.5 3.5 - 5.1 mmol/L   Chloride 104 98 - 111 mmol/L   CO2 23 22 - 32 mmol/L   Glucose, Bld 122 (H) 70 - 99 mg/dL    Comment: Glucose reference range applies only to samples taken after fasting for at least 8 hours.   BUN 19 8 - 23 mg/dL   Creatinine, Ser 1.09 0.61 - 1.24 mg/dL   Calcium 8.4 (L) 8.9 - 10.3 mg/dL   GFR calc non Af Amer >60 >60 mL/min   GFR calc Af Amer >60 >60 mL/min   Anion gap 10 5 - 15    Comment: Performed at [facility] [address], [place], [place] [address]  Hepatic function panel     Status: Abnormal   Collection Time: 02/06/20  4:40 AM  Result Value Ref Range   Total Protein 5.4 (L) 6.5 - 8.1 g/dL   Albumin 2.9 (L) 3.5 - 5.0 g/dL   AST 42 (H) 15 - 41 U/L   ALT 176 (H) 0 - 44 U/L   Alkaline Phosphatase 70 38 - 126 U/L   Total Bilirubin 1.4 (H) 0.3 - 1.2 mg/dL   Bilirubin, Direct 0.4 (H) 0.0 - 0.2 mg/dL   Indirect Bilirubin 1.0 (H) 0.3 - 0.9 mg/dL    Comment: Performed at [facility] [address], [place], [place] [address]  Glucose, capillary     Status: Abnormal   Collection Time: 02/06/20  6:24 AM  Result Value Ref Range   Glucose-Capillary 108 (H) 70 - 99 mg/dL    Comment: Glucose reference range applies only to samples taken after fasting for at least 8 hours.  Blood gas, arterial     Status: Abnormal   Collection Time: 02/06/20   8:28 AM  Result Value Ref Range   FIO2 32.00    pH, Arterial 7.424 7.350 - 7.450   pCO2 arterial 32.2 32.0 - 48.0 mmHg   pO2, Arterial 95.5 83.0 - 108.0 mmHg   Bicarbonate 20.7 20.0 - 28.0 mmol/L   Acid-base deficit 3.0 (H) 0.0 - 2.0 mmol/L   O2 Saturation 97.1 %   Patient temperature 36.8    Collection site LEFT RADIAL    Drawn by [id number]     Comment: COLLECTED BY RT   Sample type ARTERIAL DRAW    Allens test (pass/fail) PASS PASS    Comment: Performed at [facility], [place] [address], [place], [place] [address]  Glucose, capillary     Status: Abnormal   Collection Time: 02/06/20 11:09 AM  Result Value Ref Range   Glucose-Capillary 104 (H) 70 - 99 mg/dL    Comment: Glucose reference range applies only to samples taken after fasting for at least 8 hours.    Current Facility-Administered Medications  Medication Dose Route Frequency Provider Last Rate Last Admin  . albuterol (PROVENTIL) (2.5 MG/3ML) 0.083% nebulizer solution 3 mL  3 mL Inhalation Q6H PRN Seawell, Jaimie A, DO      . ARIPiprazole (ABILIFY) tablet 10 mg  10 mg Oral Daily Seawell, Jaimie A, DO   10 mg at 02/06/20 0914  . atorvastatin (LIPITOR) tablet 80 mg  80 mg Oral q1800 Seawell, Jaimie A, DO   80 mg at 02/05/20 1705  . Chlorhexidine Gluconate Cloth 2 % PADS 6 each  6 each Topical Daily Lucious Groves, DO  Stopped at 02/06/20 1101  . clonazePAM (KLONOPIN) tablet 1 mg  1 mg Oral Daily Patrecia Pour, NP   1 mg at 02/06/20 0908  . digoxin (LANOXIN) tablet 0.125 mg  0.125 mg Oral Daily Bensimhon, Shaune Pascal, MD   0.125 mg at 02/06/20 0906  . feeding supplement (GLUCERNA SHAKE) (GLUCERNA SHAKE) liquid 237 mL  237 mL Oral TID BM Joni Reining C, DO   237 mL at 27/03/50 0938  . folic acid (FOLVITE) tablet 1 mg  1 mg Oral Daily Seawell, Jaimie A, DO   1 mg at 02/06/20 0908  . furosemide (LASIX) tablet 40 mg  40 mg Oral Daily Bensimhon, Shaune Pascal, MD   40 mg at 02/06/20 0912  . heparin ADULT infusion 100 units/mL (25000  units/232m sodium chloride 0.45%)  2,000 Units/hr Intravenous Continuous HLucious Groves DO 20 mL/hr at 02/06/20 1532 2,000 Units/hr at 02/06/20 1532  . insulin aspart (novoLOG) injection 0-6 Units  0-6 Units Subcutaneous TID WC Seawell, Jaimie A, DO      . levothyroxine (SYNTHROID) tablet 125 mcg  125 mcg Oral QAC breakfast Seawell, Jaimie A, DO   125 mcg at 02/06/20 0905  . milrinone (PRIMACOR) 20 MG/100 ML (0.2 mg/mL) infusion  0.25 mcg/kg/min Intravenous Continuous Bensimhon, DShaune Pascal MD 7.01 mL/hr at 02/06/20 1533 0.25 mcg/kg/min at 02/06/20 1533  . multivitamin with minerals tablet 1 tablet  1 tablet Oral Daily HLucious Groves DO   1 tablet at 02/06/20 0908  . pantoprazole (PROTONIX) EC tablet 40 mg  40 mg Oral Daily Seawell, Jaimie A, DO   40 mg at 02/06/20 0907  . sodium chloride flush (NS) 0.9 % injection 10-40 mL  10-40 mL Intracatheter PRN HJoni ReiningC, DO      . sodium chloride flush (NS) 0.9 % injection 10-40 mL  10-40 mL Intracatheter Q12H Hoffman, Erik C, DO   10 mL at 02/05/20 0934  . spironolactone (ALDACTONE) tablet 12.5 mg  12.5 mg Oral Daily Bensimhon, DShaune Pascal MD   12.5 mg at 02/06/20 01829   Musculoskeletal: Strength & Muscle Tone: decreased Gait & Station: did not witness Patient leans: N/A  Psychiatric Specialty Exam: Physical Exam  Nursing note and vitals reviewed. Constitutional: He is oriented to person, place, and time. He appears well-developed and well-nourished.  HENT:  Head: Normocephalic.  Respiratory: Effort normal.  Musculoskeletal:     Cervical back: Normal range of motion.  Neurological: He is alert and oriented to person, place, and time.  Psychiatric: His speech is normal and behavior is normal. Judgment and thought content normal. His mood appears anxious. Cognition and memory are normal.    Review of Systems  Constitutional: Positive for diaphoresis.  HENT: Negative.   Eyes: Negative.   Respiratory: Negative.   Cardiovascular:  Negative.   Gastrointestinal: Negative.   Endocrine: Negative.   Genitourinary: Negative.   Musculoskeletal: Negative.   Allergic/Immunologic: Negative.   Neurological: Positive for tremors.  Hematological: Negative.   Psychiatric/Behavioral: The patient is nervous/anxious.   All other systems reviewed and are negative.   Blood pressure 109/71, pulse 94, temperature 98.2 F (36.8 C), temperature source Oral, resp. rate (!) 23, height 5' 10" (1.778 m), weight 97.2 kg, SpO2 91 %.Body mass index is 30.73 kg/m.  General Appearance: Disheveled  Eye Contact:  Fair  Speech:  Normal Rate  Volume:  Normal  Mood:  Anxious  Affect:  Congruent  Thought Process:  Coherent and Descriptions of Associations: Intact  Orientation:  Other:  person and place  Thought Content:  Consult for hallucinations, patient denies  Suicidal Thoughts:  No  Homicidal Thoughts:  No  Memory:  Immediate;   Fair Recent;   Fair Remote;   Fair  Judgement:  Fair  Insight:  Fair  Psychomotor Activity:  Decreased  Concentration:  Concentration: Fair and Attention Span: Fair  Recall:  [employer] of Knowledge:  Fair  Language:  Good  Akathisia:  No  Handed:  Right  AIMS (if indicated):     Assets:  Leisure Time Resilience  ADL's:  Intact  Cognition:  WNL  Sleep:      64 year old male presented to the emergency department with cardiac issues and consult placed for hallucinations, past history of alcohol use disorder.  He denies seeing or hearing things, states he cannot see without his glasses that he currently does not have.  Low level of anxiety noted along with tremors and sweats.  Concern is for benzodiazepine withdrawal as he was taking Klonopin 1 mg daily for the past 6 months according to the PDMP.  Recommend is provider taper off his Klonopin and titrate on to an SSRI in the outpatient arena.  Treatment Plan Summary: Benzodiazepine dependency: -continue his Klonopin 1 mg daily, recommend his provider who  prescribes this medication to taper off the Klonopin as they titrate on to an SSRI for better long-term control of anxiety with less side effects  Mood disorder, unspecified: -Continue Abilify 10 mg daily  Disposition: No evidence of imminent risk to self or others at present.    Waylan Boga, NP 02/06/2020 4:10 PM

## 2020-02-06 NOTE — Progress Notes (Signed)
Subjective:   Pt seen at the bedside this morning. Sitter in the room. Overnight, pt became confused and pulled out PICC line and IVs. Upon evaluation he was hallucinating but easily directable.   This morning, pt is calm and cooperative, though still slow to respond. He cannot remember taking his lines out yesterday. States he sees Dr. Cephus Shelling for depression. Doesn't think he has been diagnosed with any other disorder like bipolar or schizophrenia.   Objective:  Vital signs in last 24 hours: Vitals:   02/06/20 0125 02/06/20 0229 02/06/20 0257 02/06/20 0349  BP: (!) 87/58 101/67 101/71 109/77  Pulse: 91   99  Resp: 20  14 20   Temp: 98 F (36.7 C)   98 F (36.7 C)  TempSrc: Oral   Oral  SpO2: 100%  93% 91%  Weight: 97.2 kg     Height:       Physical Exam Vitals and nursing note reviewed.  Constitutional:      General: He is not in acute distress.    Appearance: He is ill-appearing (chronically).     Comments: Pt alert and sitting at the edge of the bed.  Pulmonary:     Effort: Pulmonary effort is normal. No tachypnea or respiratory distress.     Breath sounds: Normal breath sounds.  Musculoskeletal:     Comments: +1 pitting edema to mid-shin bilaterally.  Skin:    General: Skin is warm and dry.  Neurological:     Mental Status: He is alert.  Psychiatric:     Comments: Answering questions appropriately, though with increased latency of responses. Pt has impaired memory of the recent days and hospital events.    Assessment/Plan:  Principal Problem:   Cardiogenic shock (HCC) Active Problems:   Acute exacerbation of CHF (congestive heart failure) (HCC)   Acute encephalopathy  64yo male with PMH medical history of ischemic cardiomyopathy status post STEMI and PCI in March 2020, HFrEF, hypertension, hyperlipidemia, OSA, and depression who presented with altered mental status, LE edema, and elevated BNP.   Cardiogenic Shock Acute HFrEF Exacerbation - EF 15-20% Pt  given 40mg  PO lasix and was net +1.3 yesterday after 300cc urine output. Weights continue to fluctuate as pt is ?up another 0.4kg. Mild pitting edema in LE's, much improved from admission. BP soft but stable with SBP 90-110s. Cr normal at 1.09. K 4.5 and Mg 2.1 this AM. Advanced heart failure consulted, appreciate their input in this case - PICC removed overnight and replaced today - CVP 6-7 - PM Co-ox 50 (from 54 << 70) - increase milrinone back to 0.375 from 0.25 - VAD eval pending resolution of pt's AMS (suspect worsened due to pt's low output HF) - continue digoxin 0.125 and spiro 12.5 - holding b-blocker in setting of acute HF - BP too low for ARB/ARNI - keep mg >2, K > 4 - strict I/O, need daily weights  AMS MDD Mental status appears to wax and wane.  - given severity of pt's illness, will consult psych for cognitive eval  - restart low dose Abilify 10mg  daily and Klonopin 1mg  daily for possible withdrawal symptoms.  Hx of LV Thrombus - continue heparin drip with inpatient in case of procedure - holding pt's home Xarelto  Normocytic Anemia  Thrombocytopenia - resolved Plts back up to 176. Hgb remains low at 10.5 this AM, was closer to 12 or 13 on admission/prior baseline. - ordered ferritin, iron panel, B12, and MMA  CAD s/p STEMI, PCI 01/2019 w  LAD stent - plavix stopped on 3/28 for potential VAD - continue atorvastatin  BPH Foley in place per HF to record strict I/O's.  Prior to Admission Living Arrangement: home Anticipated Discharge Location: ?home vs facility Barriers to Discharge: clinical improvement Dispo: Anticipated discharge pending clinical course   Ladona Horns, MD 02/06/2020, 6:13 AM Pager: [phone]

## 2020-02-06 NOTE — Progress Notes (Addendum)
Advanced Heart Failure Rounding Note  PCP-Cardiologist: Shelva Majestic, MD   Subjective:    Echo EF 10% RV mild to moderately down   Remains on milrinone 0.25 mcg + heparin drip.   Earlier today having hallucinations and confusion. Pulled out his PICC.  Denies SOB. Complaining of fatigue.   Objective:   Weight Range: 97.2 kg Body mass index is 30.73 kg/m.   Vital Signs:   Temp:  [98 F (36.7 C)-98.4 F (36.9 C)] 98.2 F (36.8 C) (03/29 0755) Pulse Rate:  [91-104] 94 (03/29 0906) Resp:  [14-20] 20 (03/29 0349) BP: (87-118)/(58-88) 98/63 (03/29 0915) SpO2:  [91 %-100 %] 91 % (03/29 0349) Weight:  [97.2 kg] 97.2 kg (03/29 0125) Last BM Date: 02/04/20  Weight change: Filed Weights   02/04/20 0400 02/05/20 0109 02/06/20 0125  Weight: 92.5 kg 96.8 kg 97.2 kg    Intake/Output:   Intake/Output Summary (Last 24 hours) at 02/06/2020 0928 Last data filed at 02/06/2020 0854 Gross per 24 hour  Intake 1676.53 ml  Output 300 ml  Net 1376.53 ml      Physical Exam  Sitter at bedside.  General:  Sitting on the side of bed. Weak appearing confused No resp difficulty HEENT: normal Neck: supple. JVP 6 Carotids 2+ bilat; no bruits. No lymphadenopathy or thryomegaly appreciated. Cor: PMI nondisplaced. Regular rate & rhythm. +s3 Lungs: clear no wheeze,  Abdomen: soft, nontender, nondistended. No hepatosplenomegaly. No bruits or masses. Good bowel sounds. Extremities: no cyanosis, clubbing, rash, edema Neuro: alert & orientedx2 self and year , cranial nerves grossly intact. moves all 4 extremities w/o difficulty. Affect pleasant   Telemetry   SR 80-90s personally reviewed.    Labs    CBC Recent Labs    02/05/20 0603 02/06/20 0440  WBC 9.1 9.3  HGB 11.0* 10.5*  HCT 35.3* 33.2*  MCV 88.0 86.7  PLT 142* 0000000   Basic Metabolic Panel Recent Labs    02/05/20 0603 02/06/20 0440  NA 136 137  K 4.0 4.5  CL 108 104  CO2 21* 23  GLUCOSE 94 122*  BUN 20 19    CREATININE 0.80 1.09  CALCIUM 8.0* 8.4*  MG 1.8 2.1   Liver Function Tests Recent Labs    02/05/20 0603  AST 53*  ALT 220*  ALKPHOS 71  BILITOT 1.5*  PROT 5.2*  ALBUMIN 2.7*   No results for input(s): LIPASE, AMYLASE in the last 72 hours. Cardiac Enzymes No results for input(s): CKTOTAL, CKMB, CKMBINDEX, TROPONINI in the last 72 hours.  BNP: BNP (last 3 results) Recent Labs    12/02/19 1146 12/14/19 1048 02/02/20 1343  BNP 656.0* 438.6* 2,899.9*    ProBNP (last 3 results) Recent Labs    07/26/19 1102  PROBNP 992*     D-Dimer No results for input(s): DDIMER in the last 72 hours. Hemoglobin A1C No results for input(s): HGBA1C in the last 72 hours. Fasting Lipid Panel No results for input(s): CHOL, HDL, LDLCALC, TRIG, CHOLHDL, LDLDIRECT in the last 72 hours. Thyroid Function Tests No results for input(s): TSH, T4TOTAL, T3FREE, THYROIDAB in the last 72 hours.  Invalid input(s): FREET3  Other results:   Imaging    [place] EKG SITE RITE  Result Date: 02/05/2020 If Site Rite image not attached, placement could not be confirmed due to current cardiac rhythm.    Medications:     Scheduled Medications: . ARIPiprazole  10 mg Oral Daily  . atorvastatin  80 mg Oral q1800  .  Chlorhexidine Gluconate Cloth  6 each Topical Daily  . clonazePAM  1 mg Oral Daily  . digoxin  0.125 mg Oral Daily  . feeding supplement (GLUCERNA SHAKE)  237 mL Oral TID BM  . folic acid  1 mg Oral Daily  . furosemide  40 mg Oral Daily  . insulin aspart  0-6 Units Subcutaneous TID WC  . levothyroxine  125 mcg Oral QAC breakfast  . multivitamin with minerals  1 tablet Oral Daily  . pantoprazole  40 mg Oral Daily  . sodium chloride flush  10-40 mL Intracatheter Q12H  . spironolactone  12.5 mg Oral Daily    Infusions: . heparin 1,900 Units/hr (02/05/20 2333)  . milrinone 0.25 mcg/kg/min (02/06/20 0721)    PRN Medications: albuterol, sodium chloride flush     Assessment/Plan    1. Cardiogenic Shock  - Has ICD  - ECHO this admit EF 10% with RV mild moderately reduce. In 2020 EF 25-30%  - Lactic Acid trending down 2.9>2.1>1.7  - Replace PICC  - Milrinone 0.25 mcg.    .- Volume status stable. Continue lasix 40 po daily - Hold beta blocker due to low output state.  - Continue digoxin 0.125 - Continue spiro 12.5 - BP too low for ARB/ARNI - Shock improved improved with milrinone. He is failing milrinone wean and appears inotrope dependent. Thus I have discussed the possibility of VAD placement with him. He said he would be interested in VAD support if he needed it. How he has very limited social support and says he can only rely on his neighbors. We will continue to adjust his HF meds and may initiate the VAD process to see if he is a candidate early next week depending on further discussions with him. - VAD Team consulted    2. CAD s/p STEMI, PCI 01/2019 - No chest pain.  - Continue plavix + statin.  - no ASA given concomitant anticoagulation.  - plavix stopped 3/28. Given possibility of VAD  3. History of LV Thrombus - On Xarelto as outpatient.  - On heparin drip for possible invasive hemodynamics this admission. No bleeding  4. AKI  - In the setting of low output HF.   - Creatinine peaked at 1.5 - Todays creatinine is 1.1.   5. Hypothyroidism  - On levothyroxine.  6. AMS - CT of head was negative.  - UDS negative.  - Suspect related to low output HF.    7. Hypokalemia -Stable   8. Hypomagnesemia - Mag 2.1   Sitter at bedside. Replace PICC. Concern CO-OX will be down on lower dose of milrinone.   Length of Stay: 4  Amy Clegg, NP  02/06/2020, 9:28 AM  Advanced Heart Failure Team Pager [phone] (M-F; 7a - 4p)  Please contact [facility] for night-coverage after hours (4p -7a ) and weekends on [email or web address]  Patient seen and examined with the above-signed Advanced Practice Provider and/or Housestaff. I personally reviewed laboratory data,  imaging studies and relevant notes. I independently examined the patient and formulated the important aspects of the plan. I have edited the note to reflect any of my changes or salient points. I have personally discussed the plan with the patient and/or family.  More confused today. Pulled out PICC. Oriented only to person and year.   Denies SOB, orthopnea or PND.   PICC replaced CVP 6-7. Co-ox back to 50%   Suspect altered mental status may be in part related to recurrent low output HF.  Will increase  milrinone back to 0.375. D/w IMTS earlier today.   Has been seen by VAD team but not ready for eval yet with AMS.   Glori Bickers, MD  6:33 PM

## 2020-02-06 NOTE — Progress Notes (Signed)
Notified by nursing staff regarding Tony Alvarez increasing confusion and asked for an assessment. Tony Alvarez has disrupted his medical devices and is having visual hallucinations.  Upon arrival, Tony Alvarez was sitting upright in the bed and IMTS was at bedside.  Tony Alvarez was oriented to self, but had to be reoriented to place, time and situation. No drift to either extremity but the LLE was slightly weaker than the right. No alteration in speech, language, symmetry or coordination.  Pt visualized a clock face at the end of his bed at the footboard.

## 2020-02-07 ENCOUNTER — Encounter (HOSPITAL_COMMUNITY): Payer: Self-pay | Admitting: Internal Medicine

## 2020-02-07 DIAGNOSIS — R7401 Elevation of levels of liver transaminase levels: Secondary | ICD-10-CM

## 2020-02-07 LAB — BASIC METABOLIC PANEL
Anion gap: 9 (ref 5–15)
BUN: 14 mg/dL (ref 8–23)
CO2: 24 mmol/L (ref 22–32)
Calcium: 8.9 mg/dL (ref 8.9–10.3)
Chloride: 103 mmol/L (ref 98–111)
Creatinine, Ser: 0.92 mg/dL (ref 0.61–1.24)
GFR calc Af Amer: >60 mL/min (ref >60)
GFR calc non Af Amer: >60 mL/min (ref >60)
Glucose, Bld: 100 mg/dL — ABNORMAL HIGH (ref 70–99)
Potassium: 4.3 mmol/L (ref 3.5–5.1)
Sodium: 136 mmol/L (ref 135–145)

## 2020-02-07 LAB — HEPATIC FUNCTION PANEL
ALT: 139 U/L — ABNORMAL HIGH (ref 0–44)
AST: 33 U/L (ref 15–41)
Albumin: 2.8 g/dL — ABNORMAL LOW (ref 3.5–5.0)
Alkaline Phosphatase: 64 U/L (ref 38–126)
Bilirubin, Direct: 0.4 mg/dL — ABNORMAL HIGH (ref 0.0–0.2)
Indirect Bilirubin: 1.1 mg/dL — ABNORMAL HIGH (ref 0.3–0.9)
Total Bilirubin: 1.5 mg/dL — ABNORMAL HIGH (ref 0.3–1.2)
Total Protein: 5.4 g/dL — ABNORMAL LOW (ref 6.5–8.1)

## 2020-02-07 LAB — GLUCOSE, CAPILLARY
Glucose-Capillary: 100 mg/dL — ABNORMAL HIGH (ref 70–99)
Glucose-Capillary: 121 mg/dL — ABNORMAL HIGH (ref 70–99)
Glucose-Capillary: 122 mg/dL — ABNORMAL HIGH (ref 70–99)
Glucose-Capillary: 122 mg/dL — ABNORMAL HIGH (ref 70–99)
Glucose-Capillary: 93 mg/dL (ref 70–99)

## 2020-02-07 LAB — CBC
HCT: 32.6 % — ABNORMAL LOW (ref 39.0–52.0)
Hemoglobin: 10.2 g/dL — ABNORMAL LOW (ref 13.0–17.0)
MCH: 27.5 pg (ref 26.0–34.0)
MCHC: 31.3 g/dL (ref 30.0–36.0)
MCV: 87.9 fL (ref 80.0–100.0)
Platelets: 182 10*3/uL (ref 150–400)
RBC: 3.71 MIL/uL — ABNORMAL LOW (ref 4.22–5.81)
RDW: 17.2 % — ABNORMAL HIGH (ref 11.5–15.5)
WBC: 8.5 10*3/uL (ref 4.0–10.5)
nRBC: 0.4 % — ABNORMAL HIGH (ref 0.0–0.2)

## 2020-02-07 LAB — HEPARIN LEVEL (UNFRACTIONATED)
Heparin Unfractionated: 0.11 IU/mL — ABNORMAL LOW (ref 0.30–0.70)
Heparin Unfractionated: 1.04 IU/mL — ABNORMAL HIGH (ref 0.30–0.70)
Heparin Unfractionated: 0.99 IU/mL — ABNORMAL HIGH (ref 0.30–0.70)

## 2020-02-07 LAB — T4, FREE: Free T4: 0.68 ng/dL (ref 0.61–1.12)

## 2020-02-07 LAB — COOXEMETRY PANEL
Carboxyhemoglobin: 1.3 % (ref 0.5–1.5)
Methemoglobin: 1 % (ref 0.0–1.5)
O2 Saturation: 57.4 %
Total hemoglobin: 10.8 g/dL — ABNORMAL LOW (ref 12.0–16.0)

## 2020-02-07 LAB — MAGNESIUM: Magnesium: 2 mg/dL (ref 1.7–2.4)

## 2020-02-07 MED ORDER — FUROSEMIDE 10 MG/ML IJ SOLN
40.0000 mg | Freq: Once | INTRAMUSCULAR | Status: AC
  Administered 2020-02-07: 13:00:00 40 mg via INTRAVENOUS
  Filled 2020-02-07: qty 4

## 2020-02-07 MED ORDER — CLONAZEPAM 0.5 MG PO TABS
0.5000 mg | ORAL_TABLET | Freq: Two times a day (BID) | ORAL | Status: DC
  Administered 2020-02-07 – 2020-02-14 (×14): 0.5 mg via ORAL
  Filled 2020-02-07 (×14): qty 1

## 2020-02-07 NOTE — Plan of Care (Signed)
°  Problem: Education: °Goal: Ability to demonstrate management of disease process will improve °Outcome: Progressing °Goal: Ability to verbalize understanding of medication therapies will improve °Outcome: Progressing °Goal: Individualized Educational Video(s) °Outcome: Progressing °  °

## 2020-02-07 NOTE — Progress Notes (Addendum)
Came to try to ambulate. Pt taking off clothes/leads because he needed to urinate. Assisted him and the sitter to help with this then back to bed, pt quite SOB with activity. He took off O2 and pulse ox but when reapplied SaO2 96 RA. pt not able to follow commands enough to walk. Will f/u tomorrow or when he is appropriate. [place] CES, ACSM 3:18 PM 02/07/2020

## 2020-02-07 NOTE — Progress Notes (Addendum)
Subjective:   Patient seen this a.m. by team with sitter at bedside. Mr. Tony Alvarez recently administered Klonopin. Patient is calm and cooperative, though still slow to respond. He denies SOB and fatigue mildly improved, no other complaints.  Objective:  Vital signs in last 24 hours: Vitals:   02/07/20 0242 02/07/20 0300 02/07/20 0800 02/07/20 1054  BP:  103/65 102/71   Pulse:  85  84  Resp:  (!) 25    Temp:  98.3 F (36.8 C)  97.7 F (36.5 C)  TempSrc:  Oral  Oral  SpO2: 94% 92% 95%   Weight:  96.8 kg    Height:       Physical Exam Vitals and nursing note reviewed.  Constitutional:      General: He is not in acute distress.    Appearance: He is ill-appearing (chronically).     Comments: Pt generally alert and sitting up comfortably in bed  Pulmonary:     Effort: Pulmonary effort is normal. No tachypnea or respiratory distress.     Breath sounds: Normal breath sounds.  Musculoskeletal:     Comments: +1 pitting edema to mid-shin bilaterally.  Skin:    General: Skin is warm and dry.  Neurological:     Mental Status: He is alert.  Psychiatric:     Comments: Answering questions appropriately, though with increased latency of responses. Pt has impaired memory of the recent days and hospital events.    Assessment/Plan:  Principal Problem:   Cardiogenic shock (HCC) Active Problems:   Acute exacerbation of CHF (congestive heart failure) (HCC)   Acute encephalopathy  64yo male with PMH medical history of ischemic cardiomyopathy status post STEMI and PCI in March 2020, HFrEF, hypertension, hyperlipidemia, OSA, and depression who presented with altered mental status, LE edema, and elevated BNP.   Cardiogenic Shock Acute HFrEF Exacerbation - EF 15-20% Pt improved since increasing milrinone back to 0.375 after trying to wean to 0.24mcg/kg/min. Mental status still somnolent, but patient much less agitated. Denies current shortness of breath. Lactic acid trending down from 2.1  to 1.7,  CO-OX has increased from 49.9 to 57.4, and CVP trending back up 11-12. Advanced heart failure consulted, and we appreciate their input in this case - PICC removed overnight and replaced today - continue milrinone 0.375 - continue Lasix 40 mg PO daily - continue digoxin 0.125 and spiro 12.5 - holding b-blocker in setting of acute HF - BP too low for ARB/ARNI - keep mg >2, K > 4 - strict I/O, need daily weights - VAD eval pending resolution of pt's AMS  AMS MDD Mental status appears to wax and wane, although improving w/ increased dose of milrinone. Psychiatry has been consulted, and we appreciate their input in this case. - Per Psychiatry, continue Klonopin and Abilify 10 mg daily. Outpatient plan to taper Klonopin and titrate SSRI  Hx of LV Thrombus - continue heparin drip with inpatient in case of procedure - holding pt's home Xarelto  Normocytic Anemia  Thrombocytopenia - resolved Plts back up to 176. Hgb remains low at 10.5 this AM, was closer to 12 or 13 on admission/prior baseline. - ordered ferritin, iron panel, B12, and MMA  CAD s/p STEMI, PCI 01/2019 w LAD stent - plavix stopped on 3/28 for potential VAD - continue atorvastatin  BPH Foley in place per HF to record strict I/O's.  Prior to Admission Living Arrangement: home Anticipated Discharge Location: ?home vs facility Barriers to Discharge: clinical improvement Dispo: Anticipated discharge pending clinical  course   Tony Heck, DO 02/07/2020, 3:24 PM Pager: [phone]

## 2020-02-07 NOTE — Progress Notes (Signed)
[place] for Heparin (without bolus) Indication: History of Mural Thrombus  No Known Allergies  Patient Measurements: Height: 5\' 10"  (177.8 cm) Weight: 213 lb 6.4 oz (96.8 kg) IBW/kg (Calculated) : 73 Heparin Dosing Weight:  91.9 kg  Vital Signs: Temp: 97.7 F (36.5 C) (03/30 1624) Temp Source: Oral (03/30 1624) BP: 106/64 (03/30 1624) Pulse Rate: 85 (03/30 1624)  Labs: Recent Labs    02/05/20 0603 02/05/20 0603 02/06/20 0440 02/06/20 1627 02/07/20 0445 02/07/20 1330 02/07/20 1558  HGB 11.0*   < > 10.5*  --  10.2*  --   --   HCT 35.3*  --  33.2*  --  32.6*  --   --   PLT 142*  --  176  --  182  --   --   HEPARINUNFRC 0.36   < > 0.20*   < > 0.11* 1.04* 0.99*  CREATININE 0.80  --  1.09  --  0.92  --   --    < > = values in this interval not displayed.    Estimated Creatinine Clearance: 98.4 mL/min (by C-G formula based on SCr of 0.92 mg/dL).   Medical History: Past Medical History:  Diagnosis Date  . Barrett's esophagus 04/21/2018  . CAD (coronary artery disease)    a. s/p STEMI in 01/2019 with DES to proximal LAD  . CHF (congestive heart failure) ([place])    a. EF 35-40% by echo in 01/2019, at 25-30% by repeat echo in 05/2019 and 08/2019  . Depression   . High cholesterol   . Hypertension   . Hypothyroid   . Sleep apnea    wears CPAP at home   Assessment: 64 yr old male with hx of ischemic cardiomyopathy S/P STEMI and PCI in 01/2019, HFrEF (last EF 25-30% in 08/2019), HTN, HLD, OSA, and depression presented with AMS and potential cardiogenic shock. Pt was previously on Xarelto for mural thrombus with unknown compliance (not on current med list, but pt told RN he was taking Xarelto; however, pt is confused). Pharmacy is consulted to dose heparin for history of mural thrombus.   Heparin level came back at 1.04, on 2300 units/hr- running in PICC line, drawn from PICC line. Stat repeat heparin level drawn peripherally remains  high at 0.99.  CBC stable - Hgb 10.2, plt 182. No bleeding or issues with infusion per discussion with RN.  Goal of Therapy:  Heparin level 0.3-0.7 units/ml Monitor platelets by anticoagulation protocol: Yes   Plan:  Decrease heparin IV to 2050 units/hr 6hr heparin level Monitor daily heparin level and CBC, s/sx bleeding F/u long-term plan for anticoagulation   Thank you,  Arturo Morton, PharmD, BCPS Please check AMION for all [place] contact numbers Clinical Pharmacist 02/07/2020 6:02 PM

## 2020-02-07 NOTE — Progress Notes (Signed)
[place] for Heparin (without bolus) Indication: History of Mural Thrombus  No Known Allergies  Patient Measurements: Height: 5\' 10"  (177.8 cm) Weight: 213 lb 6.4 oz (96.8 kg) IBW/kg (Calculated) : 73 Heparin Dosing Weight:  91.9 kg  Vital Signs: Temp: 97.7 F (36.5 C) (03/30 1054) Temp Source: Oral (03/30 1054) BP: 102/71 (03/30 0800) Pulse Rate: 84 (03/30 1054)  Labs: Recent Labs    02/05/20 0603 02/05/20 0603 02/06/20 0440 02/06/20 0440 02/06/20 1627 02/07/20 0445 02/07/20 1330  HGB 11.0*   < > 10.5*  --   --  10.2*  --   HCT 35.3*  --  33.2*  --   --  32.6*  --   PLT 142*  --  176  --   --  182  --   HEPARINUNFRC 0.36   < > 0.20*   < > 0.29* 0.11* 1.04*  CREATININE 0.80  --  1.09  --   --  0.92  --    < > = values in this interval not displayed.    Estimated Creatinine Clearance: 98.4 mL/min (by C-G formula based on SCr of 0.92 mg/dL).   Medical History: Past Medical History:  Diagnosis Date  . Barrett's esophagus 04/21/2018  . CAD (coronary artery disease)    a. s/p STEMI in 01/2019 with DES to proximal LAD  . CHF (congestive heart failure) ([place])    a. EF 35-40% by echo in 01/2019, at 25-30% by repeat echo in 05/2019 and 08/2019  . Depression   . High cholesterol   . Hypertension   . Hypothyroid   . Sleep apnea    wears CPAP at home   Assessment: 64 yr old male with hx of ischemic cardiomyopathy S/P STEMI and PCI in 01/2019, HFrEF (last EF 25-30% in 08/2019), HTN, HLD, OSA, and depression presented with AMS and potential cardiogenic shock. Pt was previously on Xarelto for mural thrombus, with unknown compliance (not on current med list, but pt told RN he was taking Xarelto; however, pt is confused). Pharmacy is consulted to dose heparin for history of mural thrombus.   Heparin level came back at 1.04, on 2300 units/hr- running in PICC line, drawn from PICC line. Hgb 10.2, plt 182. No s/sx of bleeding.   Goal of  Therapy:  Heparin level 0.3-0.7 units/ml Monitor platelets by anticoagulation protocol: Yes   Plan:  -Will get peripheral draw to confirm heparin level   Thank you,  Antonietta Jewel, PharmD, BCCCP Clinical Pharmacist  Phone: [phone]  Please check AMION for all [place] phone numbers After 10:00 PM, call [place] [phone]

## 2020-02-07 NOTE — Progress Notes (Addendum)
Advanced Heart Failure Rounding Note  PCP-Cardiologist: Shelva Majestic, MD   Subjective:    Echo EF 10% RV mild to moderately down   Remains on milrinone 0.375 mcg + heparin drip. CO-OX up to 57%.   Denies SOB. Remains confused with a sitter at his bedside.    Objective:   Weight Range: 96.8 kg Body mass index is 30.62 kg/m.   Vital Signs:   Temp:  [98 F (36.7 C)-98.3 F (36.8 C)] 98.3 F (36.8 C) (03/30 0300) Pulse Rate:  [80-94] 85 (03/30 0300) Resp:  [20-31] 25 (03/30 0300) BP: (100-114)/(65-77) 102/71 (03/30 0800) SpO2:  [84 %-100 %] 95 % (03/30 0800) Weight:  [96.8 kg] 96.8 kg (03/30 0300) Last BM Date: 02/04/20  Weight change: Filed Weights   02/05/20 0109 02/06/20 0125 02/07/20 0300  Weight: 96.8 kg 97.2 kg 96.8 kg    Intake/Output:   Intake/Output Summary (Last 24 hours) at 02/07/2020 1026 Last data filed at 02/07/2020 0802 Gross per 24 hour  Intake 1618.28 ml  Output 2100 ml  Net -481.72 ml      Physical Exam  CVP 11-12  General:  In bed. No resp difficulty. Sitter at bedside. HEENT: normal Neck: supple. JVP 11-12 . Carotids 2+ bilat; no bruits. No lymphadenopathy or thryomegaly appreciated. Cor: PMI nondisplaced. Regular rate & rhythm. No rubs, or murmurs. +S3  Lungs: clear Abdomen: soft, nontender, nondistended. No hepatosplenomegaly. No bruits or masses. Good bowel sounds. Extremities: no cyanosis, clubbing, rash, edema. RUE PICC Neuro: alert & orientedx2, cranial nerves grossly intact. moves all 4 extremities w/o difficulty. Affect pleasant   Telemetry   SR 80-90s  Personally reviewed    Labs    CBC Recent Labs    02/06/20 0440 02/07/20 0445  WBC 9.3 8.5  HGB 10.5* 10.2*  HCT 33.2* 32.6*  MCV 86.7 87.9  PLT 176 [id number]   Basic Metabolic Panel Recent Labs    02/06/20 0440 02/07/20 0445  NA 137 136  K 4.5 4.3  CL 104 103  CO2 23 24  GLUCOSE 122* 100*  BUN 19 14  CREATININE 1.09 0.92  CALCIUM 8.4* 8.9  MG 2.1 2.0    Liver Function Tests Recent Labs    02/05/20 0603 02/06/20 0440  AST 53* 42*  ALT 220* 176*  ALKPHOS 71 70  BILITOT 1.5* 1.4*  PROT 5.2* 5.4*  ALBUMIN 2.7* 2.9*   No results for input(s): LIPASE, AMYLASE in the last 72 hours. Cardiac Enzymes No results for input(s): CKTOTAL, CKMB, CKMBINDEX, TROPONINI in the last 72 hours.  BNP: BNP (last 3 results) Recent Labs    12/02/19 1146 12/14/19 1048 02/02/20 1343  BNP 656.0* 438.6* 2,899.9*    ProBNP (last 3 results) Recent Labs    07/26/19 1102  PROBNP 992*     D-Dimer No results for input(s): DDIMER in the last 72 hours. Hemoglobin A1C No results for input(s): HGBA1C in the last 72 hours. Fasting Lipid Panel No results for input(s): CHOL, HDL, LDLCALC, TRIG, CHOLHDL, LDLDIRECT in the last 72 hours. Thyroid Function Tests No results for input(s): TSH, T4TOTAL, T3FREE, THYROIDAB in the last 72 hours.  Invalid input(s): FREET3  Other results:   Imaging    No results found.   Medications:     Scheduled Medications: . ARIPiprazole  10 mg Oral Daily  . atorvastatin  80 mg Oral q1800  . Chlorhexidine Gluconate Cloth  6 each Topical Daily  . clonazePAM  0.5 mg Oral BID  . digoxin  0.125 mg Oral Daily  . feeding supplement (GLUCERNA SHAKE)  237 mL Oral TID BM  . folic acid  1 mg Oral Daily  . furosemide  40 mg Oral Daily  . insulin aspart  0-6 Units Subcutaneous TID WC  . levothyroxine  125 mcg Oral QAC breakfast  . multivitamin with minerals  1 tablet Oral Daily  . pantoprazole  40 mg Oral Daily  . sodium chloride flush  10-40 mL Intracatheter Q12H  . spironolactone  12.5 mg Oral Daily    Infusions: . heparin 2,300 Units/hr (02/07/20 0636)  . milrinone 0.25 mcg/kg/min (02/07/20 0431)    PRN Medications: albuterol, sodium chloride flush     Assessment/Plan   1. Cardiogenic Shock  - Has ICD  - ECHO this admit EF 10% with RV mild moderately reduce. In 2020 EF 25-30%  - Lactic Acid trending  down 2.9>2.1>1.7  - CO-OX improved on milrinone 0.375 mcg. CO-OX 57%    - CVP trending back up 11-12.  - Give 40 mg IV lasix x1.  - Hold beta blocker due to low output state.  - Continue digoxin 0.125 - Continue spiro 12.5 - BP too low for ARB/ARNI - Renal function stable.  - Shock improved improved with milrinone. He is failing milrinone wean and appears inotrope dependent. Thus I have discussed the possibility of VAD placement with him. He said he would be interested in VAD support if he needed it. How he has very limited social support and says he can only rely on his neighbors. We will continue to adjust his HF meds and may initiate the VAD process to see if he is a candidate early next week depending on further discussions with him. - VAD Team consulted   2. CAD s/p STEMI, PCI 01/2019 - No chest pain.  - Continue plavix + statin.  - no ASA given concomitant anticoagulation.  - plavix stopped 3/28. Given possibility of VAD  3. History of LV Thrombus - On Xarelto as outpatient.  - On heparin drip for possible invasive hemodynamics this admission. No bleeding  4. AKI  - In the setting of low output HF.   - Creatinine peaked at 1.5 Resolved  5. Hypothyroidism  - On levothyroxine.  6. AMS - CT of head was negative.  - UDS negative.  - CO-OX improved on higher dose of milrinone but remains confused?   7. Hypokalemia -Stable   8. Hypomagnesemia - Mag 2.1    Length of Stay: 5  Amy Clegg, NP  02/07/2020, 10:26 AM  Advanced Heart Failure Team Pager [phone] (M-F; 7a - 4p)  Please contact [facility] for night-coverage after hours (4p -7a ) and weekends on [email or web address]  Patient seen and examined with the above-signed Advanced Practice Provider and/or Housestaff. I personally reviewed laboratory data, imaging studies and relevant notes. I independently examined the patient and formulated the important aspects of the plan. I have edited the note to reflect any of my changes  or salient points. I have personally discussed the plan with the patient and/or family.  Milrinone increased yesterday due to recurrent low output. I was hoping that with improved cardiac output confusion may improve. But co-ox up to 57% and he remains very confused. Volume status also climbing.   General:  Confused ill appearing  HEENT: normal Neck: supple. JVP to jaw Carotids 2+ bilat; no bruits. No lymphadenopathy or thryomegaly appreciated. Cor: PMI nondisplaced. Regular rate & rhythm. +s3 Lungs: clear Abdomen: soft, nontender, nondistended. No hepatosplenomegaly. No  bruits or masses. Good bowel sounds. Extremities: no cyanosis, clubbing, rash, trace edema Neuro: confused nonfocal    I am increasingly worried about him. He is clearly inotrope dependent from a hemodynamic standpoint but social situation and mental status prohibits currently. Volume overloaded on exam. Restart IV lasix. We will continue to follow along but suspect Palliative Care may be most appropriate at this time.   Glori Bickers, MD  3:56 PM

## 2020-02-07 NOTE — Progress Notes (Signed)
[place] for Heparin (without bolus) Indication: History of Mural Thrombus  No Known Allergies  Patient Measurements: Height: 5\' 10"  (177.8 cm) Weight: 213 lb 6.4 oz (96.8 kg) IBW/kg (Calculated) : 73 Heparin Dosing Weight:  91.9 kg  Vital Signs: Temp: 98.3 F (36.8 C) (03/30 0300) Temp Source: Oral (03/30 0300) BP: 103/65 (03/30 0300) Pulse Rate: 85 (03/30 0300)  Labs: Recent Labs    02/05/20 0603 02/05/20 0603 02/06/20 0440 02/06/20 1627 02/07/20 0445  HGB 11.0*   < > 10.5*  --  10.2*  HCT 35.3*  --  33.2*  --  32.6*  PLT 142*  --  176  --  182  HEPARINUNFRC 0.36   < > 0.20* 0.29* 0.11*  CREATININE 0.80  --  1.09  --  0.92   < > = values in this interval not displayed.    Estimated Creatinine Clearance: 98.4 mL/min (by C-G formula based on SCr of 0.92 mg/dL).   Medical History: Past Medical History:  Diagnosis Date  . Barrett's esophagus 04/21/2018  . CAD (coronary artery disease)    a. s/p STEMI in 01/2019 with DES to proximal LAD  . CHF (congestive heart failure) ([place])    a. EF 35-40% by echo in 01/2019, at 25-30% by repeat echo in 05/2019 and 08/2019  . Depression   . High cholesterol   . Hypertension   . Hypothyroid   . Sleep apnea    wears CPAP at home   Assessment: 64 yr old male with hx of ischemic cardiomyopathy S/P STEMI and PCI in 01/2019, HFrEF (last EF 25-30% in 08/2019), HTN, HLD, OSA, and depression presented with AMS and potential cardiogenic shock. Pt was previously on Xarelto for mural thrombus, with unknown compliance (not on current med list, but pt told RN he was taking Xarelto; however, pt is confused). Pharmacy is consulted to dose heparin for history of mural thrombus.   Heparin level low this AM at 0.11  Goal of Therapy:  Heparin level 0.3-0.7 units/ml Monitor platelets by anticoagulation protocol: Yes   Plan:  -Increase heparin to 2300 units / hr -Check  level in 6 hours -Daily HL with  CBC  -Monitor for bleeding   Thank you,  Anette Guarneri, PharmD

## 2020-02-07 NOTE — Progress Notes (Signed)
Friend at bedside has been requested by patient to take his wallet home. Patient has confirmed friend as [name]. Mr Tony Alvarez has patients wallet in hand and will take said wallet home.

## 2020-02-08 LAB — COMPREHENSIVE METABOLIC PANEL
ALT: 111 U/L — ABNORMAL HIGH (ref 0–44)
AST: 32 U/L (ref 15–41)
Albumin: 2.8 g/dL — ABNORMAL LOW (ref 3.5–5.0)
Alkaline Phosphatase: 60 U/L (ref 38–126)
Anion gap: 8 (ref 5–15)
BUN: 11 mg/dL (ref 8–23)
CO2: 27 mmol/L (ref 22–32)
Calcium: 9.3 mg/dL (ref 8.9–10.3)
Chloride: 100 mmol/L (ref 98–111)
Creatinine, Ser: 0.9 mg/dL (ref 0.61–1.24)
GFR calc Af Amer: >60 mL/min (ref >60)
GFR calc non Af Amer: >60 mL/min (ref >60)
Glucose, Bld: 100 mg/dL — ABNORMAL HIGH (ref 70–99)
Potassium: 3.9 mmol/L (ref 3.5–5.1)
Sodium: 135 mmol/L (ref 135–145)
Total Bilirubin: 1.2 mg/dL (ref 0.3–1.2)
Total Protein: 5.1 g/dL — ABNORMAL LOW (ref 6.5–8.1)

## 2020-02-08 LAB — IRON AND TIBC
Iron: 25 ug/dL — ABNORMAL LOW (ref 45–182)
Saturation Ratios: 8 % — ABNORMAL LOW (ref 17.9–39.5)
TIBC: 318 ug/dL (ref 250–450)
UIBC: 293 ug/dL

## 2020-02-08 LAB — MAGNESIUM: Magnesium: 2 mg/dL (ref 1.7–2.4)

## 2020-02-08 LAB — COOXEMETRY PANEL
Carboxyhemoglobin: 1.3 % (ref 0.5–1.5)
Methemoglobin: 1 % (ref 0.0–1.5)
O2 Saturation: 53.8 %
Total hemoglobin: 10.8 g/dL — ABNORMAL LOW (ref 12.0–16.0)

## 2020-02-08 LAB — GLUCOSE, CAPILLARY
Glucose-Capillary: 92 mg/dL (ref 70–99)
Glucose-Capillary: 103 mg/dL — ABNORMAL HIGH (ref 70–99)
Glucose-Capillary: 111 mg/dL — ABNORMAL HIGH (ref 70–99)
Glucose-Capillary: 93 mg/dL (ref 70–99)

## 2020-02-08 LAB — CBC
HCT: 33.8 % — ABNORMAL LOW (ref 39.0–52.0)
Hemoglobin: 10.5 g/dL — ABNORMAL LOW (ref 13.0–17.0)
MCH: 27.7 pg (ref 26.0–34.0)
MCHC: 31.1 g/dL (ref 30.0–36.0)
MCV: 89.2 fL (ref 80.0–100.0)
Platelets: 221 10*3/uL (ref 150–400)
RBC: 3.79 MIL/uL — ABNORMAL LOW (ref 4.22–5.81)
RDW: 17.5 % — ABNORMAL HIGH (ref 11.5–15.5)
WBC: 9 10*3/uL (ref 4.0–10.5)
nRBC: 0.4 % — ABNORMAL HIGH (ref 0.0–0.2)

## 2020-02-08 LAB — HEPARIN LEVEL (UNFRACTIONATED)
Heparin Unfractionated: 0.49 IU/mL (ref 0.30–0.70)
Heparin Unfractionated: 0.66 IU/mL (ref 0.30–0.70)

## 2020-02-08 LAB — FERRITIN: Ferritin: 49 ng/mL (ref 24–336)

## 2020-02-08 LAB — VITAMIN B12: Vitamin B-12: 1077 pg/mL — ABNORMAL HIGH (ref 180–914)

## 2020-02-08 MED ORDER — FUROSEMIDE 80 MG PO TABS
80.0000 mg | ORAL_TABLET | Freq: Every day | ORAL | Status: DC
  Administered 2020-02-09 – 2020-02-14 (×6): 80 mg via ORAL
  Filled 2020-02-08 (×6): qty 1

## 2020-02-08 MED ORDER — SODIUM CHLORIDE 0.9 % IV SOLN
510.0000 mg | Freq: Once | INTRAVENOUS | Status: AC
  Administered 2020-02-08: 510 mg via INTRAVENOUS
  Filled 2020-02-08: qty 17

## 2020-02-08 NOTE — Progress Notes (Signed)
Nutrition Follow-up  DOCUMENTATION CODES:   Not applicable  INTERVENTION:   -Continue Glucerna Shake po TID, each supplement provides 220 kcal and 10 grams of protein -Continue MVI with minerals daily  NUTRITION DIAGNOSIS:   Increased nutrient needs related to chronic illness(CHF) as evidenced by estimated needs.  Ongoing  GOAL:   Patient will meet greater than or equal to 90% of their needs  Progressing   MONITOR:   PO intake, Supplement acceptance, Diet advancement, Labs, Weight trends, Skin, I & O's  REASON FOR ASSESSMENT:   Malnutrition Screening Tool    ASSESSMENT:   Tony Alvarez is a 64 year old M with significant PMH of ischemic cardiomyopathy s/p STEMI and PCI in 01/2019, HFrEF (last EF 25-30% in 08/2019), HTN, HLD, OSA, and depression who presented with altered mental status. Pt seen at the bedside and intermittently following commands and answering questions. Says he is in "hospital," but otherwise appears to only respond by nodding head to indicate yes/no. Rest of history obtained per chart review. Pt found this afternoon by a neighbor in his home. Was hyperventilating and confused. ED provider spoke with sister, Cleda Clarks, who lives in [place] and had not spoken to the patient in 6 days. EMS initially called a STEMI, but cancelled after no change from prior EKG noted. Pt was tachycardic, RR >30 on 4L [place]. Given 324 ASA and 171mL NS bolus by EMS.  3/26- s/p BSE- advanced to regular diet, PICC placed, lasix d/c 3/28- PICC line pulled 3/29- PICC placed  Reviewed I/O's: -1.3 L x 24 hours and -2.8 L since admission  UOP: 2.3 L x 24 hours  Pt remains confused. He was resting quietly at time of visit. RD did not disturb.   Pt with good appetite; noted meal completion 75-100%. He is consuming Glucerna shakes.   Per MD notes, plan for palliative care consult. He is currently not appropriate for VAD per Advanced Heart Failure Team. Palliative care consult is being  considered. Per HF team, unsure if pt can be managed effectively without IV inotropes.   Labs reviewed: CBGS: 92-111 (inpatient orders for glycemic control are 0-6 units insulin aspart TID with meals).   Diet Order:   Diet Order            Diet regular Room service appropriate? Yes with Assist; Fluid consistency: Thin  Diet effective now              EDUCATION NEEDS:   No education needs have been identified at this time  Skin:  Skin Assessment: Skin Integrity Issues: Skin Integrity Issues:: Other (Comment) Other: MASD to scrotum and buttocks  Last BM:  02/04/20  Height:   Ht Readings from Last 1 Encounters:  02/02/20 5\' 10"  (1.778 m)    Weight:   Wt Readings from Last 1 Encounters:  02/08/20 93.3 kg    Ideal Body Weight:  75.5 kg  BMI:  Body mass index is 29.5 kg/m.  Estimated Nutritional Needs:   Kcal:  2050-2250  Protein:  110-125 grams  Fluid:  > 2 L    Loistine Chance, RD, LDN, [place] Registered Dietitian II Certified Diabetes Care and Education Specialist Please refer to [facility] for RD and/or RD on-call/weekend/after hours pager

## 2020-02-08 NOTE — TOC Initial Note (Signed)
Transition of Care [facility]) - Initial/Assessment Note    Patient Details  Name: Tony Alvarez MRN: [id number] Date of Birth: Aug 22, 1956  Transition of Care [facility]) CM/SW Contact:    Zenon Mayo, RN Phone Number: 02/08/2020, 6:08 PM  Clinical Narrative:                  AMS and cardiogenic shock, conts on milrinone and hep drip, co- ox is up ,conts to be confused, will restart iv lasix, palliative consulted.  TOC will cont to follow for dc needs.  Expected Discharge Plan: [place] Barriers to Discharge: Continued Medical Work up   Patient Goals and CMS Choice        Expected Discharge Plan and Services Expected Discharge Plan: [place]   Discharge Planning Services: CM Consult   Living arrangements for the past 2 months: Single Family Home                                      Prior Living Arrangements/Services Living arrangements for the past 2 months: Single Family Home Lives with:: Self Patient language and need for interpreter reviewed:: Yes              Criminal Activity/Legal Involvement Pertinent to Current Situation/Hospitalization: No - Comment as needed  Activities of Daily Living      Permission Sought/Granted                  Emotional Assessment       Orientation: : Oriented to Place, Oriented to Self Alcohol / Substance Use: Not Applicable Psych Involvement: No (comment)  Admission diagnosis:  Acute exacerbation of CHF (congestive heart failure) (HCC) [I50.9] Acute on chronic systolic congestive heart failure (HCC) [I50.23] AKI (acute kidney injury) ([place]) [N17.9] Patient Active Problem List   Diagnosis Date Noted  . Transaminitis   . Cardiogenic shock ([place])   . Acute encephalopathy   . CHF (congestive heart failure) ([place]) 11/13/2019  . Acute respiratory failure with hypoxia ([place]) 11/12/2019  . CKD (chronic kidney disease) stage 3, GFR 30-59 ml/min 11/12/2019  . Acute exacerbation  of CHF (congestive heart failure) ([place]) 10/22/2019  . CHF exacerbation ([place]) 10/17/2019  . Acute and chronic respiratory failure (acute-on-chronic) ([place]) 09/11/2019  . Sleep apnea   . Hypertension   . AKI (acute kidney injury) ([place])   . CAD (coronary artery disease) 06/24/2019  . Hypothyroid   . COPD (chronic obstructive pulmonary disease) ([place])   . Depression   . Hyperlipidemia 01/30/2019  . LV (left ventricular) mural thrombus with acute MI ([place]) 01/30/2019  . Alcohol abuse 01/30/2019  . Alcohol withdrawal delirium ([place]) 01/30/2019  . Elevated liver enzymes 01/30/2019  . Ischemic cardiomyopathy 01/30/2019  . Acute on chronic systolic CHF (congestive heart failure) ([place]) 01/30/2019  . History of colonic polyps 04/21/2018  . Barrett's esophagus 04/21/2018  . Barrett's esophagus without dysplasia 04/21/2018  . Rectal bleeding 04/21/2018   PCP:  A, [place]:   [facility] [address], [place] - [place] [id number] [place] [address] Phone: [phone] Fax: [phone]     Social Determinants of Health (SDOH) Interventions    Readmission Risk Interventions Readmission Risk Prevention Plan 10/18/2019  Transportation Screening Complete  Social Work Consult for [place] Planning/Counseling [place] Not Applicable  Medication Review (RN  Care Manager) Complete  Some recent data might be hidden

## 2020-02-08 NOTE — Progress Notes (Signed)
Subjective: Pt seen at the bedside this morning. Alert, oriented to place. Pt answering questions appropriately, but continues to have impaired memory regarding his hospitalization. Discussed pt's current heart failure treatment and recommendation for palliative care consult given limited options. Pt responds, "that makes sense."  Objective:  Vital signs in last 24 hours: Vitals:   02/07/20 1400 02/07/20 1500 02/07/20 1624 02/07/20 2040  BP: 116/86  106/64 108/73  Pulse:   85 84  Resp: (!) 26 20 20  (!) 21  Temp:   97.7 F (36.5 C) 97.8 F (36.6 C)  TempSrc:   Oral Oral  SpO2: 93% 96% 97% 92%  Weight:      Height:       Physical Exam Vitals and nursing note reviewed.  Constitutional:      General: He is not in acute distress.    Appearance: He is ill-appearing.  Cardiovascular:     Rate and Rhythm: Normal rate and regular rhythm.  Pulmonary:     Comments: On 4L with O2 saturation 90-93%. Normal respiratory effort. Bibasilar crackles. Musculoskeletal:     Comments: Trace bilateral pitting edema.  Skin:    General: Skin is warm and dry.  Psychiatric:        Speech: Speech is delayed.        Behavior: Behavior is slowed.        Cognition and Memory: Memory is impaired.    Assessment/Plan:  Principal Problem:   Cardiogenic shock (HCC) Active Problems:   Acute exacerbation of CHF (congestive heart failure) (HCC)   Acute encephalopathy   Transaminitis  64yo male with PMH medical history of ischemic cardiomyopathy status post STEMI and PCI in March 2020, HFrEF, hypertension, hyperlipidemia, OSA, and depression who presented with altered mental status, LE edema, and elevated BNP.   Cardiogenic Shock Acute HFrEF Exacerbation- EF 10% with RV mild to moderately reduced Pt remains inotrope dependent on milrinone. Given 40mg  IV and 40mg  PO lasix yesterday due to worsening volume status. Net -1.3L with 2.3L urine output. Question accuracy of weights given pt down 3.5kg. Labs  stable, Cr 0.9, K 3.9, and Mg 2.0. - Co-ox 54 - CVP 5 Advanced heart failure consulted, appreciate their input in this case - inotrope dependent, but social situation and mental status prohibiting more invasive therapies - restart IV lasix given worsening volume status - Palliative consult would be appropriate at this time - on milrinone 0.375 - continue digoxin 0.125 and spiro 12.5 - holding b-blocker in setting of acute HF - BP too low for ARB/ARNI - keep mg >2, K > 4 - strict I/O, need daily weights  Palliative care consulted.  AMS MDD Mental status continues to wax and wane. Psych eval, recommending to continue Abilify 10mg  daily and Klonopin 1mg  daily inpatient with plans for outpatient taper and titrate on an SSRI. - suspect underlying etiology remains low output heart failure, though mental status did not improve with increased milrinone dose and pt remains confused   Hx of LV Thrombus Pt on heparin drip, instead of home Xarelto, pending potential need for inpatient procedure.   Normocytic Anemia  Hgb stable at 10.5 this AM. Hgb was 14.3 on admission, and per chart review had fluctuated between 12-14 over last 4 months. - B12 high at 1,077, MMA pending - ferritin 49, iron 25, TIBC 318, with sat ratio low at 8 - will give IV iron today  CAD s/p STEMI, PCI 01/2019 w LAD stent - plavix stopped on 3/28 for potential  VAD - continue atorvastatin  Prior to Admission Living Arrangement: home Anticipated Discharge Location: home vs SNF vs hospice Barriers to Discharge: clinical improvement Dispo: Anticipated discharge pending clinical course  Ladona Horns, MD 02/08/2020, 6:15 AM Pager: [phone]

## 2020-02-08 NOTE — Progress Notes (Signed)
[place] for Heparin (without bolus) Indication: History of Mural Thrombus  No Known Allergies  Patient Measurements: Height: 5\' 10"  (177.8 cm) Weight: 205 lb 9.6 oz (93.3 kg) IBW/kg (Calculated) : 73 Heparin Dosing Weight:  91.9 kg  Vital Signs: Temp: 98.6 F (37 C) (03/31 0611) Temp Source: Oral (03/31 0611) BP: 106/74 (03/31 [id number]) Pulse Rate: 84 (03/30 2040)  Labs: Recent Labs    02/06/20 0440 02/06/20 1627 02/07/20 0445 02/07/20 1330 02/07/20 1558 02/08/20 0113 02/08/20 0505  HGB 10.5*  --  10.2*  --   --   --  10.5*  HCT 33.2*  --  32.6*  --   --   --  33.8*  PLT 176  --  182  --   --   --  221  HEPARINUNFRC 0.20*   < > 0.11*   < > 0.99* 0.49 0.66  CREATININE 1.09  --  0.92  --   --   --  0.90   < > = values in this interval not displayed.    Estimated Creatinine Clearance: 98.9 mL/min (by C-G formula based on SCr of 0.9 mg/dL).   Medical History: Past Medical History:  Diagnosis Date  . Barrett's esophagus 04/21/2018  . CAD (coronary artery disease)    a. s/p STEMI in 01/2019 with DES to proximal LAD  . CHF (congestive heart failure) ([place])    a. EF 35-40% by echo in 01/2019, at 25-30% by repeat echo in 05/2019 and 08/2019  . Depression   . High cholesterol   . Hypertension   . Hypothyroid   . Sleep apnea    wears CPAP at home   Assessment: 64 yr old male with hx of ischemic cardiomyopathy S/P STEMI and PCI in 01/2019, HFrEF (last EF 25-30% in 08/2019), HTN, HLD, OSA, and depression presented with AMS and potential cardiogenic shock. Pt was previously on Xarelto for mural thrombus with unknown compliance (not on current med list, but pt told RN he was taking Xarelto; however, pt is confused). Pharmacy is consulted to dose heparin for history of mural thrombus.   Heparin level is therapeutic at 0.66, on 2050 units/hr. Hgb 10.5, plt 221. No s/sx of bleeding or infusion issues.   Goal of Therapy:  Heparin level  0.3-0.7 units/ml Monitor platelets by anticoagulation protocol: Yes   Plan:  Continue heparin infusion at 2050 units / hr  Monitor daily heparin level and CBC, s/sx bleeding F/u long-term plan for anticoagulation  Thank you  Antonietta Jewel, PharmD, BCCCP Clinical Pharmacist  Phone: [phone]  Please check AMION for all [place] phone numbers After 10:00 PM, call [place] [phone] 02/08/2020 7:28 AM

## 2020-02-08 NOTE — Progress Notes (Addendum)
Advanced Heart Failure Rounding Note  PCP-Cardiologist: Shelva Majestic, MD   Subjective:    Echo EF 10% RV mild to moderately down   Remains on milrinone 0.25 mcg + heparin drip. CO-OX 54%   He has been trying to pull out his PICC. Has sitter at bedside.   Denies SOB.    Objective:   Weight Range: 93.3 kg Body mass index is 29.5 kg/m.   Vital Signs:   Temp:  [97.7 F (36.5 C)-98.6 F (37 C)] 98.2 F (36.8 C) (03/31 0800) Pulse Rate:  [84-85] 84 (03/30 2040) Resp:  [17-26] 20 (03/31 0800) BP: (104-121)/(64-95) 104/73 (03/31 0924) SpO2:  [92 %-98 %] 93 % (03/31 0800) Weight:  [93.3 kg] 93.3 kg (03/31 0600) Last BM Date: 02/04/20  Weight change: Filed Weights   02/06/20 0125 02/07/20 0300 02/08/20 0600  Weight: 97.2 kg 96.8 kg 93.3 kg    Intake/Output:   Intake/Output Summary (Last 24 hours) at 02/08/2020 1151 Last data filed at 02/08/2020 1010 Gross per 24 hour  Intake 1060 ml  Output 2900 ml  Net -1840 ml      Physical Exam  CVP 2  General:   No resp difficulty HEENT: normal Neck: supple. no JVD. Carotids 2+ bilat; no bruits. No lymphadenopathy or thryomegaly appreciated. Cor: PMI nondisplaced. Regular rate & rhythm. No rubs, gallops or murmurs. Lungs: clear Abdomen: soft, nontender, nondistended. No hepatosplenomegaly. No bruits or masses. Good bowel sounds. Extremities: no cyanosis, clubbing, rash, edema. RUE PICC Neuro: alert & oriented 1 self. cranial nerves grossly intact. moves all 4 extremities w/o difficulty. Affect flat   Telemetry   SR 80-90s     Labs    CBC Recent Labs    02/07/20 0445 02/08/20 0505  WBC 8.5 9.0  HGB 10.2* 10.5*  HCT 32.6* 33.8*  MCV 87.9 89.2  PLT 182 [id number]   Basic Metabolic Panel Recent Labs    02/07/20 0445 02/08/20 0505  NA 136 135  K 4.3 3.9  CL 103 100  CO2 24 27  GLUCOSE 100* 100*  BUN 14 11  CREATININE 0.92 0.90  CALCIUM 8.9 9.3  MG 2.0 2.0   Liver Function Tests Recent Labs   02/07/20 0445 02/08/20 0505  AST 33 32  ALT 139* 111*  ALKPHOS 64 60  BILITOT 1.5* 1.2  PROT 5.4* 5.1*  ALBUMIN 2.8* 2.8*   No results for input(s): LIPASE, AMYLASE in the last 72 hours. Cardiac Enzymes No results for input(s): CKTOTAL, CKMB, CKMBINDEX, TROPONINI in the last 72 hours.  BNP: BNP (last 3 results) Recent Labs    12/02/19 1146 12/14/19 1048 02/02/20 1343  BNP 656.0* 438.6* 2,899.9*    ProBNP (last 3 results) Recent Labs    07/26/19 1102  PROBNP 992*     D-Dimer No results for input(s): DDIMER in the last 72 hours. Hemoglobin A1C No results for input(s): HGBA1C in the last 72 hours. Fasting Lipid Panel No results for input(s): CHOL, HDL, LDLCALC, TRIG, CHOLHDL, LDLDIRECT in the last 72 hours. Thyroid Function Tests No results for input(s): TSH, T4TOTAL, T3FREE, THYROIDAB in the last 72 hours.  Invalid input(s): FREET3  Other results:   Imaging    No results found.   Medications:     Scheduled Medications: . ARIPiprazole  10 mg Oral Daily  . atorvastatin  80 mg Oral q1800  . Chlorhexidine Gluconate Cloth  6 each Topical Daily  . clonazePAM  0.5 mg Oral BID  . digoxin  0.125 mg  Oral Daily  . feeding supplement (GLUCERNA SHAKE)  237 mL Oral TID BM  . folic acid  1 mg Oral Daily  . furosemide  40 mg Oral Daily  . insulin aspart  0-6 Units Subcutaneous TID WC  . levothyroxine  125 mcg Oral QAC breakfast  . multivitamin with minerals  1 tablet Oral Daily  . pantoprazole  40 mg Oral Daily  . sodium chloride flush  10-40 mL Intracatheter Q12H  . spironolactone  12.5 mg Oral Daily    Infusions: . ferumoxytol    . heparin 2,050 Units/hr (02/08/20 [id number])  . milrinone 0.25 mcg/kg/min (02/08/20 0928)    PRN Medications: albuterol, sodium chloride flush     Assessment/Plan   1. Cardiogenic Shock  - Has ICD  - ECHO this admit EF 10% with RV mild moderately reduce. In 2020 EF 25-30%  - Lactic Acid trending down 2.9>2.1>1.7  - CO-OX  54% .Increase milrinone to 0.375 mcg.  - CVP 2. Volume status stable. Already had iV lasix. Stop IV lasix. Tomorrow start lasix 80 mg po daily.   - Hold beta blocker due to low output state.  - Continue digoxin 0.125 - Continue spiro 12.5 - BP too low for ARB/ARNI - Renal function stable.  - Shock improved improved with milrinone. He is failing milrinone wean and appears inotrope dependent. Thus I have discussed the possibility of VAD placement with him. He said he would be interested in VAD support if he needed it. How he has very limited social support and says he can only rely on his neighbors. We will continue to adjust his HF meds and may initiate the VAD process to see if he is a candidate early next week depending on further discussions with him. - VAD Team consulted   2. CAD s/p STEMI, PCI 01/2019 - No chest pain.   - Continue plavix + statin.  - no ASA given concomitant anticoagulation.  - plavix stopped 3/28. Given possibility of VAD  3. History of LV Thrombus - On Xarelto as outpatient.  - On heparin drip for possible invasive hemodynamics this admission. No bleeding  4. AKI  - In the setting of low output HF.   - Creatinine peaked at 1.5 - Resolved.   5. Hypothyroidism  - On levothyroxine.  6. AMS - CT of head was negative.  - UDS negative.  - CO-OX improved on higher dose of milrinone but remains confused?  - Has h/o major depressive disorder.   7. Hypokalemia -Stable   8. Hypomagnesemia - Mag 2  VAD currently not an option with AMS. Unable to complete VAD work up.   May need pysch consult.   Length of Stay: 6  Amy Clegg, NP  02/08/2020, 11:51 AM  Advanced Heart Failure Team Pager [phone] (M-F; 7a - 4p)  Please contact [facility] for night-coverage after hours (4p -7a ) and weekends on [email or web address]   Patient seen and examined with the above-signed Advanced Practice Provider and/or Housestaff. I personally reviewed laboratory data, imaging studies  and relevant notes. I independently examined the patient and formulated the important aspects of the plan. I have edited the note to reflect any of my changes or salient points. I have personally discussed the plan with the patient and/or family.  Continue to struggle with severe encephalopathy of unclear/multifactorial etiology. Unable to tell me where he is or which town this is or if he is in a hospital.   Remains on milrinone. Co-ox low but not profoundly  so. Volume status now improved.   Exam General:  Disshelved confused No resp difficulty HEENT: normal Neck: supple. no JVD. Carotids 2+ bilat; no bruits. No lymphadenopathy or thryomegaly appreciated. Cor: PMI nondisplaced. Regular rate & rhythm. +s3 Lungs: clear Abdomen: soft, nontender, nondistended. No hepatosplenomegaly. No bruits or masses. Good bowel sounds. Extremities: no cyanosis, clubbing, rash, edema Neuro: confused. Moves all 4 extremities without problem   Unfortunately his encephalopathy persists. Etiology remains unclear but likely multifactorial and certainly seems out of proportion to his cardiac output at this time. Will increase milrinone slightly to see if this helps but I am not optimistic. Agree with IM team that Palliative Care is likely best/only realistic option at this point.   Glori Bickers, MD  11:08 PM

## 2020-02-08 NOTE — Progress Notes (Signed)
[place] for Heparin (without bolus) Indication: History of Mural Thrombus  No Known Allergies  Patient Measurements: Height: 5\' 10"  (177.8 cm) Weight: 213 lb 6.4 oz (96.8 kg) IBW/kg (Calculated) : 73 Heparin Dosing Weight:  91.9 kg  Vital Signs: Temp: 97.8 F (36.6 C) (03/30 2040) Temp Source: Oral (03/30 2040) BP: 108/73 (03/30 2040) Pulse Rate: 84 (03/30 2040)  Labs: Recent Labs    02/05/20 0603 02/05/20 0603 02/06/20 0440 02/06/20 1627 02/07/20 0445 02/07/20 0445 02/07/20 1330 02/07/20 1558 02/08/20 0113  HGB 11.0*   < > 10.5*  --  10.2*  --   --   --   --   HCT 35.3*  --  33.2*  --  32.6*  --   --   --   --   PLT 142*  --  176  --  182  --   --   --   --   HEPARINUNFRC 0.36   < > 0.20*   < > 0.11*   < > 1.04* 0.99* 0.49  CREATININE 0.80  --  1.09  --  0.92  --   --   --   --    < > = values in this interval not displayed.    Estimated Creatinine Clearance: 98.4 mL/min (by C-G formula based on SCr of 0.92 mg/dL).   Medical History: Past Medical History:  Diagnosis Date  . Barrett's esophagus 04/21/2018  . CAD (coronary artery disease)    a. s/p STEMI in 01/2019 with DES to proximal LAD  . CHF (congestive heart failure) ([place])    a. EF 35-40% by echo in 01/2019, at 25-30% by repeat echo in 05/2019 and 08/2019  . Depression   . High cholesterol   . Hypertension   . Hypothyroid   . Sleep apnea    wears CPAP at home   Assessment: 64 yr old male with hx of ischemic cardiomyopathy S/P STEMI and PCI in 01/2019, HFrEF (last EF 25-30% in 08/2019), HTN, HLD, OSA, and depression presented with AMS and potential cardiogenic shock. Pt was previously on Xarelto for mural thrombus with unknown compliance (not on current med list, but pt told RN he was taking Xarelto; however, pt is confused). Pharmacy is consulted to dose heparin for history of mural thrombus.   Heparin level now therapeutic at 0.49  Goal of Therapy:  Heparin  level 0.3-0.7 units/ml Monitor platelets by anticoagulation protocol: Yes   Plan:  Continue heparin at 2050 units / hr Monitor daily heparin level and CBC, s/sx bleeding F/u long-term plan for anticoagulation  Thank you Anette Guarneri, PharmD 02/08/2020 1:46 AM

## 2020-02-09 DIAGNOSIS — Z515 Encounter for palliative care: Secondary | ICD-10-CM

## 2020-02-09 DIAGNOSIS — I5043 Acute on chronic combined systolic (congestive) and diastolic (congestive) heart failure: Secondary | ICD-10-CM

## 2020-02-09 DIAGNOSIS — R5383 Other fatigue: Secondary | ICD-10-CM

## 2020-02-09 DIAGNOSIS — Z66 Do not resuscitate: Secondary | ICD-10-CM

## 2020-02-09 DIAGNOSIS — I5023 Acute on chronic systolic (congestive) heart failure: Secondary | ICD-10-CM

## 2020-02-09 LAB — CBC
HCT: 29.4 % — ABNORMAL LOW (ref 39.0–52.0)
Hemoglobin: 9.3 g/dL — ABNORMAL LOW (ref 13.0–17.0)
MCH: 27.8 pg (ref 26.0–34.0)
MCHC: 31.6 g/dL (ref 30.0–36.0)
MCV: 87.8 fL (ref 80.0–100.0)
Platelets: 212 10*3/uL (ref 150–400)
RBC: 3.35 MIL/uL — ABNORMAL LOW (ref 4.22–5.81)
RDW: 17.2 % — ABNORMAL HIGH (ref 11.5–15.5)
WBC: 6.3 10*3/uL (ref 4.0–10.5)
nRBC: 0.5 % — ABNORMAL HIGH (ref 0.0–0.2)

## 2020-02-09 LAB — GLUCOSE, CAPILLARY
Glucose-Capillary: 88 mg/dL (ref 70–99)
Glucose-Capillary: 108 mg/dL — ABNORMAL HIGH (ref 70–99)
Glucose-Capillary: 115 mg/dL — ABNORMAL HIGH (ref 70–99)
Glucose-Capillary: 120 mg/dL — ABNORMAL HIGH (ref 70–99)
Glucose-Capillary: 103 mg/dL — ABNORMAL HIGH (ref 70–99)

## 2020-02-09 LAB — BASIC METABOLIC PANEL
Anion gap: 9 (ref 5–15)
BUN: 12 mg/dL (ref 8–23)
CO2: 24 mmol/L (ref 22–32)
Calcium: 8.7 mg/dL — ABNORMAL LOW (ref 8.9–10.3)
Chloride: 97 mmol/L — ABNORMAL LOW (ref 98–111)
Creatinine, Ser: 0.87 mg/dL (ref 0.61–1.24)
GFR calc Af Amer: >60 mL/min (ref >60)
GFR calc non Af Amer: >60 mL/min (ref >60)
Glucose, Bld: 253 mg/dL — ABNORMAL HIGH (ref 70–99)
Potassium: 3.6 mmol/L (ref 3.5–5.1)
Sodium: 130 mmol/L — ABNORMAL LOW (ref 135–145)

## 2020-02-09 LAB — COOXEMETRY PANEL
Carboxyhemoglobin: 1.4 % (ref 0.5–1.5)
Methemoglobin: 1.4 % (ref 0.0–1.5)
O2 Saturation: 66.7 %
Total hemoglobin: 10.4 g/dL — ABNORMAL LOW (ref 12.0–16.0)

## 2020-02-09 LAB — MAGNESIUM: Magnesium: 1.7 mg/dL (ref 1.7–2.4)

## 2020-02-09 LAB — DIGOXIN LEVEL: Digoxin Level: 0.4 ng/mL — ABNORMAL LOW (ref 0.8–2.0)

## 2020-02-09 LAB — HEPARIN LEVEL (UNFRACTIONATED)
Heparin Unfractionated: 1.34 IU/mL — ABNORMAL HIGH (ref 0.30–0.70)
Heparin Unfractionated: 1.02 IU/mL — ABNORMAL HIGH (ref 0.30–0.70)
Heparin Unfractionated: 0.94 IU/mL — ABNORMAL HIGH (ref 0.30–0.70)

## 2020-02-09 MED ORDER — SPIRONOLACTONE 25 MG PO TABS
25.0000 mg | ORAL_TABLET | Freq: Every day | ORAL | Status: DC
  Administered 2020-02-10 – 2020-02-14 (×5): 25 mg via ORAL
  Filled 2020-02-09 (×5): qty 1

## 2020-02-09 MED ORDER — SODIUM CHLORIDE 0.9 % IV SOLN
INTRAVENOUS | Status: DC | PRN
  Administered 2020-02-09: 09:00:00 250 mL via INTRAVENOUS

## 2020-02-09 MED ORDER — POTASSIUM CHLORIDE CRYS ER 20 MEQ PO TBCR
40.0000 meq | EXTENDED_RELEASE_TABLET | Freq: Once | ORAL | Status: AC
  Administered 2020-02-09: 08:00:00 40 meq via ORAL
  Filled 2020-02-09: qty 2

## 2020-02-09 MED ORDER — MAGNESIUM SULFATE 4 GM/100ML IV SOLN
4.0000 g | Freq: Once | INTRAVENOUS | Status: AC
  Administered 2020-02-09: 09:00:00 4 g via INTRAVENOUS
  Filled 2020-02-09 (×2): qty 100

## 2020-02-09 NOTE — Progress Notes (Signed)
No current orders for CVP monitoring. RN spoke with Amy, NP; verbal orders received to continue CVP monitoring every 4 hours. RN will carry out orders.

## 2020-02-09 NOTE — Progress Notes (Signed)
[facility] for Heparin (without bolus) Indication: History of Mural Thrombus 01/27/19  No Known Allergies  Patient Measurements: Height: 5\' 10"  (177.8 cm) Weight: 93.6 kg (206 lb 6.4 oz) IBW/kg (Calculated) : 73 Heparin Dosing Weight:  91.9 kg  Vital Signs: Temp: 98.6 F (37 C) (04/01 2211) Temp Source: Oral (04/01 2211) BP: 95/58 (04/01 2211) Pulse Rate: 104 (04/01 2211)  Labs: Recent Labs    02/07/20 0445 02/07/20 1330 02/08/20 0505 02/08/20 0505 02/09/20 0440 02/09/20 0530 02/09/20 1136 02/09/20 2140  HGB 10.2*  --  10.5*  --  9.3*  --   --   --   HCT 32.6*  --  33.8*  --  29.4*  --   --   --   PLT 182  --  221  --  212  --   --   --   HEPARINUNFRC 0.11*   < > 0.66   < >  --  1.34* 1.02* 0.94*  CREATININE 0.92  --  0.90  --  0.87  --   --   --    < > = values in this interval not displayed.    Estimated Creatinine Clearance: 102.4 mL/min (by C-G formula based on SCr of 0.87 mg/dL).  Assessment: 64 yr old M on heparin for hx mural thrombus 01/27/19. No LV thrombus on 02/03/20 ECHO. Other PMH includes ischemic cardiomyopathy S/P STEMI and PCI in 01/2019, HFrEF (last EF 25-30% in 08/2019), HTN, HLD, OSA, and depression. Admitted for AMS and potential cardiogenic shock. Pt was previously on Xarelto for mural thrombus with unknown compliance (not on current med list, pt told RN he was taking Xarelto; however, pt was confused. First antiXa level was normal so does not appear to have taken it recently).   Heparin level still elevated at 0.94. Heparin is running through the PICC and RN states level was drawn from PICC line after appropriate flushing. No bleeding and no issues with infusion per RN.    Goal of Therapy:  Heparin level 0.3-0.7 units/ml Monitor platelets by anticoagulation protocol: Yes   Plan:  Reduce heparin infusion to 1700 units / hr Monitor daily HL, CBC/plt Monitor for signs/symptoms of bleeding  F/u long-term plan  for anticoagulation   Benetta Spar, PharmD, BCPS, BCCP Clinical Pharmacist  Please check AMION for all [facility] phone numbers After 10:00 PM, call [place]

## 2020-02-09 NOTE — Evaluation (Signed)
Physical Therapy Evaluation Patient Details Name: Tony Alvarez MRN: [id number] DOB: 02-13-1956 Today's Date: 02/09/2020   History of Present Illness  Pt is a 64 y.o. male admitted 02/02/20 with AMS after being found by neighbor, apparently hyperventilating and confused. CT head and c-spine negative for acute abnormality. CXR with stable cardiomegaly, central pulmonary congestion, and mild bibasilar atelectasis vs edema. Workup for cardiogenic shock with acute on chronic HFrEF with diastolic dysfunction. Unclear etiology for AMS. PMH includes CAD, CHF, HTN, OSA, depression.    Clinical Impression  Pt presents with an overall decrease in functional mobility secondary to above. PTA, pt independent and lives alone. Today, pt requiring up to minA to correct LOB with ambulation; limited by generalized weakness, decreased activity tolerance, poor balance strategies/postural reactions and impaired cognition. Pt's friend visiting during session and reports he plans to move in with pt to provide initial 24/7 assist at d/c. Pt would benefit from continued acute PT services to maximize functional mobility and independence prior to d/c with HHPT services.   SpO2 down to 88% on RA with ambulation    Follow Up Recommendations Home health PT;Supervision/Assistance - 24 hour    Equipment Recommendations  None recommended by PT    Recommendations for Other Services       Precautions / Restrictions Precautions Precautions: Fall Precaution Comments: SpO2 down to 88% ambulating on RA Restrictions Weight Bearing Restrictions: No      Mobility  Bed Mobility Overal bed mobility: Modified Independent             General bed mobility comments: HOB elevated  Transfers Overall transfer level: Needs assistance Equipment used: None Transfers: Sit to/from Stand Sit to Stand: Supervision            Ambulation/Gait Ambulation/Gait assistance: Min guard;Min assist Gait Distance (Feet): 150  Feet Assistive device: None Gait Pattern/deviations: Step-through pattern;Decreased stride length Gait velocity: Decreased Gait velocity interpretation: <1.31 ft/sec, indicative of household ambulator General Gait Details: Slow, mildly unsteady gait without DME, 2x minA to correct lateral LOB; pt aware of LOB but balance reactions to slow to correct. Cues to increase gait speed, pt able to do so minimally. Reports this is not baseline speed  Stairs            Wheelchair Mobility    Modified Rankin (Stroke Patients Only)       Balance Overall balance assessment: Needs assistance   Sitting balance-Leahy Scale: Good       Standing balance-Leahy Scale: Fair                               Pertinent Vitals/Pain Pain Assessment: No/denies pain    Home Living Family/patient expects to be discharged to:: Private residence Living Arrangements: Alone Available Help at Discharge: Friend(s);Available 24 hours/day Type of Home: House Home Access: Stairs to enter Entrance Stairs-Rails: Right;None(5 steps with rail, rest are threshold-type steps without rail) Entrance Stairs-Number of Steps: 8 total Home Layout: One level Home Equipment: None Additional Comments: Friend (old coworker) present and reports he plans to move in to patient's home to provide at least initial 24/7    Prior Function Level of Independence: Independent         Comments: Retired from [employer]. Reports not much to do during retired life. Lives alone     Hand Dominance        Extremity/Trunk Assessment   Upper Extremity Assessment Upper Extremity Assessment: Generalized weakness  Lower Extremity Assessment Lower Extremity Assessment: Generalized weakness    Cervical / Trunk Assessment Cervical / Trunk Assessment: Kyphotic  Communication   Communication: No difficulties  Cognition Arousal/Alertness: Awake/alert Behavior During Therapy: Flat affect Overall Cognitive  Status: Impaired/Different from baseline Area of Impairment: Orientation;Attention;Memory;Following commands;Safety/judgement;Awareness;Problem solving                 Orientation Level: Disoriented to;Place;Time;Situation Current Attention Level: Sustained Memory: Decreased short-term memory Following Commands: Follows one step commands consistently;Follows one step commands with increased time Safety/Judgement: Decreased awareness of safety;Decreased awareness of deficits Awareness: Intellectual Problem Solving: Decreased initiation;Requires verbal cues General Comments: Friend reports pt not back to baseline - still seems groggy and confused. Pt following simple commands and answering majority of questions appropriately. States year is 20-"one hundred"-21; it's either March or April" and we are in a hospital, but not sure which one      General Comments General comments (skin integrity, edema, etc.): SpO2 down to 88% on RA, O2 [place] replaced    Exercises     Assessment/Plan    PT Assessment Patient needs continued PT services  PT Problem List Decreased strength;Decreased activity tolerance;Decreased balance;Decreased mobility;Decreased cognition;Decreased safety awareness       PT Treatment Interventions DME instruction;Gait training;Stair training;Functional mobility training;Therapeutic activities;Therapeutic exercise;Balance training;Cognitive remediation;Patient/family education    PT Goals (Current goals can be found in the Care Plan section)  Acute Rehab PT Goals Patient Stated Goal: Would like to return home PT Goal Formulation: With patient Time For Goal Achievement: 02/23/20 Potential to Achieve Goals: Good    Frequency Min 3X/week   Barriers to discharge        Co-evaluation               AM-PAC PT "6 Clicks" Mobility  Outcome Measure Help needed turning from your back to your side while in a flat bed without using bedrails?: None Help needed  moving from lying on your back to sitting on the side of a flat bed without using bedrails?: None Help needed moving to and from a bed to a chair (including a wheelchair)?: None Help needed standing up from a chair using your arms (e.g., wheelchair or bedside chair)?: None Help needed to walk in hospital room?: A Little Help needed climbing 3-5 steps with a railing? : A Little 6 Click Score: 22    End of Session Equipment Utilized During Treatment: Gait belt Activity Tolerance: Patient tolerated treatment well Patient left: in chair;with call bell/phone within reach;with chair alarm set;with nursing/sitter in room Nurse Communication: Mobility status PT Visit Diagnosis: Other abnormalities of gait and mobility (R26.89);Muscle weakness (generalized) (M62.81)    Time: [id number] PT Time Calculation (min) (ACUTE ONLY): 22 min   Charges:   PT Evaluation $PT Eval Moderate Complexity: 1 Mod     Mabeline Caras, PT, DPT Acute Rehabilitation Services  Pager [phone] Office [place] 02/09/2020, 12:38 PM

## 2020-02-09 NOTE — Progress Notes (Addendum)
[facility] for Heparin (without bolus) Indication: History of Mural Thrombus  No Known Allergies  Patient Measurements: Height: 5\' 10"  (177.8 cm) Weight: 93.6 kg (206 lb 6.4 oz) IBW/kg (Calculated) : 73 Heparin Dosing Weight:  91.9 kg  Vital Signs: Temp: 98.7 F (37.1 C) (04/01 0806) Temp Source: Oral (04/01 0806) BP: 107/70 (04/01 0806) Pulse Rate: 84 (04/01 0806)  Labs: Recent Labs    02/07/20 0445 02/07/20 0445 02/07/20 1330 02/08/20 0113 02/08/20 0505 02/09/20 0440 02/09/20 0530  HGB 10.2*   < >  --   --  10.5* 9.3*  --   HCT 32.6*  --   --   --  33.8* 29.4*  --   PLT 182  --   --   --  221 212  --   HEPARINUNFRC 0.11*  --    < > 0.49 0.66  --  1.34*  CREATININE 0.92  --   --   --  0.90 0.87  --    < > = values in this interval not displayed.    Estimated Creatinine Clearance: 102.4 mL/min (by C-G formula based on SCr of 0.87 mg/dL).   Medical History: Past Medical History:  Diagnosis Date  . Barrett's esophagus 04/21/2018  . CAD (coronary artery disease)    a. s/p STEMI in 01/2019 with DES to proximal LAD  . CHF (congestive heart failure) ([place])    a. EF 35-40% by echo in 01/2019, at 25-30% by repeat echo in 05/2019 and 08/2019  . Depression   . High cholesterol   . Hypertension   . Hypothyroid   . Sleep apnea    wears CPAP at home   Assessment: 64 yr old male with hx of ischemic cardiomyopathy S/P STEMI and PCI in 01/2019, HFrEF (last EF 25-30% in 08/2019), HTN, HLD, OSA, and depression presented with AMS and potential cardiogenic shock. Pt was previously on Xarelto for mural thrombus with unknown compliance (not on current med list, but pt told RN he was taking Xarelto; however, pt is confused). Pharmacy is consulted to dose heparin for history of mural thrombus.   Heparin level this AM above goal at 1.34 on 2050 units/hr - recollected level to confirm elevation >> still remains elevated at 1.02. Hgb 9.3, plt 212. No  bleeding noted. No infusion issues noted. Heparin is running through the PICC and she believes lab drew from peripheral site.   Goal of Therapy:  Heparin level 0.3-0.7 units/ml Monitor platelets by anticoagulation protocol: Yes   Plan:  Reduce heparin infusion to 1900 units / hr Check heparin level in 6 hours Monitor daily heparin level and CBC, s/sx bleeding F/u long-term plan for anticoagulation  Vertis Kelch, PharmD, [facility] PGY2 Cardiology Pharmacy Resident Phone [phone] 02/09/2020       9:31 AM  Please check [email or web address] for unit-specific pharmacist phone numbers

## 2020-02-09 NOTE — Progress Notes (Signed)
Subjective:  Tony Alvarez was examined and evaluated at bedside this am. He was observed resting comfortably in bed. He mentions that he is having headaches but unable to describe in detail. He is also unable to describe his hospital course. He denies any chest pain, palpitations, dyspnea.   Objective:    Vital Signs (last 24 hours): Vitals:   02/09/20 0016 02/09/20 0044 02/09/20 0100 02/09/20 0300  BP:   100/76 103/75  Pulse:      Resp: 20  19 (!) 25  Temp:    (!) 97.5 F (36.4 C)  TempSrc:      SpO2: 92%     Weight:  93.6 kg    Height:       Physical Exam: General Alert and responds to questions, no acute distress  Cardiac Regular rate and rhythm, no murmurs, rubs, or gallops  Pulmonary Clear to auscultation bilaterally without wheezes, rhonchi, or rales  Abdominal Soft, non-tender, without distention  Extremities Trace bilateral lower extremity edema    BMP Latest Ref Rng & Units 02/09/2020 02/08/2020 02/07/2020  Glucose 70 - 99 mg/dL 253(H) 100(H) 100(H)  BUN 8 - 23 mg/dL 12 11 14   Creatinine 0.61 - 1.24 mg/dL 0.87 0.90 0.92  BUN/Creat Ratio 10 - 24 - - -  Sodium 135 - 145 mmol/L 130(L) 135 136  Potassium 3.5 - 5.1 mmol/L 3.6 3.9 4.3  Chloride 98 - 111 mmol/L 97(L) 100 103  CO2 22 - 32 mmol/L 24 27 24   Calcium 8.9 - 10.3 mg/dL 8.7(L) 9.3 8.9   CBC Latest Ref Rng & Units 02/09/2020 02/08/2020 02/07/2020  WBC 4.0 - 10.5 K/uL 6.3 9.0 8.5  Hemoglobin 13.0 - 17.0 g/dL 9.3(L) 10.5(L) 10.2(L)  Hematocrit 39.0 - 52.0 % 29.4(L) 33.8(L) 32.6(L)  Platelets 150 - 400 K/uL 212 221 182   Coox O2 Saturation: 66.7  Out: 3050 ml In: 950 ml Net: 2100 ml out Body Mass Trend: 93.3 kg (3/31) -> 93.6 kg (4/1)   Assessment/Plan:   Principal Problem:   Cardiogenic shock (HCC) Active Problems:   Acute exacerbation of CHF (congestive heart failure) (HCC)   Acute encephalopathy   Transaminitis  64 year old male with past medical history significant for ischemic cardiomyopathy status  post STEMI and PCI in March 2020, HFrEF, hypertension, hyperlipidemia, OSA, and depression who presented with altered mental status, lower extremity edema, and elevated BNP on 02/02/2020.  # Cardiogenic shock # Acute on chronic HFrEF+diastolic dysfunction Patient with EF 15-20% and grade III diastolic dysfunction (restrictive), patient remains inotrope dependent.  Patient was given 40 mg p.o. Lasix yesterday, net output of 2100 and now since yesterday at 7 AM. Patient renal function stable and WNL, electrolytes with K of 3.6, Mag 1.7 - will replete *Advanced heart failure following, we appreciate their continued recommendations *Inotrope dependent *Continue Lasix 80 mg p.o. daily *Continue milrinone 0.375 mcg/kg/min - Coox of 66.7. Patient failing milrinone wean. VAD not option 2/2 patient mental status *Continue digoxin 0.125 mg daily + spironolactone 12.5 mg daily *BP too low for ARB/ARNI *Strict I/Os, daily weights *Palliative care consulted, we appreciate their recommendations  # AMS # MDD Patient with waxing waning mental status.  Psychiatry evaluated patient, they recommended to continue aripiprazole 10 mg daily + clonazepam 1 mg daily while inpatient with plans for outpatient taper and titration to an SSRI.  Unclear if etiology is secondary to low output heart failure given lack of improvement with increase milrinone dosage  # Hx of LV thrombus * Patient continues on  heparin drip in replacement of home Xarelto, pending potential need for inpatient procedure  # Normocytic anemia Hemoglobin 9.3 on CBC but 10.4 on total  Hemoglobin measurement. Will continue to monitor *B12 high at 1077, MMA pending *Ferritin 49, iron 25, TIBC 318, with low saturation ratio at 8 *IV iron given yesterday (02/08/2020)  # CAD s/p STEMI, pCI 01/2019 w LAD stent: * Plavix stopped on 3/28 for potential placement of VAC * Continue   PT/OT: Consulted Diet: Regular DVT Ppx: On therapeutic heparin for history  of LV thrombus/potential for procedure Admit Status: Inpatient Dispo: Anticipated discharge pending clinical improvement  Jeanmarie Hubert, MD 02/09/2020, 6:11 AM

## 2020-02-09 NOTE — Progress Notes (Addendum)
Advanced Heart Failure Rounding Note  PCP-Cardiologist: Shelva Majestic, MD   Subjective:    Echo EF 10% RV mild to moderately down   On milrinone 0.375 mcg. CO-OX up to 67%  Remains confused with sitter at bedside. Denies SOB. Knows his name but does not where is or the location.   Objective:   Weight Range: 93.6 kg Body mass index is 29.62 kg/m.   Vital Signs:   Temp:  [97.5 F (36.4 C)-98.7 F (37.1 C)] 97.7 F (36.5 C) (04/01 1210) Pulse Rate:  [84-106] 106 (04/01 1210) Resp:  [11-28] 15 (04/01 1210) BP: (94-108)/(54-80) 108/80 (04/01 1210) SpO2:  [92 %-98 %] 96 % (04/01 1210) Weight:  [93.6 kg] 93.6 kg (04/01 0044) Last BM Date: 02/04/20  Weight change: Filed Weights   02/07/20 0300 02/08/20 0600 02/09/20 0044  Weight: 96.8 kg 93.3 kg 93.6 kg    Intake/Output:   Intake/Output Summary (Last 24 hours) at 02/09/2020 1257 Last data filed at 02/09/2020 0854 Gross per 24 hour  Intake 1908.49 ml  Output 2350 ml  Net -441.51 ml      Physical Exam  CVP 3-4  General:   No resp difficulty HEENT: normal Neck: supple. no JVD. Carotids 2+ bilat; no bruits. No lymphadenopathy or thryomegaly appreciated. Cor: PMI nondisplaced. Regular rate & rhythm. No rubs, or murmurs. +S3  Lungs: clear Abdomen: soft, nontender, nondistended. No hepatosplenomegaly. No bruits or masses. Good bowel sounds. Extremities: no cyanosis, clubbing, rash, edema Neuro: alert & orientedx1 to self. Cranial nerves grossly intact. moves all 4 extremities w/o difficulty. Affect flat.    Telemetry   SR-ST 90-100s     Labs    CBC Recent Labs    02/08/20 0505 02/09/20 0440  WBC 9.0 6.3  HGB 10.5* 9.3*  HCT 33.8* 29.4*  MCV 89.2 87.8  PLT 221 [id number]   Basic Metabolic Panel Recent Labs    02/08/20 0505 02/09/20 0440  NA 135 130*  K 3.9 3.6  CL 100 97*  CO2 27 24  GLUCOSE 100* 253*  BUN 11 12  CREATININE 0.90 0.87  CALCIUM 9.3 8.7*  MG 2.0 1.7   Liver Function Tests Recent  Labs    02/07/20 0445 02/08/20 0505  AST 33 32  ALT 139* 111*  ALKPHOS 64 60  BILITOT 1.5* 1.2  PROT 5.4* 5.1*  ALBUMIN 2.8* 2.8*   No results for input(s): LIPASE, AMYLASE in the last 72 hours. Cardiac Enzymes No results for input(s): CKTOTAL, CKMB, CKMBINDEX, TROPONINI in the last 72 hours.  BNP: BNP (last 3 results) Recent Labs    12/02/19 1146 12/14/19 1048 02/02/20 1343  BNP 656.0* 438.6* 2,899.9*    ProBNP (last 3 results) Recent Labs    07/26/19 1102  PROBNP 992*     D-Dimer No results for input(s): DDIMER in the last 72 hours. Hemoglobin A1C No results for input(s): HGBA1C in the last 72 hours. Fasting Lipid Panel No results for input(s): CHOL, HDL, LDLCALC, TRIG, CHOLHDL, LDLDIRECT in the last 72 hours. Thyroid Function Tests No results for input(s): TSH, T4TOTAL, T3FREE, THYROIDAB in the last 72 hours.  Invalid input(s): FREET3  Other results:   Imaging    No results found.   Medications:     Scheduled Medications: . ARIPiprazole  10 mg Oral Daily  . atorvastatin  80 mg Oral q1800  . Chlorhexidine Gluconate Cloth  6 each Topical Daily  . clonazePAM  0.5 mg Oral BID  . digoxin  0.125 mg  Oral Daily  . feeding supplement (GLUCERNA SHAKE)  237 mL Oral TID BM  . folic acid  1 mg Oral Daily  . furosemide  80 mg Oral Daily  . insulin aspart  0-6 Units Subcutaneous TID WC  . levothyroxine  125 mcg Oral QAC breakfast  . multivitamin with minerals  1 tablet Oral Daily  . pantoprazole  40 mg Oral Daily  . sodium chloride flush  10-40 mL Intracatheter Q12H  . spironolactone  12.5 mg Oral Daily    Infusions: . sodium chloride 250 mL (02/09/20 0839)  . heparin 2,050 Units/hr (02/09/20 0753)  . milrinone 0.375 mcg/kg/min (02/09/20 0600)    PRN Medications: sodium chloride, albuterol, sodium chloride flush     Assessment/Plan   1. Cardiogenic Shock  - Has ICD  - ECHO this admit EF 10% with RV mild moderately reduce. In 2020 EF 25-30%    - Lactic Acid trending down 2.9>2.1>1.7  - CO-OX 67%. Continue milrinone to 0.375 mcg.  -CVp 3-4. Volume status stable. Continue lasix 80 mg po daily.   - Hold beta blocker due to low output state.  - Continue digoxin 0.125 - Increase spiro to 25 mg daily.  - BP too low for ARB/ARNI - Renal function stable.  - Shock improved improved with milrinone. He is failing milrinone wean and appears inotrope dependent. Thus I have discussed the possibility of VAD placement with him. He said he would be interested in VAD support if he needed it. How he has very limited social support and says he can only rely on his neighbors. We will continue to adjust his HF meds and may initiate the VAD process to see if he is a candidate early next week depending on further discussions with him. - VAD Team consulted   2. CAD s/p STEMI, PCI 01/2019 - No chest pain.   - Continue plavix + statin.  - no ASA given concomitant anticoagulation.  - plavix stopped 3/28. Given possibility of VAD  3. History of LV Thrombus - On Xarelto as outpatient.  - On heparin drip for possible invasive hemodynamics this admission. No bleeding  4. AKI  - In the setting of low output HF.   - Creatinine peaked at 1.5 - Resolved.   5. Hypothyroidism  - On levothyroxine.  6. AMS - CT of head was negative.  - UDS negative.  - CO-OX improved on higher dose of milrinone but remains confused?  - Has h/o major depressive disorder.   7. Hypokalemia -Stable   8. Hypomagnesemia - Mag 1.7  VAD currently not an option with AMS. Unable to complete VAD work up.   May need pysch consult.   Length of Stay: [place], NP  02/09/2020, 12:57 PM  Advanced Heart Failure Team Pager [phone] (M-F; 7a - 4p)  Please contact [facility] for night-coverage after hours (4p -7a ) and weekends on [email or web address]  Patient seen and examined with the above-signed Advanced Practice Provider and/or Housestaff. I personally reviewed laboratory  data, imaging studies and relevant notes. I independently examined the patient and formulated the important aspects of the plan. I have edited the note to reflect any of my changes or salient points. I have personally discussed the plan with the patient and/or family.  Milrinone increased to 0.375 yesterday. Co-ox improved to 67%  He is more alert but remains confused. Denies SOB, orthopnea or PND.   Family meeting with Palliative Care team today. Would want DNR/DNI and hospice enrollment. Deactivate  ICD.   General:  Fatigued appearing. Disshelved. Confused  HEENT: normal Neck: supple. JVP 5 Carotids 2+ bilat; no bruits. No lymphadenopathy or thryomegaly appreciated. Cor: PMI nondisplaced. Regular rate & rhythm. + s3 Lungs: clear Abdomen: soft, nontender, nondistended. No hepatosplenomegaly. No bruits or masses. Good bowel sounds. Extremities: no cyanosis, clubbing, rash, edema Neuro: confused non-focal  Flat affect   He is end-stage. He is not candidate for VAD. I suspect he will deteriorate quickly with removal of milrinone support. Will discuss with primary. Will likely stop milrinone soon and enroll him in residential Hospice with comfort care. Deactivate ICD.  Glori Bickers, MD  7:23 PM

## 2020-02-09 NOTE — Consult Note (Addendum)
Consultation Note Date: 02/09/2020   Patient Name: Tony Alvarez  DOB: [birth year]  MRN: [id number]  Age / Sex: 64 y.o., male  PCP: [facility] Referring Physician: Aldine Contes, MD  Reason for Consultation: Establishing goals of care and Psychosocial/spiritual support  HPI/Patient Profile: 64 y.o. male  admitted on 02/02/2020 with significant cardiac history of ischemic cardiomyopathy, artery disease status post anterior STEMI in 3 of 2020, ICD , heart failure/EF 15 to 20%, hypertension, hyperlipidemia, depression and substance abuse/EtOH..  Patient has had multiple rehospitalizations in the past 6 months.  Today is day 7 of this hospitalization.  Patient remains lethargic and intermittently confused, currently able to participate in conversation speaking directly to his brother and friend.  Had detailed onversation with brother Tony Alvarez in [place] yesterday.  On further exploration of patient's past history uncovers layers of psychosocial issues over the past many years.  His wife of 70 years died last year and he has had  depression since that time.  Tony Alvarez describes other underlying likely psychiatric issuesspecifically related to what he tells me is noncompliance with medical interventions and actually signing out [employer] on multiple occasions although I cannot actually find that in the epic notes.  He is unsure of specific diagnosis or where he has had treatment.  Chart review for multiple visits in the last 12 to 24 months with [facility].  It was also uncovered today from history from friend and family that patient's PCP is in [facility], approximately 2 hours away.  This is curious as patient has multiple psychiatric meds listed on his home profile and unsure where these have originated.  Although family live in [place], his 5 siblings are aware of  continued physical and functional decline over the past few years, and his limited social support.    A friend named Tony Alvarez has tried to help as he can, coming to visit every few weeks.  Patient and family face treatment option decisions, advanced directive decisions and anticipatory care needs     Clinical Assessment and Goals of Care:  This NP Tony Alvarez reviewed medical records, received report from team, assessed the patient and then meet at the patient's bedside along with his friend and main local support person Tony Alvarez [phone] and his brother Tony Alvarez on conference call to discuss diagnosis, prognosis, GOC, AD care planning, disposition and options.  Concept of Palliative Care and Hospice  were discussed  Created space and opportunity for patient and family to explore their thoughts and feelings regarding current medical situation.  Values and goals of care important to the patient were attempted to be elicited  Although patient is drowsy he was able to participate in today's conversation and verbalized to his friend and family, that comfort is a priority, his desire to not be resuscitated and his understanding of the seriousness of his disease.   He expresses sadness over the loss of his wife a year ago, and his pleasure and love for his  2 dogs Tony Alvarez and Tony Alvarez.   A detailed discussion was had today regarding advanced directives.  Concepts specific to code status, function of ICD and rehospitalization was had.  The difference between a aggressive medical intervention path  and a palliative comfort care path for this patient at this time was had.    We discussed trajectory of ES-HF, medical management of and the intervention of  LVAD therapy.  Currently patient is not being worked up  2/2 to ongoing AMS  MOST form introduced but not completed today.  Natural trajectory and expectations at EOL were discussed.  Discussed hospice benefit in detail and what that service  would look like in his home.  Patient and family are interested  Questions and concerns addressed.   Family encouraged to call with questions or concerns.    PMT will continue to support holistically.   No documented healthcare power of attorney or advanced directive.  Discussed with patient family and friend at bedside.  Patient was able to verbalize that he would want his siblings to be his main decision maker in the event that he cannot make decisions on his own or that they would support him and help him make decisions for himself.  Brother/Tony Alvarez is the main contact and family spokes person He does want his friend Tony Alvarez to be included in conversations.  Consider documentation of H POA and advanced directive during this hospitalization     SUMMARY OF RECOMMENDATIONS    Code Status/Advance Care Planning:  DNR-documented today.  Family and friend supports decision  Discussed ICD and deactivation, again patient and family support this decision.   Palliative Prophylaxis:   Delirium Protocol and Frequent Pain Assessment  Additional Recommendations (Limitations, Scope, Preferences):  Patient and family understand the seriousness of his end-stage heart disease.  At this time they are hopeful to medically maximize Tony Alvarez while  he is here in the hospital.  When he is ready for discharge family are hopeful to secure hospice services in the home in order to enhance patient's comfort and dignity at this time in his life.   Hope is to avoid re-hospitalizations and maintain maximal level of functioning at home  Psycho-social/Spiritual:   Desire for further Chaplaincy support: yes  Additional Recommendations: Education on Hospice  Prognosis:   < 6 months  Discharge Planning:   When he is medically maximized and stable for discharge family are hopeful to secure hospice services in the home in order to enhance patient's comfort and dignity at this time in his life.   Hope  is to avoid re-hospitalizations and maintain maximal level of functioning at home.  Previous to this admission patient lived alone however Tony Alvarez/lifelong friend plans to move in with them be his main support and caregiver.       Primary Diagnoses: Present on Admission:  Acute encephalopathy  Cardiogenic shock ([place])   I have reviewed the medical record, interviewed the patient and family, and examined the patient. The following aspects are pertinent.  Past Medical History:  Diagnosis Date   Barrett's esophagus 04/21/2018   CAD (coronary artery disease)    a. s/p STEMI in 01/2019 with DES to proximal LAD   CHF (congestive heart failure) ([place])    a. EF 35-40% by echo in 01/2019, at 25-30% by repeat echo in 05/2019 and 08/2019   Depression    High cholesterol    Hypertension    Hypothyroid    Sleep apnea    wears CPAP at  home   Social History   Socioeconomic History   Marital status: Widowed    Spouse name: Not on file   Number of children: Not on file   Years of education: Not on file   Highest education level: Not on file  Occupational History   Not on file  Tobacco Use   Smoking status: Former Smoker    Packs/day: 0.25    Years: 40.00    Pack years: 10.00    Types: Cigarettes   Smokeless tobacco: Never Used  Substance and Sexual Activity   Alcohol use: Yes    Comment: very rare   Drug use: Never   Sexual activity: Not on file  Other Topics Concern   Not on file  Social History Narrative   Not on file   Social Determinants of Health   Financial Resource Strain:    Difficulty of Paying Living Expenses:   Food Insecurity:    Worried About Charity fundraiser in the Last Year:    Arboriculturist in the Last Year:   Transportation Needs:    Film/video editor (Medical):    Lack of Transportation (Non-Medical):   Physical Activity:    Days of Exercise per Week:    Minutes of Exercise per Session:   Stress:     Feeling of Stress :   Social Connections:    Frequency of Communication with Friends and Family:    Frequency of Social Gatherings with Friends and Family:    Attends Religious Services:    Active Member of Clubs or Organizations:    Attends Music therapist:    Marital Status:    Family History  Problem Relation Age of Onset   Colon cancer Neg Hx    Scheduled Meds:  ARIPiprazole  10 mg Oral Daily   atorvastatin  80 mg Oral q1800   Chlorhexidine Gluconate Cloth  6 each Topical Daily   clonazePAM  0.5 mg Oral BID   digoxin  0.125 mg Oral Daily   feeding supplement (GLUCERNA SHAKE)  237 mL Oral TID BM   folic acid  1 mg Oral Daily   furosemide  80 mg Oral Daily   insulin aspart  0-6 Units Subcutaneous TID WC   levothyroxine  125 mcg Oral QAC breakfast   multivitamin with minerals  1 tablet Oral Daily   pantoprazole  40 mg Oral Daily   sodium chloride flush  10-40 mL Intracatheter Q12H   [START ON 02/10/2020] spironolactone  25 mg Oral Daily   Continuous Infusions:  sodium chloride 250 mL (02/09/20 0839)   heparin 2,050 Units/hr (02/09/20 0753)   milrinone 0.375 mcg/kg/min (02/09/20 0600)   PRN Meds:.sodium chloride, albuterol, sodium chloride flush Medications Prior to Admission:  Prior to Admission medications   Medication Sig Start Date End Date Taking? Authorizing Provider  acetaminophen (TYLENOL) 325 MG tablet Take 2 tablets (650 mg total) by mouth every 6 (six) hours as needed for mild pain, fever or headache (or Fever >/= 101). 09/13/19  Yes Emokpae, Courage, MD  albuterol (VENTOLIN HFA) 108 (90 Base) MCG/ACT inhaler Inhale 2 puffs into the lungs every 6 (six) hours as needed for wheezing or shortness of breath. 09/13/19  Yes Emokpae, Courage, MD  ARIPiprazole (ABILIFY) 15 MG tablet Take 15 mg by mouth at bedtime. 09/01/19  Yes [provider]  aspirin 81 MG chewable tablet Chew 1 tablet (81 mg total) by mouth daily. 10/22/19  Yes  Enzo Bi, MD  atorvastatin (LIPITOR) 80 MG tablet Take 1 tablet (80 mg total) by mouth daily at 6 PM. 01/30/19  Yes Almyra Deforest, PA  buPROPion [facility] SR) 150 MG 12 hr tablet Take 150-300 mg by mouth See admin instructions. Take two tablets (300 mg) by mouth every morning and one tablet (150 mg) with lunch 03/27/18  Yes [provider]  carvedilol (COREG) 6.25 MG tablet Take 1 tablet (6.25 mg total) by mouth 2 (two) times daily. 12/21/19  Yes Troy Sine, MD  clonazePAM (KLONOPIN) 1 MG tablet Take 1-2 mg by mouth 2 (two) times daily. 01/13/20  Yes [provider]  clopidogrel (PLAVIX) 75 MG tablet Take 75 mg by mouth daily.   Yes [provider]  digoxin (LANOXIN) 0.125 MG tablet Take a whole pill for 4 days then 1/2 pill  after. Patient taking differently: Take 0.0625 mg by mouth daily.  11/22/19  Yes Troy Sine, MD  Eszopiclone 3 MG TABS Take 3 mg by mouth at bedtime. 09/01/19  Yes [provider]  folic acid (FOLVITE) 1 MG tablet Take 1 tablet (1 mg total) by mouth daily. 09/14/19  Yes Emokpae, Courage, MD  furosemide (LASIX) 40 MG tablet Take 1 tablet (40 mg total) by mouth daily. 12/21/19 03/20/20 Yes Troy Sine, MD  levothyroxine (SYNTHROID) 125 MCG tablet Take 125 mcg by mouth daily before breakfast.    Yes [provider]  Multiple Vitamin (MULTIVITAMIN WITH MINERALS) TABS tablet Take 1 tablet by mouth every morning.   Yes [provider]  nitroGLYCERIN (NITROSTAT) 0.4 MG SL tablet Place 1 tablet (0.4 mg total) under the tongue every 5 (five) minutes x 3 doses as needed for chest pain. 01/30/19  Yes Almyra Deforest, PA  pantoprazole (PROTONIX) 40 MG tablet Take 1 tablet (40 mg total) by mouth daily. 09/13/19 09/12/20 Yes Emokpae, Courage, MD  potassium chloride SA (KLOR-CON) 20 MEQ tablet Take 2 tablets (40 mEq total) by mouth daily. 10/21/19 02/02/20 Yes Enzo Bi, MD  QUEtiapine (SEROQUEL) 100 MG tablet Take 50 mg by mouth at bedtime.   10/04/19  Yes [provider]  spironolactone (ALDACTONE) 25 MG tablet Take 0.5 tablets (12.5 mg total) by mouth 2 (two) times daily. 12/21/19  Yes Troy Sine, MD   No Known Allergies Review of Systems  Neurological: Positive for weakness.    Physical Exam Constitutional:      General: He is awake.     Appearance: He is ill-appearing.  Cardiovascular:     Rate and Rhythm: Tachycardia present.  Skin:    General: Skin is warm and dry.  Neurological:     Mental Status: He is lethargic.     Vital Signs: BP 108/80 (BP Location: Left Arm)    Pulse (!) 106    Temp 97.7 F (36.5 C) (Oral)    Resp 15    Ht 5\' 10"  (1.778 m)    Wt 93.6 kg    SpO2 96%    BMI 29.62 kg/m  Pain Scale: 0-10   Pain Score: 0-No pain   SpO2: SpO2: 96 % O2 Device:SpO2: 96 % O2 Flow Rate: .O2 Flow Rate (L/min): 4 L/min  IO: Intake/output summary:   Intake/Output Summary (Last 24 hours) at 02/09/2020 1327 Last data filed at 02/09/2020 1259 Gross per 24 hour  Intake 1908.49 ml  Output 2500 ml  Net -591.51 ml    LBM: Last BM Date: 02/04/20 Baseline Weight: Weight: 93.4 kg Most recent weight: Weight: 93.6 kg  Palliative Assessment/Data: 40 % at best    Discussed with Darrick Grinder NP and Dr Dareen Piano.  Family is requesting daily updates from providers  Time In: 1000 Time Out: 1115 Time Total: 75 minutes Greater than 50%  of this time was spent counseling and coordinating care related to the above assessment and plan.  Signed by: Tony Lessen, NP   Please contact Palliative Medicine Team phone at [phone] for questions and concerns.  For individual provider: See Shea Evans

## 2020-02-09 NOTE — Progress Notes (Signed)
CARDIAC REHAB PHASE I   PRE:  Rate/Rhythm: 103 ST    BP: sitting 101/70    SaO2: 91 4L in bed, 97 4L standing   MODE:  Ambulation: 100 ft   POST:  Rate/Rhythm: 108 ST    BP: sitting 113/76     SaO2: 92 2L  Pt in bed, able to respond somewhat appropriately. Able to get up and walk with standby assist. Slow pace, wanted to hold to bedrail. C/o some dizziness. Seemed fatigued with distance. Used 2L in hall. Return to bed on bed alarm in more upright position.  [place], ACSM 02/09/2020 2:33 PM

## 2020-02-09 NOTE — Progress Notes (Signed)
RN received a call from Legrand Como with [facility] regarding deactivation of ICD. Legrand Como states he will put him on the schedule for ICD deactivation at 0730 on 02/10/2020.

## 2020-02-10 DIAGNOSIS — Z955 Presence of coronary angioplasty implant and graft: Secondary | ICD-10-CM

## 2020-02-10 LAB — BASIC METABOLIC PANEL
Anion gap: 11 (ref 5–15)
BUN: 11 mg/dL (ref 8–23)
CO2: 26 mmol/L (ref 22–32)
Calcium: 9.8 mg/dL (ref 8.9–10.3)
Chloride: 99 mmol/L (ref 98–111)
Creatinine, Ser: 1.08 mg/dL (ref 0.61–1.24)
GFR calc Af Amer: >60 mL/min (ref >60)
GFR calc non Af Amer: >60 mL/min (ref >60)
Glucose, Bld: 99 mg/dL (ref 70–99)
Potassium: 3.9 mmol/L (ref 3.5–5.1)
Sodium: 136 mmol/L (ref 135–145)

## 2020-02-10 LAB — HEPARIN LEVEL (UNFRACTIONATED): Heparin Unfractionated: 0.83 IU/mL — ABNORMAL HIGH (ref 0.30–0.70)

## 2020-02-10 LAB — MAGNESIUM: Magnesium: 2.4 mg/dL (ref 1.7–2.4)

## 2020-02-10 LAB — CBC
HCT: 34.6 % — ABNORMAL LOW (ref 39.0–52.0)
Hemoglobin: 10.9 g/dL — ABNORMAL LOW (ref 13.0–17.0)
MCH: 27.7 pg (ref 26.0–34.0)
MCHC: 31.5 g/dL (ref 30.0–36.0)
MCV: 87.8 fL (ref 80.0–100.0)
Platelets: 275 10*3/uL (ref 150–400)
RBC: 3.94 MIL/uL — ABNORMAL LOW (ref 4.22–5.81)
RDW: 17.4 % — ABNORMAL HIGH (ref 11.5–15.5)
WBC: 8.1 10*3/uL (ref 4.0–10.5)
nRBC: 0.4 % — ABNORMAL HIGH (ref 0.0–0.2)

## 2020-02-10 LAB — COOXEMETRY PANEL
Carboxyhemoglobin: 0.9 % (ref 0.5–1.5)
Methemoglobin: 0.8 % (ref 0.0–1.5)
O2 Saturation: 59.3 %
Total hemoglobin: 10.9 g/dL — ABNORMAL LOW (ref 12.0–16.0)

## 2020-02-10 LAB — GLUCOSE, CAPILLARY
Glucose-Capillary: 90 mg/dL (ref 70–99)
Glucose-Capillary: 152 mg/dL — ABNORMAL HIGH (ref 70–99)
Glucose-Capillary: 142 mg/dL — ABNORMAL HIGH (ref 70–99)
Glucose-Capillary: 112 mg/dL — ABNORMAL HIGH (ref 70–99)

## 2020-02-10 MED ORDER — RIVAROXABAN 20 MG PO TABS
20.0000 mg | ORAL_TABLET | Freq: Every day | ORAL | Status: DC
  Administered 2020-02-10 – 2020-02-12 (×3): 20 mg via ORAL
  Filled 2020-02-10 (×3): qty 1

## 2020-02-10 MED ORDER — ASPIRIN EC 81 MG PO TBEC
81.0000 mg | DELAYED_RELEASE_TABLET | Freq: Every day | ORAL | Status: DC
  Administered 2020-02-10 – 2020-02-14 (×5): 81 mg via ORAL
  Filled 2020-02-10 (×4): qty 1

## 2020-02-10 MED ORDER — POTASSIUM CHLORIDE CRYS ER 20 MEQ PO TBCR
20.0000 meq | EXTENDED_RELEASE_TABLET | Freq: Once | ORAL | Status: AC
  Administered 2020-02-10: 20 meq via ORAL
  Filled 2020-02-10: qty 1

## 2020-02-10 NOTE — Progress Notes (Addendum)
Advanced Heart Failure Rounding Note  PCP-Cardiologist: Shelva Majestic, MD   Subjective:    Echo EF 10% RV mild to moderately down   On milrinone 0.375 mcg. CO-OX 59%.   Remains confused.   Family wishes to proceed w/ home hospice. ICD deactivated.    Objective:   Weight Range: 91.2 kg Body mass index is 28.85 kg/m.   Vital Signs:   Temp:  [97.8 F (36.6 C)-98.6 F (37 C)] 98.2 F (36.8 C) (04/02 1109) Pulse Rate:  [82-106] 99 (04/02 1109) Resp:  [16-20] 20 (04/02 1109) BP: (91-103)/(58-73) 91/60 (04/02 1109) SpO2:  [94 %-99 %] 96 % (04/02 1109) Weight:  [91.2 kg] 91.2 kg (04/02 0022) Last BM Date: 01/09/20  Weight change: Filed Weights   02/08/20 0600 02/09/20 0044 02/10/20 0022  Weight: 93.3 kg 93.6 kg 91.2 kg    Intake/Output:   Intake/Output Summary (Last 24 hours) at 02/10/2020 1425 Last data filed at 02/10/2020 1319 Gross per 24 hour  Intake 1615.74 ml  Output 2900 ml  Net -1284.26 ml      Physical Exam   CVP 3-4 General:  Weak appearing WM, No resp difficulty HEENT: normal anicteric Neck: supple. no JVD. Carotids 2+ bilat; no bruits. No lymphadenopathy or thryomegaly appreciated. Cor: PMI nondisplaced. Regular rhythm, mildly tachy rate. No rubs, or murmurs. +S3  Lungs: clear no wheeze Abdomen: soft, nontender, nondistended. No hepatosplenomegaly. No bruits or masses. Good bowel sounds. Extremities: no cyanosis, clubbing, rash, edema + RUE PICC Neuro: alert & oriented x 3, cranial nerves grossly intact. moves all 4 extremities w/o difficulty. Affect pleasant    Telemetry   Sinus tach low 100s   Labs    CBC Recent Labs    02/09/20 0440 02/10/20 0500  WBC 6.3 8.1  HGB 9.3* 10.9*  HCT 29.4* 34.6*  MCV 87.8 87.8  PLT 212 [id number]   Basic Metabolic Panel Recent Labs    02/09/20 0440 02/10/20 0500  NA 130* 136  K 3.6 3.9  CL 97* 99  CO2 24 26  GLUCOSE 253* 99  BUN 12 11  CREATININE 0.87 1.08  CALCIUM 8.7* 9.8  MG 1.7 2.4    Liver Function Tests Recent Labs    02/08/20 0505  AST 32  ALT 111*  ALKPHOS 60  BILITOT 1.2  PROT 5.1*  ALBUMIN 2.8*   No results for input(s): LIPASE, AMYLASE in the last 72 hours. Cardiac Enzymes No results for input(s): CKTOTAL, CKMB, CKMBINDEX, TROPONINI in the last 72 hours.  BNP: BNP (last 3 results) Recent Labs    12/02/19 1146 12/14/19 1048 02/02/20 1343  BNP 656.0* 438.6* 2,899.9*    ProBNP (last 3 results) Recent Labs    07/26/19 1102  PROBNP 992*     D-Dimer No results for input(s): DDIMER in the last 72 hours. Hemoglobin A1C No results for input(s): HGBA1C in the last 72 hours. Fasting Lipid Panel No results for input(s): CHOL, HDL, LDLCALC, TRIG, CHOLHDL, LDLDIRECT in the last 72 hours. Thyroid Function Tests No results for input(s): TSH, T4TOTAL, T3FREE, THYROIDAB in the last 72 hours.  Invalid input(s): FREET3  Other results:   Imaging    No results found.   Medications:     Scheduled Medications: . ARIPiprazole  10 mg Oral Daily  . atorvastatin  80 mg Oral q1800  . Chlorhexidine Gluconate Cloth  6 each Topical Daily  . clonazePAM  0.5 mg Oral BID  . digoxin  0.125 mg Oral Daily  . feeding  supplement (GLUCERNA SHAKE)  237 mL Oral TID BM  . folic acid  1 mg Oral Daily  . furosemide  80 mg Oral Daily  . insulin aspart  0-6 Units Subcutaneous TID WC  . levothyroxine  125 mcg Oral QAC breakfast  . multivitamin with minerals  1 tablet Oral Daily  . pantoprazole  40 mg Oral Daily  . rivaroxaban  20 mg Oral Daily  . sodium chloride flush  10-40 mL Intracatheter Q12H  . spironolactone  25 mg Oral Daily    Infusions: . sodium chloride 250 mL (02/09/20 0839)  . milrinone 0.375 mcg/kg/min (02/10/20 0602)    PRN Medications: sodium chloride, albuterol, sodium chloride flush     Assessment/Plan   1. Cardiogenic Shock  -Has ICD - now deactivated. Progressing to comfort care.  - ECHO this admit EF 10% with RV mild  moderately reduce. In 2020 EF 25-30%  - Lactic Acid trending down 2.9>2.1>1.7  -CO-OX 59% on milrinone  0.375 mcg.  -CVP 3-4. Volume status stable. Continue lasix 80 mg po daily.   - Hold beta blocker due to low output state.  - Continue digoxin 0.125 - Continue spiro 25 mg daily.  - BP too low for ARB/ARNI - Renal function stable.  - Shock improved with milrinone. He is failing milrinone wean and appears inotrope dependent. Thus I have discussed the possibility of VAD placement with him. He said he would be interested in VAD support if he needed it. How he has very limited social support and says he can only rely on his neighbors.  - Pt and family have decided to move to comfort care/ home hospice. He wants no further life sustaining measures.  - Will wean off milrinone and plan transition to hospice   2. CAD s/p STEMI, PCI 01/2019 - No chest pain.   - Continue ASA + statin.    3. History of LV Thrombus - On Xarelto as outpatient.   4. AKI  - In the setting of low output HF.   - Creatinine peaked at 1.5 - Resolved.   5. Hypothyroidism  - On levothyroxine.  6. AMS - CT of head was negative.  - UDS negative.  - CO-OX improved on higher dose of milrinone but remains confused?  - Has h/o major depressive disorder.   7. Hypokalemia -resolved. K 3.9   8. Hypomagnesemia - Resolved. Mag 2.4 today    Length of Stay: [address], [place]  02/10/2020, 2:25 PM  Advanced Heart Failure Team Pager [phone] (M-F; 7a - 4p)  Please contact [facility] for night-coverage after hours (4p -7a ) and weekends on [email or web address]  Patient seen and examined with the above-signed Advanced Practice Provider and/or Housestaff. I personally reviewed laboratory data, imaging studies and relevant notes. I independently examined the patient and formulated the important aspects of the plan. I have edited the note to reflect any of my changes or salient points. I have personally discussed the plan  with the patient and/or family.  No change overnight. Remains on milrinone. Co-ox improved but still confused. Family wants to proceed with hospice placement. ICD is deactivated.   Once hospice placement arranged will turn off milrinone. Suspect he will likely decompensate within days to weeks without inotrope support.   Exam as above (edited)   HF team will see as needed over the weekend. Please call me with questions.   Glori Bickers, MD  4:45 PM

## 2020-02-10 NOTE — Progress Notes (Signed)
[place] for Heparin (without bolus) >> Xarelto Indication: History of Mural Thrombus  No Known Allergies  Patient Measurements: Height: 5\' 10"  (177.8 cm) Weight: 91.2 kg (201 lb 1.6 oz) IBW/kg (Calculated) : 73 Heparin Dosing Weight:  91.9 kg  Vital Signs: Temp: 97.8 F (36.6 C) (04/02 0709) Temp Source: Oral (04/02 0709) BP: 100/65 (04/02 0709) Pulse Rate: 93 (04/02 0709)  Labs: Recent Labs    02/08/20 0505 02/08/20 0505 02/09/20 0440 02/09/20 0530 02/09/20 1136 02/09/20 2140 02/10/20 0500  HGB 10.5*   < > 9.3*  --   --   --  10.9*  HCT 33.8*  --  29.4*  --   --   --  34.6*  PLT 221  --  212  --   --   --  275  HEPARINUNFRC 0.66  --   --    < > 1.02* 0.94* 0.83*  CREATININE 0.90  --  0.87  --   --   --  1.08   < > = values in this interval not displayed.    Estimated Creatinine Clearance: 81.6 mL/min (by C-G formula based on SCr of 1.08 mg/dL).   Medical History: Past Medical History:  Diagnosis Date  . Barrett's esophagus 04/21/2018  . CAD (coronary artery disease)    a. s/p STEMI in 01/2019 with DES to proximal LAD  . CHF (congestive heart failure) ([place])    a. EF 35-40% by echo in 01/2019, at 25-30% by repeat echo in 05/2019 and 08/2019  . Depression   . High cholesterol   . Hypertension   . Hypothyroid   . Sleep apnea    wears CPAP at home   Assessment: 63 yr old male with hx of ischemic cardiomyopathy S/P STEMI and PCI in 01/2019, HFrEF (last EF 25-30% in 08/2019), HTN, HLD, OSA, and depression presented with AMS and potential cardiogenic shock. Pt was previously on Xarelto for mural thrombus with unknown compliance (not on current med list, but pt told RN he was taking Xarelto; however, pt is confused). Pharmacy was consulted to dose heparin for history of mural thrombus and now transitioning back to Xarelto with no planned procedures.  CBC stable  Goal of Therapy:  Monitor platelets by anticoagulation protocol:  Yes   Plan:  Stop heparin infusion Restart Xarelto 20 mg once daily Patient/family to be educated prior to discharge  Vertis Kelch, PharmD, [facility] PGY2 Cardiology Pharmacy Resident Phone [phone] 02/10/2020       8:40 AM  Please check [email or web address] for unit-specific pharmacist phone numbers

## 2020-02-10 NOTE — Progress Notes (Signed)
Manufacturing engineer Documentation  Liaison received referral for pt to dc home with hospice services.   Liaison called pt who was unable to communicate at the time. Liaison then called pt's brother who stated that pt does want hospice services and wants no further life sustaining measures.   Pt's chart reviewed and pt is eligible for hospice services upon discharge. Liaison will continue to follow in order to set up admission visit upon discharge.   Please outreach with any questions and thank you for the referral.   Freddie Breech, RN [facility] Liaison [phone]

## 2020-02-10 NOTE — Progress Notes (Signed)
Physical Therapy Treatment Patient Details Name: Tony Alvarez MRN: [id number] DOB: 1956-08-30 Today's Date: 02/10/2020    History of Present Illness Pt is a 64 y.o. male admitted 02/02/20 with AMS after being found by neighbor, apparently hyperventilating and confused. CT head and c-spine negative for acute abnormality. CXR with stable cardiomegaly, central pulmonary congestion, and mild bibasilar atelectasis vs edema. Workup for cardiogenic shock with acute on chronic HFrEF with diastolic dysfunction. Unclear etiology for AMS. PMH includes CAD, CHF, HTN, OSA, depression.    PT Comments    Pt admitted with above diagnosis. Pt was able to ambulate with RW with min guard assist with incr distance.  Pt needed no steadying assist with RW.  Continue to progress pt.  Pt currently with functional limitations due to balance and endurance deficits. Pt will benefit from skilled PT to increase their independence and safety with mobility to allow discharge to the venue listed below.     Follow Up Recommendations  Home health PT;Supervision/Assistance - 24 hour     Equipment Recommendations  None recommended by PT    Recommendations for Other Services       Precautions / Restrictions Precautions Precautions: Fall Precaution Comments: watch SpO2 Restrictions Weight Bearing Restrictions: No    Mobility  Bed Mobility Overal bed mobility: Modified Independent             General bed mobility comments: HOB elevated  Transfers Overall transfer level: Needs assistance Equipment used: None Transfers: Sit to/from Stand Sit to Stand: Supervision            Ambulation/Gait Ambulation/Gait assistance: Min guard;Min assist Gait Distance (Feet): 200 Feet Assistive device: Rolling walker (2 wheeled) Gait Pattern/deviations: Step-through pattern;Decreased stride length Gait velocity: Decreased Gait velocity interpretation: <1.31 ft/sec, indicative of household ambulator General Gait  Details: Slow, steady gait with RW, Cues to increase gait speed, pt able to do so. Overall steady with RW.    Stairs             Wheelchair Mobility    Modified Rankin (Stroke Patients Only)       Balance Overall balance assessment: Needs assistance Sitting-balance support: No upper extremity supported;Feet supported Sitting balance-Leahy Scale: Good     Standing balance support: Bilateral upper extremity supported;During functional activity Standing balance-Leahy Scale: Poor Standing balance comment: relies on UE support                            Cognition Arousal/Alertness: Awake/alert Behavior During Therapy: Flat affect Overall Cognitive Status: Impaired/Different from baseline Area of Impairment: Orientation;Attention;Memory;Following commands;Safety/judgement;Awareness;Problem solving                 Orientation Level: Disoriented to;Place;Time;Situation Current Attention Level: Sustained Memory: Decreased short-term memory Following Commands: Follows one step commands consistently;Follows one step commands with increased time Safety/Judgement: Decreased awareness of safety;Decreased awareness of deficits Awareness: Intellectual Problem Solving: Decreased initiation;Requires verbal cues        Exercises      General Comments General comments (skin integrity, edema, etc.): >90% on RA      Pertinent Vitals/Pain Pain Assessment: No/denies pain    Home Living Family/patient expects to be discharged to:: Private residence Living Arrangements: Alone Available Help at Discharge: Friend(s);Available 24 hours/day Type of Home: House Home Access: Stairs to enter Entrance Stairs-Rails: Right Home Layout: One level Home Equipment: None Additional Comments: pts friend plans to move in and help provide care    Prior Function  Level of Independence: Independent      Comments: Retired from [employer]. Reports not much to do during retired  life, wife has passed   PT Goals (current goals can now be found in the care plan section) Acute Rehab PT Goals Patient Stated Goal: Would like to return home Progress towards PT goals: Progressing toward goals    Frequency    Min 3X/week      PT Plan Current plan remains appropriate    Co-evaluation              AM-PAC PT "6 Clicks" Mobility   Outcome Measure  Help needed turning from your back to your side while in a flat bed without using bedrails?: None Help needed moving from lying on your back to sitting on the side of a flat bed without using bedrails?: None Help needed moving to and from a bed to a chair (including a wheelchair)?: None Help needed standing up from a chair using your arms (e.g., wheelchair or bedside chair)?: None Help needed to walk in hospital room?: A Little Help needed climbing 3-5 steps with a railing? : A Little 6 Click Score: 22    End of Session Equipment Utilized During Treatment: Gait belt Activity Tolerance: Patient tolerated treatment well Patient left: with call bell/phone within reach;in bed;with bed alarm set Nurse Communication: Mobility status PT Visit Diagnosis: Other abnormalities of gait and mobility (R26.89);Muscle weakness (generalized) (M62.81)     Time: [id number] PT Time Calculation (min) (ACUTE ONLY): 11 min  Charges:  $Gait Training: 8-22 mins                     Tony Alvarez W,PT [place] Pager:  [phone]  Office:  [place] 02/10/2020, 1:26 PM

## 2020-02-10 NOTE — TOC Initial Note (Signed)
Transition of Care [facility]) - Initial/Assessment Note    Patient Details  Name: Tony Alvarez MRN: [id number] Date of Birth: 06-Jun-1956  Transition of Care [facility]) CM/SW Contact:    Trula Ore, [place] Phone Number: 02/10/2020, 10:15 AM  Clinical Narrative:                  CSW notes palliative involvement and family wishes for home hospice. CSW made referral to [facility]. CSW spoke with Freddie Breech. She will follow up with Home Hospice Referral.    Expected Discharge Plan: Home w Hospice Care Barriers to Discharge: Continued Medical Work up   Patient Goals and CMS Choice Patient states their goals for this hospitalization and ongoing recovery are:: to go home with home hospice      Expected Discharge Plan and Services Expected Discharge Plan: [place]   Discharge Planning Services: CM Consult   Living arrangements for the past 2 months: Single Family Home                                      Prior Living Arrangements/Services Living arrangements for the past 2 months: Single Family Home Lives with:: Self Patient language and need for interpreter reviewed:: Yes Do you feel safe going back to the place where you live?: Yes      Need for Family Participation in Patient Care: Yes (Comment) Care giver support system in place?: Yes (comment)   Criminal Activity/Legal Involvement Pertinent to Current Situation/Hospitalization: No - Comment as needed  Activities of Daily Living      Permission Sought/Granted Permission sought to share information with : Case Manager, Family Supports, Investment banker, corporate granted to share info w AGENCY: Hospice Agency        Emotional Assessment       Orientation: : Oriented to Place, Oriented to Self Alcohol / Substance Use: Not Applicable Psych Involvement: No (comment)  Admission diagnosis:  Acute exacerbation of CHF (congestive heart failure) ([place]) [I50.9] Acute on  chronic systolic congestive heart failure ([place]) [I50.23] AKI (acute kidney injury) ([place]) [N17.9] Patient Active Problem List   Diagnosis Date Noted  . Palliative care by specialist   . DNR (do not resuscitate)   . Lethargy   . Transaminitis   . Cardiogenic shock ([place])   . Acute encephalopathy   . CHF (congestive heart failure) ([place]) 11/13/2019  . Acute respiratory failure with hypoxia ([place]) 11/12/2019  . CKD (chronic kidney disease) stage 3, GFR 30-59 ml/min 11/12/2019  . Acute exacerbation of CHF (congestive heart failure) ([place]) 10/22/2019  . CHF exacerbation ([place]) 10/17/2019  . Acute and chronic respiratory failure (acute-on-chronic) ([place]) 09/11/2019  . Sleep apnea   . Hypertension   . AKI (acute kidney injury) ([place])   . CAD (coronary artery disease) 06/24/2019  . Hypothyroid   . COPD (chronic obstructive pulmonary disease) ([place])   . Depression   . Hyperlipidemia 01/30/2019  . LV (left ventricular) mural thrombus with acute MI ([place]) 01/30/2019  . Alcohol abuse 01/30/2019  . Alcohol withdrawal delirium ([place]) 01/30/2019  . Elevated liver enzymes 01/30/2019  . Ischemic cardiomyopathy 01/30/2019  . Acute on chronic systolic CHF (congestive heart failure) ([place]) 01/30/2019  . History of colonic polyps 04/21/2018  . Barrett's esophagus 04/21/2018  . Barrett's esophagus without dysplasia 04/21/2018  . Rectal bleeding 04/21/2018   PCP:  [facility]:   [facility] [address], [place] [id number] [place] [address] Phone: [phone] Fax: [phone]     Social Determinants of Health (SDOH) Interventions    Readmission Risk Interventions Readmission Risk Prevention Plan 10/18/2019  Transportation Screening Complete  Social Work Consult for [place] Planning/Counseling [place] Not Applicable  Medication Review Press photographer) Complete  Some recent data might be hidden

## 2020-02-10 NOTE — Progress Notes (Signed)
[id number] Offered to walk with pt. He has worked with PT and OT today and he is tired now. Gave pt ice cream and provided emotional support. Graylon Good RN BSN 02/10/2020 2:07 PM

## 2020-02-10 NOTE — Evaluation (Signed)
Occupational Therapy Evaluation Patient Details Name: Tony Alvarez MRN: [id number] DOB: 1956/02/05 Today's Date: 02/10/2020    History of Present Illness Pt is a 64 y.o. male admitted 02/02/20 with AMS after being found by neighbor, apparently hyperventilating and confused. CT head and c-spine negative for acute abnormality. CXR with stable cardiomegaly, central pulmonary congestion, and mild bibasilar atelectasis vs edema. Workup for cardiogenic shock with acute on chronic HFrEF with diastolic dysfunction. Unclear etiology for AMS. PMH includes CAD, CHF, HTN, OSA, depression.   Clinical Impression   PTA pt living alone, taking care of himself and two dogs. At time of eval, pt presents with cognitive deficits impacting safety to engage in BADLs without supervision. Pt able to complete bed mobility at mod I level and sit <> stands at min guard for safety. Pt completed functional mobility to sink to complete x2 grooming tasks. Increased processing time needed to sequence brushing teeth and washing face. Pt took x2 seated rest breaks. He attempted to remove his O2 stating "I am fine until I start staggering". Educated pt on importance of wearing O2 as he desats with activity. Noted pt to go home with hospice. HHOT is recommended as it fits with palliative plan, to progress BADL independence in the home environment. Will continue to follow per POC listed below.     Follow Up Recommendations  Home health OT;Supervision/Assistance - 24 hour    Equipment Recommendations  3 in 1 bedside commode    Recommendations for Other Services       Precautions / Restrictions Precautions Precautions: Fall Precaution Comments: watch SpO2 Restrictions Weight Bearing Restrictions: No      Mobility Bed Mobility Overal bed mobility: Modified Independent             General bed mobility comments: HOB elevated  Transfers Overall transfer level: Needs assistance Equipment used: None Transfers: Sit  to/from Stand Sit to Stand: Min guard         General transfer comment: min guard for safety    Balance Overall balance assessment: Needs assistance Sitting-balance support: No upper extremity supported;Feet supported Sitting balance-Leahy Scale: Good     Standing balance support: Bilateral upper extremity supported;During functional activity Standing balance-Leahy Scale: Fair                            ADL either performed or assessed with clinical judgement   ADL Overall ADL's : Needs assistance/impaired Eating/Feeding: Set up;Sitting   Grooming: Min guard;Standing   Upper Body Bathing: Minimal assistance;Sitting   Lower Body Bathing: Minimal assistance;Sit to/from stand;Sitting/lateral leans;Cueing for safety   Upper Body Dressing : Minimal assistance;Sitting;Cueing for safety   Lower Body Dressing: Minimal assistance;Sit to/from stand;Sitting/lateral leans;Cueing for safety   Toilet Transfer: Min guard;Ambulation;Regular Toilet;Grab bars;Cueing for safety Toilet Transfer Details (indicate cue type and reason): able to complete functional mobility with min guard for safety to toilet and rise and steady with min guard support. Needs safety cues Toileting- Clothing Manipulation and Hygiene: Set up;Sit to/from stand   Tub/ Shower Transfer: Minimal assistance;Ambulation;Shower seat   Functional mobility during ADLs: Min guard;Cueing for safety General ADL Comments: increased safety cues due to cognitive deficits     Vision Baseline Vision/History: Wears glasses Wears Glasses: At all times Patient Visual Report: No change from baseline       Perception     Praxis      Pertinent Vitals/Pain Pain Assessment: No/denies pain     Hand Dominance  Extremity/Trunk Assessment Upper Extremity Assessment Upper Extremity Assessment: Generalized weakness   Lower Extremity Assessment Lower Extremity Assessment: Generalized weakness        Communication Communication Communication: No difficulties   Cognition Arousal/Alertness: Awake/alert Behavior During Therapy: Flat affect Overall Cognitive Status: Impaired/Different from baseline Area of Impairment: Orientation;Attention;Memory;Following commands;Safety/judgement;Awareness;Problem solving                 Orientation Level: Time;Situation Current Attention Level: Sustained Memory: Decreased short-term memory Following Commands: Follows one step commands consistently;Follows one step commands with increased time Safety/Judgement: Decreased awareness of safety;Decreased awareness of deficits Awareness: Intellectual Problem Solving: Slow processing;Decreased initiation;Requires verbal cues;Requires tactile cues General Comments: pt requiring increased processing time for simple BADL tasks and responses in conversation. Overall decreased awareness of safety and deficits. Attempted to remove O2 stating he is "fine until he starts to stagger around"   General Comments  >90% on RA    Exercises     Shoulder Instructions      Home Living Family/patient expects to be discharged to:: Private residence Living Arrangements: Alone Available Help at Discharge: Friend(s);Available 24 hours/day Type of Home: House Home Access: Stairs to enter [employer] of Steps: 8 total Entrance Stairs-Rails: Right Home Layout: One level     Bathroom Shower/Tub: Tub/shower unit;Walk-in shower         Home Equipment: None   Additional Comments: pts friend plans to move in and help provide care      Prior Functioning/Environment Level of Independence: Independent        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, wife has passed        OT Problem List: Decreased strength;Decreased knowledge of precautions;Decreased activity tolerance;Decreased cognition;Cardiopulmonary status limiting activity;Impaired balance (sitting  and/or standing);Decreased safety awareness      OT Treatment/Interventions: Self-care/ADL training;Therapeutic exercise;Patient/family education;Balance training;Energy conservation;Therapeutic activities;Cognitive remediation/compensation;DME and/or AE instruction    OT Goals(Current goals can be found in the care plan section) Acute Rehab OT Goals Patient Stated Goal: Would like to return home OT Goal Formulation: With patient Time For Goal Achievement: 02/24/20 Potential to Achieve Goals: Good  OT Frequency: Min 2X/week   Barriers to D/C:            Co-evaluation              AM-PAC OT "6 Clicks" Daily Activity     Outcome Measure Help from another person eating meals?: None Help from another person taking care of personal grooming?: None Help from another person toileting, which includes using toliet, bedpan, or urinal?: A Little Help from another person bathing (including washing, rinsing, drying)?: A Little Help from another person to put on and taking off regular upper body clothing?: A Little Help from another person to put on and taking off regular lower body clothing?: A Little 6 Click Score: 20   End of Session Equipment Utilized During Treatment: Gait belt Nurse Communication: Mobility status  Activity Tolerance: Patient tolerated treatment well Patient left: in bed;with call bell/phone within reach;with bed alarm set  OT Visit Diagnosis: Other abnormalities of gait and mobility (R26.89);Unsteadiness on feet (R26.81);Muscle weakness (generalized) (M62.81);Other symptoms and signs involving cognitive function                Time: [id number] OT Time Calculation (min): 16 min Charges:  OT General Charges $OT Visit: 1 Visit OT Evaluation $OT Eval Moderate Complexity: 1 Mod  Zenovia Jarred, MSOT, OTR/L Acute Rehabilitation Services [facility] Office Number:  [phone] Pager: [phone]  Zenovia Jarred 02/10/2020, 1:31 PM

## 2020-02-10 NOTE — Progress Notes (Signed)
I responded to consult for pastoral support. Tony Alvarez was sitting up in his bed. He was slow to respond to questions, but understandable. I offered Tony Alvarez space to voice concerns. He said he was scared. He felt isolated and not sure what he plans on doing concerning his care. He said he has two brothers and two sisters that he has not shared much of his health with, but says he will let them know what his goals of care are when the time comes.  Tony Alvarez says he lives alone with his two dogs. After he shared about his dogs, he began tearing up. He said his neighbor is caring for them at the moment. He said that he has been giving the staff grief because he tries to get out of bed. He says he knows that it is important to stay in bed, but becomes restless. I offered Tony Alvarez spiritual care with ministry of presence, a listening ear, words of encouragement and will follow up at a later time. Chaplain services available as needed.   Palliative care Chaplain Resident Fidel Levy [phone]

## 2020-02-10 NOTE — Progress Notes (Signed)
Daily Progress Note   Patient Name: Tony Alvarez       Date: 02/10/2020 DOB: December 05, 1955  Age: 64 y.o. MRN#: [id number] Attending Physician: Aldine Contes, MD Primary Care Physician: [facility] Admit Date: 02/02/2020  Reason for Consultation/Follow-up: Establishing goals of care  Subjective/GOC: Patient awake and alert. Delayed responses with some confusion. No family at bedside. Denies current needs or discomfort.   Attempted to further discuss hospice options and concern with quick decline once milrinone infusion is discontinued. Discussed home hospice versus residential hospice as an option. Preferably, patient would like to go home. He shares that friend, Tony Alvarez plans to move in with him but he will find out once he gets back home.  Patient gives permission to add friend, Tony Alvarez to emergency contact list. Also to call and arrange f/u meeting tomorrow afternoon. Spoke with Tony Alvarez via telephone. Tony Alvarez will try to be at the hospital 4/3 afternoon. Tony Alvarez asks again about LVAD as an option. We plan to further discuss tomorrow and also with brother, Tony Alvarez on the phone.   Length of Stay: 8  Current Medications: Scheduled Meds:  . ARIPiprazole  10 mg Oral Daily  . aspirin EC  81 mg Oral Daily  . atorvastatin  80 mg Oral q1800  . Chlorhexidine Gluconate Cloth  6 each Topical Daily  . clonazePAM  0.5 mg Oral BID  . digoxin  0.125 mg Oral Daily  . feeding supplement (GLUCERNA SHAKE)  237 mL Oral TID BM  . folic acid  1 mg Oral Daily  . furosemide  80 mg Oral Daily  . insulin aspart  0-6 Units Subcutaneous TID WC  . levothyroxine  125 mcg Oral QAC breakfast  . multivitamin with minerals  1 tablet Oral Daily  . pantoprazole  40 mg Oral Daily  . rivaroxaban  20 mg Oral Daily  .  sodium chloride flush  10-40 mL Intracatheter Q12H  . spironolactone  25 mg Oral Daily    Continuous Infusions: . sodium chloride 250 mL (02/09/20 0839)  . milrinone 0.375 mcg/kg/min (02/10/20 0602)    PRN Meds: sodium chloride, albuterol, sodium chloride flush  Physical Exam Vitals and nursing note reviewed.  Constitutional:      General: He is awake.     Appearance: He is ill-appearing.  Cardiovascular:  Rate and Rhythm: Regular rhythm.  Pulmonary:     Comments: Dyspnea at rest Skin:    General: Skin is warm and dry.  Neurological:     Mental Status: He is alert.     Comments: Delayed responses. Some confusion.            Vital Signs: BP 91/60 (BP Location: Left Arm)   Pulse 99   Temp 98.2 F (36.8 C) (Oral)   Resp 20   Ht 5\' 10"  (1.778 m)   Wt 91.2 kg   SpO2 96%   BMI 28.85 kg/m  SpO2: SpO2: 96 % O2 Device: O2 Device: Nasal Cannula O2 Flow Rate: O2 Flow Rate (L/min): 4 L/min  Intake/output summary:   Intake/Output Summary (Last 24 hours) at 02/10/2020 1454 Last data filed at 02/10/2020 1430 Gross per 24 hour  Intake 1495.74 ml  Output 2500 ml  Net -1004.26 ml   LBM: Last BM Date: 01/09/20 Baseline Weight: Weight: 93.4 kg Most recent weight: Weight: 91.2 kg       Palliative Assessment/Data: PPS 40%      Patient Active Problem List   Diagnosis Date Noted  . Palliative care by specialist   . DNR (do not resuscitate)   . Lethargy   . Transaminitis   . Cardiogenic shock ([place])   . Acute encephalopathy   . CHF (congestive heart failure) ([place]) 11/13/2019  . Acute respiratory failure with hypoxia ([place]) 11/12/2019  . CKD (chronic kidney disease) stage 3, GFR 30-59 ml/min 11/12/2019  . Acute exacerbation of CHF (congestive heart failure) ([place]) 10/22/2019  . CHF exacerbation ([place]) 10/17/2019  . Acute and chronic respiratory failure (acute-on-chronic) ([place]) 09/11/2019  . Sleep apnea   . Hypertension   . AKI (acute kidney injury) ([place])   . CAD  (coronary artery disease) 06/24/2019  . Hypothyroid   . COPD (chronic obstructive pulmonary disease) ([place])   . Depression   . Hyperlipidemia 01/30/2019  . LV (left ventricular) mural thrombus with acute MI ([place]) 01/30/2019  . Alcohol abuse 01/30/2019  . Alcohol withdrawal delirium ([place]) 01/30/2019  . Elevated liver enzymes 01/30/2019  . Ischemic cardiomyopathy 01/30/2019  . Acute on chronic systolic CHF (congestive heart failure) ([place]) 01/30/2019  . History of colonic polyps 04/21/2018  . Barrett's esophagus 04/21/2018  . Barrett's esophagus without dysplasia 04/21/2018  . Rectal bleeding 04/21/2018    Palliative Care Assessment & Plan   Patient Profile: 64 y.o. male  admitted on 02/02/2020 with significant cardiac history of ischemic cardiomyopathy, artery disease status post anterior STEMI in 3 of 2020, ICD , heart failure/EF 15 to 20%, hypertension, hyperlipidemia, depression and substance abuse/EtOH.Marland Kitchen  Assessment: Cardiogenic shock Acute on chronic CHF EF 10% CAD s/p STEMI, PCI 01/2019 Hx of LV thrombus AKI  Recommendations/Plan:  Medically optimize inpatient with plans to transition to hospice services following hospitalization. Pending further discussions with patient/family regarding home with hospice versus hospice facility.   PMT provider hopeful to f/u with patient and friend Tony Alvarez) at bedside 4/3 afternoon.    Code Status: DNR   Code Status Orders  (From admission, onward)         Start     Ordered   02/09/20 1327  Do not attempt resuscitation (DNR)  Continuous    Question Answer Comment  In the event of cardiac or respiratory ARREST Do not call a "code blue"   In the event of cardiac or respiratory ARREST Do not perform Intubation, CPR, defibrillation or ACLS   In the  event of cardiac or respiratory ARREST Use medication by any route, position, wound care, and other measures to relive pain and suffering. May use oxygen, suction and manual treatment of airway  obstruction as needed for comfort.      02/09/20 1326        Code Status History    Date Active Date Inactive Code Status Order ID Comments User Context   02/02/2020 1744 02/09/2020 1326 Full Code [id number]  Marty Heck, DO ED   12/12/2019 1136 12/12/2019 2223 Full Code [id number]  Deboraha Sprang, MD Inpatient   11/12/2019 2246 11/14/2019 1908 Full Code [id number]  Truett Mainland, DO Inpatient   10/22/2019 0251 10/23/2019 1720 Full Code [id number]  Oswald Hillock, MD Inpatient   10/17/2019 2153 10/21/2019 1934 Full Code [id number]  Lynetta Mare, MD ED   09/11/2019 1802 09/13/2019 1928 Full Code [id number]  Murlean Iba, MD ED   06/25/2019 0158 06/25/2019 1711 Full Code [id number]  Vianne Bulls, MD Inpatient   01/26/2019 2326 01/30/2019 2138 Full Code [id number]  Troy Sine, MD Inpatient   01/26/2019 2319 01/26/2019 2326 Full Code [id number]  Doylene Canning, MD Inpatient   Advance Care Planning Activity       Prognosis:   Poor prognosis   Discharge Planning:  To Be Determined  Care plan was discussed with patient, [place] HF PA, friend Tony Alvarez)  Thank you for allowing the Palliative Medicine Team to assist in the care of this patient.   Time In: 1415 Time Out: 1445 Total Time 30 Prolonged Time Billed  no      Greater than 50%  of this time was spent counseling and coordinating care related to the above assessment and plan.  Ihor Dow, DNP, FNP-C Palliative Medicine Team  Phone: [phone] Fax: [phone]  Please contact Palliative Medicine Team phone at [phone] for questions and concerns.

## 2020-02-10 NOTE — Hospital Course (Addendum)
Patient is a 64 year old male with past medical history significant for ischemic cardiomyopathy, STEMI status post PCI in March 2020, HFrEF, hypertension, hyperlipidemia, OSA, and depression who presented with altered mental status, lower extremity edema, and elevated BNP on 02/02/2020.  # Cardiogenic shock # Acute on chronic HFrEF+Diastolic Dsyfunction Patient presented with bilateral pitting lower extremity edema to the knees.  BNP elevated 2899.  Troponins flat at 36 and 42.  Patient had ICD placed on 12/12/2019. Repeat TTE on 02/03/20 revealed TTE on 15-20% and grade 3 diastolic dysfunction (restrictive).  This EF was decreased from 25-30% in October 2020. Outpatient regimen of carvedilol 6.25 mg twice daily, spironolactone 12.5 mg twice daily, Lasix 40 mg daily, and digoxin 0.0625 mg daily - unclear compliance with these medications.   Cardiology was consulted. Patient started on milrinone 0.25 mcg/kg/min evening 3/25 due to hypotension/low output state. Venous O2 sat of 49.6% on 3/26 and milrinone increased to 0.375 mcg/kg/min with improvement in venous O2 saturation. Patient initially placed on lasix drip before transitioning to oral regimen. Patient diuresed well on regimen of 80 mg PO daily and was discharged on ***. Patient had significant improvement in volume status, resolution of lower extremity edema. Cumulative net output of *** during hospitalization and weight of *** on day of discharge. Patient with poor prognosis and palliative care was consulted. Patient elected to be DNR/DNI, deactivate defibrillator, and go home with home-hospice services.***  # Acute kidney injury Creatinine of 1.76 on admission, baseline levels of 1.0-1.3.  Urinalysis with small hemoglobin, 100 protein, 6-10 RBCs, 0-5 WBCs.  Renal ultrasound significant for increased echogenicity likely secondary to medical renal disease.  Suspect that worsening renal function is secondary to vascular congestion versus low output  state.  # Altered mental status # Major depressive disorder Patient with waxing and waning mental status. Psychiatry evaluated patient, they recommended to continue aripiprazole 10 mg daily + clonazepam 1 mg daily which inpatient with plans for outpatient taper and titration to an SSRI. Etiology may be low output heart failure although there was only mild improvement with increased milrinone dosage.  # History of LV thrombus Patient was on xarelto as outpatient for history of LV mural thrombus. This was temporarily transitioned to heparin while inpatient while VAD procedure was being considered. Xarelto was discontinued at discharge due to patient's treatment goals with hospice.   # Elevated AST/ALT AST and ALT elevated to 311 and 611, with high T bili at 3.8 and normal alk phos 98. INR elevated to 1.8, although this may be secondary to Xarelto if patient was compliant. RUQ ultrasound was without acute hepatobiliary disease. LFTs improved during hospitalization with AST/ALT of 32/111 on 02/08/20. Suspect abnormalities secondary to poor perfusion from lower output heart failure. Viral workup for hepatitis B and C was negative.

## 2020-02-10 NOTE — Progress Notes (Signed)
ANTICOAGULATION CONSULT NOTE - Follow Up Consult  Pharmacy Consult for heparin Indication: h/o mural thrombus  Labs: Recent Labs    02/08/20 0505 02/08/20 0505 02/09/20 0440 02/09/20 0530 02/09/20 1136 02/09/20 2140 02/10/20 0500  HGB 10.5*   < > 9.3*  --   --   --  10.9*  HCT 33.8*  --  29.4*  --   --   --  34.6*  PLT 221  --  212  --   --   --  275  HEPARINUNFRC 0.66  --   --    < > 1.02* 0.94* 0.83*  CREATININE 0.90  --  0.87  --   --   --   --    < > = values in this interval not displayed.    Assessment: 64yo male remains supratherapeutic on heparin after rate change though closer to goal; no gtt issues or signs of bleeding per RN.  Goal of Therapy:  Heparin level 0.3-0.7 units/ml   Plan:  Will decrease heparin gtt by 2 units/kg/hr to 1500 units/hr and check level in 6 hours.    Wynona Neat, PharmD, BCPS  02/10/2020,5:45 AM

## 2020-02-10 NOTE — Progress Notes (Addendum)
Subjective:  Patient seen at bedside. Patient is not able to articulate details of conversation yesterday.   Objective:    Vital Signs (last 24 hours): Vitals:   02/09/20 2211 02/10/20 0022 02/10/20 0215 02/10/20 0611  BP: (!) 95/58  101/73 103/65  Pulse: (!) 104  92 82  Resp: 19  19 16   Temp: 98.6 F (37 C)  98.3 F (36.8 C) 98.4 F (36.9 C)  TempSrc: Oral  Oral Oral  SpO2: 98%  99% 95%  Weight:  91.2 kg    Height:        Physical Exam: General Alert and responds to questions, states city is [place], year is 2010, does not know hospital  Cardiac Regular rate and rhythm, no murmurs, rubs, or gallops  Pulmonary Clear to auscultation bilaterally without wheezes, rhonchi, or rales  Extremities No peripheral edema    CBC Latest Ref Rng & Units 02/10/2020 02/09/2020 02/08/2020  WBC 4.0 - 10.5 K/uL 8.1 6.3 9.0  Hemoglobin 13.0 - 17.0 g/dL 10.9(L) 9.3(L) 10.5(L)  Hematocrit 39.0 - 52.0 % 34.6(L) 29.4(L) 33.8(L)  Platelets 150 - 400 K/uL 275 212 221   BMP Latest Ref Rng & Units 02/10/2020 02/09/2020 02/08/2020  Glucose 70 - 99 mg/dL 99 253(H) 100(H)  BUN 8 - 23 mg/dL 11 12 11   Creatinine 0.61 - 1.24 mg/dL 1.08 0.87 0.90  BUN/Creat Ratio 10 - 24 - - -  Sodium 135 - 145 mmol/L 136 130(L) 135  Potassium 3.5 - 5.1 mmol/L 3.9 3.6 3.9  Chloride 98 - 111 mmol/L 99 97(L) 100  CO2 22 - 32 mmol/L 26 24 27   Calcium 8.9 - 10.3 mg/dL 9.8 8.7(L) 9.3   Mag: 2.4  Net Output: -846 ml Body Mass Trend: 93.3 -> 93.6 -> 91.2 kg  Assessment/Plan:   Principal Problem:   Cardiogenic shock (HCC) Active Problems:   Acute exacerbation of CHF (congestive heart failure) (HCC)   Acute encephalopathy   Transaminitis   Palliative care by specialist   DNR (do not resuscitate)   Lethargy  Patient is a 64 year old male with past medical history significant for ischemic cardiomyopathy status post STEMI and PCI in March 2020, HFrEF, hypertension, hyperlipidemia, OSA, and depression who presented with  altered mental status, lower extremity edema, and elevated BNP on 02/02/2020.  # Cardiogenic shock # Acute on chronic HFrEF+diastolic dysfunction Patient with EF 15-20% and grade 3 diastolic dysfunction (restrictive), patient remains inotrope dependent.  Patient was given 80 mg p.o. Lasix yesterday, net output of 846 ml.  Patient's renal function and electrolytes are stable. *Advanced heart failure following, appreciate their continued recommendations *Inotrope dependent *Continue Lasix 80 mg p.o. daily *Continue milrinone 0.375 mix per kick per minute.  Patient failing milrinone wean.  That not option secondary to mental status *Continue digoxin 0.125 mg daily + spironolactone 25 mg daily (increased from 12.5 yesterday) *Blood pressure too low for ARB/ARNI *Strict I/O's, daily weights *Palliative care evaluated patient - patient now DNR. Patient and family support decision for ICD deactivation. Plan to go home with home hospice services  # AMS # MDD Patient with waxing and waning mental status.  May be in part secondary to low output heart failure.  Continue aripiprazole 10 mg daily + clonazepam 1 mg daily  # Hx of LV thrombus * Will transition back to xarelto per pharmacy consult and stop heparin  # Normocytic anemia Hemoglobin stable at 10.9 today. Will continue to monitor *B12 high at 1077, MMA pending *Ferritin 49, iron 25, TIBC  318, with low saturation ratio at 8 *IV iron given on 02/08/2020  # CAD s/p STEMI, pCI 01/2019 w LAD stent: * Plavix stopped on 3/28 for potential placement of VAC * Continue  PT/OT: PT recommends HHPT, OT pending Diet: Regular DVT Ppx: On Xarelto Admit Status: Inpatient Dispo: Anticipated discharge pending arrangement of home hospice  Jeanmarie Hubert, MD 02/10/2020, 6:33 AM

## 2020-02-11 DIAGNOSIS — Z95810 Presence of automatic (implantable) cardiac defibrillator: Secondary | ICD-10-CM

## 2020-02-11 DIAGNOSIS — Z7189 Other specified counseling: Secondary | ICD-10-CM

## 2020-02-11 LAB — BASIC METABOLIC PANEL
Anion gap: 9 (ref 5–15)
BUN: 14 mg/dL (ref 8–23)
CO2: 27 mmol/L (ref 22–32)
Calcium: 9.6 mg/dL (ref 8.9–10.3)
Chloride: 99 mmol/L (ref 98–111)
Creatinine, Ser: 0.96 mg/dL (ref 0.61–1.24)
GFR calc Af Amer: >60 mL/min (ref >60)
GFR calc non Af Amer: >60 mL/min (ref >60)
Glucose, Bld: 183 mg/dL — ABNORMAL HIGH (ref 70–99)
Potassium: 4.1 mmol/L (ref 3.5–5.1)
Sodium: 135 mmol/L (ref 135–145)

## 2020-02-11 LAB — COOXEMETRY PANEL
Carboxyhemoglobin: 1.3 % (ref 0.5–1.5)
Methemoglobin: 1.3 % (ref 0.0–1.5)
O2 Saturation: 59.8 %
Total hemoglobin: 11.1 g/dL — ABNORMAL LOW (ref 12.0–16.0)

## 2020-02-11 LAB — CBC
HCT: 33.8 % — ABNORMAL LOW (ref 39.0–52.0)
Hemoglobin: 10.4 g/dL — ABNORMAL LOW (ref 13.0–17.0)
MCH: 27.2 pg (ref 26.0–34.0)
MCHC: 30.8 g/dL (ref 30.0–36.0)
MCV: 88.5 fL (ref 80.0–100.0)
Platelets: 249 10*3/uL (ref 150–400)
RBC: 3.82 MIL/uL — ABNORMAL LOW (ref 4.22–5.81)
RDW: 17.5 % — ABNORMAL HIGH (ref 11.5–15.5)
WBC: 8.4 10*3/uL (ref 4.0–10.5)
nRBC: 0.6 % — ABNORMAL HIGH (ref 0.0–0.2)

## 2020-02-11 LAB — GLUCOSE, CAPILLARY
Glucose-Capillary: 101 mg/dL — ABNORMAL HIGH (ref 70–99)
Glucose-Capillary: 168 mg/dL — ABNORMAL HIGH (ref 70–99)
Glucose-Capillary: 116 mg/dL — ABNORMAL HIGH (ref 70–99)
Glucose-Capillary: 118 mg/dL — ABNORMAL HIGH (ref 70–99)

## 2020-02-11 LAB — MAGNESIUM: Magnesium: 2 mg/dL (ref 1.7–2.4)

## 2020-02-11 MED ORDER — CLOPIDOGREL BISULFATE 75 MG PO TABS
75.0000 mg | ORAL_TABLET | Freq: Every day | ORAL | Status: DC
  Administered 2020-02-11 – 2020-02-14 (×4): 75 mg via ORAL
  Filled 2020-02-11 (×4): qty 1

## 2020-02-11 NOTE — Plan of Care (Signed)
  Problem: Education: Goal: Ability to demonstrate management of disease process will improve Outcome: Progressing Goal: Ability to verbalize understanding of medication therapies will improve Outcome: Progressing Goal: Individualized Educational Video(s) Outcome: Progressing   Problem: Cardiac: Goal: Ability to achieve and maintain adequate cardiopulmonary perfusion will improve Outcome: Progressing   Problem: Education: Goal: Knowledge of General Education information will improve Description: Including pain rating scale, medication(s)/side effects and non-pharmacologic comfort measures Outcome: Progressing   Problem: Health Behavior/Discharge Planning: Goal: Ability to manage health-related needs will improve Outcome: Progressing   Problem: Clinical Measurements: Goal: Ability to maintain clinical measurements within normal limits will improve Outcome: Progressing Goal: Will remain free from infection Outcome: Progressing Goal: Diagnostic test results will improve Outcome: Progressing Goal: Respiratory complications will improve Outcome: Progressing Goal: Cardiovascular complication will be avoided Outcome: Progressing   Problem: Activity: Goal: Risk for activity intolerance will decrease Outcome: Progressing   Problem: Coping: Goal: Level of anxiety will decrease Outcome: Progressing   Problem: Elimination: Goal: Will not experience complications related to bowel motility Outcome: Progressing Goal: Will not experience complications related to urinary retention Outcome: Progressing   Problem: Pain Managment: Goal: General experience of comfort will improve Outcome: Progressing   Problem: Safety: Goal: Ability to remain free from injury will improve Outcome: Progressing   Problem: Skin Integrity: Goal: Risk for impaired skin integrity will decrease Outcome: Progressing

## 2020-02-11 NOTE — Progress Notes (Signed)
Subjective:  Patient seen at bedside. Patient unable to recall yesterday's conversation. Patient denies pain. Patient counseled on plan of care, all questions answered.   Objective:   Vital Signs (last 24 hours): Vitals:   02/10/20 1603 02/10/20 2100 02/11/20 0100 02/11/20 0120  BP: 94/68 97/68  107/72  Pulse: (!) 101 (!) 101    Resp: 20 20 20 20   Temp: 98 F (36.7 C) 98.3 F (36.8 C)  98.3 F (36.8 C)  TempSrc: Oral Oral  Oral  SpO2: 98% 98%    Weight:      Height:        Physical Exam: General Alert and responds to questions, no acute distress  Cardiac Regular rate and rhythm, no murmurs, rubs, or gallops  Pulmonary Clear to auscultation bilaterally without wheezes, rhonchi, or rales  Extremities No peripheral edema   CBC Latest Ref Rng & Units 02/11/2020 02/10/2020 02/09/2020  WBC 4.0 - 10.5 K/uL 8.4 8.1 6.3  Hemoglobin 13.0 - 17.0 g/dL 10.4(L) 10.9(L) 9.3(L)  Hematocrit 39.0 - 52.0 % 33.8(L) 34.6(L) 29.4(L)  Platelets 150 - 400 K/uL 249 275 212   BMP Latest Ref Rng & Units 02/11/2020 02/10/2020 02/09/2020  Glucose 70 - 99 mg/dL 183(H) 99 253(H)  BUN 8 - 23 mg/dL 14 11 12   Creatinine 0.61 - 1.24 mg/dL 0.96 1.08 0.87  BUN/Creat Ratio 10 - 24 - - -  Sodium 135 - 145 mmol/L 135 136 130(L)  Potassium 3.5 - 5.1 mmol/L 4.1 3.9 3.6  Chloride 98 - 111 mmol/L 99 99 97(L)  CO2 22 - 32 mmol/L 27 26 24   Calcium 8.9 - 10.3 mg/dL 9.6 9.8 8.7(L)   Magnesium: 2.0  [place]: 59.8 from 59.3  Net output: 1058.3 ml   Assessment/Plan:   Principal Problem:   Cardiogenic shock (HCC) Active Problems:   Acute exacerbation of CHF (congestive heart failure) ([place])   Acute encephalopathy   Transaminitis   Palliative care by specialist   DNR (do not resuscitate)   Lethargy  Patient is a 64 year old male with past medical history significant for ischemic cardiomyopathy, STEMI status post PCI in March 2020, HFrEF, hypertension, hyperlipidemia, OSA, and depression who presented with altered  mental status, lower extremity edema, and elevated BNP on 02/02/2020.  # Cardiogenic shock # Acute on chronic HFrEF+diastolic dysfunction Patient with EF 15-20% + grade 3 diastolic dysfunction, remains inotrope dependent. *Advanced heart failure following, we appreciate their continued recommendations *Palliative care evaluated patient, patient is DNR, ICD deactivated, plan is to go to residential hospice.  Patient is on milrinone 0.375 mg/kg/min.  Given goals of care with hospice, goal is to discontinue milrinone before discharge to residential hospice. Will discuss with palliative team *Continue digoxin 0.125 mg daily + spironolactone 25 mg daily *Continue Lasix 80 mg daily *Strict I/O's, daily weights  # AMS # MDD Patient with waxing and waning mental status.  May be in part secondary to low output heart failure.  Continue aripiprazole 10 mg daily + clonazepam 1 mg daily  # Hx of LV thrombus * On Xarelto  # Normocytic anemia Hemoglobin stable at 10.9 today. Will continue to monitor *B12 high at 1077, MMA pending *Ferritin 49, iron 25, TIBC 318, with low saturation ratio at 8 *IV iron given on 02/08/2020  # CAD s/p STEMI, pCI 01/2019 w LAD stent: * Plavix stopped on 3/28 for potential placement of VAD, plavix restarted  PT/OT: HHPT and HHOT, DME 3 N 1 (ordered) Diet: Regular DVT Ppx: On Xarelto (see above)  Admit Status: Inpatient Dispo: Anticipated discharge pending arrangement of residential hospice  Jeanmarie Hubert, MD 02/11/2020, 6:02 AM

## 2020-02-11 NOTE — Progress Notes (Signed)
Daily Progress Note   Patient Name: Tony Alvarez       Date: 02/11/2020 DOB: 1955/12/21  Age: 64 y.o. MRN#: [id number] Attending Physician: Aldine Contes, MD Primary Care Physician: [facility] Admit Date: 02/02/2020  Reason for Consultation/Follow-up: Establishing goals of care  Subjective: Patient awake but drowsy. Throughout visit, he continues to shift from bed to chair. Irritable with pleasant confusion. Able to participate in discussion but delayed responses and do not think he understands complexity of condition and decision making.   GOC: Thomasenia Bottoms at bedside. Jisaiah and Truman Hayward have been friends for >25 years. Truman Hayward shares that he is moving in with Jezreel and plans to provide 24/7 care.   Discussed course of hospitalization including diagnoses, interventions, plan of care and recommendations from heart failure team, including that he is not a candidate for LVAD.   Truman Hayward is frustrated because he has not had the chance to talk with providers this admission (besides palliative provider, Stanton Kidney). Truman Hayward requesting more answers and communication. He is requesting to speak with heart failure team about LVAD and confident that he could provide support to Arvie moving forward if he was a candidate for LVAD.   Explored whether Mehtab would want to pursue LVAD if the HF team did decide this was an option. He states "I don't see why not." Explained complexity of LVAD workup, recovery, and social support following hospitalization.   Therapeutic listening and emotional support provided. Answered questions. Reassured of communication with attending and Dr. Haroldine Laws regarding plan of care. PMT contact information given.   Length of Stay: 9  Current Medications: Scheduled Meds:  .  ARIPiprazole  10 mg Oral Daily  . aspirin EC  81 mg Oral Daily  . atorvastatin  80 mg Oral q1800  . Chlorhexidine Gluconate Cloth  6 each Topical Daily  . clonazePAM  0.5 mg Oral BID  . clopidogrel  75 mg Oral Daily  . digoxin  0.125 mg Oral Daily  . feeding supplement (GLUCERNA SHAKE)  237 mL Oral TID BM  . folic acid  1 mg Oral Daily  . furosemide  80 mg Oral Daily  . insulin aspart  0-6 Units Subcutaneous TID WC  . levothyroxine  125 mcg Oral QAC breakfast  . multivitamin with minerals  1 tablet Oral Daily  .  pantoprazole  40 mg Oral Daily  . rivaroxaban  20 mg Oral Daily  . sodium chloride flush  10-40 mL Intracatheter Q12H  . spironolactone  25 mg Oral Daily    Continuous Infusions: . sodium chloride 250 mL (02/09/20 0839)  . milrinone 0.375 mcg/kg/min (02/11/20 1056)    PRN Meds: sodium chloride, albuterol, sodium chloride flush  Physical Exam Vitals and nursing note reviewed.  Constitutional:      General: He is awake.     Appearance: He is ill-appearing.  Cardiovascular:     Rate and Rhythm: Regular rhythm.  Pulmonary:     Comments: Dyspnea at rest Skin:    General: Skin is warm and dry.  Neurological:     Mental Status: He is alert.     Comments: Delayed responses. Some confusion.            Vital Signs: BP 90/66 (BP Location: Left Arm)   Pulse 89   Temp 98 F (36.7 C) (Oral)   Resp (!) 21   Ht 5\' 10"  (1.778 m)   Wt 91.2 kg   SpO2 99%   BMI 28.85 kg/m  SpO2: SpO2: 99 % O2 Device: O2 Device: Nasal Cannula O2 Flow Rate: O2 Flow Rate (L/min): 2 L/min  Intake/output summary:   Intake/Output Summary (Last 24 hours) at 02/11/2020 1724 Last data filed at 02/11/2020 1300 Gross per 24 hour  Intake 1220 ml  Output 1750 ml  Net -530 ml   LBM: Last BM Date: 02/10/20 Baseline Weight: Weight: 93.4 kg Most recent weight: Weight: 91.2 kg       Palliative Assessment/Data: PPS 40%      Patient Active Problem List   Diagnosis Date Noted  . Palliative  care by specialist   . DNR (do not resuscitate)   . Lethargy   . Transaminitis   . Cardiogenic shock ([place])   . Acute encephalopathy   . CHF (congestive heart failure) ([place]) 11/13/2019  . Acute respiratory failure with hypoxia ([place]) 11/12/2019  . CKD (chronic kidney disease) stage 3, GFR 30-59 ml/min 11/12/2019  . Acute exacerbation of CHF (congestive heart failure) ([place]) 10/22/2019  . CHF exacerbation ([place]) 10/17/2019  . Acute and chronic respiratory failure (acute-on-chronic) ([place]) 09/11/2019  . Sleep apnea   . Hypertension   . AKI (acute kidney injury) ([place])   . CAD (coronary artery disease) 06/24/2019  . Hypothyroid   . COPD (chronic obstructive pulmonary disease) ([place])   . Depression   . Hyperlipidemia 01/30/2019  . LV (left ventricular) mural thrombus with acute MI ([place]) 01/30/2019  . Alcohol abuse 01/30/2019  . Alcohol withdrawal delirium ([place]) 01/30/2019  . Elevated liver enzymes 01/30/2019  . Ischemic cardiomyopathy 01/30/2019  . Acute on chronic systolic CHF (congestive heart failure) ([place]) 01/30/2019  . History of colonic polyps 04/21/2018  . Barrett's esophagus 04/21/2018  . Barrett's esophagus without dysplasia 04/21/2018  . Rectal bleeding 04/21/2018    Palliative Care Assessment & Plan   Patient Profile: 64 y.o. male  admitted on 02/02/2020 with significant cardiac history of ischemic cardiomyopathy, artery disease status post anterior STEMI in 3 of 2020, ICD , heart failure/EF 15 to 20%, hypertension, hyperlipidemia, depression and substance abuse/EtOH.Marland Kitchen  Assessment: Cardiogenic shock Acute on chronic CHF EF 10% CAD s/p STEMI, PCI 01/2019 Hx of LV thrombus AKI  Recommendations/Plan:  Continue current plan of care and medical management.   Ongoing [place] discussions. GOC are NOT yet clear as patient's friend Truman Hayward) is requesting further discussion with HF  team regarding LVAD. Truman Hayward is willing to provide 24/7 caregiver support in the home setting and feels  confident he could care for Mikhael if LVAD was reconsidered.  Spoke with Dr. Haroldine Laws. We plan to meet with patient and Truman Hayward tomorrow morning 4/4 ~1030am. Will plan for brother [name]) on speaker phone.    Code Status: DNR   Code Status Orders  (From admission, onward)         Start     Ordered   02/09/20 1327  Do not attempt resuscitation (DNR)  Continuous    Question Answer Comment  In the event of cardiac or respiratory ARREST Do not call a "code blue"   In the event of cardiac or respiratory ARREST Do not perform Intubation, CPR, defibrillation or ACLS   In the event of cardiac or respiratory ARREST Use medication by any route, position, wound care, and other measures to relive pain and suffering. May use oxygen, suction and manual treatment of airway obstruction as needed for comfort.      02/09/20 1326        Code Status History    Date Active Date Inactive Code Status Order ID Comments User Context   02/02/2020 1744 02/09/2020 1326 Full Code [id number]  Marty Heck, DO ED   12/12/2019 1136 12/12/2019 2223 Full Code [id number]  Deboraha Sprang, MD Inpatient   11/12/2019 2246 11/14/2019 1908 Full Code [id number]  Truett Mainland, DO Inpatient   10/22/2019 0251 10/23/2019 1720 Full Code [id number]  Oswald Hillock, MD Inpatient   10/17/2019 2153 10/21/2019 1934 Full Code [id number]  Lynetta Mare, MD ED   09/11/2019 1802 09/13/2019 1928 Full Code [id number]  Murlean Iba, MD ED   06/25/2019 0158 06/25/2019 1711 Full Code [id number]  Vianne Bulls, MD Inpatient   01/26/2019 2326 01/30/2019 2138 Full Code [id number]  Troy Sine, MD Inpatient   01/26/2019 2319 01/26/2019 2326 Full Code [id number]  Doylene Canning, MD Inpatient   Advance Care Planning Activity       Prognosis:   Poor prognosis   Discharge Planning:  To Be Determined  Care plan was discussed with patient, Truman Hayward (friend), brother, RN, Dr. Haroldine Laws, Dr Dareen Piano  Thank you for allowing the Palliative  Medicine Team to assist in the care of this patient.   Time In: 1610 Time Out: 1645 Total Time 35 Prolonged Time Billed  no      Greater than 50%  of this time was spent counseling and coordinating care related to the above assessment and plan.  Ihor Dow, DNP, FNP-C Palliative Medicine Team  Phone: [phone] Fax: [phone]  Please contact Palliative Medicine Team phone at [phone] for questions and concerns.

## 2020-02-12 LAB — BASIC METABOLIC PANEL
Anion gap: 10 (ref 5–15)
BUN: 13 mg/dL (ref 8–23)
CO2: 27 mmol/L (ref 22–32)
Calcium: 9.5 mg/dL (ref 8.9–10.3)
Chloride: 98 mmol/L (ref 98–111)
Creatinine, Ser: 0.9 mg/dL (ref 0.61–1.24)
GFR calc Af Amer: >60 mL/min (ref >60)
GFR calc non Af Amer: >60 mL/min (ref >60)
Glucose, Bld: 166 mg/dL — ABNORMAL HIGH (ref 70–99)
Potassium: 3.8 mmol/L (ref 3.5–5.1)
Sodium: 135 mmol/L (ref 135–145)

## 2020-02-12 LAB — COOXEMETRY PANEL
Carboxyhemoglobin: 1 % (ref 0.5–1.5)
Methemoglobin: 0.6 % (ref 0.0–1.5)
O2 Saturation: 58.1 %
Total hemoglobin: 11.2 g/dL — ABNORMAL LOW (ref 12.0–16.0)

## 2020-02-12 LAB — CBC
HCT: 35.4 % — ABNORMAL LOW (ref 39.0–52.0)
Hemoglobin: 10.9 g/dL — ABNORMAL LOW (ref 13.0–17.0)
MCH: 27.1 pg (ref 26.0–34.0)
MCHC: 30.8 g/dL (ref 30.0–36.0)
MCV: 88.1 fL (ref 80.0–100.0)
Platelets: 269 10*3/uL (ref 150–400)
RBC: 4.02 MIL/uL — ABNORMAL LOW (ref 4.22–5.81)
RDW: 17.8 % — ABNORMAL HIGH (ref 11.5–15.5)
WBC: 8.7 10*3/uL (ref 4.0–10.5)
nRBC: 0.3 % — ABNORMAL HIGH (ref 0.0–0.2)

## 2020-02-12 LAB — GLUCOSE, CAPILLARY
Glucose-Capillary: 102 mg/dL — ABNORMAL HIGH (ref 70–99)
Glucose-Capillary: 125 mg/dL — ABNORMAL HIGH (ref 70–99)
Glucose-Capillary: 112 mg/dL — ABNORMAL HIGH (ref 70–99)
Glucose-Capillary: 128 mg/dL — ABNORMAL HIGH (ref 70–99)

## 2020-02-12 LAB — MAGNESIUM: Magnesium: 1.9 mg/dL (ref 1.7–2.4)

## 2020-02-12 MED ORDER — MORPHINE SULFATE (CONCENTRATE) 10 MG/0.5ML PO SOLN
2.5000 mg | Freq: Four times a day (QID) | ORAL | Status: DC
  Administered 2020-02-12 – 2020-02-14 (×8): 2.6 mg via ORAL
  Filled 2020-02-12 (×8): qty 0.5

## 2020-02-12 MED ORDER — MORPHINE SULFATE (CONCENTRATE) 10 MG/0.5ML PO SOLN: 5.0000 mg | ORAL | Status: DC | PRN

## 2020-02-12 NOTE — Progress Notes (Signed)
Advanced Heart Failure Rounding Note  PCP-Cardiologist: Shelva Majestic, MD   Subjective:    Echo EF 10% RV mild to moderately down   On milrinone 0.375 mcg. CO-OX 58%.  CVP 1-2  Remains confused. Can tell me he is int he hospital but not which one. Doesn't know which city he is in. Denies SOB, orthopnea or PND  Palliative care has discussed with his family and they have said he is unable to care for himself and they wishe to proceed w/ home hospice. ICD deactivated.   Tony Alvarez) has been identified who is now saying he will care for him and asking why he can't get VAD   Objective:   Weight Range: 91.2 kg Body mass index is 28.85 kg/m.   Vital Signs:   Temp:  [97.4 F (36.3 C)-98.3 F (36.8 C)] 98.3 F (36.8 C) (04/04 0330) Pulse Rate:  [89-104] 100 (04/04 0820) Resp:  [0-32] 14 (04/04 0330) BP: (82-115)/(52-84) 105/65 (04/04 0330) SpO2:  [90 %-99 %] 90 % (04/04 0330) Last BM Date: 02/12/20  Weight change: Filed Weights   02/08/20 0600 02/09/20 0044 02/10/20 0022  Weight: 93.3 kg 93.6 kg 91.2 kg    Intake/Output:   Intake/Output Summary (Last 24 hours) at 02/12/2020 1030 Last data filed at 02/12/2020 0814 Gross per 24 hour  Intake 1150 ml  Output 1900 ml  Net -750 ml      Physical Exam   CVP 2 General:  Weak appearing. Disshelved. confused No resp difficulty HEENT: normal Neck: supple. no JVD. Carotids 2+ bilat; no bruits. No lymphadenopathy or thryomegaly appreciated. Cor: PMI nondisplaced. Regular tachy + s3 Lungs: clear Abdomen: soft, nontender, nondistended. No hepatosplenomegaly. No bruits or masses. Good bowel sounds. Extremities: no cyanosis, clubbing, rash, edema Neuro: confused. Moves all 4 extremities. Flat affect     Telemetry   Sinus tach 100-110 Personally reviewed  Labs    CBC Recent Labs    02/11/20 0500 02/12/20 0423  WBC 8.4 8.7  HGB 10.4* 10.9*  HCT 33.8* 35.4*  MCV 88.5 88.1  PLT 249 [id number]   Basic Metabolic  Panel Recent Labs    02/11/20 0500 02/12/20 0423  NA 135 135  K 4.1 3.8  CL 99 98  CO2 27 27  GLUCOSE 183* 166*  BUN 14 13  CREATININE 0.96 0.90  CALCIUM 9.6 9.5  MG 2.0 1.9   Liver Function Tests No results for input(s): AST, ALT, ALKPHOS, BILITOT, PROT, ALBUMIN in the last 72 hours. No results for input(s): LIPASE, AMYLASE in the last 72 hours. Cardiac Enzymes No results for input(s): CKTOTAL, CKMB, CKMBINDEX, TROPONINI in the last 72 hours.  BNP: BNP (last 3 results) Recent Labs    12/02/19 1146 12/14/19 1048 02/02/20 1343  BNP 656.0* 438.6* 2,899.9*    ProBNP (last 3 results) Recent Labs    07/26/19 1102  PROBNP 992*     D-Dimer No results for input(s): DDIMER in the last 72 hours. Hemoglobin A1C No results for input(s): HGBA1C in the last 72 hours. Fasting Lipid Panel No results for input(s): CHOL, HDL, LDLCALC, TRIG, CHOLHDL, LDLDIRECT in the last 72 hours. Thyroid Function Tests No results for input(s): TSH, T4TOTAL, T3FREE, THYROIDAB in the last 72 hours.  Invalid input(s): FREET3  Other results:   Imaging    No results found.   Medications:     Scheduled Medications: . ARIPiprazole  10 mg Oral Daily  . aspirin EC  81 mg Oral Daily  . atorvastatin  80 mg Oral q1800  . Chlorhexidine Gluconate Cloth  6 each Topical Daily  . clonazePAM  0.5 mg Oral BID  . clopidogrel  75 mg Oral Daily  . digoxin  0.125 mg Oral Daily  . feeding supplement (GLUCERNA SHAKE)  237 mL Oral TID BM  . folic acid  1 mg Oral Daily  . furosemide  80 mg Oral Daily  . insulin aspart  0-6 Units Subcutaneous TID WC  . levothyroxine  125 mcg Oral QAC breakfast  . multivitamin with minerals  1 tablet Oral Daily  . pantoprazole  40 mg Oral Daily  . rivaroxaban  20 mg Oral Daily  . sodium chloride flush  10-40 mL Intracatheter Q12H  . spironolactone  25 mg Oral Daily    Infusions: . sodium chloride 250 mL (02/09/20 0839)  . milrinone 0.375 mcg/kg/min (02/12/20  0706)    PRN Medications: sodium chloride, albuterol, sodium chloride flush     Assessment/Plan   1. Cardiogenic Shock  -Has ICD - now deactivated. Progressing to comfort care.  - ECHO this admit EF 10% with RV mild moderately reduce. In 2020 EF 25-30%  - Lactic Acid 2.9 on admit -CO-OX 58% on milrinone  0.375 mcg.  -CVP 2. Volume status stable. Continue lasix 80 mg po daily.   - Hold beta blocker due to low output state.  - Continue digoxin 0.125 - Continue spiro 25 mg daily.  - BP too low for ARB/ARNI - Renal function stable.  - Shock improved with milrinone. He has failed milrinone wean several times. After long discussions with VAD team and family it has been decided that he is not a candidate for mechanical support due to multiple reasons including ongoing confusion, longstanding proven inability to care for himself and lack of stable support system. This has been discussed with his family in detail and documented well in the chart. He has been referred to Hospice. ICD has been de-activated.  - A friend has now been identified who is offering to provide care but given ongoing delirium without reversible cause, he is still not a candidate for VAD therapy.  - Meeting with friend and Palliative Care today - Continue milrinone until discharged to Hospice for comfoprt  2. CAD s/p anterior STEMI, PCI 01/2019 - No current CP - Continue ASA + statin.    3. History of LV Thrombus - On Xarelto as outpatient.   4. AKI  - In the setting of low output HF.   - Creatinine peaked at 1.5 - Resolved.   5. Hypothyroidism  - On levothyroxine.  6. AMS - CT of head was negative.  - UDS negative.  - CO-OX improved on higher dose of milrinone but remains confused?  - Has h/o major depressive disorder.   7. Hypokalemia -resolved. K 3.8  8. Hypomagnesemia -MAg 1.9   Length of Stay: [place], MD  02/12/2020, 10:30 AM  Advanced Heart Failure Team Pager [phone] (M-F;  7a - 4p)  Please contact [facility] for night-coverage after hours (4p -7a ) and weekends on [email or web address]

## 2020-02-12 NOTE — Progress Notes (Signed)
   Subjective:  Tony Alvarez is a 64 y.o. M w/ PMH of ischemic cardiomyopathy, STEMI s/p PCI, HFrEF (25-30%), OSA, HTN, HLD and depression admit for AMS on hospital day 10  Tony Alvarez was examined and evaluated at bedside this am. He was observed resting comfortably. He is AAOx2 to name and year. He denies any chest pain, palpitations or dyspnea. He mentions that his family meeting did not reach a conclusion and states he is unsure of next steps. Discussed plan for repeat family meeting later today.  Objective:  Vital signs in last 24 hours: Vitals:   02/12/20 0145 02/12/20 0300 02/12/20 0330 02/12/20 0820  BP: 104/84 (!) 110/54 105/65   Pulse:    100  Resp:   14   Temp:   98.3 F (36.8 C)   TempSrc:   Oral   SpO2:   90%   Weight:      Height:       Gen: Well-developed, well nourished, NAD CV: RRR, S1, S2 normal, No rubs, no murmurs, no gallops Pulm: CTAB, No rales, no wheezes Extm: ROM intact, Peripheral pulses intact, No peripheral edema Skin: Dry, Warm, normal turgor, no rashes, lesions, wounds.  Neuro: AAOx2  Assessment/Plan:  Principal Problem:   Cardiogenic shock (HCC) Active Problems:   Acute exacerbation of CHF (congestive heart failure) ([place])   Acute encephalopathy   Transaminitis   Palliative care by specialist   DNR (do not resuscitate)   Lethargy   Goals of care, counseling/discussion  Tony Alvarez is a 64 y.o. M w/ PMH of ischemic cardiomyopathy, STEMI s/p PCI, HFrEF (25-30%), OSA, HTN, HLD and depression admit for low output heart failure  Acute on chronic combined systolic diastolic heart failure EF 15-20%. Inotrope dependent. Not LVAD candidate. Palliative on board. Family friend would like to pursue aggressive options. Family meeting to clarify goals of care scheduled for 10:30am today. - C/w digoxin 0.125mg  daily, spironolactone 25mg  daily, po furosemide 80mg  daily - I/Os, Daily weights - Goals of care conversation  Major Depressive  Disorder Presenting with waxing and waning mental status.Psych on board - c/w aripiprazole 10 mg daily + clonazepam 1 mg daily  LV thrombus On anticoagulation. No evidence of bleeding this am - C/w Xarelto  Normocytic anemia hgbstable at 10.9 today. B12 1077, MMA pending. Ferritin 49, Iron 25, TIBC 318, Sat 8 - Trend cbc  CAD s/p STEMI, pCI 01/2019 w LAD stent: - C/w clopidogrel  DVT prophx: Xarelto Diet: Regular Bowel: N/A Code: DNR  Dispo: Anticipated discharge in approximately 2 day(s).   Mosetta Anis, MD 02/12/2020, 9:38 AM Pager: [phone]

## 2020-02-12 NOTE — Progress Notes (Signed)
  I met with patient's lifelong friend Truman Hayward) in person and patient's brother [name]) by phone. Dr. Truman Hayward from the [facility] and Ihor Dow, NP from [place] also participated.   I updated them on the patient's course and the fact that he underwent formal evaluation for VAD and was turned down.   We discussed the natural history of advanced HF and the fact that he likely only has weeks to live.  After the discussion, it was decided that the plan would be home with Hospice services (no milrinone) either tomorrow or Tuesday.  Plan:  1. Continue milrinone until hospital discharge 2. Discharge home with home hospice (no milrinone) 3. DNR/DNI (ICD has been deactivated) 4. I will arrange f/u appt in HF Clinic in several weeks to see if there has been any improvement.  Additional 40 mins time.   Glori Bickers, MD  12:35 PM

## 2020-02-12 NOTE — Plan of Care (Signed)
  Problem: Education: Goal: Ability to demonstrate management of disease process will improve Outcome: Progressing Goal: Ability to verbalize understanding of medication therapies will improve Outcome: Progressing Goal: Individualized Educational Video(s) Outcome: Progressing   Problem: Cardiac: Goal: Ability to achieve and maintain adequate cardiopulmonary perfusion will improve Outcome: Progressing   Problem: Education: Goal: Knowledge of General Education information will improve Description: Including pain rating scale, medication(s)/side effects and non-pharmacologic comfort measures Outcome: Progressing   Problem: Health Behavior/Discharge Planning: Goal: Ability to manage health-related needs will improve Outcome: Progressing   Problem: Clinical Measurements: Goal: Ability to maintain clinical measurements within normal limits will improve Outcome: Progressing Goal: Will remain free from infection Outcome: Progressing Goal: Diagnostic test results will improve Outcome: Progressing Goal: Respiratory complications will improve Outcome: Progressing Goal: Cardiovascular complication will be avoided Outcome: Progressing   Problem: Activity: Goal: Risk for activity intolerance will decrease Outcome: Progressing   Problem: Coping: Goal: Level of anxiety will decrease Outcome: Progressing   Problem: Elimination: Goal: Will not experience complications related to bowel motility Outcome: Progressing Goal: Will not experience complications related to urinary retention Outcome: Progressing   Problem: Pain Managment: Goal: General experience of comfort will improve Outcome: Progressing   Problem: Safety: Goal: Ability to remain free from injury will improve Outcome: Progressing   Problem: Skin Integrity: Goal: Risk for impaired skin integrity will decrease Outcome: Progressing

## 2020-02-12 NOTE — Progress Notes (Signed)
   02/12/20 0012  MEWS Score  MEWS Temp 0  MEWS Systolic 1  MEWS Pulse 1  MEWS RR 1  MEWS LOC 0  MEWS Score 3  MEWS Score Color Yellow    MD aware. Will monitor VS. Pt on milrinone gtt.   Current VS as follows: Blood pressure 115/64, pulse (!) 104, temperature (!) 97.4 F (36.3 C), temperature source Oral, resp. rate 20, height 5\' 10"  (1.778 m), weight 91.2 kg, SpO2 98 %.

## 2020-02-12 NOTE — Progress Notes (Signed)
Participated in family meeting with Dr.Bensimhon, Ihor Dow with Palliative, [name], Tony Alvarez's family friend, and Tony Alvarez, patient's brother regarding Tony Alvarez's goals of care. Discussed severity of his illness and interventions performed during his hospitalization as well as formal evaluation by LVAD team and finalized assessment that Tony Alvarez is not an LVAD candidate. Both Tony Alvarez and Tony Alvarez expressed understanding and agrees with his discharge with hospice services. Discussed residential vs home hospice and per family request decision was made to pursue home hospice at this time with understanding that he may be transitioned to residential hospice if his needs are not adequately met at home.

## 2020-02-12 NOTE — TOC Progression Note (Signed)
Transition of Care [facility]) - Progression Note    Patient Details  Name: Tony Alvarez MRN: [id number] Date of Birth: [birth year]  Transition of [facility]) CM/SW Contact  Pollie Friar, RN Phone Number: 02/12/2020, 4:04 PM  Clinical Narrative:    Received phone call from palliative care that hospice agency changing to [place] so patient can transition to their hospice home when ready. Palliative care has called the [facility] liaison and information will need to be faxed on Monday am.  CM has called [facility] and cancelled their services.  TOC following.   Expected Discharge Plan: Home w Hospice Care Barriers to Discharge: Continued Medical Work up  Expected Discharge Plan and Services Expected Discharge Plan: [place]   Discharge Planning Services: CM Consult   Living arrangements for the past 2 months: Single Family Home                                       Social Determinants of Health (SDOH) Interventions    Readmission Risk Interventions Readmission Risk Prevention Plan 10/18/2019  Transportation Screening Complete  Social Work Consult for [place] Planning/Counseling [place] Not Applicable  Medication Review [employer]) Complete  Some recent data might be hidden

## 2020-02-12 NOTE — Progress Notes (Signed)
Daily Progress Note   Patient Name: Tony Alvarez       Date: 02/12/2020 DOB: 1955/11/26  Age: 64 y.o. MRN#: [id number] Attending Physician: Aldine Contes, MD Primary Care Physician: [facility] Admit Date: 02/02/2020  Reason for Consultation/Follow-up: Establishing goals of care  Subjective: Patient awake, alert, flat affect. Periods of confusion and impulsivity requiring safety sitter at bedside.    GOC: Extensive f/u [place] meeting with Dr. Haroldine Laws, Dr. Truman Hayward, patient's friend Truman Hayward), and brother [name]) on speaker phone.   Greatly appreciate Dr. Haroldine Laws thoroughly explaining course of hospitalization including diagnoses, interventions, and poor prognosis. Explained LVAD evaluation in detail and unfortunately why Sethan is not a candidate for LVAD placement. Allowed Truman Hayward and [name] to ask questions. Medically recommended shift to comfort measures and hospice support on discharge. Explained fear that Kerby could decline quickly once milrinone is discontinued.   Truman Hayward and [name] appreciate conversation and answers to why he is not an LVAD candidate. They understand diagnoses, prognosis, and recommendation for hospice. [name] states "make him comfortable." Truman Hayward has moved in with [place] and is confident he can care for Dionysus at home with support from hospice. He understands Yutaro needs 24/7 care.   In conclusion, plan to continue milrinone until hospital discharge. Discharge home with hospice services. DNR/DNI. ICD has been deactivated. Will initiate low dose scheduled Roxanol inpatient.   ADDENDUM 1515: F/u with Truman Hayward in family conference room to further discuss plan of care, comfort focused care, hospice philosophy and symptom management medications. Answered many questions and concerns  regarding plan. Patient was previously referred to [facility] this admit. Patient lives in [place]. Truman Hayward prefers [place], understanding if symptoms worsen at home, Garlan can be transferred to [facility] facility. Hopeful for discharge on Tuesday.   Length of Stay: 10  Current Medications: Scheduled Meds:  . ARIPiprazole  10 mg Oral Daily  . aspirin EC  81 mg Oral Daily  . atorvastatin  80 mg Oral q1800  . Chlorhexidine Gluconate Cloth  6 each Topical Daily  . clonazePAM  0.5 mg Oral BID  . clopidogrel  75 mg Oral Daily  . digoxin  0.125 mg Oral Daily  . feeding supplement (GLUCERNA SHAKE)  237 mL Oral TID BM  . folic acid  1 mg Oral Daily  . furosemide  80 mg Oral Daily  . insulin aspart  0-6 Units Subcutaneous TID WC  . levothyroxine  125 mcg Oral QAC breakfast  . multivitamin with minerals  1 tablet Oral Daily  . pantoprazole  40 mg Oral Daily  . rivaroxaban  20 mg Oral Daily  . sodium chloride flush  10-40 mL Intracatheter Q12H  . spironolactone  25 mg Oral Daily    Continuous Infusions: . sodium chloride 250 mL (02/09/20 0839)  . milrinone 0.375 mcg/kg/min (02/12/20 0706)    PRN Meds: sodium chloride, albuterol, sodium chloride flush  Physical Exam Vitals and nursing note reviewed.  Constitutional:      General: He is awake.     Appearance: He is ill-appearing.  Cardiovascular:     Rate and Rhythm: Regular rhythm.  Pulmonary:     Comments: Dyspnea at rest Skin:    General: Skin is warm and dry.  Neurological:     Mental Status: He is alert.     Comments: Delayed responses. Some confusion and impulsivity requiring safety sitter.  Psychiatric:        Mood and Affect: Affect is flat.            Vital Signs: BP 105/65 (BP Location: Left Arm)   Pulse 100   Temp 98.3 F (36.8 C) (Oral)   Resp 14   Ht 5\' 10"  (1.778 m)   Wt 91.2 kg   SpO2 90%   BMI 28.85 kg/m  SpO2: SpO2: 90 % O2 Device: O2 Device: Nasal Cannula O2 Flow Rate:  O2 Flow Rate (L/min): 3 L/min  Intake/output summary:   Intake/Output Summary (Last 24 hours) at 02/12/2020 0956 Last data filed at 02/12/2020 [id number] Gross per 24 hour  Intake 1150 ml  Output 1900 ml  Net -750 ml   LBM: Last BM Date: 02/12/20 Baseline Weight: Weight: 93.4 kg Most recent weight: Weight: 91.2 kg       Palliative Assessment/Data: PPS 40%      Patient Active Problem List   Diagnosis Date Noted  . Goals of care, counseling/discussion   . Palliative care by specialist   . DNR (do not resuscitate)   . Lethargy   . Transaminitis   . Cardiogenic shock ([place])   . Acute encephalopathy   . CHF (congestive heart failure) ([place]) 11/13/2019  . Acute respiratory failure with hypoxia ([place]) 11/12/2019  . CKD (chronic kidney disease) stage 3, GFR 30-59 ml/min 11/12/2019  . Acute exacerbation of CHF (congestive heart failure) ([place]) 10/22/2019  . CHF exacerbation ([place]) 10/17/2019  . Acute and chronic respiratory failure (acute-on-chronic) ([place]) 09/11/2019  . Sleep apnea   . Hypertension   . AKI (acute kidney injury) ([place])   . CAD (coronary artery disease) 06/24/2019  . Hypothyroid   . COPD (chronic obstructive pulmonary disease) ([place])   . Depression   . Hyperlipidemia 01/30/2019  . LV (left ventricular) mural thrombus with acute MI ([place]) 01/30/2019  . Alcohol abuse 01/30/2019  . Alcohol withdrawal delirium ([place]) 01/30/2019  . Elevated liver enzymes 01/30/2019  . Ischemic cardiomyopathy 01/30/2019  . Acute on chronic systolic CHF (congestive heart failure) ([place]) 01/30/2019  . History of colonic polyps 04/21/2018  . Barrett's esophagus 04/21/2018  . Barrett's esophagus without dysplasia 04/21/2018  . Rectal bleeding 04/21/2018    Palliative Care Assessment & Plan   Patient Profile: 64 y.o. male  admitted on 02/02/2020 with significant cardiac history of ischemic cardiomyopathy, artery disease status post anterior STEMI in 3 of 2020, ICD , heart  failure/EF 15 to 20%,  hypertension, hyperlipidemia, depression and substance abuse/EtOH.Marland Kitchen  Assessment: Cardiogenic shock Acute on chronic CHF EF 10% CAD s/p STEMI, PCI 01/2019 Hx of LV thrombus AKI  Recommendations/Plan:  Extensive f/u GOC with Dr. Haroldine Laws, Dr. Truman Hayward, friend/cargiver Truman Hayward) and brother [name]).  DNR/DNI. ICD has been deactivated.  Medically optimize inpatient. Continue milrinone gtt until discharge.  TOC team following. Thomasenia Bottoms is confident he can care for Branford at home and plans to provide 24/7 care. Discharge home with hospice support, hopefully by Tuesday. Cancel [facility] referral. Truman Hayward prefers [facility]. The patient lives in [place] and may eventually need transfer to hospice facility if symptoms worsen at home. Discussed with RN CM, Vida Roller.  Symptom management  Continue scheduled Klonopin  Roxanol 2.5mg  PO q6h scheduled for dyspnea/air hunger  Roxanol 5mg  PO q4h prn breakthrough pain/dyspnea/air hunger/tachypnea   Code Status: DNR   Code Status Orders  (From admission, onward)         Start     Ordered   02/09/20 1327  Do not attempt resuscitation (DNR)  Continuous    Question Answer Comment  In the event of cardiac or respiratory ARREST Do not call a "code blue"   In the event of cardiac or respiratory ARREST Do not perform Intubation, CPR, defibrillation or ACLS   In the event of cardiac or respiratory ARREST Use medication by any route, position, wound care, and other measures to relive pain and suffering. May use oxygen, suction and manual treatment of airway obstruction as needed for comfort.      02/09/20 1326        Code Status History    Date Active Date Inactive Code Status Order ID Comments User Context   02/02/2020 1744 02/09/2020 1326 Full Code [id number]  Marty Heck, DO ED   12/12/2019 1136 12/12/2019 2223 Full Code [id number]  Deboraha Sprang, MD Inpatient   11/12/2019 2246 11/14/2019 1908 Full Code [id number]  Truett Mainland, DO Inpatient    10/22/2019 0251 10/23/2019 1720 Full Code [id number]  Oswald Hillock, MD Inpatient   10/17/2019 2153 10/21/2019 1934 Full Code [id number]  Lynetta Mare, MD ED   09/11/2019 1802 09/13/2019 1928 Full Code [id number]  Murlean Iba, MD ED   06/25/2019 0158 06/25/2019 1711 Full Code [id number]  Vianne Bulls, MD Inpatient   01/26/2019 2326 01/30/2019 2138 Full Code [id number]  Troy Sine, MD Inpatient   01/26/2019 2319 01/26/2019 2326 Full Code [id number]  Doylene Canning, MD Inpatient   Advance Care Planning Activity       Prognosis:   Poor prognosis possibly weeks if not days once milrinone gtt discontinued. End-stage heart failure with cardiogenic shock  Discharge Planning:  Home with Hospice  Care plan was discussed with patient, Truman Hayward (friend), [name] (brother), RN, Dr. Haroldine Laws, Dr. Truman Hayward, RN CM  Thank you for allowing the Palliative Medicine Team to assist in the care of this patient.   Time In: 1100- 1515- Time Out: 1140 1600 Total Time 85 Prolonged Time Billed yes      Greater than 50%  of this time was spent counseling and coordinating care related to the above assessment and plan.  Ihor Dow, DNP, FNP-C Palliative Medicine Team  Phone: [phone] Fax: [phone]  Please contact Palliative Medicine Team phone at [phone] for questions and concerns.

## 2020-02-12 NOTE — Progress Notes (Signed)
3E15C [facility] ([facility]). RN note  Spoke with PMT Jinny Blossom) who provided an update on patient's discharge plans. States PMT,resident and Dr. Haroldine Laws all spoke with the patients friend Truman Hayward and brother [name] confirming patient will be discharged home with hospice. States patient's ICD has been deactivated and the Milrinone will continue until hospital discharge.   Plan: Discharge pending for Tuesday.  Please call with any hospice related questions.  Vicente Serene, BSN [place] (in [place]) [phone]

## 2020-02-13 DIAGNOSIS — N179 Acute kidney failure, unspecified: Secondary | ICD-10-CM

## 2020-02-13 LAB — COOXEMETRY PANEL
Carboxyhemoglobin: 1.3 % (ref 0.5–1.5)
Methemoglobin: 1.1 % (ref 0.0–1.5)
O2 Saturation: 53.3 %
Total hemoglobin: 11.9 g/dL — ABNORMAL LOW (ref 12.0–16.0)

## 2020-02-13 LAB — RENAL FUNCTION PANEL
Albumin: 3.2 g/dL — ABNORMAL LOW (ref 3.5–5.0)
Albumin: 3.5 g/dL (ref 3.5–5.0)
Anion gap: 8 (ref 5–15)
Anion gap: 11 (ref 5–15)
BUN: 16 mg/dL (ref 8–23)
BUN: 20 mg/dL (ref 8–23)
CO2: 28 mmol/L (ref 22–32)
CO2: 29 mmol/L (ref 22–32)
Calcium: 9.5 mg/dL (ref 8.9–10.3)
Calcium: 9.8 mg/dL (ref 8.9–10.3)
Chloride: 98 mmol/L (ref 98–111)
Chloride: 98 mmol/L (ref 98–111)
Creatinine, Ser: 1.08 mg/dL (ref 0.61–1.24)
Creatinine, Ser: 1.01 mg/dL (ref 0.61–1.24)
GFR calc Af Amer: >60 mL/min (ref >60)
GFR calc Af Amer: >60 mL/min (ref >60)
GFR calc non Af Amer: >60 mL/min (ref >60)
GFR calc non Af Amer: >60 mL/min (ref >60)
Glucose, Bld: 190 mg/dL — ABNORMAL HIGH (ref 70–99)
Glucose, Bld: 137 mg/dL — ABNORMAL HIGH (ref 70–99)
Phosphorus: 3.9 mg/dL (ref 2.5–4.6)
Phosphorus: 4.3 mg/dL (ref 2.5–4.6)
Potassium: 3.8 mmol/L (ref 3.5–5.1)
Potassium: 4.3 mmol/L (ref 3.5–5.1)
Sodium: 134 mmol/L — ABNORMAL LOW (ref 135–145)
Sodium: 138 mmol/L (ref 135–145)

## 2020-02-13 LAB — GLUCOSE, CAPILLARY
Glucose-Capillary: 105 mg/dL — ABNORMAL HIGH (ref 70–99)
Glucose-Capillary: 144 mg/dL — ABNORMAL HIGH (ref 70–99)
Glucose-Capillary: 120 mg/dL — ABNORMAL HIGH (ref 70–99)
Glucose-Capillary: 138 mg/dL — ABNORMAL HIGH (ref 70–99)
Glucose-Capillary: 131 mg/dL — ABNORMAL HIGH (ref 70–99)

## 2020-02-13 LAB — CBC
HCT: 37.8 % — ABNORMAL LOW (ref 39.0–52.0)
Hemoglobin: 11.8 g/dL — ABNORMAL LOW (ref 13.0–17.0)
MCH: 27.6 pg (ref 26.0–34.0)
MCHC: 31.2 g/dL (ref 30.0–36.0)
MCV: 88.3 fL (ref 80.0–100.0)
Platelets: 276 10*3/uL (ref 150–400)
RBC: 4.28 MIL/uL (ref 4.22–5.81)
RDW: 18.5 % — ABNORMAL HIGH (ref 11.5–15.5)
WBC: 9.1 10*3/uL (ref 4.0–10.5)
nRBC: 0 % (ref 0.0–0.2)

## 2020-02-13 LAB — MAGNESIUM: Magnesium: 2.1 mg/dL (ref 1.7–2.4)

## 2020-02-13 MED ORDER — POTASSIUM CHLORIDE CRYS ER 20 MEQ PO TBCR
40.0000 meq | EXTENDED_RELEASE_TABLET | Freq: Once | ORAL | Status: AC
  Administered 2020-02-13: 13:00:00 40 meq via ORAL
  Filled 2020-02-13: qty 2

## 2020-02-13 NOTE — Progress Notes (Signed)
Sitter at bedside.

## 2020-02-13 NOTE — Progress Notes (Signed)
OT Cancellation Note  Patient Details Name: Matthue Rufty MRN: [id number] DOB: 1956/09/04   Cancelled Treatment:    Reason Eval/Treat Not Completed: Other (comment)(RN requesting to hold OT until later as pt resting.)   OT to follow for continued OT intervention.  Jefferey Pica, OTR/L Acute Rehabilitation Services Pager: [phone] Office: [place] 02/13/2020, 12:15 PM

## 2020-02-13 NOTE — Progress Notes (Signed)
Palliative Medicine RN Note: Rec'd a call from [facility]). She requested an update on Tony Alvarez's palliative plan (likely home tomorrow with hospice), and she wanted to let us know that he does have a psychiatrist & a therapist outpatient who provide care/prescribe his psych meds. Janett Billow does not have a record of who his psychiatrist, but Tony Erk may be able to answer the question himself.  Marjie Skiff Loralie Malta, RN, BSN, [facility] Palliative Medicine Team 02/13/2020 10:48 AM Office [phone]

## 2020-02-13 NOTE — Progress Notes (Signed)
Occupational Therapy Treatment Patient Details Name: Tony Alvarez MRN: [id number] DOB: 09-11-1956 Today's Date: 02/13/2020    History of present illness Pt is a 64 y.o. male admitted 02/02/20 with AMS after being found by neighbor, apparently hyperventilating and confused. CT head and c-spine negative for acute abnormality. CXR with stable cardiomegaly, central pulmonary congestion, and mild bibasilar atelectasis vs edema. Workup for cardiogenic shock with acute on chronic HFrEF with diastolic dysfunction. Unclear etiology for AMS. PMH includes CAD, CHF, HTN, OSA, depression.   OT comments  Pt progressing to OOB ADL for UB/LB ADL with minA overall. Pt combing hair/washing hair with shower cap with modA due to fatigue. Pt standing at sink x10 mins prior to seated rest break. Pt ambulating with minguardA and no AD in room. O2 levels >90% O2 on 1.5L. Pt following 2 step commands. Energy conservation techniques reviewed- very little carry over recalled. Pt would benefit from continued OT skilled services. OT following acutely.    Follow Up Recommendations  Home health OT;Supervision/Assistance - 24 hour    Equipment Recommendations  3 in 1 bedside commode    Recommendations for Other Services      Precautions / Restrictions Precautions Precautions: Fall Precaution Comments: watch SpO2 Restrictions Weight Bearing Restrictions: No       Mobility Bed Mobility               General bed mobility comments: in recliner  Transfers Overall transfer level: Needs assistance Equipment used: None Transfers: Sit to/from Stand Sit to Stand: Min guard         General transfer comment: min guard for safety    Balance Overall balance assessment: Needs assistance Sitting-balance support: No upper extremity supported;Feet supported Sitting balance-Leahy Scale: Good     Standing balance support: Bilateral upper extremity supported;During functional activity Standing balance-Leahy  Scale: Fair Standing balance comment: relies on UE support                           ADL either performed or assessed with clinical judgement   ADL Overall ADL's : Needs assistance/impaired     Grooming: Supervision/safety;Standing;Set up;Sitting Grooming Details (indicate cue type and reason): Stood for majority of task; rest break for fatigue and to focus on hair combing     Lower Body Bathing: Minimal assistance;Sitting/lateral leans       Lower Body Dressing: Minimal assistance;Sitting/lateral leans               Functional mobility during ADLs: Min guard General ADL Comments: Pt tolerating session fair with activity tolerance and mobility. Pt appearing to have flat affect, but follows commands. Pt kepy O2 on without any dificulty today.     Vision   Vision Assessment?: No apparent visual deficits Additional Comments: wears glassses   Perception     Praxis      Cognition Arousal/Alertness: Awake/alert Behavior During Therapy: Flat affect Overall Cognitive Status: Impaired/Different from baseline Area of Impairment: Following commands                               General Comments: Pt following all simple 2 step commands. Pt answering questions with 1 word answers and no follow-up or details. Pt not always answering OT's questions.        Exercises     Shoulder Instructions       General Comments O2 on 1.5 L O2 >98% throughout  session    Pertinent Vitals/ Pain       Pain Assessment: No/denies pain  Home Living                                          Prior Functioning/Environment              Frequency  Min 2X/week        Progress Toward Goals  OT Goals(current goals can now be found in the care plan section)  Progress towards OT goals: Progressing toward goals  Acute Rehab OT Goals Patient Stated Goal: Would like to return home OT Goal Formulation: With patient Time For Goal Achievement:  02/24/20 Potential to Achieve Goals: Good ADL Goals Pt Will Perform Lower Body Bathing: with modified independence;sit to/from stand;sitting/lateral leans Pt Will Perform Lower Body Dressing: with modified independence;sitting/lateral leans;sit to/from stand Pt Will Transfer to Toilet: with modified independence;ambulating;regular height toilet Pt Will Perform Tub/Shower Transfer: with modified independence;shower seat;ambulating Additional ADL Goal #1: Pt will state 3 fall prevention strategies to improve safety awareness in BADL for d/c home  Plan Discharge plan remains appropriate    Co-evaluation                 AM-PAC OT "6 Clicks" Daily Activity     Outcome Measure   Help from another person eating meals?: None Help from another person taking care of personal grooming?: None Help from another person toileting, which includes using toliet, bedpan, or urinal?: A Little Help from another person bathing (including washing, rinsing, drying)?: A Little Help from another person to put on and taking off regular upper body clothing?: None Help from another person to put on and taking off regular lower body clothing?: A Little 6 Click Score: 21    End of Session Equipment Utilized During Treatment: Gait belt  OT Visit Diagnosis: Unsteadiness on feet (R26.81);Other symptoms and signs involving cognitive function   Activity Tolerance Patient tolerated treatment well   Patient Left Other (comment)(up with cardiac rehab for a walk)   Nurse Communication Mobility status        Time: [id number] OT Time Calculation (min): 30 min  Charges: OT General Charges $OT Visit: 1 Visit OT Treatments $Self Care/Home Management : 23-37 mins  Jefferey Pica, OTR/L Acute Rehabilitation Services Pager: [phone] Office: [phone]   Vietta Bonifield C 02/13/2020, 3:54 PM

## 2020-02-13 NOTE — TOC Progression Note (Addendum)
Transition of Care [facility]) - Progression Note    Patient Details  Name: Zayed Bigman MRN: [id number] Date of Birth: Mar 31, 1956  Transition of [facility]) CM/SW Contact  Zenon Mayo, RN Phone Number: 02/13/2020, 3:45 PM  Clinical Narrative:    NCM received consult for home hospice with [facility], [place] contacted [place] with [facility], made referral for home hospice with plan for dc tomorrow 4/6.  She will contact [name] to get DME ordered that's needed.  Truman Hayward states he will be transporting patient home tomorrow. [name] will be staying with patient at discharge per palliative notes.    Expected Discharge Plan: Home w Hospice Care Barriers to Discharge: Continued Medical Work up  Expected Discharge Plan and Services Expected Discharge Plan: [place] In-house Referral: NA Discharge Planning Services: CM Consult Post Acute Care Choice: Hospice Living arrangements for the past 2 months: Single Family Home                 DME Arranged: (DME will be arranged by [facility])         [place] Arranged: RN [facility] Agency: [facility] Date [place]: 02/13/20 Time [place]: [place] Representative spoke with at [place]: [place] ([place]) Interventions    Readmission Risk Interventions Readmission Risk Prevention Plan 10/18/2019  Transportation Screening Complete  Social Work Consult for [place] Planning/Counseling [place] Not Applicable  Medication Review Press photographer) Complete  Some recent data might be hidden

## 2020-02-13 NOTE — Progress Notes (Addendum)
Advanced Heart Failure Rounding Note  PCP-Cardiologist: Shelva Majestic, MD   Subjective:    Echo EF 10% RV mild to moderately down   On milrinone 0.375 mcg. CO-OX 53%.  CVP 3   Yesterday Dr Haroldine Laws, Palliative Care, and IMTS met with his family friend and his brother by phone. Discussed GOC and plans to go home with Hospice.    Remains confused. Denies SOB.    Objective:   Weight Range: 91.2 kg Body mass index is 28.84 kg/m.   Vital Signs:   Temp:  [97.6 F (36.4 C)-98.6 F (37 C)] 97.6 F (36.4 C) (04/05 0800) Pulse Rate:  [98-108] 102 (04/05 0800) Resp:  [0-25] 18 (04/05 0800) BP: (96-118)/(61-77) 107/76 (04/05 0800) SpO2:  [93 %-100 %] 97 % (04/05 0800) Weight:  [91.2 kg] 91.2 kg (04/05 0634) Last BM Date: 02/12/20  Weight change: Filed Weights   02/09/20 0044 02/10/20 0022 02/13/20 0634  Weight: 93.6 kg 91.2 kg 91.2 kg    Intake/Output:   Intake/Output Summary (Last 24 hours) at 02/13/2020 0835 Last data filed at 02/12/2020 2300 Gross per 24 hour  Intake 977 ml  Output 1950 ml  Net -973 ml      Physical Exam  CVP 3  General:  Sitting on the side of the bed.  No resp difficulty Confused HEENT: normal anicteric  Neck: supple. no JVD. Carotids 2+ bilat; no bruits. No lymphadenopathy or thryomegaly appreciated. Cor: PMI nondisplaced. Tachy Regular rate & rhythm. No rubs, or murmurs. +S3  Lungs: clear no wheeze  Abdomen: soft, nontender, nondistended. No hepatosplenomegaly. No bruits or masses. Good bowel sounds. Extremities: no cyanosis, clubbing, rash, edema. RUE PICC  Neuro: alert & orientedx self/year unsure of city, cranial nerves grossly intact. moves all 4 extremities w/o difficulty. Affect pleasant   Telemetry   Sinus Tach 100-110s   Labs    CBC Recent Labs    02/12/20 0423 02/13/20 0500  WBC 8.7 9.1  HGB 10.9* 11.8*  HCT 35.4* 37.8*  MCV 88.1 88.3  PLT 269 115   Basic Metabolic Panel Recent Labs    02/11/20 0500  02/11/20 0500 02/12/20 0423 02/13/20 0500  NA 135  --  135  --   K 4.1  --  3.8  --   CL 99  --  98  --   CO2 27  --  27  --   GLUCOSE 183*  --  166*  --   BUN 14  --  13  --   CREATININE 0.96  --  0.90  --   CALCIUM 9.6  --  9.5  --   MG 2.0   < > 1.9 2.1   < > = values in this interval not displayed.   Liver Function Tests No results for input(s): AST, ALT, ALKPHOS, BILITOT, PROT, ALBUMIN in the last 72 hours. No results for input(s): LIPASE, AMYLASE in the last 72 hours. Cardiac Enzymes No results for input(s): CKTOTAL, CKMB, CKMBINDEX, TROPONINI in the last 72 hours.  BNP: BNP (last 3 results) Recent Labs    12/02/19 1146 12/14/19 1048 02/02/20 1343  BNP 656.0* 438.6* 2,899.9*    ProBNP (last 3 results) Recent Labs    07/26/19 1102  PROBNP 992*     D-Dimer No results for input(s): DDIMER in the last 72 hours. Hemoglobin A1C No results for input(s): HGBA1C in the last 72 hours. Fasting Lipid Panel No results for input(s): CHOL, HDL, LDLCALC, TRIG, CHOLHDL, LDLDIRECT in the last  72 hours. Thyroid Function Tests No results for input(s): TSH, T4TOTAL, T3FREE, THYROIDAB in the last 72 hours.  Invalid input(s): FREET3  Other results:   Imaging    No results found.   Medications:     Scheduled Medications: . ARIPiprazole  10 mg Oral Daily  . aspirin EC  81 mg Oral Daily  . Chlorhexidine Gluconate Cloth  6 each Topical Daily  . clonazePAM  0.5 mg Oral BID  . clopidogrel  75 mg Oral Daily  . digoxin  0.125 mg Oral Daily  . feeding supplement (GLUCERNA SHAKE)  237 mL Oral TID BM  . folic acid  1 mg Oral Daily  . furosemide  80 mg Oral Daily  . insulin aspart  0-6 Units Subcutaneous TID WC  . levothyroxine  125 mcg Oral QAC breakfast  . morphine CONCENTRATE  2.6 mg Oral Q6H  . multivitamin with minerals  1 tablet Oral Daily  . pantoprazole  40 mg Oral Daily  . sodium chloride flush  10-40 mL Intracatheter Q12H  . spironolactone  25 mg Oral Daily     Infusions: . sodium chloride 250 mL (02/09/20 0839)  . milrinone 0.375 mcg/kg/min (02/13/20 0142)    PRN Medications: sodium chloride, albuterol, morphine CONCENTRATE, sodium chloride flush     Assessment/Plan   1. Cardiogenic Shock  -Has ICD - now deactivated. Progressing to comfort care.  - ECHO this admit EF 10% with RV mild moderately reduce. In 2020 EF 25-30%  - Lactic Acid 2.9 on admit -CO-OX 53% on milrinone  0.375 mcg. Plan to stop at discharge and remove PICC.  -CVP 3. Volume status stable. Continue lasix 80 mg po daily.   - Hold beta blocker due to low output state.  - Continue digoxin 0.125 - Continue spiro 25 mg daily.  - BP too low for ARB/ARNI - Renal function stable.  - Shock improved with milrinone. He has failed milrinone wean several times. After long discussions with VAD team and family it has been decided that he is not a candidate for mechanical support due to multiple reasons including ongoing confusion, longstanding proven inability to care for himself and lack of stable support system. This has been discussed with his family in detail and documented well in the chart. See note from 02/12/20. -He has been referred to Hospice. ICD has been de-activated.  -Continue milrinone until discharged to Hospice for comfort care.   2. CAD s/p anterior STEMI, PCI 01/2019 - No current CP - Continue ASA + statin.    3. History of LV Thrombus - On Xarelto as outpatient. Med rec did not show xarelto so he has not been taking for some time.   - Would not restart with high risk for fall.   4. AKI  - In the setting of low output HF.   - Creatinine peaked at 1.5 - Creatinine stable today.   5. Hypothyroidism  - On levothyroxine.  6. AMS - CT of head was negative.  - UDS negative.  - Has h/o major depressive disorder.   7. Hypokalemia -K stable.   8. Hypomagnesemia -MAg 2.1    Home with Hospice when arrangements in place.  D/C Meds  Lasix 80 mg po  daily Spiro 25 mg daily Digoxin 0.125 mg daily  We will set HF follow up 4-6 weeks. Family can cancel if needed.   Length of Stay: [place], NP  02/13/2020, 8:35 AM  Advanced Heart Failure Team Pager [phone] (M-F; 7a -  4p)  Please contact [facility] for night-coverage after hours (4p -7a ) and weekends on [email or web address]  Patient seen and examined with the above-signed Advanced Practice Provider and/or Housestaff. I personally reviewed laboratory data, imaging studies and relevant notes. I independently examined the patient and formulated the important aspects of the plan. I have edited the note to reflect any of my changes or salient points. I have personally discussed the plan with the patient and/or family.  Agree with above. He remains with low output despite milrinone support. Confused.  Agree with plan for d/c to home with Home Hospice tomorrow. HF meds as above.   Appreciate IMTS care and Palliative Care teams help.   Glori Bickers, MD  4:48 PM

## 2020-02-13 NOTE — Progress Notes (Signed)
  Subjective:  Patient seen at bedside. Patient denies shortness of breath. Patient counseled on plan of care, all questions answered.  Objective:   Vital Signs (last 24 hours): Vitals:   02/12/20 1320 02/12/20 1830 02/12/20 2030 02/12/20 2247  BP: 100/67 107/61 118/77   Pulse: 100 98 (!) 108 100  Resp: 18 (!) 25 (!) 22 18  Temp:  97.9 F (36.6 C) 98.3 F (36.8 C)   TempSrc:  Oral Oral   SpO2: 95% 98% 100%   Weight:      Height:        Physical Exam: General Alert and responds to question, no acute distress  Cardiac Regular rate and rhythm, no murmurs, rubs, or gallops  Pulmonary Clear to auscultation bilaterally without wheezes, rhonchi, or rales  Extremities No peripheral edema    CBC Latest Ref Rng & Units 02/13/2020 02/12/2020 02/11/2020  WBC 4.0 - 10.5 K/uL 9.1 8.7 8.4  Hemoglobin 13.0 - 17.0 g/dL 11.8(L) 10.9(L) 10.4(L)  Hematocrit 39.0 - 52.0 % 37.8(L) 35.4(L) 33.8(L)  Platelets 150 - 400 K/uL 276 269 249   BMP Latest Ref Rng & Units 02/12/2020 02/11/2020 02/10/2020  Glucose 70 - 99 mg/dL 166(H) 183(H) 99  BUN 8 - 23 mg/dL 13 14 11   Creatinine 0.61 - 1.24 mg/dL 0.90 0.96 1.08  BUN/Creat Ratio 10 - 24 - - -  Sodium 135 - 145 mmol/L 135 135 136  Potassium 3.5 - 5.1 mmol/L 3.8 4.1 3.9  Chloride 98 - 111 mmol/L 98 99 99  CO2 22 - 32 mmol/L 27 27 26   Calcium 8.9 - 10.3 mg/dL 9.5 9.6 9.8   Net Urine Output: 1243 ml  Assessment/Plan:   Principal Problem:   Cardiogenic shock (HCC) Active Problems:   Acute exacerbation of CHF (congestive heart failure) (HCC)   Acute encephalopathy   Transaminitis   Palliative care by specialist   DNR (do not resuscitate)   Lethargy   Goals of care, counseling/discussion  Patient is a 64 year old male with past medical history significant for ischemic cardiomyopathy, STEMI status post PCI, HFrEF, OSA, hypertension, hyperlipidemia, and depression who was admitted on 02/02/2020 for low output heart failure.  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  Palliative on board. Family meeting yesterday, plan to discharge patient to home hospice with understanding patient may need residential hospice. Plan for discharge on Tuesday 02/14/20 *Continue with digoxin 0.125 mg daily, spironolactone 25 mg daily, furosemide 80 mg daily *I/O's, daily weights *Milrinone 0.375 mcg/kg/min  # Major depressive disorder: Patient with waxing waning mental status *Continue with aripiprazole 10 mg daily + clonazepam 1 mg daily  # LV Thrombus On anticoagulation, no evidence of bleeding *Continue with Xarelto  # Normocytic anemia Hgbstable at 11.8 today. B12 1077, MMA pending. Ferritin 49, Iron 25, TIBC 318, Sat 8 - Trend CBC  # CAD s/p STEMI, pCI 01/2019 w LAD stent: - C/w clopidogrel  PT/OT: HHPT and HHOT, DME 3 N 1 (ordered) Diet: Regular DVT Ppx: Xarelto Admit Status: Inpatient Dispo: Anticipated discharge tomorrow to home hospice  Jeanmarie Hubert, MD 02/13/2020, 6:38 AM

## 2020-02-13 NOTE — Progress Notes (Signed)
Patient ID: Tony Alvarez, male   DOB: 1956/02/05, 64 y.o.   MRN: [id number]  This NP visited patient at the bedside as a follow up for palliative medicine needs and emotional support..  Continued conversation with patient and family through the weekend regarding current medical situation; diagnosis, prognosis, treatment options, advance care planning and anticipatory care.  Patient and family understand the seriousness of the patient's current medical condition, and limited treatment options to prolong quality of life.  Plan is for patient to transition care to focus on comfort, quality and dignity at this time.  Plan of care -DNR/DNI -Focus of care is comfort and dignity -Home with hospice, family is requesting [facility] -Symptom management, as patient transitions at end of life.  Educated family on utilization of medications for symptoms specific to dyspnea and pain -Avoid rehospitalization -Anticipate discharge home in the morning  Conversation with friend/caregiver [name] regarding the natural trajectory and expectations at end of life.  Emotional support offered.  Discussed with patient/caregiver the importance of continued conversation with each other and family and their  medical providers regarding overall plan of care and treatment options,  ensuring decisions are within the context of the patients values and GOCs.  Questions and concerns addressed   Discussed with Dr Dareen Piano and [facility]  Total time spent on the unit was 35 minutes  Greater than 50% of the time was spent in counseling and coordination of care  Wadie Lessen NP  Palliative Medicine Team Team Phone # [phone] Pager [phone]

## 2020-02-13 NOTE — Progress Notes (Signed)
CARDIAC REHAB PHASE I   PRE:  Rate/Rhythm: 109 ST    BP: sitting 100/74    SaO2: 99 4L  MODE:  Ambulation: 150 ft   POST:  Rate/Rhythm: 114 ST    BP: sitting 111/84     SaO2: 93 2L, 100 2L  Pt at sink, just had finished clean up with OT.  Willing to walk. Able to stand and slowly shuffle hall, no assist needed (x2 for standby assist and equipment). Looked at fish and discussed ice cream. To recliner after walk. Stated some fatigue, VSS. On chair alarm, talking with sister on phone. Left on 2L.  [place], ACSM 02/13/2020 2:02 PM

## 2020-02-13 NOTE — Progress Notes (Signed)
Pt in bedside chair. Chair alarm is on. Sitter unavailable at this time. RN and NT frequently rounding on pt.

## 2020-02-13 NOTE — Progress Notes (Signed)
Another renal panel sent to lab await results.

## 2020-02-13 NOTE — Plan of Care (Signed)
  Problem: Education: Goal: Ability to demonstrate management of disease process will improve Outcome: Progressing Goal: Ability to verbalize understanding of medication therapies will improve Outcome: Progressing Goal: Individualized Educational Video(s) Outcome: Progressing   Problem: Cardiac: Goal: Ability to achieve and maintain adequate cardiopulmonary perfusion will improve Outcome: Progressing   Problem: Education: Goal: Knowledge of General Education information will improve Description: Including pain rating scale, medication(s)/side effects and non-pharmacologic comfort measures Outcome: Progressing   Problem: Health Behavior/Discharge Planning: Goal: Ability to manage health-related needs will improve Outcome: Progressing   Problem: Clinical Measurements: Goal: Ability to maintain clinical measurements within normal limits will improve Outcome: Progressing Goal: Will remain free from infection Outcome: Progressing Goal: Diagnostic test results will improve Outcome: Progressing Goal: Respiratory complications will improve Outcome: Progressing Goal: Cardiovascular complication will be avoided Outcome: Progressing   Problem: Activity: Goal: Risk for activity intolerance will decrease Outcome: Progressing   Problem: Coping: Goal: Level of anxiety will decrease Outcome: Progressing   Problem: Elimination: Goal: Will not experience complications related to bowel motility Outcome: Progressing Goal: Will not experience complications related to urinary retention Outcome: Progressing   Problem: Pain Managment: Goal: General experience of comfort will improve Outcome: Progressing   Problem: Safety: Goal: Ability to remain free from injury will improve Outcome: Progressing   Problem: Skin Integrity: Goal: Risk for impaired skin integrity will decrease Outcome: Progressing

## 2020-02-14 ENCOUNTER — Other Ambulatory Visit: Payer: Self-pay | Admitting: Internal Medicine

## 2020-02-14 DIAGNOSIS — I2102 ST elevation (STEMI) myocardial infarction involving left anterior descending coronary artery: Secondary | ICD-10-CM

## 2020-02-14 DIAGNOSIS — I255 Ischemic cardiomyopathy: Secondary | ICD-10-CM

## 2020-02-14 DIAGNOSIS — Z79899 Other long term (current) drug therapy: Secondary | ICD-10-CM

## 2020-02-14 DIAGNOSIS — I2510 Atherosclerotic heart disease of native coronary artery without angina pectoris: Secondary | ICD-10-CM

## 2020-02-14 LAB — METHYLMALONIC ACID(MMA), RND URINE
Creatinine(Crt), U: 0.72 g/L (ref 0.30–3.00)
MMA - Normalized: 1.3 umol/mmol cr (ref 0.3–2.8)
Methylmalonic Acid, Ur: 8 umol/L (ref 1.6–29.7)

## 2020-02-14 LAB — COOXEMETRY PANEL
Carboxyhemoglobin: 1.5 % (ref 0.5–1.5)
Methemoglobin: 1.2 % (ref 0.0–1.5)
O2 Saturation: 54.4 %
Total hemoglobin: 11.8 g/dL — ABNORMAL LOW (ref 12.0–16.0)

## 2020-02-14 LAB — GLUCOSE, CAPILLARY
Glucose-Capillary: 44 mg/dL — CL (ref 70–99)
Glucose-Capillary: 105 mg/dL — ABNORMAL HIGH (ref 70–99)
Glucose-Capillary: 108 mg/dL — ABNORMAL HIGH (ref 70–99)

## 2020-02-14 LAB — CBC
HCT: 37.2 % — ABNORMAL LOW (ref 39.0–52.0)
Hemoglobin: 11.6 g/dL — ABNORMAL LOW (ref 13.0–17.0)
MCH: 27.8 pg (ref 26.0–34.0)
MCHC: 31.2 g/dL (ref 30.0–36.0)
MCV: 89.2 fL (ref 80.0–100.0)
Platelets: 280 10*3/uL (ref 150–400)
RBC: 4.17 MIL/uL — ABNORMAL LOW (ref 4.22–5.81)
RDW: 18.7 % — ABNORMAL HIGH (ref 11.5–15.5)
WBC: 8.6 10*3/uL (ref 4.0–10.5)
nRBC: 0 % (ref 0.0–0.2)

## 2020-02-14 LAB — RENAL FUNCTION PANEL
Albumin: 3.2 g/dL — ABNORMAL LOW (ref 3.5–5.0)
Anion gap: 10 (ref 5–15)
BUN: 23 mg/dL (ref 8–23)
CO2: 28 mmol/L (ref 22–32)
Calcium: 9.5 mg/dL (ref 8.9–10.3)
Chloride: 99 mmol/L (ref 98–111)
Creatinine, Ser: 0.92 mg/dL (ref 0.61–1.24)
GFR calc Af Amer: >60 mL/min (ref >60)
GFR calc non Af Amer: >60 mL/min (ref >60)
Glucose, Bld: 129 mg/dL — ABNORMAL HIGH (ref 70–99)
Phosphorus: 3.8 mg/dL (ref 2.5–4.6)
Potassium: 3.8 mmol/L (ref 3.5–5.1)
Sodium: 137 mmol/L (ref 135–145)

## 2020-02-14 LAB — MAGNESIUM: Magnesium: 2 mg/dL (ref 1.7–2.4)

## 2020-02-14 MED ORDER — SPIRONOLACTONE 25 MG PO TABS: 25.0000 mg | ORAL_TABLET | Freq: Every day | ORAL | 0 refills | Status: DC

## 2020-02-14 MED ORDER — POTASSIUM CHLORIDE CRYS ER 20 MEQ PO TBCR
40.0000 meq | EXTENDED_RELEASE_TABLET | Freq: Once | ORAL | Status: AC
  Administered 2020-02-14: 09:00:00 40 meq via ORAL
  Filled 2020-02-14: qty 2

## 2020-02-14 MED ORDER — SPIRONOLACTONE 25 MG PO TABS: 25.0000 mg | ORAL_TABLET | Freq: Every day | ORAL | 0 refills | Status: AC

## 2020-02-14 MED ORDER — DIGOXIN 125 MCG PO TABS: 0.1250 mg | ORAL_TABLET | Freq: Every day | ORAL | 0 refills | Status: DC

## 2020-02-14 MED ORDER — MORPHINE SULFATE (CONCENTRATE) 10 MG/0.5ML PO SOLN: 5.0000 mg | ORAL | 0 refills | Status: DC | PRN

## 2020-02-14 MED ORDER — FUROSEMIDE 80 MG PO TABS: 80.0000 mg | ORAL_TABLET | Freq: Every day | ORAL | 0 refills | Status: DC

## 2020-02-14 MED ORDER — DIGOXIN 125 MCG PO TABS: 0.1250 mg | ORAL_TABLET | Freq: Every day | ORAL | 0 refills | Status: AC

## 2020-02-14 MED ORDER — MORPHINE SULFATE (CONCENTRATE) 10 MG/0.5ML PO SOLN: 2.5000 mg | Freq: Four times a day (QID) | ORAL | 0 refills | Status: AC

## 2020-02-14 MED ORDER — MORPHINE SULFATE (CONCENTRATE) 10 MG/0.5ML PO SOLN: 2.5000 mg | Freq: Four times a day (QID) | ORAL | 0 refills | Status: DC

## 2020-02-14 MED ORDER — FUROSEMIDE 80 MG PO TABS: 80.0000 mg | ORAL_TABLET | Freq: Every day | ORAL | 0 refills | Status: AC

## 2020-02-14 MED ORDER — MORPHINE SULFATE (CONCENTRATE) 10 MG/0.5ML PO SOLN: 5.0000 mg | ORAL | 0 refills | Status: AC | PRN

## 2020-02-14 MED FILL — DIGOXIN 0.125 MG TABLET: 125 | 30 days supply | Qty: 30 | Fill #0

## 2020-02-14 MED FILL — SPIRONOLACTONE 25 MG TABLET: 25 | 30 days supply | Qty: 30 | Fill #0

## 2020-02-14 NOTE — TOC Transition Note (Signed)
Transition of Care [facility]) - CM/SW Discharge Note   Patient Details  Name: Tony Alvarez MRN: [id number] Date of Birth: October 17, 1956  [facility]) CM/SW Contact:  Zenon Mayo, RN Phone Number: 02/14/2020, 12:48 PM   Clinical Narrative:    Patient for dc today, home with [facility] , patient 's brother notified and Truman Hayward notified.  Truman Hayward will be transporting patient home today.  [facility] has delivered the hospital bed, the egg crate mattress and the bedside table.    Final next level of care: Home w Hospice Care Barriers to Discharge: No Barriers Identified   Patient Goals and CMS Choice Patient states their goals for this hospitalization and ongoing recovery are:: home with hospice CMS Medicare.gov Compare Post Acute Care list provided to:: Patient Represenative (must comment) Choice offered to / list presented to : Adult Children(Friend)  Discharge Placement                       Discharge Plan and Services In-house Referral: NA Discharge Planning Services: CM Consult Post Acute Care Choice: Hospice          DME Arranged: (DME supplied by [facility])         HH Arranged: RN [facility] Agency: [place] Date [place]: 02/13/20 Time [place]: [place] Representative spoke with at [place]: [place] Determinants of Health ([place]) Interventions     Readmission Risk Interventions Readmission Risk Prevention Plan 10/18/2019  Transportation Screening Complete  Social Work Consult for [place] Planning/Counseling [place] Not Applicable  Medication Review Press photographer) Complete  Some recent data might be hidden

## 2020-02-14 NOTE — Plan of Care (Signed)
Home with Hospice and roomate

## 2020-02-14 NOTE — Progress Notes (Signed)
IV milrinone discontinued. IV team nurse at bedside to remove PICC line. Patients friend, [name], contacted and notified of discharge orders and medications available at [facility] as well as medications delivered at bedside by [facility]. Truman Hayward states that the hospital bed is being delivered at this time and then he will be able to assist the patient home.

## 2020-02-14 NOTE — Progress Notes (Signed)
Patient discharge paper work given with [name] at Bedside Side tele and PICC out

## 2020-02-14 NOTE — Discharge Instructions (Addendum)
You were seen in the hospital for heart failure. Your symptoms have improved and we are sending you home with home hospice services. We have made changes to your medications to better control your symptoms and also to reduce the number of medications that you have to take. Please see your medication list for complete details. If your symptoms worsen, please call the home hospice service. They can be reached at [phone]. Thank you for allowing [place] to be part of your medical care!

## 2020-02-14 NOTE — Progress Notes (Signed)
Per hospice request, medications were sent to [facility]

## 2020-02-14 NOTE — Progress Notes (Signed)
  Subjective:  Tony Alvarez was examined and evaluated at bedside this AM. Discussed plan to discharge home today. Tony Alvarez expressed understanding. No acute complaints at this time.  Objective:    Vital Signs (last 24 hours): Vitals:   02/13/20 2300 02/14/20 0014 02/14/20 0119 02/14/20 0442  BP:  101/65  109/79  Pulse:  (!) 102  (!) 106  Resp: 12 18  15   Temp:  98 F (36.7 C)  (!) 97.4 F (36.3 C)  TempSrc:  Oral  Oral  SpO2:  96%  92%  Weight:   92.6 kg   Height:       Physical Exam: General Alert and answers questions appropriately, no acute distress  Cardiac Regular rate and rhythm, no murmurs, rubs, or gallops  Pulmonary Clear to auscultation bilaterally without wheezes, rhonchi, or rales    CBC Latest Ref Rng & Units 02/14/2020 02/13/2020 02/12/2020  WBC 4.0 - 10.5 K/uL 8.6 9.1 8.7  Hemoglobin 13.0 - 17.0 g/dL 11.6(L) 11.8(L) 10.9(L)  Hematocrit 39.0 - 52.0 % 37.2(L) 37.8(L) 35.4(L)  Platelets 150 - 400 K/uL 280 276 269   BMP Latest Ref Rng & Units 02/14/2020 02/13/2020 02/13/2020  Glucose 70 - 99 mg/dL 129(H) 137(H) 190(H)  BUN 8 - 23 mg/dL 23 20 16   Creatinine 0.61 - 1.24 mg/dL 0.92 1.01 1.08  BUN/Creat Ratio 10 - 24 - - -  Sodium 135 - 145 mmol/L 137 138 134(L)  Potassium 3.5 - 5.1 mmol/L 3.8 4.3 3.8  Chloride 98 - 111 mmol/L 99 98 98  CO2 22 - 32 mmol/L 28 29 28   Calcium 8.9 - 10.3 mg/dL 9.5 9.8 9.5   Venous O2 Saturation: 54.4   Assessment/Plan:   Principal Problem:   Cardiogenic shock (HCC) Active Problems:   Acute exacerbation of CHF (congestive heart failure) (HCC)   Acute encephalopathy   Transaminitis   Palliative care by specialist   DNR (do not resuscitate)   Lethargy   Goals of care, counseling/discussion  Patient is a 64 year old male with past medical history significant for ischemic cardiomyopathy, STEMI status post PCI, HFrEF, OSA, hypertension, hyperlipidemia, and depression who was admitted on 02/02/2020 for low output heart failure.  # Acute on  chronic combined systolic diastolic heart failure EF 15-20%, inotrope dependent, not LVAD candidate.  Palliative on board. Plan to discharge patient to home hospice with understanding patient may need residential hospice in the near future. Plan for discharge today *Continue with digoxin 0.125 mg daily, spironolactone 25 mg daily, furosemide 80 mg daily *Stop milrinone today prior to discharge  # Major depressive disorder: Patient with waxing and waning mental status *Continue aripiprazole 10 mg daily + clonazepam 1 mg daily  # LV Thrombus: Anticoagulation (Xarelto) stopped 2/2 goals of care  # Normocytic anemia Hemoglobin stable at 11.6 today, B12 1077, MMA pending. Ferritin 49, Iron 25, TIBC 318, Sat 8  # CAD s/p STEMI, PCI 01/2019 with LAD stent Continue with plavix  PT/OT: HHPT and HHOT, DME 3 N 1 (ordered) Diet: Regular DVT Ppx: None 2/2 goals of care Admit Status: Inpatient Dispo: Anticipated discharge today  Tony Hubert, MD 02/14/2020, 6:14 AM

## 2020-02-14 NOTE — Progress Notes (Signed)
Nutrition Brief Note  Chart reviewed. Pt now transitioning to comfort care. Plan to discharge home with hospice services with possible plan to transition to residential hospice. ICD was deactivated on 02/10/20.  No further nutrition interventions warranted at this time.  Please re-consult as needed.   Loistine Chance, RD, LDN, [place] Registered Dietitian II Certified Diabetes Care and Education Specialist Please refer to [facility] for RD and/or RD on-call/weekend/after hours pager

## 2020-02-14 NOTE — Progress Notes (Signed)
Report received. Pt sitting up in chair with sitter at bedside. No apparent distress noted.

## 2020-02-14 NOTE — Progress Notes (Signed)
Paged MD to [place] for clarification of morphine orders.

## 2020-02-14 NOTE — Progress Notes (Signed)
Advanced Heart Failure Rounding Note  PCP-Cardiologist: Shelva Majestic, MD   Subjective:    Echo EF 10% RV mild to moderately down   On milrinone 0.375 mcg. CO-OX 54%. CVP low, Remains tachycardic this am.   More alert. Eager to go home.   Objective:   Weight Range: 92.6 kg Body mass index is 29.3 kg/m.   Vital Signs:   Temp:  [97.4 F (36.3 C)-98.3 F (36.8 C)] 97.6 F (36.4 C) (04/06 0753) Pulse Rate:  [102-106] 106 (04/06 0753) Resp:  [12-24] 15 (04/06 0753) BP: (89-111)/(65-79) 110/79 (04/06 0753) SpO2:  [92 %-100 %] 99 % (04/06 0753) Weight:  [92.6 kg] 92.6 kg (04/06 0119) Last BM Date: 02/12/20  Weight change: Filed Weights   02/10/20 0022 02/13/20 0634 02/14/20 0119  Weight: 91.2 kg 91.2 kg 92.6 kg    Intake/Output:   Intake/Output Summary (Last 24 hours) at 02/14/2020 0850 Last data filed at 02/14/2020 0400 Gross per 24 hour  Intake 1624.8 ml  Output 1225 ml  Net 399.8 ml      Physical Exam    General:  Weakappearing. No resp difficulty HEENT: normal Neck: supple. no JVD. Carotids 2+ bilat; no bruits. No lymphadenopathy or thryomegaly appreciated. Cor: PMI nondisplaced. Regular tachy +s3 No rubs, gallops or murmurs. Lungs: clear Abdomen: soft, nontender, nondistended. No hepatosplenomegaly. No bruits or masses. Good bowel sounds. Extremities: no cyanosis, clubbing, rash, edema Neuro: more alert still confused at times   Telemetry   Sinus Tach 100-110. Personally reviewed   Labs    CBC Recent Labs    02/13/20 0500 02/14/20 0413  WBC 9.1 8.6  HGB 11.8* 11.6*  HCT 37.8* 37.2*  MCV 88.3 89.2  PLT [id number]   Basic Metabolic Panel Recent Labs    02/13/20 0500 02/13/20 0517 02/13/20 1610 02/14/20 0413  NA  --    < > 138 137  K  --    < > 4.3 3.8  CL  --    < > 98 99  CO2  --    < > 29 28  GLUCOSE  --    < > 137* 129*  BUN  --    < > 20 23  CREATININE  --    < > 1.01 0.92  CALCIUM  --    < > 9.8 9.5  MG 2.1  --   --  2.0   PHOS  --    < > 4.3 3.8   < > = values in this interval not displayed.   Liver Function Tests Recent Labs    02/13/20 1610 02/14/20 0413  ALBUMIN 3.5 3.2*   No results for input(s): LIPASE, AMYLASE in the last 72 hours. Cardiac Enzymes No results for input(s): CKTOTAL, CKMB, CKMBINDEX, TROPONINI in the last 72 hours.  BNP: BNP (last 3 results) Recent Labs    12/02/19 1146 12/14/19 1048 02/02/20 1343  BNP 656.0* 438.6* 2,899.9*    ProBNP (last 3 results) Recent Labs    07/26/19 1102  PROBNP 992*     D-Dimer No results for input(s): DDIMER in the last 72 hours. Hemoglobin A1C No results for input(s): HGBA1C in the last 72 hours. Fasting Lipid Panel No results for input(s): CHOL, HDL, LDLCALC, TRIG, CHOLHDL, LDLDIRECT in the last 72 hours. Thyroid Function Tests No results for input(s): TSH, T4TOTAL, T3FREE, THYROIDAB in the last 72 hours.  Invalid input(s): FREET3  Other results:   Imaging    No results found.  Medications:     Scheduled Medications: . ARIPiprazole  10 mg Oral Daily  . aspirin EC  81 mg Oral Daily  . Chlorhexidine Gluconate Cloth  6 each Topical Daily  . clonazePAM  0.5 mg Oral BID  . clopidogrel  75 mg Oral Daily  . digoxin  0.125 mg Oral Daily  . feeding supplement (GLUCERNA SHAKE)  237 mL Oral TID BM  . folic acid  1 mg Oral Daily  . furosemide  80 mg Oral Daily  . insulin aspart  0-6 Units Subcutaneous TID WC  . levothyroxine  125 mcg Oral QAC breakfast  . morphine CONCENTRATE  2.6 mg Oral Q6H  . multivitamin with minerals  1 tablet Oral Daily  . pantoprazole  40 mg Oral Daily  . potassium chloride  40 mEq Oral Once  . sodium chloride flush  10-40 mL Intracatheter Q12H  . spironolactone  25 mg Oral Daily    Infusions: . sodium chloride 250 mL (02/09/20 0839)  . milrinone 0.375 mcg/kg/min (02/14/20 0553)    PRN Medications: sodium chloride, albuterol, morphine CONCENTRATE, sodium chloride flush      Assessment/Plan   1. Cardiogenic Shock  -Has ICD - now deactivated. Progressing to comfort care.  - ECHO this admit EF 10% with RV mild moderately reduce. In 2020 EF 25-30%  - Lactic Acid 2.9 on admit -CO-OX 54% on milrinone  0.375 mcg. Plan to stop at discharge and remove PICC.  - Volume status looks good. Continue lasix 80 mg po daily.   - Hold beta blocker due to low output state.  - Continue digoxin 0.125 - Continue spiro 25 mg daily.  - BP too low for ARB/ARNI - Renal function stable.  - Shock improved with milrinone. He has failed milrinone wean several times. After long discussions with VAD team and family it has been decided that he is not a candidate for mechanical support due to multiple reasons including ongoing confusion, longstanding proven inability to care for himself and lack of stable support system. This has been discussed with his family in detail and documented well in the chart. See note from 02/12/20. -He has been referred to Hospice. ICD has been de-activated.  -Continue milrinone until discharged to Hospice for comfort care.  - Home today with Hospice and comfort care  2. CAD s/p anterior STEMI, PCI 01/2019 - No current CP - Continue ASA + statin.    3. History of LV Thrombus - On Xarelto as outpatient. Med rec did not show xarelto so he has not been taking for some time.   - Would not restart with high risk for fall.   4. AKI  - In the setting of low output HF.   - Creatinine peaked at 1.5 - Resolved. Creatinine 0.9 today  5. AMS - CT of head was negative.  - UDS negative.  - Has h/o major depressive disorder.  - Improved but still confused at time   Home with Hospice when arrangements in place.  D/C Meds  Lasix 80 mg po daily Spiro 25 mg daily Digoxin 0.125 mg daily  Home today with Hospice. We will set HF follow up 4-6 weeks. Family can cancel if needed.   Length of Stay: 12  Glori Bickers, MD  02/14/2020, 8:50 AM  Advanced Heart Failure  Team Pager [phone] (M-F; 7a - 4p)  Please contact [facility] for night-coverage after hours (4p -7a ) and weekends on [email or web address]

## 2020-02-14 NOTE — Discharge Summary (Signed)
Name: Tony Alvarez MRN: [id number] DOB: 12/13/1955 64 y.o. PCP: [facility]  Date of Admission: 02/02/2020  1:31 PM Date of Discharge: 02/14/2020 Attending Physician: No att. providers found  Discharge Diagnosis: 1. Acute on chronic combined systolic and diastolic heart failure 2. Acute kidney injury 3. Altered mental status   Discharge Medications: Allergies as of 02/14/2020   No Known Allergies     Medication List    STOP taking these medications   aspirin 81 MG chewable tablet   atorvastatin 80 MG tablet Commonly known as: LIPITOR   buPROPion 150 MG 12 hr tablet Commonly known as: WELLBUTRIN SR   carvedilol 6.25 MG tablet Commonly known as: COREG   clopidogrel 75 MG tablet Commonly known as: PLAVIX   Eszopiclone 3 MG Tabs   pantoprazole 40 MG tablet Commonly known as: Protonix   potassium chloride SA 20 MEQ tablet Commonly known as: KLOR-CON     TAKE these medications   acetaminophen 325 MG tablet Commonly known as: TYLENOL Take 2 tablets (650 mg total) by mouth every 6 (six) hours as needed for mild pain, fever or headache (or Fever >/= 101).   albuterol 108 (90 Base) MCG/ACT inhaler Commonly known as: VENTOLIN HFA Inhale 2 puffs into the lungs every 6 (six) hours as needed for wheezing or shortness of breath.   ARIPiprazole 15 MG tablet Commonly known as: ABILIFY Take 15 mg by mouth at bedtime.   clonazePAM 1 MG tablet Commonly known as: KLONOPIN Take 1-2 mg by mouth 2 (two) times daily.   digoxin 0.125 MG tablet Commonly known as: LANOXIN Take 1 tablet (0.125 mg total) by mouth daily. What changed:   how much to take  how to take this  when to take this  additional instructions   folic acid 1 MG tablet Commonly known as: FOLVITE Take 1 tablet (1 mg total) by mouth daily.   furosemide 80 MG tablet Commonly known as: LASIX Take 1 tablet (80 mg total) by mouth daily. What changed:   medication strength  how  much to take   levothyroxine 125 MCG tablet Commonly known as: SYNTHROID Take 125 mcg by mouth daily before breakfast.   morphine CONCENTRATE 10 MG/0.5ML Soln concentrated solution Take 0.13 mLs (2.6 mg total) by mouth every 6 (six) hours.   morphine CONCENTRATE 10 MG/0.5ML Soln concentrated solution Take 0.25 mLs (5 mg total) by mouth every 4 (four) hours as needed for moderate pain or shortness of breath (dyspnea/air hunger).   multivitamin with minerals Tabs tablet Take 1 tablet by mouth every morning.   nitroGLYCERIN 0.4 MG SL tablet Commonly known as: NITROSTAT Place 1 tablet (0.4 mg total) under the tongue every 5 (five) minutes x 3 doses as needed for chest pain.   QUEtiapine 100 MG tablet Commonly known as: SEROQUEL Take 50 mg by mouth at bedtime.   spironolactone 25 MG tablet Commonly known as: ALDACTONE Take 1 tablet (25 mg total) by mouth daily. What changed:   how much to take  when to take this            Durable Medical Equipment  (From admission, onward)         Start     Ordered   02/10/20 1351  For home use only DME 3 n 1  Once     02/10/20 1350          Disposition and follow-up:   Tony Alvarez was discharged from [facility]  in fair condition but with expectation for relatively rapid decompensation. At the hospital follow up visit please address:  1.  Please assess patient's air hunger and adjust morphine dosage as appropriate. Please assess patient's volume status and adjust lasix dosage as needed.  2.  Labs / imaging needed at time of follow-up: None  3.  Pending labs/ test needing follow-up: None  Follow-up Appointments: [place] Follow up.   Why: home hospice Contact information: [address] [place] [address] [phone]           Hospital Course by problem list: Patient is a 64 year old male with past medical history significant for ischemic  cardiomyopathy, STEMI status post PCI in March 2020, HFrEF, hypertension, hyperlipidemia, OSA, and depression who presented with altered mental status, lower extremity edema, and elevated BNP on 02/02/2020.  # Cardiogenic shock # Acute on chronic HFrEF+Diastolic Dsyfunction Patient presented with bilateral pitting lower extremity edema to the knees.  BNP elevated 2899.  Troponins flat at 36 and 42.  Patient had ICD placed on 12/12/2019. Repeat TTE on 02/03/20 revealed TTE on 15-20% and grade 3 diastolic dysfunction (restrictive).  This EF was decreased from 25-30% in October 2020. Outpatient regimen of carvedilol 6.25 mg twice daily, spironolactone 12.5 mg twice daily, Lasix 40 mg daily, and digoxin 0.0625 mg daily - unclear compliance with these medications.   Cardiology was consulted. Patient started on milrinone 0.25 mcg/kg/min evening 3/25 due to hypotension/low output state. Venous O2 sat of 49.6% on 3/26 and milrinone increased to 0.375 mcg/kg/min with improvement in venous O2 saturation. Patient initially placed on lasix drip before transitioning to oral regimen. Patient diuresed well on regimen of 80 mg PO daily and was discharged on 80 mg daily. Patient had significant improvement in volume status, resolution of lower extremity edema. Cumulative net output of ~2400 ml during hospitalization and body mass of 92.6 kg on day of discharge. Patient with poor prognosis and palliative care was consulted. Patient elected to be DNR/DNI, deactivate defibrillator, and go home with home-hospice services.  # Acute kidney injury Creatinine of 1.76 on admission, baseline levels of 1.0-1.3.  Urinalysis with small hemoglobin, 100 protein, 6-10 RBCs, 0-5 WBCs.  Renal ultrasound significant for increased echogenicity likely secondary to medical renal disease.  Suspect that worsening renal function is secondary to vascular congestion versus low output state.  # Altered mental status # Major depressive disorder Patient  with waxing and waning mental status. Psychiatry evaluated patient, they recommended to continue aripiprazole 10 mg daily + clonazepam 1 mg daily which inpatient with plans for outpatient taper and titration to an SSRI. Etiology may be low output heart failure although there was only mild improvement with increased milrinone dosage.  # History of LV thrombus Patient was on xarelto as outpatient for history of LV mural thrombus. This was temporarily transitioned to heparin while inpatient while VAD procedure was being considered. Xarelto was discontinued at discharge due to patient's treatment goals with hospice.   # Elevated AST/ALT AST and ALT elevated to 311 and 611, with high T bili at 3.8 and normal alk phos 98. INR elevated to 1.8, although this may be secondary to Xarelto if patient was compliant. RUQ ultrasound was without acute hepatobiliary disease. LFTs improved during hospitalization with AST/ALT of 32/111 on 02/08/20. Suspect abnormalities secondary to poor perfusion from lower output heart failure. Viral workup for hepatitis B and C was negative.  Discharge Vitals:   BP 92/67 (BP Location:  Left Arm)   Pulse (!) 104   Temp 98 F (36.7 C) (Oral)   Resp 18   Ht _0  (1.778 m)   Wt 92.6 kg   SpO2 97%   BMI 29.30 kg/m   Pertinent Labs, Studies, and Procedures:  CBC Latest Ref Rng & Units 02/14/2020 02/13/2020 02/12/2020  WBC 4.0 - 10.5 K/uL 8.6 9.1 8.7  Hemoglobin 13.0 - 17.0 g/dL 11.6(L) 11.8(L) 10.9(L)  Hematocrit 39.0 - 52.0 % 37.2(L) 37.8(L) 35.4(L)  Platelets 150 - 400 K/uL 280 276 269   BMP Latest Ref Rng & Units 02/14/2020 02/13/2020 02/13/2020  Glucose 70 - 99 mg/dL 129(H) 137(H) 190(H)  BUN 8 - 23 mg/dL _1 Creatinine 0.61 - 1.24 mg/dL 0.92 1.01 1.08  BUN/Creat Ratio 10 - 24 - - -  Sodium 135 - 145 mmol/L 137 138 134(L)  Potassium 3.5 - 5.1 mmol/L 3.8 4.3 3.8  Chloride 98 - 111 mmol/L 99 98 98  CO2 22 - 32 mmol/L _2 Calcium 8.9 - 10.3 mg/dL 9.5 9.8 9.5    Magnesium (02/14/20): 2.0  Venous Oxygen Saturation (02/14/20): 54.4%  TTE (02/03/20): IMPRESSIONS  1. No LV thrombus on contrast imaging. Extensive akinesis of mid septum,  all apical segmemts, and apex consistent with prior LAD  infarction/aneurysm. Left ventricular ejection fraction, by estimation, is  15-20%. The left ventricle has severely decreased  function. The left ventricle demonstrates regional wall motion  abnormalities (see scoring diagram/findings for description). The left  ventricular internal cavity size was mildly dilated. Left ventricular  diastolic parameters are consistent with Grade  III diastolic dysfunction (restrictive). Elevated left atrial pressure.  2. Right ventricular systolic function is moderately reduced. The right  ventricular size is moderately enlarged. There is moderately elevated  pulmonary artery systolic pressure. The estimated right ventricular  systolic pressure is 09.4 mmHg.  3. Left atrial size was severely dilated.  4. Right atrial size was severely dilated.  5. The mitral valve is grossly normal. Mild to moderate mitral valve  regurgitation. No evidence of mitral stenosis.  6. Tricuspid valve regurgitation is mild to moderate.  7. The aortic valve is tricuspid. Aortic valve regurgitation is not  visualized. No aortic stenosis is present.  8. The inferior vena cava is dilated in size with <50% respiratory  variability, suggesting right atrial pressure of 15 mmHg.   US Renal (02/02/20): IMPRESSION: 1. Left renal cyst. 2. Increased renal echogenicity which may be secondary to medical renal disease.  US Abdomen Limited RUQ (02/02/20): IMPRESSION: No acute hepatobiliary disease.  CT Head WO Contrast (02/02/20): CT Cervical Spine WO Contrast (02/02/20): IMPRESSION: CT of the head: No acute intracranial abnormality noted.  CT of the cervical spine: Multilevel degenerative change without acute abnormality.  CXR  (02/02/20): IMPRESSION: Stable cardiomegaly with central pulmonary vascular congestion. Mild bibasilar atelectasis or edema is noted.  Discharge Instructions: Discharge Instructions    Call MD for:  difficulty breathing, headache or visual disturbances   Complete by: As directed    Call MD for:  persistant dizziness or light-headedness   Complete by: As directed    Call MD for:  severe uncontrolled pain   Complete by: As directed    Diet - low sodium heart healthy   Complete by: As directed    Increase activity slowly   Complete by: As directed      Signed: Jeanmarie Hubert, MD 02/14/2020, 2:34 PM

## 2020-02-17 ENCOUNTER — Other Ambulatory Visit (HOSPITAL_COMMUNITY): Payer: BC Managed Care – PPO

## 2020-03-10 DEATH — deceased

## 2020-03-13 ENCOUNTER — Encounter: Payer: BC Managed Care – PPO | Admitting: Internal Medicine

## 2020-03-29 ENCOUNTER — Encounter (HOSPITAL_COMMUNITY): Payer: BC Managed Care – PPO | Admitting: Internal Medicine

## 2020-04-06 ENCOUNTER — Ambulatory Visit: Payer: BC Managed Care – PPO | Admitting: Cardiovascular Disease

## 2020-04-27 IMAGING — DX DG CHEST 1V PORT
1 series · 1 of 1 positions shown · non-contrast
Comparison: Prior today, 06/24/2019, and 05/17/2019

CLINICAL DATA: Shortness of breath beginning this morning.

EXAM:
PORTABLE CHEST 1 VIEW

[chest ap]
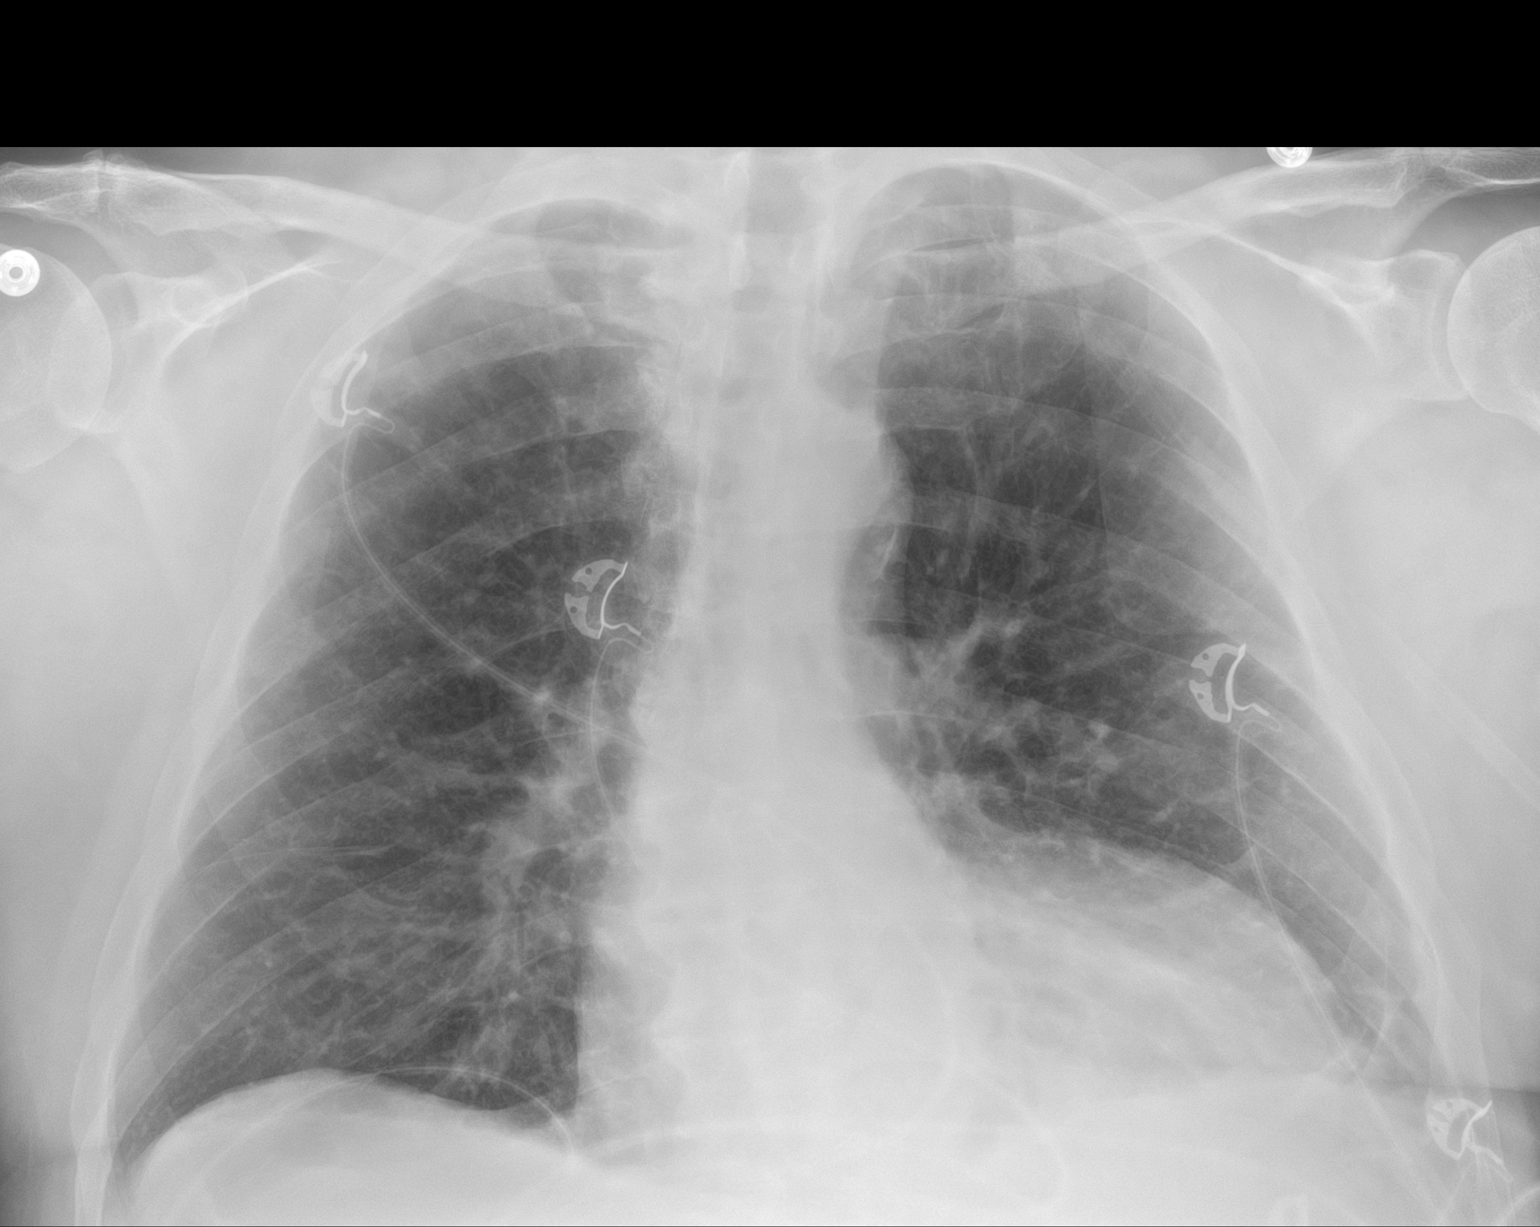

[1 of 1 positions shown; findings below may reference images not displayed]

FINDINGS: Mild cardiomegaly remains stable. Stable pulmonary interstitial
prominence. Linear opacity in the left retrocardiac lung base is
stable and consistent with scarring. No evidence of acute infiltrate
or edema. No pleural effusion seen.
IMPRESSION: Stable mild cardiomegaly and left basilar scarring. No acute
findings.

## 2020-04-27 IMAGING — DX DG CHEST 1V PORT
2 series · 2 of 2 positions shown · non-contrast
Comparison: Chest x-rays dated 06/24/2019 and 05/17/2019.

CLINICAL DATA: Shortness of breath.

EXAM:
PORTABLE CHEST 1 VIEW

[chest ap (1 of 2)]
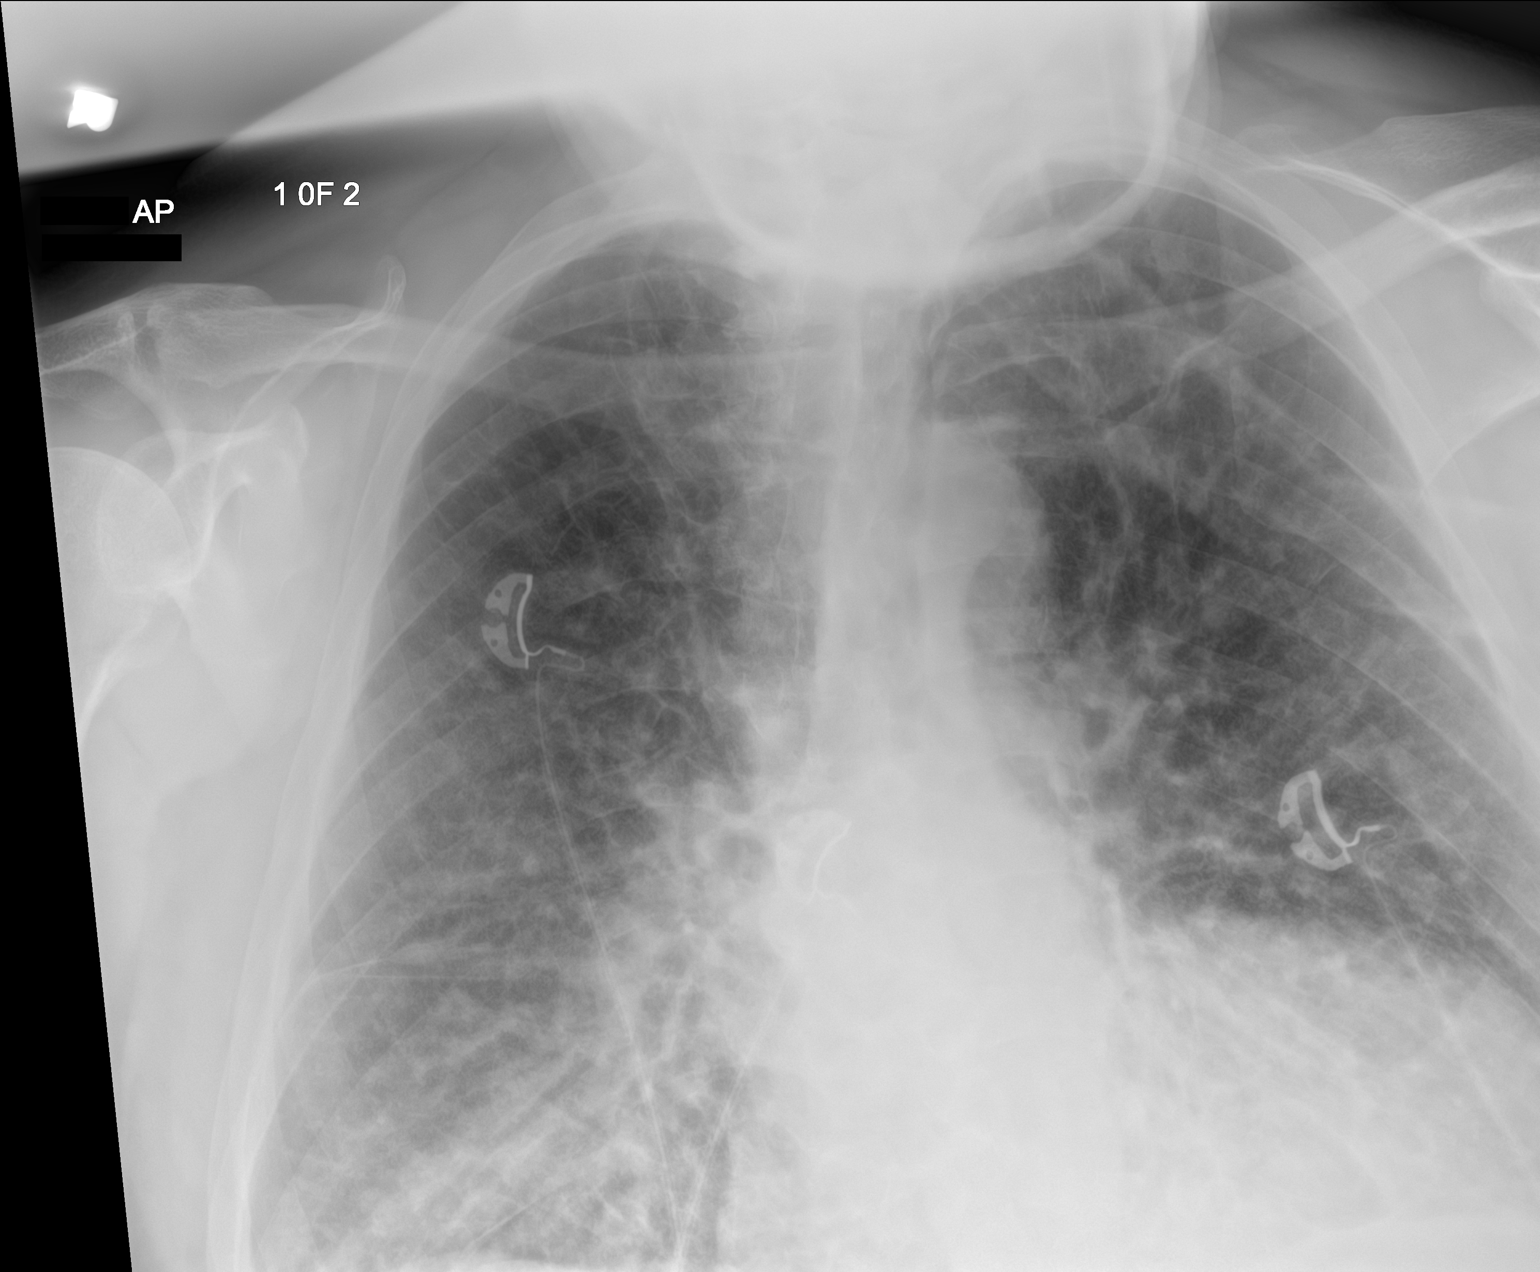

[chest ap (2 of 2)]
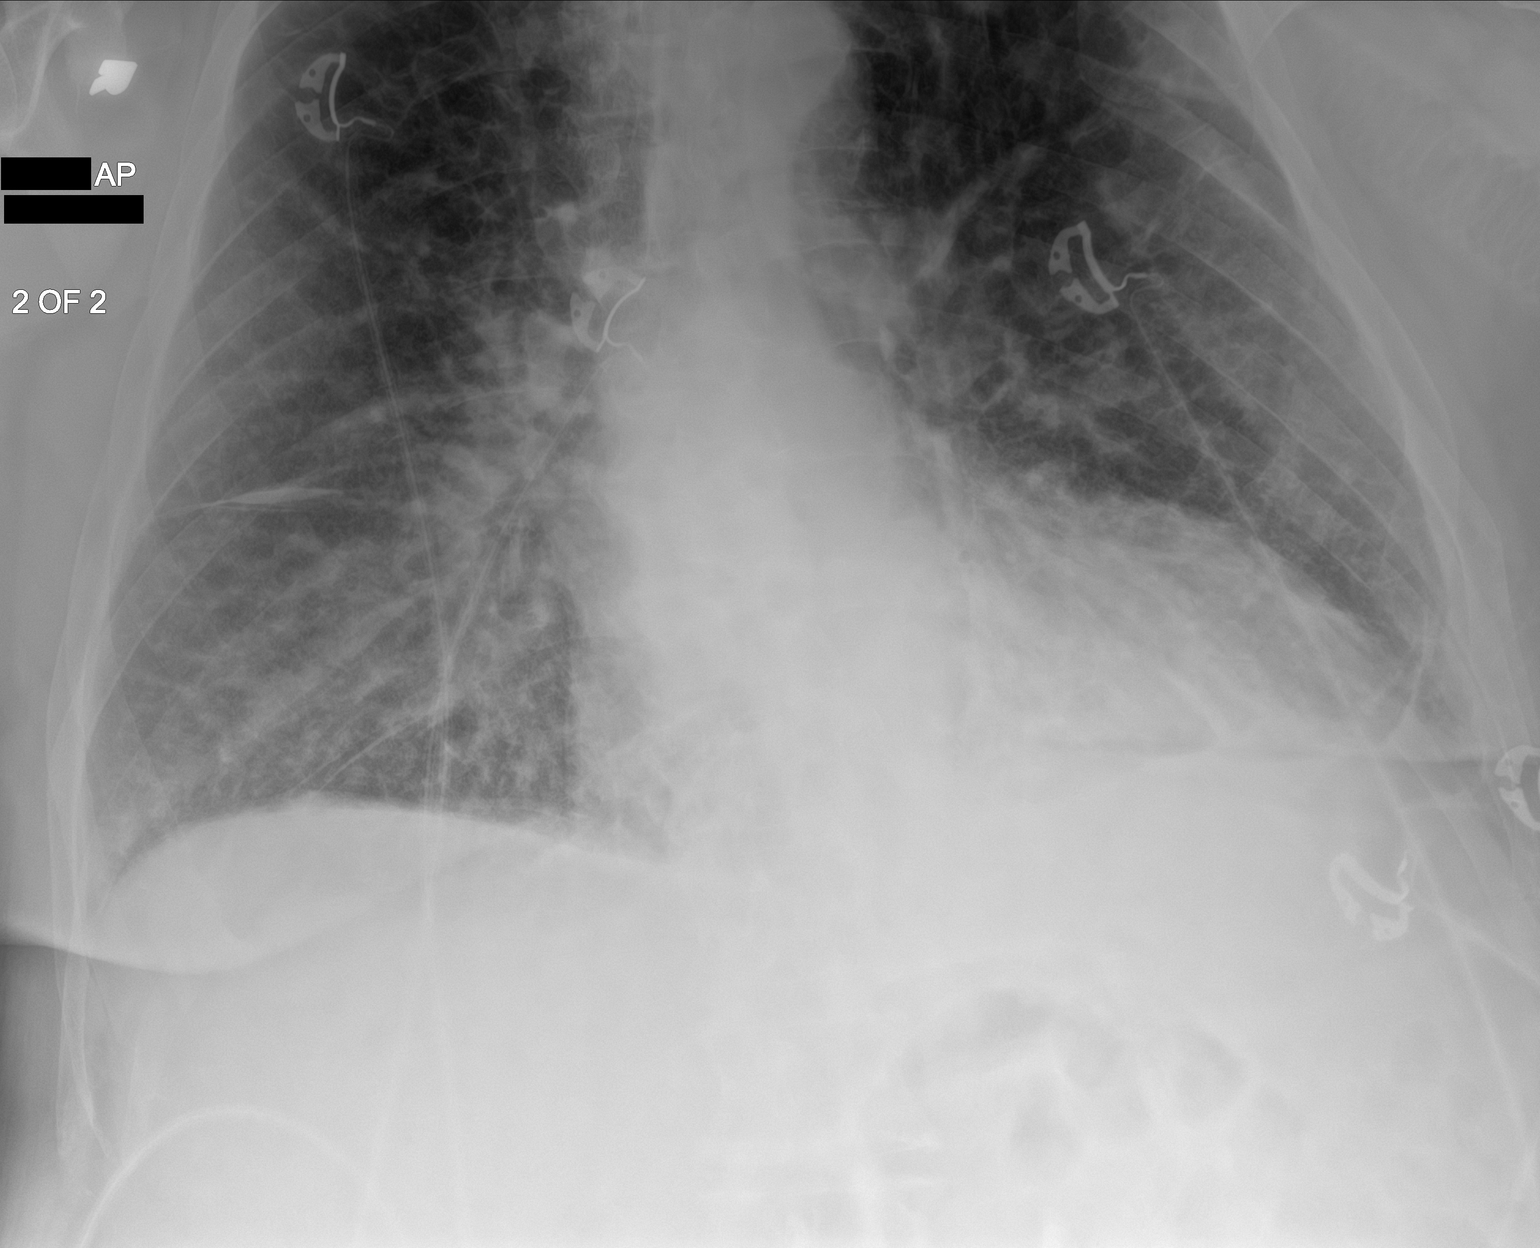

[2 of 2 positions shown; findings below may reference images not displayed]

FINDINGS: New interstitial opacities within the mid and lower lung zones
bilaterally. Stable mild cardiomegaly. Coarse lung markings at the
lung apices suggest chronic interstitial lung disease and/or
emphysematous change.
IMPRESSION: 1. New interstitial opacities within the mid and lower lung zones
bilaterally, compatible with either CHF/volume overload or atypical
pneumonia, likely superimposed on chronic interstitial lung
disease/emphysema.
2. Stable mild cardiomegaly.

## 2020-06-30 IMAGING — DX DG CHEST 1V PORT
2 series · 2 of 2 positions shown · non-contrast
Comparison: 11/12/2019

CLINICAL DATA: Shortness of breath and chest tightness

EXAM:
PORTABLE CHEST 1 VIEW

[chest ap (1 of 2)]
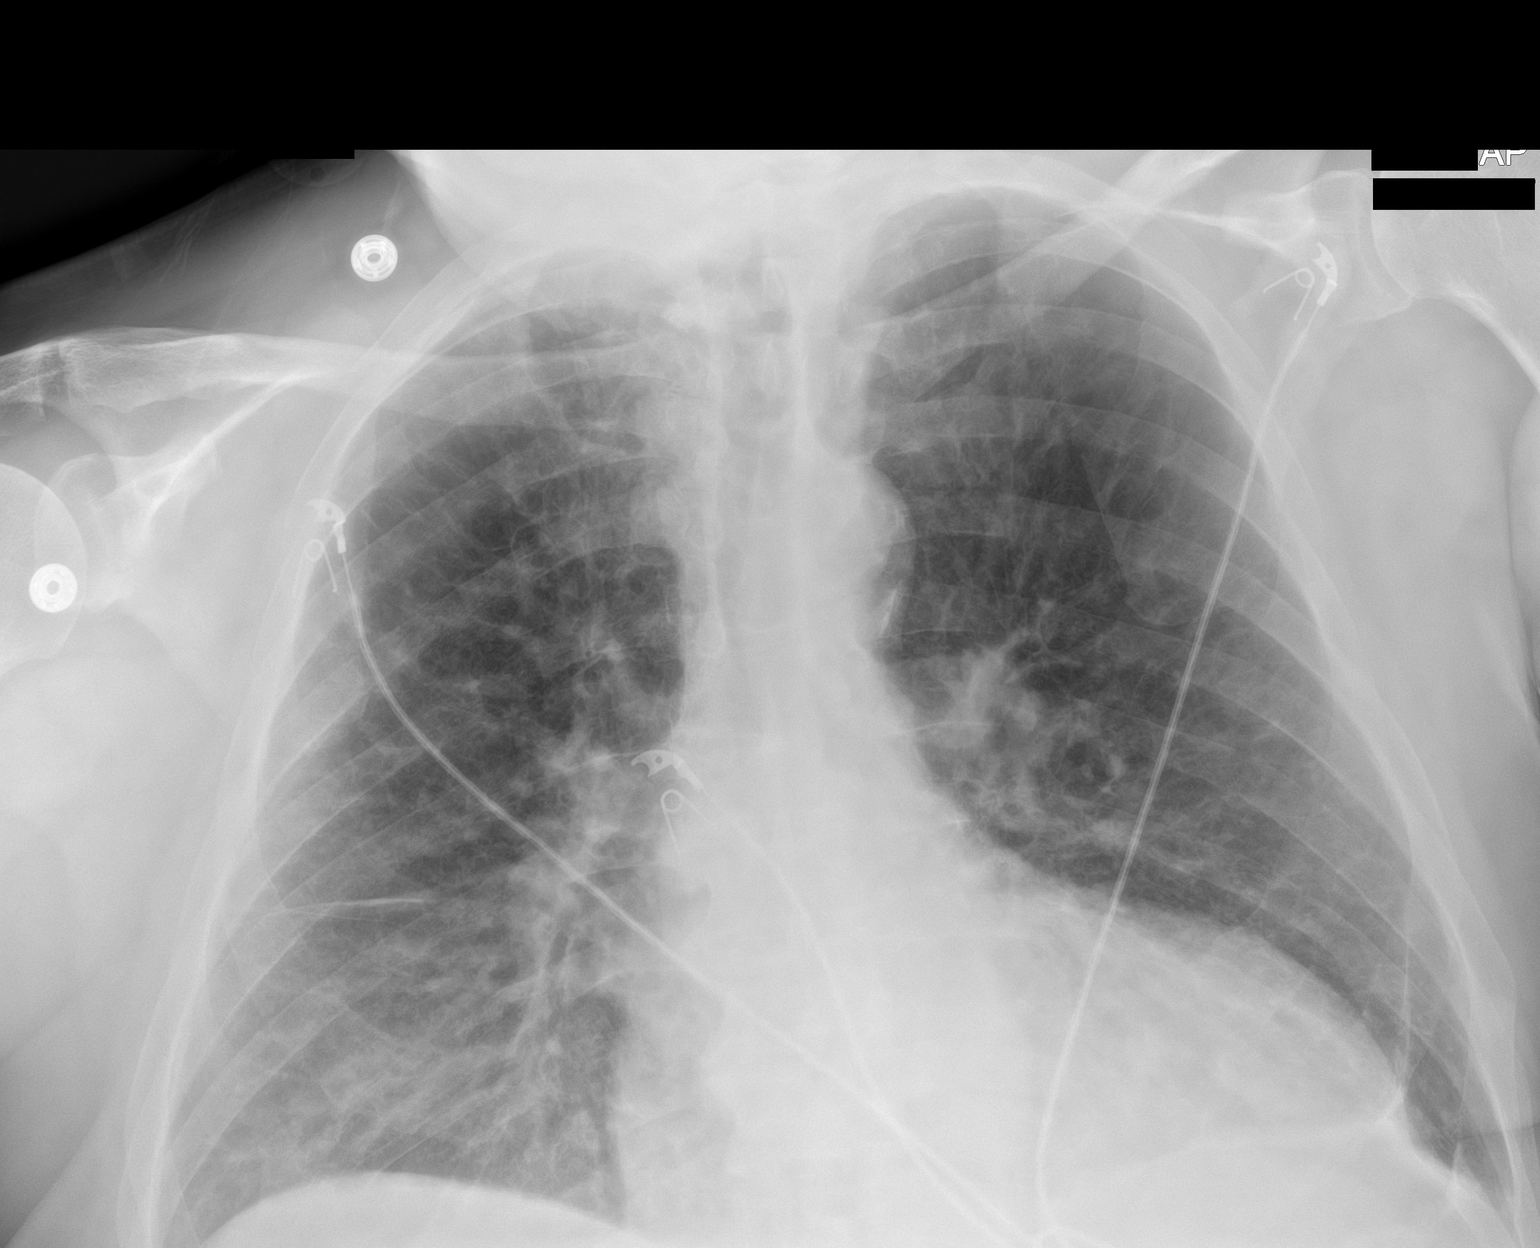

[chest ap (2 of 2)]
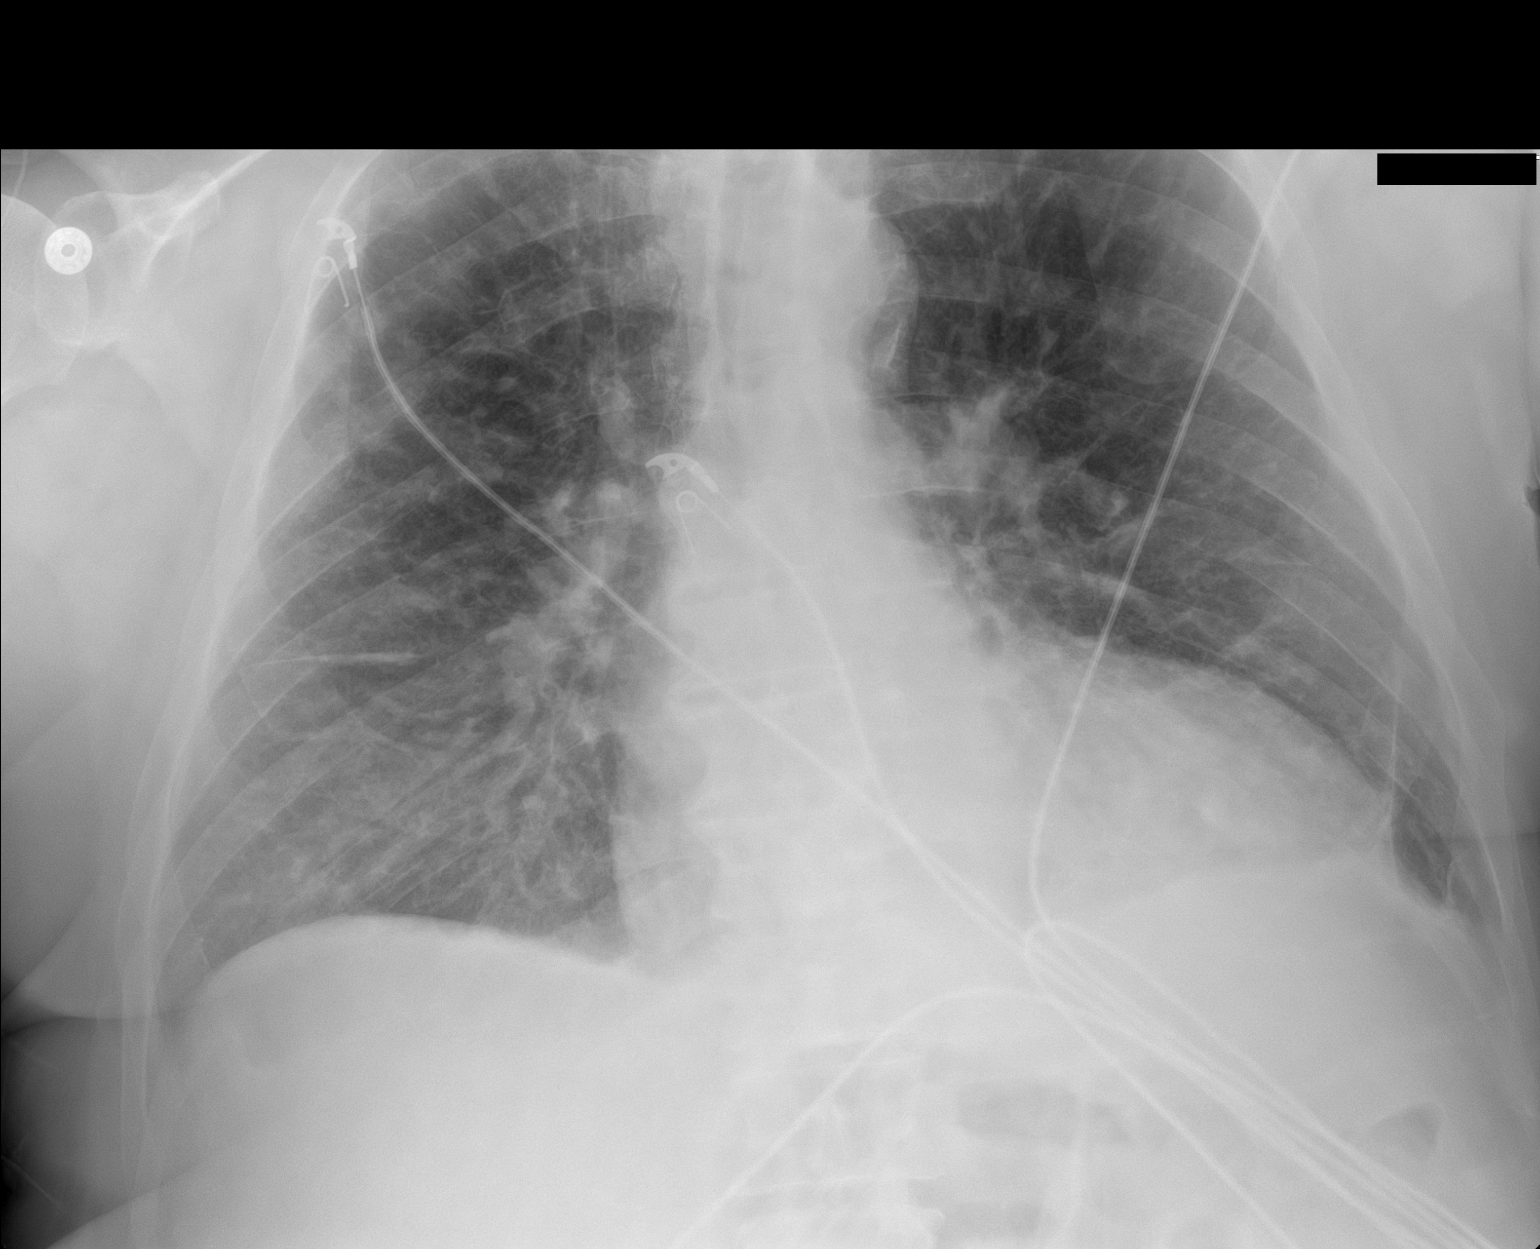

[2 of 2 positions shown; findings below may reference images not displayed]

FINDINGS: Cardiomegaly and interstitial opacity similar to prior. No definite
effusion. No air bronchograms or pneumothorax.
IMPRESSION: Interstitial opacity from emphysema and possibly mild edema.
Cardiomegaly.
